# Patient Record
Sex: Female | Born: 1960 | Race: Black or African American | Hispanic: No | Marital: Married | Smoking: Never smoker
Health system: Southern US, Community
[De-identification: ages and names within clinical notes are randomized; demographics above are authoritative.]

## PROBLEM LIST (undated history)

## (undated) DIAGNOSIS — M3119 Other thrombotic microangiopathy: Secondary | ICD-10-CM

## (undated) DIAGNOSIS — R011 Cardiac murmur, unspecified: Secondary | ICD-10-CM

## (undated) DIAGNOSIS — E739 Lactose intolerance, unspecified: Secondary | ICD-10-CM

## (undated) DIAGNOSIS — M329 Systemic lupus erythematosus, unspecified: Secondary | ICD-10-CM

## (undated) DIAGNOSIS — R7689 Other specified abnormal immunological findings in serum: Secondary | ICD-10-CM

## (undated) DIAGNOSIS — G459 Transient cerebral ischemic attack, unspecified: Secondary | ICD-10-CM

## (undated) DIAGNOSIS — R768 Other specified abnormal immunological findings in serum: Secondary | ICD-10-CM

## (undated) DIAGNOSIS — M311 Thrombotic microangiopathy: Secondary | ICD-10-CM

## (undated) DIAGNOSIS — G4733 Obstructive sleep apnea (adult) (pediatric): Secondary | ICD-10-CM

## (undated) DIAGNOSIS — K219 Gastro-esophageal reflux disease without esophagitis: Secondary | ICD-10-CM

## (undated) DIAGNOSIS — IMO0002 Reserved for concepts with insufficient information to code with codable children: Secondary | ICD-10-CM

## (undated) DIAGNOSIS — T7840XA Allergy, unspecified, initial encounter: Secondary | ICD-10-CM

## (undated) DIAGNOSIS — E785 Hyperlipidemia, unspecified: Secondary | ICD-10-CM

## (undated) HISTORY — DX: Other thrombotic microangiopathy: M31.19

## (undated) HISTORY — DX: Transient cerebral ischemic attack, unspecified: G45.9

## (undated) HISTORY — DX: Lactose intolerance, unspecified: E73.9

## (undated) HISTORY — DX: Other specified abnormal immunological findings in serum: R76.8

## (undated) HISTORY — DX: Obstructive sleep apnea (adult) (pediatric): G47.33

## (undated) HISTORY — DX: Thrombotic microangiopathy: M31.1

## (undated) HISTORY — DX: Gastro-esophageal reflux disease without esophagitis: K21.9

## (undated) HISTORY — DX: Hyperlipidemia, unspecified: E78.5

## (undated) HISTORY — DX: Other specified abnormal immunological findings in serum: R76.89

## (undated) HISTORY — PX: KNEE ARTHROSCOPY: SUR90

## (undated) HISTORY — PX: NASAL SEPTUM SURGERY: SHX37

## (undated) HISTORY — DX: Allergy, unspecified, initial encounter: T78.40XA

## (undated) HISTORY — DX: Cardiac murmur, unspecified: R01.1

---

## 1985-05-15 HISTORY — PX: SPLENECTOMY: SUR1306

## 1999-05-25 ENCOUNTER — Other Ambulatory Visit: Admission: RE | Admit: 1999-05-25 | Discharge: 1999-05-25 | Payer: Self-pay | Admitting: Internal Medicine

## 1999-11-23 ENCOUNTER — Other Ambulatory Visit: Admission: RE | Admit: 1999-11-23 | Discharge: 1999-11-23 | Payer: Self-pay | Admitting: Gynecology

## 2000-02-18 ENCOUNTER — Emergency Department (HOSPITAL_COMMUNITY): Admission: EM | Admit: 2000-02-18 | Discharge: 2000-02-18 | Payer: Self-pay | Admitting: Emergency Medicine

## 2001-06-18 ENCOUNTER — Ambulatory Visit (HOSPITAL_COMMUNITY): Admission: RE | Admit: 2001-06-18 | Discharge: 2001-06-18 | Payer: Self-pay | Admitting: Internal Medicine

## 2001-06-18 ENCOUNTER — Encounter: Payer: Self-pay | Admitting: Internal Medicine

## 2002-09-22 ENCOUNTER — Other Ambulatory Visit: Admission: RE | Admit: 2002-09-22 | Discharge: 2002-09-22 | Payer: Self-pay | Admitting: Internal Medicine

## 2003-04-05 ENCOUNTER — Emergency Department (HOSPITAL_COMMUNITY): Admission: EM | Admit: 2003-04-05 | Discharge: 2003-04-05 | Payer: Self-pay | Admitting: Emergency Medicine

## 2003-04-16 ENCOUNTER — Encounter: Admission: RE | Admit: 2003-04-16 | Discharge: 2003-04-16 | Payer: Self-pay | Admitting: Internal Medicine

## 2003-05-01 ENCOUNTER — Ambulatory Visit (HOSPITAL_BASED_OUTPATIENT_CLINIC_OR_DEPARTMENT_OTHER): Admission: RE | Admit: 2003-05-01 | Discharge: 2003-05-01 | Payer: Self-pay | Admitting: Orthopedic Surgery

## 2003-06-02 ENCOUNTER — Inpatient Hospital Stay (HOSPITAL_COMMUNITY): Admission: RE | Admit: 2003-06-02 | Discharge: 2003-06-03 | Payer: Self-pay | Admitting: Gynecology

## 2004-05-15 HISTORY — PX: ABDOMINAL HYSTERECTOMY: SHX81

## 2004-08-01 ENCOUNTER — Other Ambulatory Visit: Admission: RE | Admit: 2004-08-01 | Discharge: 2004-08-01 | Payer: Self-pay | Admitting: Gynecology

## 2005-04-04 ENCOUNTER — Ambulatory Visit (HOSPITAL_COMMUNITY): Admission: RE | Admit: 2005-04-04 | Discharge: 2005-04-04 | Payer: Self-pay | Admitting: Internal Medicine

## 2005-11-07 ENCOUNTER — Other Ambulatory Visit: Admission: RE | Admit: 2005-11-07 | Discharge: 2005-11-07 | Payer: Self-pay | Admitting: Internal Medicine

## 2006-04-09 ENCOUNTER — Ambulatory Visit (HOSPITAL_COMMUNITY): Admission: RE | Admit: 2006-04-09 | Discharge: 2006-04-09 | Payer: Self-pay | Admitting: Internal Medicine

## 2012-08-23 ENCOUNTER — Encounter: Payer: Self-pay | Admitting: Family Medicine

## 2012-08-23 ENCOUNTER — Ambulatory Visit (INDEPENDENT_AMBULATORY_CARE_PROVIDER_SITE_OTHER): Payer: Managed Care, Other (non HMO) | Admitting: Family Medicine

## 2012-08-23 VITALS — BP 120/70 | HR 79 | Temp 98.5°F | Ht 68.75 in | Wt 216.0 lb

## 2012-08-23 DIAGNOSIS — Z9089 Acquired absence of other organs: Secondary | ICD-10-CM

## 2012-08-23 DIAGNOSIS — Z9081 Acquired absence of spleen: Secondary | ICD-10-CM

## 2012-08-23 DIAGNOSIS — Z7189 Other specified counseling: Secondary | ICD-10-CM

## 2012-08-23 DIAGNOSIS — K5900 Constipation, unspecified: Secondary | ICD-10-CM

## 2012-08-23 DIAGNOSIS — Z7689 Persons encountering health services in other specified circumstances: Secondary | ICD-10-CM

## 2012-08-23 DIAGNOSIS — K5909 Other constipation: Secondary | ICD-10-CM

## 2012-08-23 NOTE — Patient Instructions (Addendum)
-  PLEASE SIGN UP FOR MYCHART TODAY   We recommend the following healthy lifestyle measures: - eat a healthy diet consisting of lots of vegetables, fruits, beans, nuts, seeds, healthy meats such as white chicken and fish and whole grains.  - avoid fried foods, fast food, processed foods, sodas, red meet and other fattening foods.  - get a least 150 minutes of aerobic exercise per week.   Schedule mammogram  Obtain vaccine records  For constipation: -daily fiber supplement (such as metameucil) -if get stopped up use mirilax for a few days to get moving -get regular exercise  Follow up in: sometime in the next 2-3 months for physical exam

## 2012-08-23 NOTE — Progress Notes (Signed)
Chief Complaint  Patient presents with  . Establish Care    HPI:  Deanna [name] is here to establish care. Moved to [facility] June 2013 from [place]. Now has insurance and wants to get back under care of PCP. Want to get well women exams here. Last pap smear in 2012 was normal.   Has the following chronic problems and concerns today:  There is no problem list on file for this patient.  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  Past Medical History  Diagnosis Date  . Lactose intolerance     Family History  Problem Relation Age of Onset  . Arthritis Mother   . Hyperlipidemia Mother   . Stroke Mother   . Hypertension Mother     History   Social History  . Marital Status: Married    Spouse Name: N/A    Number of Children: N/A  . Years of Education: N/A   Social History Main Topics  . Smoking status: Never Smoker   . Smokeless tobacco: None  . Alcohol Use: No  . Drug Use: None  . Sexually Active: None   Other Topics Concern  . None   Social History Narrative   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 Situation: lives 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, husband and mother      Spiritual Beliefs: Christian      Lifestyle: walks a little a few days per week, diet is ok             Current outpatient prescriptions:Probiotic Product (PROBIOTIC & ACIDOPHILUS EX ST) CAPS, Take by mouth daily., Disp: , Rfl:   EXAM:  Filed Vitals:   08/23/12 1100  BP: 120/70  Pulse: 79  Temp: 98.5 F (36.9 C)    Body mass index is 32.14 kg/(m^2).  GENERAL: vitals reviewed and listed above, alert, oriented, appears well hydrated and in no acute distress  HEENT:  atraumatic, conjunttiva clear, no obvious abnormalities on inspection of external nose and ears  NECK: no obvious masses on inspection  LUNGS: clear to auscultation bilaterally, no wheezes, rales or rhonchi, good air movement  CV: HRRR, no peripheral edema  ABD: BS+, soft, NTTP  MS: moves all extremities without noticeable abnormality  PSYCH: pleasant and cooperative, no obvious depression or anxiety  ASSESSMENT AND PLAN:  Discussed the following assessment and plan:  Constipation, chronic  H/O splenectomy  Encounter to establish care  -We reviewed the PMH, PSH, FH, SH, Meds and Allergies. -discussed vaccines for s/p splenectomy and offered today - she thinks may have had them and will get vaccine records -advised physical with fasting labs and she will return for this - she reports she has had paps since hysterectomy -recs for constipation per below -follow up for physical with pap  -Patient advised to return or notify a doctor immediately if symptoms worsen or persist or new concerns arise.  Patient Instructions  -PLEASE SIGN UP FOR MYCHART TODAY   We recommend the following healthy lifestyle measures: - eat a healthy diet consisting of lots of vegetables, fruits, beans, nuts, seeds, healthy meats such as white chicken and fish and  whole grains.  - avoid fried foods, fast food, processed foods, sodas, red meet and other fattening foods.  - get a least 150 minutes of aerobic exercise per week.   Schedule mammogram  Obtain vaccine records  For constipation: -daily fiber supplement (such as metameucil) -if get stopped up use mirilax for a few days to get moving -get regular exercise  Follow up in: sometime in the next 2-3 months for physical exam      KIM, HANNAH R.

## 2012-08-27 ENCOUNTER — Other Ambulatory Visit: Payer: Self-pay | Admitting: Family Medicine

## 2012-08-27 DIAGNOSIS — Z1231 Encounter for screening mammogram for malignant neoplasm of breast: Secondary | ICD-10-CM

## 2012-08-30 ENCOUNTER — Ambulatory Visit (HOSPITAL_COMMUNITY)
Admission: RE | Admit: 2012-08-30 | Discharge: 2012-08-30 | Disposition: A | Discharge status: Home / Self Care | Payer: Managed Care, Other (non HMO) | Source: Ambulatory Visit | Attending: Family Medicine | Admitting: Family Medicine

## 2012-08-30 DIAGNOSIS — Z1231 Encounter for screening mammogram for malignant neoplasm of breast: Secondary | ICD-10-CM | POA: Insufficient documentation

## 2012-09-11 ENCOUNTER — Encounter: Payer: Self-pay | Admitting: Family Medicine

## 2012-09-11 DIAGNOSIS — Z862 Personal history of diseases of the blood and blood-forming organs and certain disorders involving the immune mechanism: Secondary | ICD-10-CM

## 2012-09-11 DIAGNOSIS — E785 Hyperlipidemia, unspecified: Secondary | ICD-10-CM

## 2012-09-11 DIAGNOSIS — E559 Vitamin D deficiency, unspecified: Secondary | ICD-10-CM

## 2012-09-11 NOTE — Progress Notes (Signed)
Received basic labs and EKGs from 2011 and 2012. Has Non-specific TW changes, HLD, Vit D def and mildly elevated platelets chronically. Scanned into chart.

## 2012-10-04 ENCOUNTER — Other Ambulatory Visit (HOSPITAL_COMMUNITY)
Admission: RE | Admit: 2012-10-04 | Discharge: 2012-10-04 | Disposition: A | Discharge status: Home / Self Care | Payer: Managed Care, Other (non HMO) | Source: Ambulatory Visit | Attending: Family Medicine | Admitting: Family Medicine

## 2012-10-04 ENCOUNTER — Encounter: Payer: Self-pay | Admitting: Family Medicine

## 2012-10-04 ENCOUNTER — Ambulatory Visit (INDEPENDENT_AMBULATORY_CARE_PROVIDER_SITE_OTHER): Payer: Managed Care, Other (non HMO) | Admitting: Family Medicine

## 2012-10-04 VITALS — BP 130/80 | Temp 98.4°F | Wt 213.0 lb

## 2012-10-04 DIAGNOSIS — Z299 Encounter for prophylactic measures, unspecified: Secondary | ICD-10-CM

## 2012-10-04 DIAGNOSIS — Z0000 Encounter for general adult medical examination without abnormal findings: Secondary | ICD-10-CM

## 2012-10-04 DIAGNOSIS — E559 Vitamin D deficiency, unspecified: Secondary | ICD-10-CM

## 2012-10-04 DIAGNOSIS — E785 Hyperlipidemia, unspecified: Secondary | ICD-10-CM

## 2012-10-04 DIAGNOSIS — L821 Other seborrheic keratosis: Secondary | ICD-10-CM

## 2012-10-04 DIAGNOSIS — Z862 Personal history of diseases of the blood and blood-forming organs and certain disorders involving the immune mechanism: Secondary | ICD-10-CM

## 2012-10-04 DIAGNOSIS — Z01419 Encounter for gynecological examination (general) (routine) without abnormal findings: Secondary | ICD-10-CM | POA: Insufficient documentation

## 2012-10-04 DIAGNOSIS — Z1151 Encounter for screening for human papillomavirus (HPV): Secondary | ICD-10-CM | POA: Insufficient documentation

## 2012-10-04 LAB — CBC WITH DIFFERENTIAL/PLATELET
Basophils Absolute: 0.1 10*3/uL (ref 0.0–0.1)
Basophils Relative: 0.9 % (ref 0.0–3.0)
Eosinophils Absolute: 0.1 10*3/uL (ref 0.0–0.7)
Eosinophils Relative: 1.3 % (ref 0.0–5.0)
HCT: 36 % (ref 36.0–46.0)
Hemoglobin: 12.2 g/dL (ref 12.0–15.0)
Lymphocytes Relative: 33.8 % (ref 12.0–46.0)
Lymphs Abs: 2.4 10*3/uL (ref 0.7–4.0)
MCHC: 33.9 g/dL (ref 30.0–36.0)
MCV: 85.7 fl (ref 78.0–100.0)
Monocytes Absolute: 0.7 10*3/uL (ref 0.1–1.0)
Monocytes Relative: 9.2 % (ref 3.0–12.0)
Neutro Abs: 3.9 10*3/uL (ref 1.4–7.7)
Neutrophils Relative %: 54.8 % (ref 43.0–77.0)
Platelets: 417 10*3/uL — ABNORMAL HIGH (ref 150.0–400.0)
RBC: 4.21 Mil/uL (ref 3.87–5.11)
RDW: 14.3 % (ref 11.5–14.6)
WBC: 7.2 10*3/uL (ref 4.5–10.5)

## 2012-10-04 LAB — LIPID PANEL
Cholesterol: 209 mg/dL — ABNORMAL HIGH (ref 0–200)
HDL: 44.1 mg/dL (ref >39.00)
Total CHOL/HDL Ratio: 5
Triglycerides: 79 mg/dL (ref 0.0–149.0)
VLDL: 15.8 mg/dL (ref 0.0–40.0)

## 2012-10-04 LAB — BASIC METABOLIC PANEL
BUN: 8 mg/dL (ref 6–23)
CO2: 28 mEq/L (ref 19–32)
Calcium: 9.4 mg/dL (ref 8.4–10.5)
Chloride: 104 mEq/L (ref 96–112)
Creatinine, Ser: 0.8 mg/dL (ref 0.4–1.2)
GFR: 93.7 mL/min (ref >60.00)
Glucose, Bld: 75 mg/dL (ref 70–99)
Potassium: 3.6 mEq/L (ref 3.5–5.1)
Sodium: 140 mEq/L (ref 135–145)

## 2012-10-04 LAB — HEMOGLOBIN A1C: Hgb A1c MFr Bld: 5.9 % (ref 4.6–6.5)

## 2012-10-04 LAB — LDL CHOLESTEROL, DIRECT: Direct LDL: 150.4 mg/dL

## 2012-10-04 NOTE — Patient Instructions (Signed)
-  We have ordered labs or studies at this visit. It can take up to 1-2 weeks for results and processing. We will contact you with instructions IF your results are abnormal. Normal results will be released to your [facility]. If you have not heard from [place] or can not find your results in [facility] in 2 weeks please contact our office.  -PLEASE SIGN UP FOR MYCHART TODAY   We recommend the following healthy lifestyle measures: - eat a healthy diet consisting of lots of vegetables, fruits, beans, nuts, seeds, healthy meats such as white chicken and fish and whole grains.  - avoid fried foods, fast food, processed foods, sodas, red meet and other fattening foods.  - get a least 150 minutes of aerobic exercise per week.   Take vitamin D 1000 IU daily  Follow up in: 1 year or sooner if needed

## 2012-10-04 NOTE — Progress Notes (Signed)
Chief Complaint  Patient presents with  . Annual Exam    HPI:  Here for CPE:  -Concerns today:   1) few moles she wants [place] to look at: -right leg and R side -feels like mole on R side has changed some  2) wants labs, hx HLD and Vit D def  -Diet: trying to eat healthy, lots of fruits  -Calcium and Vit D: hx vit D def,does not take any vitamin D  -Exercise: walks a few days per week  -Diabetes and Dyslipidemia Screening: Hx HLD mild  -Hx of HTN: no  -Vaccines: UTD  -pap history: last in 2012 and normal per her report, of note she is s/p hysterectomy for fibroids but reports has still gotten paps, does not know if has cervix, paps have all been normal, no hx abnormal paps  -FDLMP: N/A  -sexual activity: yes, female partner, no new partners  -wants STI testing: no  -FH breast, colon or ovarian ca: see FH -had mammo 08/2012  -Alcohol, Tobacco, drug use: see social history  Review of Systems - vision changes, unintentional weight loss, fevers, CP, SOB, DOE, pain, changes in bowels, dysuria, vaginal discharge, bleeding, skin rashes, syncope, depression  Past Medical History  Diagnosis Date  . Lactose intolerance     Family History  Problem Relation Age of Onset  . Arthritis Mother   . Hyperlipidemia Mother   . Stroke Mother   . Hypertension Mother     History   Social History  . Marital Status: Married    Spouse Name: N/A    Number of Children: N/A  . Years of Education: N/A   Social History Main Topics  . Smoking status: Never Smoker   . Smokeless tobacco: None  . Alcohol Use: No  . Drug Use: None  . Sexually Active: None   Other Topics Concern  . None   Social History Narrative   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 Situation: lives 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, husband and mother      Spiritual Beliefs: Christian      Lifestyle: walks a little a few days per week, diet is ok             Current outpatient  prescriptions:Multiple Vitamins-Minerals (ULTRA MEGA PO), Take by mouth. Green ; Whole Food Enhanced multivitamin, Disp: , Rfl: ;  Probiotic Product (PROBIOTIC & ACIDOPHILUS EX ST) CAPS, Take by mouth daily., Disp: , Rfl:   EXAM:  Filed Vitals:   10/04/12 1124  BP: 130/80  Temp: 98.4 F (36.9 C)    GENERAL: vitals reviewed and listed below, alert, oriented, appears well hydrated and in no acute distress  HEENT: head atraumatic, PERRLA, normal appearance of eyes, ears, nose and mouth. moist mucus membranes.  NECK: supple, no masses or lymphadenopathy  LUNGS: clear to auscultation bilaterally, no rales, rhonchi or wheeze  CV: HRRR, no peripheral edema or cyanosis, normal pedal pulses  BREAST: normal appearance - no lesions or discharge, on palpation normal breast tissue without any suspicious masses  ABDOMEN: bowel sounds normal, soft, non tender to palpation, no masses, no rebound or guarding  GU: normal appearance of external genitalia - no lesions or masses, normal vaginal mucosa - no abnormal discharge, appears to have possible cervix or partial cervix R - ap obtained  RECTAL: refused  SKIN: no rash or abnormal lesions, SK side and leg  MS: normal gait, moves all extremities normally  NEURO: CN II-XII grossly  intact, normal muscle strength and sensation to light touch on extremities  PSYCH: normal affect, pleasant and cooperative  ASSESSMENT AND PLAN:  Discussed the following assessment and plan:  Preventive measure - Plan: Lipid Panel, Hemoglobin A1c, Basic metabolic panel  Seborrheic keratosis  Unspecified vitamin D deficiency - Plan: Vitamin D, 25-hydroxy  Hyperlipemia - from 2011, 2012 labs - Plan: Lipid Panel  History of high platelet count - on labs 2011, 2012 - Plan: CBC with Differential  -Discussed and advised all [place] preventive services health task force level A and B recommendations for age, sex and risks.  -FASTING LABS  -offered tx for SKs,  deferred  -Advised at least 150 minutes of exercise per week and a healthy diet low in saturated fats and sweets and consisting of fresh fruits and vegetables, lean meats such as fish and white chicken and whole grains.  -labs, studies and vaccines per orders this encounter  Orders Placed This Encounter  Procedures  . Lipid Panel  . Hemoglobin A1c  . Vitamin D, 25-hydroxy  . CBC with Differential  . Basic metabolic panel    Patient Instructions  -We have ordered labs or studies at this visit. It can take up to 1-2 weeks for results and processing. We will contact you with instructions IF your results are abnormal. Normal results will be released to your [facility]. If you have not heard from [place] or can not find your results in [facility] in 2 weeks please contact our office.  -PLEASE SIGN UP FOR MYCHART TODAY   We recommend the following healthy lifestyle measures: - eat a healthy diet consisting of lots of vegetables, fruits, beans, nuts, seeds, healthy meats such as white chicken and fish and whole grains.  - avoid fried foods, fast food, processed foods, sodas, red meet and other fattening foods.  - get a least 150 minutes of aerobic exercise per week.   Take vitamin D 1000 IU daily  Follow up in: 1 year or sooner if needed     Patient advised to return to clinic immediately if symptoms worsen or persist or new concerns.    Return in about 1 year (around 10/04/2013), or if symptoms worsen or fail to improve.  Kriste Basque R.

## 2012-10-05 LAB — VITAMIN D 25 HYDROXY (VIT D DEFICIENCY, FRACTURES): Vit D, 25-Hydroxy: 20 ng/mL — ABNORMAL LOW (ref 30–89)

## 2012-10-08 NOTE — Progress Notes (Signed)
Quick Note:  Called and spoke with pt and pt is aware. Pt will have to call office back to schedule f/u. ______

## 2012-10-11 NOTE — Progress Notes (Signed)
Quick Note:  Left a message for pt to return call. ______ 

## 2012-10-15 NOTE — Progress Notes (Signed)
Quick Note:  Called and spoke with pt and pt is aware. ______ 

## 2013-02-28 ENCOUNTER — Encounter: Payer: Self-pay | Admitting: Internal Medicine

## 2013-02-28 ENCOUNTER — Ambulatory Visit (INDEPENDENT_AMBULATORY_CARE_PROVIDER_SITE_OTHER): Payer: Managed Care, Other (non HMO) | Admitting: Internal Medicine

## 2013-02-28 VITALS — BP 130/80 | HR 65 | Temp 98.5°F | Resp 20 | Wt 216.0 lb

## 2013-02-28 DIAGNOSIS — K120 Recurrent oral aphthae: Secondary | ICD-10-CM

## 2013-02-28 MED ORDER — FIRST-DUKES MOUTHWASH MT SUSP: 5.0000 mL | Freq: Four times a day (QID) | OROMUCOSAL | Status: DC | PRN

## 2013-02-28 NOTE — Progress Notes (Signed)
Subjective:    Patient ID: Deanna [name], female    DOB: [birth year], 52 y.o.   MRN: [id number]  HPI  52 year old patient who presents with a one-week history of some discomfort involving her left facial area and ear. She was concerned about a possible ear infection. No fever or other constitutional complaints. She is a nonsmoker. She has had some fever blisters in the past involving her nose. Past Medical History  Diagnosis Date  . Lactose intolerance     History   Social History  . Marital Status: Married    Spouse Name: N/A    Number of Children: N/A  . Years of Education: N/A   Occupational History  . Not on file.   Social History Main Topics  . Smoking status: Never Smoker   . Smokeless tobacco: Not on file  . Alcohol Use: No  . Drug Use: Not on file  . Sexual Activity: Not on file   Other Topics Concern  . 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 Situation: lives 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, husband and mother      Spiritual Beliefs: Christian      Lifestyle: walks a little a few days per week, diet is ok             Past Surgical History  Procedure Laterality Date  . 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 Abdominal hysterectomy  2006    benign tumor, complete  . Knee surgery      Family History  Problem Relation Age of Onset  . Arthritis Mother   . Hyperlipidemia Mother   . Stroke Mother   . Hypertension Mother     Allergies  Allergen Reactions  . Eggs Or Egg-Derived Products Nausea And Vomiting    Current Outpatient Prescriptions on File Prior to Visit  Medication Sig Dispense Refill  . Multiple Vitamins-Minerals (ULTRA MEGA PO) Take by mouth. Green ; Whole Food Enhanced multivitamin      . Probiotic Product (PROBIOTIC & ACIDOPHILUS EX ST) CAPS Take by mouth daily.       No current facility-administered medications on file prior to visit.    BP 130/80  Pulse 65  Temp(Src)  98.5 F (36.9 C) (Oral)  Resp 20  Wt 216 lb (97.977 kg)  BMI 32.14 kg/m2  SpO2 98%        Review of Systems  Constitutional: Negative.   HENT: Positive for ear pain. Negative for congestion, dental problem, hearing loss, rhinorrhea, sinus pressure, sore throat and tinnitus.   Eyes: Negative for pain, discharge and visual disturbance.  Respiratory: Negative for cough and shortness of breath.   Cardiovascular: Negative for chest pain, palpitations and leg swelling.  Gastrointestinal: Negative for nausea, vomiting, abdominal pain, diarrhea, constipation, blood in stool and abdominal distention.  Genitourinary: Negative for dysuria, urgency, frequency, hematuria, flank pain, vaginal bleeding, vaginal discharge, difficulty urinating, vaginal pain and pelvic pain.  Musculoskeletal: Negative for arthralgias, gait problem and joint swelling.  Skin: Negative for rash.  Neurological: Negative for dizziness, syncope, speech difficulty, weakness, numbness and headaches.  Hematological: Negative for adenopathy.  Psychiatric/Behavioral: Negative for behavioral problems, dysphoric mood and agitation. The patient is not nervous/anxious.        Objective:   Physical Exam  Constitutional: She is oriented to person, place, and time. She appears well-developed and well-nourished.  HENT:  Head: Normocephalic.  Right Ear: External ear normal.  Left Ear: External ear normal.  Mouth/Throat: Oropharynx is clear and moist.  Small aphthous ulcer underneath the left lateral tongue area  Eyes: Conjunctivae and EOM are normal. Pupils are equal, round, and reactive to light.  Neck: Normal range of motion. Neck supple. No thyromegaly present.  Shoddy mildly tender left submandibular adenopathy  Cardiovascular: Normal rate, regular rhythm, normal heart sounds and intact distal pulses.   Pulmonary/Chest: Effort normal and breath sounds normal.  Abdominal: Soft. Bowel sounds are normal. She exhibits no mass.  There is no tenderness.  Musculoskeletal: Normal range of motion.  Lymphadenopathy:    She has cervical adenopathy.  Neurological: She is alert and oriented to person, place, and time.  Skin: Skin is warm and dry. No rash noted.  Psychiatric: She has a normal mood and affect. Her behavior is normal.          Assessment & Plan:  Canker sores- will treat symptomatically

## 2013-02-28 NOTE — Patient Instructions (Signed)
Canker Sores   Canker sores are painful, open sores on the inside of the mouth and cheek. They may be white or yellow. The sores usually heal in 1 to 2 weeks. Women are more likely than men to have recurrent canker sores.  CAUSES  The cause of canker sores is not well understood. More than one cause is likely. Canker sores do not appear to be caused by certain types of germs (viruses or bacteria). Canker sores may be caused by:   An allergic reaction to certain foods.   Digestive problems.   Not having enough vitamin B12, folic acid, and iron.   Female sex hormones. Sores may come only during certain phases of a menstrual cycle. Often, there is improvement during pregnancy.   Genetics. Some people seem to inherit canker sore problems.  Emotional stress and injuries to the mouth may trigger outbreaks, but not cause them.   DIAGNOSIS  Canker sores are diagnosed by exam.   TREATMENT   Patients who have frequent bouts of canker sores may have cultures taken of the sores, blood tests, or allergy tests. This helps determine if their sores are caused by a poor diet, an allergy, or some other preventable or treatable disease.   Vitamins may prevent recurrences or reduce the severity of canker sores in people with poor nutrition.   Numbing ointments can relieve pain. These are available in drug stores without a prescription.   Anti-inflammatory steroid mouth rinses or gels may be prescribed by your caregiver for severe sores.   Oral steroids may be prescribed if you have severe, recurrent canker sores. These strong medicines can cause many side effects and should be used only under the close direction of a dentist or physician.   Mouth rinses containing the antibiotic medicine may be prescribed. They may lessen symptoms and speed healing.  Healing usually happens in about 1 or 2 weeks with or without treatment. Certain antibiotic mouth rinses given to pregnant women and young children can permanently stain teeth.  Talk to your caregiver about your treatment.  HOME CARE INSTRUCTIONS    Avoid foods that cause canker sores for you.   Avoid citrus juices, spicy or salty foods, and coffee until the sores are healed.   Use a soft-bristled toothbrush.   Chew your food carefully to avoid biting your cheek.   Apply topical numbing medicine to the sore to help relieve pain.   Apply a thin paste of baking soda and water to the sore to help heal the sore.   Only use mouth rinses or medicines for pain or discomfort as directed by your caregiver.  SEEK MEDICAL CARE IF:    Your symptoms are not better in 1 week.   Your sores are still present after 2 weeks.   Your sores are very painful.   You have trouble breathing or swallowing.   Your sores come back frequently.  Document Released: 08/26/2010 Document Revised: 07/24/2011 Document Reviewed: 08/26/2010  [employer] Patient Information 2014 [employer].

## 2013-03-20 ENCOUNTER — Other Ambulatory Visit: Payer: Self-pay

## 2013-04-29 ENCOUNTER — Ambulatory Visit (INDEPENDENT_AMBULATORY_CARE_PROVIDER_SITE_OTHER): Payer: Managed Care, Other (non HMO) | Admitting: Family Medicine

## 2013-04-29 VITALS — BP 108/74 | HR 81 | Temp 98.8°F | Wt 218.0 lb

## 2013-04-29 DIAGNOSIS — J329 Chronic sinusitis, unspecified: Secondary | ICD-10-CM

## 2013-04-29 MED ORDER — AMOXICILLIN 875 MG PO TABS: 875.0000 mg | ORAL_TABLET | Freq: Two times a day (BID) | ORAL | Status: DC

## 2013-04-29 NOTE — Progress Notes (Signed)
Chief Complaint  Patient presents with  . Cough    congestion, hoarseness, not sleeping due to cough, sinus pain and pressure x 2 weeks     HPI:  -started: 2 weeks ago -symptoms:nasal congestion, sore throat, cough, sinus pain and pressure - not getting better -denies:fever, SOB, NVD, tooth pain -has tried: OTC remedies -sick contacts/travel/risks: denies flu exposure, tick exposure or or Ebola risks   ROS: See pertinent positives and negatives per HPI.  Past Medical History  Diagnosis Date  . Lactose intolerance     Past Surgical History  Procedure Laterality Date  . 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 Abdominal hysterectomy  2006    benign tumor, complete  . Knee surgery      Family History  Problem Relation Age of Onset  . Arthritis Mother   . Hyperlipidemia Mother   . Stroke Mother   . Hypertension Mother     History   Social History  . Marital Status: Married    Spouse Name: N/A    Number of Children: N/A  . Years of Education: N/A   Social History Main Topics  . Smoking status: Never Smoker   . Smokeless tobacco: Not on file  . Alcohol Use: No  . Drug Use: Not on file  . Sexual Activity: Not on file   Other Topics Concern  . 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 Situation: lives 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, husband and mother      Spiritual Beliefs: Christian      Lifestyle: walks a little a few days per week, diet is ok             Current outpatient prescriptions:Diphenhyd-Hydrocort-Nystatin (FIRST-DUKES MOUTHWASH) SUSP, Use as directed 5 mLs in the mouth or throat every 6 (six) hours as needed., Disp: 237 mL, Rfl: 1;  Multiple Vitamins-Minerals (ULTRA MEGA PO), Take by mouth. Green ; Whole Food Enhanced multivitamin, Disp: , Rfl: ;  Probiotic Product (PROBIOTIC & ACIDOPHILUS EX ST) CAPS, Take by mouth daily., Disp: , Rfl:  amoxicillin (AMOXIL) 875 MG tablet, Take 1 tablet (875  mg total) by mouth 2 (two) times daily., Disp: 20 tablet, Rfl: 0  EXAM:  Filed Vitals:   04/29/13 1410  BP: 108/74  Pulse: 81  Temp: 98.8 F (37.1 C)    Body mass index is 32.44 kg/(m^2).  GENERAL: vitals reviewed and listed above, alert, oriented, appears well hydrated and in no acute distress  HEENT: atraumatic, conjunttiva clear, no obvious abnormalities on inspection of external nose and ears, normal appearance of ear canals and TMs, clear nasal congestion, mild post oropharyngeal erythema with PND, no tonsillar edema or exudate, no sinus TTP  NECK: no obvious masses on inspection  LUNGS: clear to auscultation bilaterally, no wheezes, rales or rhonchi, good air movement  CV: HRRR, no peripheral edema  MS: moves all extremities without noticeable abnormality  PSYCH: pleasant and cooperative, no obvious depression or anxiety  ASSESSMENT AND PLAN:  Discussed the following assessment and plan:  Sinusitis - Plan: amoxicillin (AMOXIL) 875 MG tablet  -given HPI and exam findings today, a serious infection or illness is unlikely. We discussed treatment side effects, likely course, antibiotic misuse, transmission, and signs of developing a serious illness. -of course, we advised to return or notify a doctor immediately if symptoms worsen or persist or new concerns arise.    There  are no Patient Instructions on file for this visit.   Colin Benton R.

## 2013-09-16 ENCOUNTER — Other Ambulatory Visit: Payer: Self-pay | Admitting: Family Medicine

## 2013-09-16 DIAGNOSIS — Z1231 Encounter for screening mammogram for malignant neoplasm of breast: Secondary | ICD-10-CM

## 2013-09-18 ENCOUNTER — Ambulatory Visit (HOSPITAL_COMMUNITY)
Admission: RE | Admit: 2013-09-18 | Discharge: 2013-09-18 | Disposition: A | Discharge status: Home / Self Care | Payer: Managed Care, Other (non HMO) | Source: Ambulatory Visit | Attending: Family Medicine | Admitting: Family Medicine

## 2013-09-18 DIAGNOSIS — Z1231 Encounter for screening mammogram for malignant neoplasm of breast: Secondary | ICD-10-CM | POA: Insufficient documentation

## 2013-09-22 ENCOUNTER — Other Ambulatory Visit: Payer: Self-pay | Admitting: Family Medicine

## 2013-09-22 DIAGNOSIS — R928 Other abnormal and inconclusive findings on diagnostic imaging of breast: Secondary | ICD-10-CM

## 2013-10-02 ENCOUNTER — Ambulatory Visit
Admission: RE | Admit: 2013-10-02 | Discharge: 2013-10-02 | Disposition: A | Discharge status: Home / Self Care | Payer: Managed Care, Other (non HMO) | Source: Ambulatory Visit | Attending: Family Medicine | Admitting: Family Medicine

## 2013-10-02 DIAGNOSIS — R928 Other abnormal and inconclusive findings on diagnostic imaging of breast: Secondary | ICD-10-CM

## 2014-01-27 ENCOUNTER — Ambulatory Visit (INDEPENDENT_AMBULATORY_CARE_PROVIDER_SITE_OTHER): Payer: Managed Care, Other (non HMO) | Admitting: Family Medicine

## 2014-01-27 ENCOUNTER — Encounter: Payer: Self-pay | Admitting: Family Medicine

## 2014-01-27 VITALS — BP 122/84 | HR 63 | Temp 98.4°F | Ht 68.25 in | Wt 220.0 lb

## 2014-01-27 DIAGNOSIS — M79609 Pain in unspecified limb: Secondary | ICD-10-CM

## 2014-01-27 DIAGNOSIS — E669 Obesity, unspecified: Secondary | ICD-10-CM | POA: Insufficient documentation

## 2014-01-27 DIAGNOSIS — E559 Vitamin D deficiency, unspecified: Secondary | ICD-10-CM

## 2014-01-27 DIAGNOSIS — M79644 Pain in right finger(s): Secondary | ICD-10-CM

## 2014-01-27 DIAGNOSIS — Z0000 Encounter for general adult medical examination without abnormal findings: Secondary | ICD-10-CM

## 2014-01-27 DIAGNOSIS — R7309 Other abnormal glucose: Secondary | ICD-10-CM

## 2014-01-27 DIAGNOSIS — E785 Hyperlipidemia, unspecified: Secondary | ICD-10-CM

## 2014-01-27 DIAGNOSIS — R739 Hyperglycemia, unspecified: Secondary | ICD-10-CM | POA: Insufficient documentation

## 2014-01-27 LAB — BASIC METABOLIC PANEL
BUN: 7 mg/dL (ref 6–23)
CO2: 30 mEq/L (ref 19–32)
Calcium: 9.4 mg/dL (ref 8.4–10.5)
Chloride: 103 mEq/L (ref 96–112)
Creatinine, Ser: 0.8 mg/dL (ref 0.4–1.2)
GFR: 98.67 mL/min (ref >60.00)
Glucose, Bld: 72 mg/dL (ref 70–99)
Potassium: 3.8 mEq/L (ref 3.5–5.1)
Sodium: 140 mEq/L (ref 135–145)

## 2014-01-27 LAB — LIPID PANEL
Cholesterol: 207 mg/dL — ABNORMAL HIGH (ref 0–200)
HDL: 35.3 mg/dL — ABNORMAL LOW (ref >39.00)
LDL Cholesterol: 143 mg/dL — ABNORMAL HIGH (ref 0–99)
NonHDL: 171.7
Total CHOL/HDL Ratio: 6
Triglycerides: 143 mg/dL (ref 0.0–149.0)
VLDL: 28.6 mg/dL (ref 0.0–40.0)

## 2014-01-27 LAB — HEMOGLOBIN A1C: Hgb A1c MFr Bld: 6 % (ref 4.6–6.5)

## 2014-01-27 NOTE — Patient Instructions (Addendum)
-  We have ordered labs or studies at this visit. It can take up to 1-2 weeks for results and processing. We will contact you with instructions IF your results are abnormal. Normal results will be released to your [facility]. If you have not heard from [place] or can not find your results in [facility] in 2 weeks please contact our office.  We recommend the following healthy lifestyle measures: - eat a healthy diet consisting of lots of vegetables, fruits, beans, nuts, seeds, healthy meats such as white chicken and fish and whole grains.  - avoid fried foods, fast food, processed foods, sodas, red meet and other fattening foods.  - get a least 150 minutes of aerobic exercise per week.   Cock up brace for the R wrist at night and during the day if tolerated. Take brakes frequently at work and ensure desk is at proper height  -We placed a referral for you as discussed for the colonoscopy. It usually takes about 1-2 weeks to process and schedule this referral. If you have not heard from [place] regarding this appointment in 2 weeks please contact our office.   Follow up in: 3-6 months or as needed

## 2014-01-27 NOTE — Progress Notes (Signed)
No chief complaint on file.   HPI:  Here for CPE:  -Concerns and/or follow up today:   1)Prediabetes/HLD: -diet and exercise advised -Diet: variety of foods, balance and well rounded, larger portion sizes -Exercise: walking 30 minutes twice per week; diet is healthy -denies:polyuria, polydipsia, vision changes  2)Vit D insuffieciency: -advised oral Vit D -reports: not taking vit D -denies: muscle pain, fatigue, kidney stones  3)R thumb and forearm pain: -achy pain for about 1 month -worse when she wakes up and after long day at work -no weakness or numbness -does a lot of computer work, r handed  -Taking folic acid, vitamin D or calcium: no  -Diabetes and Dyslipidemia Screening: doing today  -Hx of HTN: no  -Vaccines: UTD  -pap history:  last pap 09/2012 incomplete - however on further review of chart had complete hysterectomy so pap not indicated - she reports her prior gynecologist had advised did not need paps.  -FDLMP: N/A  -sexual activity: yes, female partner, no new partners  -wants STI testing: no  -FH breast, colon or ovarian ca: see FH Last mammogram: 09/2014 Last colon cancer screening: wants to get referral for colonoscopy  Breast Ca Risk Assessment: -no personal or FH of breast cancer or ovarian cancer  -Alcohol, Tobacco, drug use: see social history  Review of Systems - no fevers, unintentional weight loss, vision loss, hearing loss, chest pain, sob, hemoptysis, melena, hematochezia, hematuria, genital discharge, changing or concerning skin lesions, bleeding, bruising, loc, thoughts of self harm or SI  Past Medical History  Diagnosis Date  . Lactose intolerance     Past Surgical History  Procedure Laterality Date  . 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 Abdominal hysterectomy  2006    benign tumor, complete  . Knee surgery      Family History  Problem Relation Age of Onset  . Arthritis Mother   . Hyperlipidemia Mother   . Stroke  Mother   . Hypertension Mother     History   Social History  . Marital Status: Married    Spouse Name: N/A    Number of Children: N/A  . Years of Education: N/A   Social History Main Topics  . Smoking status: Never Smoker   . Smokeless tobacco: None  . Alcohol Use: No  . Drug Use: None  . Sexual Activity: None   Other Topics Concern  . None   Social History Narrative   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: lives 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, husband and mother      Spiritual Beliefs: Christian      Lifestyle: walks a little a few days per week, diet is ok             Current outpatient prescriptions:Probiotic Product (PROBIOTIC & ACIDOPHILUS EX ST) CAPS, Take by mouth daily., Disp: , Rfl:   EXAM:  Filed Vitals:   01/27/14 1143  BP: 122/84  Pulse: 63  Temp: 98.4 F (36.9 C)    GENERAL: vitals reviewed and listed below, alert, oriented, appears well hydrated and in no acute distress  HEENT: head atraumatic, PERRLA, normal appearance of eyes, ears, nose and mouth. moist mucus membranes.  NECK: supple, no masses or lymphadenopathy  LUNGS: clear to auscultation bilaterally, no rales, rhonchi or wheeze  CV: HRRR, no peripheral edema or cyanosis, normal pedal pulses  BREAST: declined  ABDOMEN: bowel sounds normal, soft, non tender to palpation, no  masses, no rebound or guarding  GU: declined  RECTAL: refused  SKIN: no rash or abnormal lesions  MS: normal gait, moves all extremities normally  NEURO: CN II-XII grossly intact, normal muscle strength and sensation to light touch on extremities  PSYCH: normal affect, pleasant and cooperative  ASSESSMENT AND PLAN:  Discussed the following assessment and plan:  1. Visit for preventive health examination -Discussed and advised all [place] preventive services health task force level A and B recommendations for age, sex and risks. -Advised at least 150 minutes of exercise per week and a  healthy diet low in saturated fats and sweets and consisting of fresh fruits and vegetables, lean meats such as fish and white chicken and whole grains. 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 Unspecified vitamin D deficiency -advised Vt D3 1000 IU daily  3. Hyperlipemia - from 2011, 2012 labs -FASTING labs today -healthy diet and exercise  4. Hyperglycemia -labs today -healthy diet and exercise  5. Thumb pain, right -likely CTS -wrist cock up brace applied and fitted -advised per instructions  7. Obesity -healthy diet and regular exercise advise    Orders Placed This Encounter  Procedures  . Lipid Panel  . Basic metabolic panel  . Hemoglobin A1c  . Ambulatory referral to Gastroenterology    Referral Priority:  Routine    Referral Type:  Consultation    Referral Reason:  Specialty Services Required    Requested Specialty:  Gastroenterology    Number of Visits Requested:  1    Patient advised to return to clinic immediately if symptoms worsen or persist or new concerns.  Patient Instructions  -We have ordered labs or studies at this visit. It can take up to 1-2 weeks for results and processing. We will contact you with instructions IF your results are abnormal. Normal results will be released to your [facility]. If you have not heard from [place] or can not find your results in [facility] in 2 weeks please contact our office.  We recommend the following healthy lifestyle measures: - eat a healthy diet consisting of lots of vegetables, fruits, beans, nuts, seeds, healthy meats such as white chicken and fish and whole grains.  - avoid fried foods, fast food, processed foods, sodas, red meet and other fattening foods.  - get a  least 150 minutes of aerobic exercise per week.   Cock up brace for the R wrist at night and during the day if tolerated. Take brakes frequently at work and ensure desk is at proper height  -We placed a referral for you as discussed for the colonoscopy. It usually takes about 1-2 weeks to process and schedule this referral. If you have not heard from [place] regarding this appointment in 2 weeks please contact our office.   Follow up in: 3-6 months or as needed     No Follow-up on file.  Colin Benton R.

## 2014-01-27 NOTE — Progress Notes (Signed)
Pre visit review using our clinic review tool, if applicable. No additional management support is needed unless otherwise documented below in the visit note. 

## 2014-01-29 ENCOUNTER — Encounter: Payer: Self-pay | Admitting: Internal Medicine

## 2014-02-05 ENCOUNTER — Encounter: Payer: Self-pay | Admitting: Family Medicine

## 2014-03-06 ENCOUNTER — Ambulatory Visit (AMBULATORY_SURGERY_CENTER): Payer: Self-pay | Admitting: *Deleted

## 2014-03-06 VITALS — Ht 69.0 in | Wt 218.8 lb

## 2014-03-06 DIAGNOSIS — Z1211 Encounter for screening for malignant neoplasm of colon: Secondary | ICD-10-CM

## 2014-03-06 NOTE — Progress Notes (Signed)
EGG ALLERGY: CAUSES NAUSEA AND VOMITING  No allergy to soy No problems with anesthesia.  Pt given Emmi instructions for colonoscopy  No oxygen use  No diet drug use

## 2014-03-11 ENCOUNTER — Encounter: Payer: Self-pay | Admitting: Internal Medicine

## 2014-03-20 ENCOUNTER — Encounter: Payer: Self-pay | Admitting: Internal Medicine

## 2014-03-20 ENCOUNTER — Ambulatory Visit (AMBULATORY_SURGERY_CENTER): Payer: Managed Care, Other (non HMO) | Admitting: Internal Medicine

## 2014-03-20 VITALS — BP 113/75 | HR 59 | Temp 97.3°F | Resp 11 | Ht 69.0 in | Wt 218.0 lb

## 2014-03-20 DIAGNOSIS — Z1211 Encounter for screening for malignant neoplasm of colon: Secondary | ICD-10-CM

## 2014-03-20 DIAGNOSIS — K5730 Diverticulosis of large intestine without perforation or abscess without bleeding: Secondary | ICD-10-CM

## 2014-03-20 MED ORDER — SODIUM CHLORIDE 0.9 % IV SOLN: 500.0000 mL | INTRAVENOUS | Status: DC

## 2014-03-20 NOTE — Op Note (Signed)
[facility]  [address] [place], [address]   COLONOSCOPY PROCEDURE REPORT  PATIENT: Deanna [name], Deanna [name]  MR#: [id number] BIRTHDATE: 10-04-1960 , 53  yrs. old GENDER: female ENDOSCOPIST: Gatha Mayer, MD, [facility] PROCEDURE DATE:  03/20/2014 PROCEDURE:   Colonoscopy, screening First Screening Colonoscopy - Avg.  risk and is 50 yrs.  old or older Yes.  Prior Negative Screening - Now for repeat screening. N/A  History of Adenoma - Now for follow-up colonoscopy & has been > or = to 3 yrs.  N/A  Polyps Removed Today? No.  Polyps Removed Today? No.  Recommend repeat exam, <10 yrs? Polyps Removed Today? No.  Recommend repeat exam, <10 yrs? No. ASA CLASS:   Class II INDICATIONS:average risk for colorectal cancer and first colonoscopy. MEDICATIONS: Propofol 340 mg IV and Monitored anesthesia care  DESCRIPTION OF PROCEDURE:   After the risks benefits and alternatives of the procedure were thoroughly explained, informed consent was obtained.  The digital rectal exam revealed no abnormalities of the rectum.   The LB [id number]  endoscope was introduced through the anus and advanced to the cecum, which was identified by both the appendix and ileocecal valve. No adverse events experienced.   The quality of the prep was good, using MiraLax  The instrument was then slowly withdrawn as the colon was fully examined.      COLON FINDINGS: There was diverticulosis noted throughout the entire examined colon.   The examination was otherwise normal. Retroflexed views revealed no abnormalities. The time to cecum=4 minutes 07 seconds.  Withdrawal time=12 minutes 29 seconds.  The scope was withdrawn and the procedure completed. COMPLICATIONS: There were no immediate complications.  ENDOSCOPIC IMPRESSION: 1.   Diverticulosis was noted throughout the entire examined colon 2.   The examination was otherwise normal  RECOMMENDATIONS: Repeat colonoscopy 10 years -  2025  eSigned:  Gatha Mayer, MD, [facility] 03/20/2014 9:06 AM   cc: Colin Benton, DO and The Patient

## 2014-03-20 NOTE — Progress Notes (Signed)
Procedure ends, to recovery, report given and VSS. 

## 2014-03-20 NOTE — Progress Notes (Signed)
Patient stating she found out about the egg allergy through allergy testing. Patient stating eggs cause hand and feet swelling. Patient never eats eggs except in baked goods. Patient stating she does not eat products that she knows contains 4-5 eggs. Discussed with Holland Falling CRNA. Will proceed with anesthesia as planned.

## 2014-03-20 NOTE — Patient Instructions (Addendum)
No polyps or cancer detected! You also have a condition called diverticulosis - common and not usually a problem. Please read the handout provided.  Next routine colonoscopy in 10 years - 2025  I appreciate the opportunity to care for you. Gatha Mayer, MD, FACG  YOU HAD AN ENDOSCOPIC PROCEDURE TODAY AT [facility]: Refer to the procedure report that was given to you for any specific questions about what was found during the examination.  If the procedure report does not answer your questions, please call your gastroenterologist to clarify.  If you requested that your care partner not be given the details of your procedure findings, then the procedure report has been included in a sealed envelope for you to review at your convenience later.  YOU SHOULD EXPECT: Some feelings of bloating in the abdomen. Passage of more gas than usual.  Walking can help get rid of the air that was put into your GI tract during the procedure and reduce the bloating. If you had a lower endoscopy (such as a colonoscopy or flexible sigmoidoscopy) you may notice spotting of blood in your stool or on the toilet paper. If you underwent a bowel prep for your procedure, then you may not have a normal bowel movement for a few days.  DIET: Your first meal following the procedure should be a light meal and then it is ok to progress to your normal diet.  A half-sandwich or bowl of soup is an example of a good first meal.  Heavy or fried foods are harder to digest and may make you feel nauseous or bloated.  Likewise meals heavy in dairy and vegetables can cause extra gas to form and this can also increase the bloating.  Drink plenty of fluids but you should avoid alcoholic beverages for 24 hours.  ACTIVITY: Your care partner should take you home directly after the procedure.  You should plan to take it easy, moving slowly for the rest of the day.  You can resume normal activity the day after the procedure  however you should NOT DRIVE or use heavy machinery for 24 hours (because of the sedation medicines used during the test).    SYMPTOMS TO REPORT IMMEDIATELY: A gastroenterologist can be reached at any hour.  During normal business hours, 8:30 AM to 5:00 PM Monday through Friday, call [phone].  After hours and on weekends, please call the GI answering service at [phone] who will take a message and have the physician on call contact you.   Following lower endoscopy (colonoscopy or flexible sigmoidoscopy):  Excessive amounts of blood in the stool  Significant tenderness or worsening of abdominal pains  Swelling of the abdomen that is new, acute  Fever of 100F or higher  FOLLOW UP: If any biopsies were taken you will be contacted by phone or by letter within the next 1-3 weeks.  Call your gastroenterologist if you have not heard about the biopsies in 3 weeks.  Our staff will call the home number listed on your records the next business day following your procedure to check on you and address any questions or concerns that you may have at that time regarding the information given to you following your procedure. This is a courtesy call and so if there is no answer at the home number and we have not heard from you through the emergency physician on call, we will assume that you have returned to your regular daily activities without incident.  SIGNATURES/CONFIDENTIALITY: You and/or your care partner have signed paperwork which will be entered into your electronic medical record.  These signatures attest to the fact that that the information above on your After Visit Summary has been reviewed and is understood.  Full responsibility of the confidentiality of this discharge information lies with you and/or your care-partner.  Recommendations Next colonoscopy in 10 years. Diverticulosis handout provided.

## 2014-03-23 ENCOUNTER — Telehealth: Payer: Self-pay | Admitting: *Deleted

## 2014-03-23 NOTE — Telephone Encounter (Signed)
  Follow up Call-  Call back number 03/20/2014  Post procedure Call Back phone  # [phone]  Permission to leave phone message Yes     Patient questions:  Message left to call us if necessary.

## 2014-04-23 ENCOUNTER — Encounter: Payer: Self-pay | Admitting: Family Medicine

## 2014-05-01 ENCOUNTER — Encounter: Payer: Self-pay | Admitting: Family Medicine

## 2014-05-04 ENCOUNTER — Encounter: Payer: Self-pay | Admitting: Family Medicine

## 2014-05-06 ENCOUNTER — Encounter: Payer: Self-pay | Admitting: Family Medicine

## 2014-05-12 ENCOUNTER — Encounter: Payer: Self-pay | Admitting: Family Medicine

## 2014-05-19 ENCOUNTER — Ambulatory Visit: Payer: Managed Care, Other (non HMO) | Admitting: Family Medicine

## 2014-05-19 NOTE — Progress Notes (Signed)
Patient and mother got confused and thought this was appt for daughter. Daughter did not need appt. So appt cancedled

## 2014-05-22 ENCOUNTER — Encounter: Payer: Self-pay | Admitting: Family Medicine

## 2014-05-22 ENCOUNTER — Ambulatory Visit (INDEPENDENT_AMBULATORY_CARE_PROVIDER_SITE_OTHER): Payer: Managed Care, Other (non HMO) | Admitting: Family Medicine

## 2014-05-22 VITALS — BP 118/80 | HR 70 | Temp 97.7°F | Ht 69.0 in | Wt 218.7 lb

## 2014-05-22 DIAGNOSIS — R739 Hyperglycemia, unspecified: Secondary | ICD-10-CM

## 2014-05-22 DIAGNOSIS — E669 Obesity, unspecified: Secondary | ICD-10-CM

## 2014-05-22 DIAGNOSIS — E785 Hyperlipidemia, unspecified: Secondary | ICD-10-CM

## 2014-05-22 LAB — HEMOGLOBIN A1C: Hgb A1c MFr Bld: 6.1 % (ref 4.6–6.5)

## 2014-05-22 LAB — LIPID PANEL
Cholesterol: 211 mg/dL — ABNORMAL HIGH (ref 0–200)
HDL: 34.2 mg/dL — ABNORMAL LOW (ref >39.00)
LDL Cholesterol: 156 mg/dL — ABNORMAL HIGH (ref 0–99)
NonHDL: 176.8
Total CHOL/HDL Ratio: 6
Triglycerides: 106 mg/dL (ref 0.0–149.0)
VLDL: 21.2 mg/dL (ref 0.0–40.0)

## 2014-05-22 NOTE — Progress Notes (Signed)
  HPI:  Follow up:  Prediabetes/HLD: -diet and exercise advised and discussed adding statin -Diet: variety of foods, balanced and well rounded - has stopped red meat and pork -Exercise: walking 30 minutes twice per week; diet is healthy - has not been exercising lately -denies:polyuria, polydipsia, vision changes  Vit D insuffieciency: -advised oral Vit D -reports: not taking vit D -denies: muscle pain, fatigue, kidney stones  Recovering from sinus surgery. Reports doing well and can breath!  ROS: See pertinent positives and negatives per HPI.  Past Medical History  Diagnosis Date  . Lactose intolerance   . Allergy   . GERD (gastroesophageal reflux disease)     "does not need meds anymore"  . Hyperlipidemia     Past Surgical History  Procedure Laterality Date  . 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 Abdominal hysterectomy  2006    benign tumor, complete  . Knee arthroscopy Right     Family History  Problem Relation Age of Onset  . Arthritis Mother   . Hyperlipidemia Mother   . Stroke Mother   . Hypertension Mother   . Colon cancer Neg Hx     History   Social History  . Marital Status: Married    Spouse Name: N/A    Number of Children: N/A  . Years of Education: N/A   Social History Main Topics  . Smoking status: Never Smoker   . Smokeless tobacco: Never Used  . Alcohol Use: No  . Drug Use: No  . Sexual Activity: None   Other Topics Concern  . None   Social History Narrative   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: lives 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, husband and mother      Spiritual Beliefs: Christian      Lifestyle: walks a little a few days per week, diet is ok             Current outpatient prescriptions: cholecalciferol (VITAMIN D) 1000 UNITS tablet, Take 1,000 Units by mouth daily., Disp: , Rfl: ;  Polyethylene Glycol 3350 (MIRALAX PO), Take by mouth daily., Disp: , Rfl:   EXAM:  Filed Vitals:   05/22/14  1026  BP: 118/80  Pulse: 70  Temp: 97.7 F (36.5 C)    Body mass index is 32.28 kg/(m^2).  GENERAL: vitals reviewed and listed above, alert, oriented, appears well hydrated and in no acute distress  HEENT: atraumatic, conjunttiva clear, no obvious abnormalities on inspection of external nose and ears  NECK: no obvious masses on inspection  LUNGS: clear to auscultation bilaterally, no wheezes, rales or rhonchi, good air movement  CV: HRRR, no peripheral edema  MS: moves all extremities without noticeable abnormality  PSYCH: pleasant and cooperative, no obvious depression or anxiety  ASSESSMENT AND PLAN:  Discussed the following assessment and plan:  Hyperlipemia - from 2011, 2012 labs - Plan: Lipid Panel  Hyperglycemia - Plan: Hemoglobin A1c  Obesity  -Discussed lifestyle changes at length -FASTING Labs -follow up in 4-6 months -Patient advised to return or notify a doctor immediately if symptoms worsen or persist or new concerns arise.  There are no Patient Instructions on file for this visit.   Colin Benton R.

## 2014-05-22 NOTE — Progress Notes (Signed)
Pre visit review using our clinic review tool, if applicable. No additional management support is needed unless otherwise documented below in the visit note. 

## 2014-06-08 ENCOUNTER — Ambulatory Visit (INDEPENDENT_AMBULATORY_CARE_PROVIDER_SITE_OTHER): Payer: Managed Care, Other (non HMO) | Admitting: Family Medicine

## 2014-06-08 ENCOUNTER — Encounter: Payer: Self-pay | Admitting: Family Medicine

## 2014-06-08 VITALS — BP 118/78 | HR 78 | Temp 97.8°F | Ht 69.0 in | Wt 221.0 lb

## 2014-06-08 DIAGNOSIS — J069 Acute upper respiratory infection, unspecified: Secondary | ICD-10-CM

## 2014-06-08 NOTE — Progress Notes (Signed)
HPI:  -started: 2 days ago -symptoms:nasal congestion, sore throat, cough, L ear pressure, sneezing -denies:fever, SOB, NVD, tooth pain -has tried: musinex and nasal saline -sick contacts/travel/risks: denies flu exposure, tick exposure or or Ebola risks -Hx of: allergies ROS: See pertinent positives and negatives per HPI.  Past Medical History  Diagnosis Date  . Lactose intolerance   . Allergy   . GERD (gastroesophageal reflux disease)     "does not need meds anymore"  . Hyperlipidemia     Past Surgical History  Procedure Laterality Date  . 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 Abdominal hysterectomy  2006    benign tumor, complete  . Knee arthroscopy Right     Family History  Problem Relation Age of Onset  . Arthritis Mother   . Hyperlipidemia Mother   . Stroke Mother   . Hypertension Mother   . Colon cancer Neg Hx     History   Social History  . Marital Status: Married    Spouse Name: N/A    Number of Children: N/A  . Years of Education: N/A   Social History Main Topics  . Smoking status: Never Smoker   . Smokeless tobacco: Never Used  . Alcohol Use: No  . Drug Use: No  . Sexual Activity: None   Other Topics Concern  . 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  cholecalciferol (VITAMIN D) 1000 UNITS tablet, Take 1,000 Units by mouth daily., Disp: , Rfl:  .  Polyethylene Glycol 3350 (MIRALAX PO), Take by mouth daily., Disp: , Rfl:   EXAM:  Filed Vitals:   06/08/14 1027  BP: 118/78  Pulse: 78  Temp: 97.8 F (36.6 C)    Body mass index is 32.62 kg/(m^2).  GENERAL: vitals reviewed and listed above, alert, oriented, appears well hydrated and in no acute distress  HEENT:  atraumatic, conjunttiva clear, no obvious abnormalities on inspection of external nose and ears, normal appearance of ear canals and TMs, clear nasal congestion, mild post oropharyngeal erythema with PND, no tonsillar edema or exudate, no sinus TTP  NECK: no obvious masses on inspection  LUNGS: clear to auscultation bilaterally, no wheezes, rales or rhonchi, good air movement  CV: HRRR, no peripheral edema  MS: moves all extremities without noticeable abnormality  PSYCH: pleasant and cooperative, no obvious depression or anxiety  ASSESSMENT AND PLAN:  Discussed the following assessment and plan:  Viral upper respiratory infection  -given HPI and exam findings today, a serious infection or illness is unlikely. We discussed potential etiologies, with VURI being most likely, and advised supportive care and monitoring. We discussed treatment side effects, likely course, antibiotic misuse, transmission, and signs of developing a serious illness. -of course, we advised to return or notify a doctor immediately if symptoms worsen or persist or new concerns arise.    Patient Instructions  INSTRUCTIONS FOR UPPER RESPIRATORY INFECTION:  -plenty of rest and fluids  -nasal saline wash 2-3 times daily (use prepackaged nasal saline or bottled/distilled water if making your own)   -clean nose with nasal saline before using the nasal steroid (  flonase) or AFRIN  -can use AFRIN nasal spray for drainage and nasal congestion - but do NOT use longer then 3-4 days  -flonase for 21 days  -can use tylenol or ibuprofen as directed for aches and sorethroat  -in the winter time, using a humidifier at night is helpful (please follow cleaning instructions)  -if you are taking a cough medication - use only as directed, may also try a teaspoon of honey to coat the throat and throat lozenges  -for sore throat, salt water gargles can help  -follow up if you have fevers, facial pain, tooth pain, difficulty  breathing or are worsening in 5-7 days       Deanna [name] R.

## 2014-06-08 NOTE — Patient Instructions (Addendum)
INSTRUCTIONS FOR UPPER RESPIRATORY INFECTION:  -plenty of rest and fluids  -nasal saline wash 2-3 times daily (use prepackaged nasal saline or bottled/distilled water if making your own)   -clean nose with nasal saline before using the nasal steroid (flonase) or AFRIN  -can use AFRIN nasal spray for drainage and nasal congestion - but do NOT use longer then 3-4 days  -flonase for 21 days  -can use tylenol or ibuprofen as directed for aches and sorethroat  -in the winter time, using a humidifier at night is helpful (please follow cleaning instructions)  -if you are taking a cough medication - use only as directed, may also try a teaspoon of honey to coat the throat and throat lozenges  -for sore throat, salt water gargles can help  -follow up if you have fevers, facial pain, tooth pain, difficulty breathing or are worsening in 5-7 days

## 2014-06-08 NOTE — Progress Notes (Signed)
Pre visit review using our clinic review tool, if applicable. No additional management support is needed unless otherwise documented below in the visit note. 

## 2014-06-15 ENCOUNTER — Encounter: Payer: Self-pay | Admitting: Family Medicine

## 2014-06-15 ENCOUNTER — Ambulatory Visit (INDEPENDENT_AMBULATORY_CARE_PROVIDER_SITE_OTHER): Payer: Managed Care, Other (non HMO) | Admitting: Family Medicine

## 2014-06-15 VITALS — BP 118/88 | HR 82 | Temp 98.0°F | Ht 69.0 in | Wt 212.7 lb

## 2014-06-15 DIAGNOSIS — J069 Acute upper respiratory infection, unspecified: Secondary | ICD-10-CM

## 2014-06-15 MED ORDER — HYDROCODONE-HOMATROPINE 5-1.5 MG/5ML PO SYRP: 5.0000 mL | ORAL_SOLUTION | Freq: Every evening | ORAL | Status: DC | PRN

## 2014-06-15 NOTE — Progress Notes (Signed)
HPI:  URI: -started: about 1 week ago -symptoms:nasal congestion, sore throat, cough - mostly getting better but persistent sore throat and cough -denies:fever, SOB, NVD, tooth pain -has tried: supportive measures -sick contacts/travel/risks: denies flu exposure, tick exposure or or Ebola risks -Hx of: allergies  ROS: See pertinent positives and negatives per HPI.  Past Medical History  Diagnosis Date  . Lactose intolerance   . Allergy   . GERD (gastroesophageal reflux disease)     "does not need meds anymore"  . Hyperlipidemia     Past Surgical History  Procedure Laterality Date  . 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 Abdominal hysterectomy  2006    benign tumor, complete  . Knee arthroscopy Right     Family History  Problem Relation Age of Onset  . Arthritis Mother   . Hyperlipidemia Mother   . Stroke Mother   . Hypertension Mother   . Colon cancer Neg Hx     History   Social History  . Marital Status: Married    Spouse Name: N/A    Number of Children: N/A  . Years of Education: N/A   Social History Main Topics  . Smoking status: Never Smoker   . Smokeless tobacco: Never Used  . Alcohol Use: No  . Drug Use: No  . Sexual Activity: None   Other Topics Concern  . 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  cholecalciferol (VITAMIN D) 1000 UNITS tablet, Take 1,000 Units by mouth daily., Disp: , Rfl:  .  Polyethylene Glycol 3350 (MIRALAX PO), Take by mouth daily., Disp: , Rfl:  .  HYDROcodone-homatropine (HYCODAN) 5-1.5 MG/5ML syrup, Take 5 mLs by mouth at bedtime as needed for cough., Disp: 120 mL, Rfl: 0  EXAM:  Filed Vitals:   06/15/14 1559  BP: 118/88  Pulse: 82  Temp: 98 F  (36.7 C)    Body mass index is 31.4 kg/(m^2).  GENERAL: vitals reviewed and listed above, alert, oriented, appears well hydrated and in no acute distress  HEENT: atraumatic, conjunttiva clear, no obvious abnormalities on inspection of external nose and ears, normal appearance of ear canals and TMs, clear nasal congestion, mild post oropharyngeal erythema with PND, no tonsillar edema or exudate, no sinus TTP  NECK: no obvious masses on inspection  LUNGS: clear to auscultation bilaterally, no wheezes, rales or rhonchi, good air movement  CV: HRRR, no peripheral edema  MS: moves all extremities without noticeable abnormality  PSYCH: pleasant and cooperative, no obvious depression or anxiety  ASSESSMENT AND PLAN:  Discussed the following assessment and plan:  Acute upper respiratory infection - Plan: HYDROcodone-homatropine (HYCODAN) 5-1.5 MG/5ML syrup  -given HPI and exam findings today, a serious infection or illness is unlikely. We discussed potential etiologies, with VURI being most likely, and advised supportive care and monitoring. We discussed treatment side effects, likely course, antibiotic misuse, transmission, and signs of developing a serious illness. -aleve and cough medication, seeing ENT next week and advised to notify them is symptoms persist -of course, we advised to return or notify a doctor immediately if symptoms worsen  or persist or new concerns arise.    There are no Patient Instructions on file for this visit.   Colin Benton R.

## 2014-06-15 NOTE — Progress Notes (Signed)
Pre visit review using our clinic review tool, if applicable. No additional management support is needed unless otherwise documented below in the visit note. 

## 2014-10-21 ENCOUNTER — Other Ambulatory Visit: Payer: Self-pay | Admitting: Family Medicine

## 2014-10-21 ENCOUNTER — Ambulatory Visit (INDEPENDENT_AMBULATORY_CARE_PROVIDER_SITE_OTHER): Payer: Managed Care, Other (non HMO) | Admitting: Family Medicine

## 2014-10-21 ENCOUNTER — Encounter: Payer: Self-pay | Admitting: Family Medicine

## 2014-10-21 VITALS — BP 110/80 | HR 63 | Temp 98.2°F | Ht 69.0 in | Wt 218.1 lb

## 2014-10-21 DIAGNOSIS — R011 Cardiac murmur, unspecified: Secondary | ICD-10-CM

## 2014-10-21 DIAGNOSIS — R7309 Other abnormal glucose: Secondary | ICD-10-CM

## 2014-10-21 DIAGNOSIS — R7303 Prediabetes: Secondary | ICD-10-CM

## 2014-10-21 DIAGNOSIS — E669 Obesity, unspecified: Secondary | ICD-10-CM | POA: Diagnosis not present

## 2014-10-21 DIAGNOSIS — E785 Hyperlipidemia, unspecified: Secondary | ICD-10-CM | POA: Diagnosis not present

## 2014-10-21 DIAGNOSIS — Z6832 Body mass index (BMI) 32.0-32.9, adult: Secondary | ICD-10-CM | POA: Diagnosis not present

## 2014-10-21 DIAGNOSIS — Z23 Encounter for immunization: Secondary | ICD-10-CM | POA: Diagnosis not present

## 2014-10-21 DIAGNOSIS — Z1231 Encounter for screening mammogram for malignant neoplasm of breast: Secondary | ICD-10-CM

## 2014-10-21 LAB — LIPID PANEL
Cholesterol: 199 mg/dL (ref 0–200)
HDL: 40.2 mg/dL (ref >39.00)
LDL Cholesterol: 143 mg/dL — ABNORMAL HIGH (ref 0–99)
NonHDL: 158.8
Total CHOL/HDL Ratio: 5
Triglycerides: 80 mg/dL (ref 0.0–149.0)
VLDL: 16 mg/dL (ref 0.0–40.0)

## 2014-10-21 LAB — BASIC METABOLIC PANEL
BUN: 12 mg/dL (ref 6–23)
CO2: 29 mEq/L (ref 19–32)
Calcium: 9.4 mg/dL (ref 8.4–10.5)
Chloride: 104 mEq/L (ref 96–112)
Creatinine, Ser: 0.77 mg/dL (ref 0.40–1.20)
GFR: 101.34 mL/min (ref >60.00)
Glucose, Bld: 83 mg/dL (ref 70–99)
Potassium: 4.2 mEq/L (ref 3.5–5.1)
Sodium: 137 mEq/L (ref 135–145)

## 2014-10-21 LAB — HEMOGLOBIN A1C: Hgb A1c MFr Bld: 5.8 % (ref 4.6–6.5)

## 2014-10-21 NOTE — Progress Notes (Signed)
Pre visit review using our clinic review tool, if applicable. No additional management support is needed unless otherwise documented below in the visit note. 

## 2014-10-21 NOTE — Patient Instructions (Signed)
BEFORE YOU LEAVE: -labs -follow up in 3-4 months -pneumococcal  -please check with your insurance about the [facility] and let us know if you want to do this  -please schedule your mammogram  -We placed a referral for you as discussed to check your heart. It usually takes about 1-2 weeks to process and schedule this referral. If you have not heard from [place] regarding this appointment in 2 weeks please contact our office.  -consider starting pravastatin if cholesterol still up

## 2014-10-21 NOTE — Progress Notes (Addendum)
HPI:  Follow up:  Prediabetes/HLD: -diet and exercise advised and discussed adding statin - at this visit she agrees to start statin if cholesterol higher or remains high -Diet: variety of foods, balanced and well rounded - has stopped red meat and pork, trying to avoid whole starches and sweet -Exercise: was exercising but has not been doing as well lately - but started back last week -denies:polyuria, polydipsia, vision changes  Vit D insuffieciency: -advised oral Vit D -reports: not taking vit D -denies: muscle pain, fatigue, kidney stones  Hx splenectomy: Prevnar 13, reports done Pneumococcal 23 - repeat 5 years after splenectomy  - reports needs repeat pneumococcal menveo 2 doses 2 months apart if not done after splenectomy - reports this was done And a recent new recommendation is for another meningitis vaccine - meningococcal serogroup B (tremenba or Bexsero)   New heart murmur: -she reports never being told she has this -denies CP, SOB, DOE, swelling, palpitations -reports presyncope once per week, severe 4 times per year for 4-5 years without know cause  ROS: See pertinent positives and negatives per HPI.  Past Medical History  Diagnosis Date  . Lactose intolerance   . Allergy   . GERD (gastroesophageal reflux disease)     "does not need meds anymore"  . Hyperlipidemia     Past Surgical History  Procedure Laterality Date  . 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 Abdominal hysterectomy  2006    benign tumor, complete  . Knee arthroscopy Right     Family History  Problem Relation Age of Onset  . Arthritis Mother   . Hyperlipidemia Mother   . Stroke Mother   . Hypertension Mother   . Colon cancer Neg Hx     History   Social History  . Marital Status: Married    Spouse Name: N/A  . Number of Children: N/A  . Years of Education: N/A   Social History Main Topics  . Smoking status: Never Smoker   . Smokeless tobacco: Never Used  . Alcohol  Use: No  . Drug Use: No  . Sexual Activity: Not on file   Other Topics Concern  . 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  cholecalciferol (VITAMIN D) 1000 UNITS tablet, Take 1,000 Units by mouth daily., Disp: , Rfl:  .  Polyethylene Glycol 3350 (MIRALAX PO), Take by mouth daily., Disp: , Rfl:   EXAM:  Filed Vitals:   10/21/14 1025  BP: 110/80  Pulse: 63  Temp: 98.2 F (36.8 C)    Body mass index is 32.19 kg/(m^2).  GENERAL: vitals reviewed and listed above, alert, oriented, appears well hydrated and in no acute distress  HEENT: atraumatic, conjunttiva clear, no obvious abnormalities on inspection of external nose and ears  NECK: no obvious masses on inspection  LUNGS: clear to auscultation bilaterally, no wheezes, rales or rhonchi, good air movement  CV: HRRR, II/VI decrescendo high pitched diastolic heart murmur hear best at LSB, no peripheral edema  MS: moves all extremities without noticeable abnormality  PSYCH: pleasant and cooperative, no obvious depression or anxiety  ASSESSMENT AND PLAN:  Discussed the following assessment and plan:  Prediabetes - Plan: Hemoglobin A1c  Hyperlipemia - Plan: Lipid Panel  Obesity - Plan: Basic metabolic panel  BMI 85.0-27.7,AJOIN  Heart murmur - Plan: Echocardiogram  Need for prophylactic vaccination against Streptococcus pneumoniae (pneumococcus) - Plan: Pneumococcal polysaccharide vaccine 23-valent greater than or equal to 2yo subcutaneous/IM  -Patient advised to return or notify a doctor immediately if symptoms worsen or persist or new concerns arise.  Patient Instructions  BEFORE YOU LEAVE: -labs -follow up in 3-4 months -pneumococcal  -please check  with your insurance about the [facility] and let us know if you want to do this  -please schedule your mammogram  -We placed a referral for you as discussed to check your heart. It usually takes about 1-2 weeks to process and schedule this referral. If you have not heard from [place] regarding this appointment in 2 weeks please contact our office.  -consider starting pravastatin if cholesterol still up       KIM, HANNAH R.

## 2014-10-22 MED ORDER — PRAVASTATIN SODIUM 20 MG PO TABS: 20.0000 mg | ORAL_TABLET | Freq: Every day | ORAL | Status: DC

## 2014-10-22 NOTE — Addendum Note (Signed)
Addended by: Agnes Lawrence on: 10/22/2014 01:21 PM   Modules accepted: Orders

## 2014-10-23 ENCOUNTER — Ambulatory Visit (HOSPITAL_COMMUNITY)
Admission: RE | Admit: 2014-10-23 | Discharge: 2014-10-23 | Disposition: A | Discharge status: Home / Self Care | Payer: Managed Care, Other (non HMO) | Source: Ambulatory Visit | Attending: Family Medicine | Admitting: Family Medicine

## 2014-10-23 DIAGNOSIS — Z1231 Encounter for screening mammogram for malignant neoplasm of breast: Secondary | ICD-10-CM | POA: Diagnosis present

## 2014-10-29 ENCOUNTER — Other Ambulatory Visit: Payer: Self-pay

## 2014-10-29 ENCOUNTER — Ambulatory Visit (HOSPITAL_COMMUNITY): Payer: Managed Care, Other (non HMO) | Attending: Internal Medicine

## 2014-10-29 DIAGNOSIS — R011 Cardiac murmur, unspecified: Secondary | ICD-10-CM | POA: Diagnosis not present

## 2014-10-29 DIAGNOSIS — I358 Other nonrheumatic aortic valve disorders: Secondary | ICD-10-CM | POA: Insufficient documentation

## 2014-10-29 DIAGNOSIS — I351 Nonrheumatic aortic (valve) insufficiency: Secondary | ICD-10-CM | POA: Insufficient documentation

## 2015-02-02 ENCOUNTER — Encounter: Payer: Self-pay | Admitting: Family Medicine

## 2015-02-02 ENCOUNTER — Encounter: Payer: Self-pay | Admitting: *Deleted

## 2015-02-02 ENCOUNTER — Ambulatory Visit (INDEPENDENT_AMBULATORY_CARE_PROVIDER_SITE_OTHER): Payer: Managed Care, Other (non HMO) | Admitting: Family Medicine

## 2015-02-02 VITALS — BP 122/96 | HR 62 | Temp 97.6°F | Ht 69.0 in | Wt 215.6 lb

## 2015-02-02 DIAGNOSIS — B349 Viral infection, unspecified: Secondary | ICD-10-CM

## 2015-02-02 LAB — POCT RAPID STREP A (OFFICE): Rapid Strep A Screen: NEGATIVE

## 2015-02-02 MED ORDER — ONDANSETRON HCL 4 MG PO TABS: 4.0000 mg | ORAL_TABLET | Freq: Three times a day (TID) | ORAL | Status: DC | PRN

## 2015-02-02 NOTE — Patient Instructions (Signed)
BEFORE YOU LEAVE: -work note -strep test and culture  Use the zofran according to instructions as needed for the nausea and vomiting  Plenty of fluids with electrolytes - soup broth is good  Afrin nasal spray twice daily for 3 days  INSTRUCTIONS FOR UPPER RESPIRATORY INFECTION:  -plenty of rest and fluids  -nasal saline wash 2-3 times daily (use prepackaged nasal saline or bottled/distilled water if making your own)   -can use AFRIN nasal spray for drainage and nasal congestion - but do NOT use longer then 3-4 days  -can use tylenol (in no history of liver disease) or ibuprofen (if no history of kidney disease, bowel bleeding or significant heart disease) as directed for aches and sorethroat  -in the winter time, using a humidifier at night is helpful (please follow cleaning instructions)  -if you are taking a cough medication - use only as directed, may also try a teaspoon of honey to coat the throat and throat lozenges. If given a cough medication with codeine or hydrocodone or other narcotic please be advised that this contains a strong and  potentially addicting medication. Please follow instructions carefully, take as little as possible and only use AS NEEDED for severe cough. Discuss potential side effects with your pharmacy. Please do not drive or operate machinery while taking these types of medications. Please do not take other sedating medications, drugs or alcohol while taking this medication without discussing with your doctor.  -for sore throat, salt water gargles can help  -follow up if you have fevers, facial pain, tooth pain, difficulty breathing or are worsening or symptoms persist longer then expected  Upper Respiratory Infection, Adult An upper respiratory infection (URI) is also known as the common cold. It is often caused by a type of germ (virus). Colds are easily spread (contagious). You can pass it to others by kissing, coughing, sneezing, or drinking out of the  same glass. Usually, you get better in 1 to 3  weeks.  However, the cough can last for even longer. HOME CARE   Only take medicine as told by your doctor. Follow instructions provided above.  Drink enough water and fluids to keep your pee (urine) clear or pale yellow.  Get plenty of rest.  Return to work when your temperature is < 100 for 24 hours or as told by your doctor. You may use a face mask and wash your hands to stop your cold from spreading. GET HELP RIGHT AWAY IF:   After the first few days, you feel you are getting worse.  Persistent nausea and vomiting or abdominal pain  You have questions about your medicine.  You have chills, shortness of breath, or red spit (mucus).  You have pain in the face for more then 1-2 days, especially when you bend forward.  You have a fever, puffy (swollen) neck, pain when you swallow, or white spots in the back of your throat.  You have a bad headache, ear pain, sinus pain, or chest pain.  You have a high-pitched whistling sound when you breathe in and out (wheezing).  You cough up blood.  You have sore muscles or a stiff neck. MAKE SURE YOU:   Understand these instructions.  Will watch your condition.  Will get help right away if you are not doing well or get worse. Document Released: 10/18/2007 Document Revised: 07/24/2011 Document Reviewed: 08/06/2013 [facility] Patient Information 2015 [place], [place]. This information is not intended to replace advice given to you by your health care  provider. Make sure you discuss any questions you have with your health care provider.

## 2015-02-02 NOTE — Addendum Note (Signed)
Addended by: Agnes Lawrence on: 02/02/2015 10:39 AM   Modules accepted: Orders

## 2015-02-02 NOTE — Progress Notes (Signed)
Pre visit review using our clinic review tool, if applicable. No additional management support is needed unless otherwise documented below in the visit note. 

## 2015-02-02 NOTE — Progress Notes (Signed)
HPI:  Acute visit for:  URI with nv: -started about 3-4 days ago -symptoms: nasal congestion, copious amounts of PND, ear pain and pressure, cough, nausea and some vomiting today -denies: SOB, tooth pain, sinus pain, HA, fevers > 100, tick bites, travel, recent abx, flu exposure or known strep exposure -daughter sick last week and many students sick with similar symptoms  ROS: See pertinent positives and negatives per HPI.  Past Medical History  Diagnosis Date  . Lactose intolerance   . Allergy   . GERD (gastroesophageal reflux disease)     "does not need meds anymore"  . Hyperlipidemia     Past Surgical History  Procedure Laterality Date  . 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 Abdominal hysterectomy  2006    benign tumor, complete  . Knee arthroscopy Right     Family History  Problem Relation Age of Onset  . Arthritis Mother   . Hyperlipidemia Mother   . Stroke Mother   . Hypertension Mother   . Colon cancer Neg Hx     Social History   Social History  . Marital Status: Married    Spouse Name: N/A  . Number of Children: N/A  . Years of Education: N/A   Social History Main Topics  . Smoking status: Never Smoker   . Smokeless tobacco: Never Used  . Alcohol Use: No  . Drug Use: No  . Sexual Activity: Not Asked   Other Topics Concern  . 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  pravastatin (PRAVACHOL) 20 MG tablet, Take 1 tablet (20 mg total) by mouth daily., Disp: 30 tablet, Rfl: 3 .  ondansetron (ZOFRAN) 4 MG tablet, Take 1 tablet (4 mg total) by mouth every 8 (eight) hours as needed for nausea or vomiting., Disp: 20 tablet, Rfl: 0  EXAM:  Filed Vitals:   02/02/15 1012  BP: 122/96    Pulse: 62  Temp: 97.6 F (36.4 C)    Body mass index is 31.82 kg/(m^2).  GENERAL: vitals reviewed and listed above, alert, oriented, appears well hydrated and in no acute distress  HEENT: atraumatic, conjunttiva clear, no obvious abnormalities on inspection of external nose and ears, normal appearance of ear canals and TMs, thick clear nasal congestion, mild post oropharyngeal erythema with PND, 2+ tonsillar edema or exudate, no sinus TTP  NECK: no obvious masses on inspection  LUNGS: clear to auscultation bilaterally, no wheezes, rales or rhonchi, good air movement  CV: HRRR, no peripheral edema  ABD: BS+, soft, NTTP  MS: moves all extremities without noticeable abnormality  PSYCH: pleasant and cooperative, no obvious depression or anxiety  ASSESSMENT AND PLAN:  Discussed the following assessment and plan:  Viral illness - Plan: ondansetron (ZOFRAN) 4 MG tablet  -we discussed possible serious and likely etiologies, workup and treatment, treatment risks and return precautions - viral illness most likely -after this discussion, Maryhelen opted for strep test, supportive measures, oral rehydration -of course, we advised Beonka  to return or notify a doctor immediately if symptoms worsen or persist or new concerns arise.  .  -Patient  advised to return or notify a doctor immediately if symptoms worsen or persist or new concerns arise.  Patient Instructions  BEFORE YOU LEAVE: -work note -strep test and culture  Use the zofran according to instructions as needed for the nausea and vomiting  Plenty of fluids with electrolytes - soup broth is good  Afrin nasal spray twice daily for 3 days  INSTRUCTIONS FOR UPPER RESPIRATORY INFECTION:  -plenty of rest and fluids  -nasal saline wash 2-3 times daily (use prepackaged nasal saline or bottled/distilled water if making your own)   -can use AFRIN nasal spray for drainage and nasal congestion - but do NOT use longer then 3-4  days  -can use tylenol (in no history of liver disease) or ibuprofen (if no history of kidney disease, bowel bleeding or significant heart disease) as directed for aches and sorethroat  -in the winter time, using a humidifier at night is helpful (please follow cleaning instructions)  -if you are taking a cough medication - use only as directed, may also try a teaspoon of honey to coat the throat and throat lozenges. If given a cough medication with codeine or hydrocodone or other narcotic please be advised that this contains a strong and  potentially addicting medication. Please follow instructions carefully, take as little as possible and only use AS NEEDED for severe cough. Discuss potential side effects with your pharmacy. Please do not drive or operate machinery while taking these types of medications. Please do not take other sedating medications, drugs or alcohol while taking this medication without discussing with your doctor.  -for sore throat, salt water gargles can help  -follow up if you have fevers, facial pain, tooth pain, difficulty breathing or are worsening or symptoms persist longer then expected  Upper Respiratory Infection, Adult An upper respiratory infection (URI) is also known as the common cold. It is often caused by a type of germ (virus). Colds are easily spread (contagious). You can pass it to others by kissing, coughing, sneezing, or drinking out of the same glass. Usually, you get better in 1 to 3  weeks.  However, the cough can last for even longer. HOME CARE   Only take medicine as told by your doctor. Follow instructions provided above.  Drink enough water and fluids to keep your pee (urine) clear or pale yellow.  Get plenty of rest.  Return to work when your temperature is < 100 for 24 hours or as told by your doctor. You may use a face mask and wash your hands to stop your cold from spreading. GET HELP RIGHT AWAY IF:   After the first few days, you feel you  are getting worse.  Persistent nausea and vomiting or abdominal pain  You have questions about your medicine.  You have chills, shortness of breath, or red spit (mucus).  You have pain in the face for more then 1-2 days, especially when you bend forward.  You have a fever, puffy (swollen) neck, pain when you swallow, or white spots in the back of your throat.  You have a bad headache, ear pain, sinus pain, or chest pain.  You have a high-pitched whistling sound when you breathe in and out (wheezing).  You cough up blood.  You have sore muscles or a stiff neck. MAKE SURE YOU:   Understand these instructions.  Will watch your condition.  Will get help right away if you are not doing well or get worse. Document Released: 10/18/2007 Document Revised: 07/24/2011 Document Reviewed:  08/06/2013 [employer] Patient Information 2015 [place], [place]. This information is not intended to replace advice given to you by your health care provider. Make sure you discuss any questions you have with your health care provider.      Colin Benton R.

## 2015-02-04 LAB — CULTURE, GROUP A STREP: Organism ID, Bacteria: NORMAL

## 2015-02-05 ENCOUNTER — Telehealth: Payer: Self-pay | Admitting: Family Medicine

## 2015-02-05 ENCOUNTER — Ambulatory Visit (INDEPENDENT_AMBULATORY_CARE_PROVIDER_SITE_OTHER): Payer: Managed Care, Other (non HMO) | Admitting: Family Medicine

## 2015-02-05 ENCOUNTER — Encounter: Payer: Self-pay | Admitting: Family Medicine

## 2015-02-05 VITALS — BP 128/82 | HR 73 | Temp 98.3°F | Ht 69.0 in | Wt 214.8 lb

## 2015-02-05 DIAGNOSIS — J069 Acute upper respiratory infection, unspecified: Secondary | ICD-10-CM

## 2015-02-05 MED ORDER — DOXYCYCLINE HYCLATE 100 MG PO TABS: 100.0000 mg | ORAL_TABLET | Freq: Two times a day (BID) | ORAL | Status: DC

## 2015-02-05 NOTE — Progress Notes (Signed)
HPI:  Sinus congestion -started: last week; seen a few days ago and felt likely viral -symptoms:thick nasal congestion, PND now with sinus pressure and heaviness in max sinuses when leans forward -reports overall actually feels "much better" today then 3 days ago -denies:fever, SOB, NVD, tooth pain - nausea and vomiting resolved, weakness or dizziness -sick contacts/travel/risks: denies flu exposure, tick exposure or or Ebola risks -Hx of: allergies -daughter sick prior to her getting this and several co-workers sick  ROS: See pertinent positives and negatives per HPI.  Past Medical History  Diagnosis Date  . Lactose intolerance   . Allergy   . GERD (gastroesophageal reflux disease)     "does not need meds anymore"  . Hyperlipidemia     Past Surgical History  Procedure Laterality Date  . 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 Abdominal hysterectomy  2006    benign tumor, complete  . Knee arthroscopy Right     Family History  Problem Relation Age of Onset  . Arthritis Mother   . Hyperlipidemia Mother   . Stroke Mother   . Hypertension Mother   . Colon cancer Neg Hx     Social History   Social History  . Marital Status: Married    Spouse Name: N/A  . Number of Children: N/A  . Years of Education: N/A   Social History Main Topics  . Smoking status: Never Smoker   . Smokeless tobacco: Never Used  . Alcohol Use: No  . Drug Use: No  . Sexual Activity: Not on file   Other Topics Concern  . 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: lives 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, husband and mother      Spiritual Beliefs: Christian      Lifestyle: walks a little a few days per week, diet is ok              Current outpatient prescriptions:  .  ondansetron (ZOFRAN) 4 MG tablet, Take 1 tablet (4 mg total) by mouth every 8 (eight) hours as needed for nausea or vomiting., Disp: 20 tablet, Rfl: 0 .   pravastatin (PRAVACHOL) 20 MG tablet, Take 1 tablet (20 mg total) by mouth daily., Disp: 30 tablet, Rfl: 3  EXAM:  There were no vitals filed for this visit.  There is no weight on file to calculate BMI.  GENERAL: vitals reviewed and listed above, alert, oriented, appears well hydrated and in no acute distress  HEENT: atraumatic, conjunttiva clear, no obvious abnormalities on inspection of external nose and ears, normal appearance of ear canals and TMs, clear nasal congestion, mild post oropharyngeal erythema with PND, no tonsillar edema or exudate, no sinus TTP  NECK: no obvious masses on inspection  LUNGS: clear to auscultation bilaterally, no wheezes, rales or rhonchi, good air movement  CV: HRRR, no peripheral edema  MS: moves all extremities without noticeable abnormality  PSYCH: pleasant and cooperative, no obvious depression or anxiety  ASSESSMENT AND PLAN:  Discussed the following assessment and plan:  No diagnosis found.  -given HPI and exam findings today, a serious infection or illness is unlikely. We discussed potential etiologies, with VURI being most likely, and advised supportive care and monitoring. We discussed treatment side effects, likely course, antibiotic misuse, transmission, and signs of developing a serious illness. -nasal decongestant for 3 days -advised to start eating and ensuring plenty of  fluids and avoid dairy -abx in case worsens over weekend with fevers or sinus pain given length of symptoms but this seems more viral given improving -of course, we advised to return or notify a doctor immediately if symptoms worsen or persist or new concerns arise.    There are no Patient Instructions on file for this visit.   Deanna [name] R.

## 2015-02-05 NOTE — Telephone Encounter (Signed)
Pt was seen on 9-20 for viral illness. Pt is having troubling holding her head up and down. Pt is getting dizzy. Please advise.

## 2015-02-05 NOTE — Telephone Encounter (Signed)
Pt has been sch today at 230 pm

## 2015-02-05 NOTE — Patient Instructions (Signed)
Please start eating regular meals but avoid any dairy for 5- 7 days  AFRIN nasal spray twice daily for 3 days  Drink plenty of fluids  If worsening, sinus pain or fevers take the antibiotic as instructed to complete the course; seek care immediately if feeling very sick or not improving with antibiotic. If you end up not requiring the antibiotic please shred the prescription.

## 2015-02-05 NOTE — Telephone Encounter (Signed)
I called the pt and she complains of dizziness for the past 3 days ,a "weird" sensation, weakness and feels like she cannot hold her head up or down, feels as if its her sinuses and denies a cough, sore throat or fever.  She also denies any vomiting, nausea or diarrhea and has been eating soup and drinking water and urinating regularly.

## 2015-02-05 NOTE — Progress Notes (Signed)
Pre visit review using our clinic review tool, if applicable. No additional management support is needed unless otherwise documented below in the visit note. 

## 2015-02-10 ENCOUNTER — Telehealth: Payer: Self-pay | Admitting: Family Medicine

## 2015-02-10 NOTE — Telephone Encounter (Signed)
Patient Name: Deanna [name] DOB: August 29, 1960 Initial Comment Caller states last night she has a blood vessel bust in her eye Nurse Assessment Nurse: Vallery Sa, RN, Cathy Date/Time (Eastern Time): 02/10/2015 11:56:11 AM Confirm and document reason for call. If symptomatic, describe symptoms. ---Caller states she developed a burst blood vessel in her right eye last night. No injury in the past 3 days. No fever. Has the patient traveled out of the country within the last 30 days? ---No Does the patient require triage? ---Yes Related visit to physician within the last 2 weeks? ---No Does the PT have any chronic conditions? (i.e. diabetes, asthma, etc.) ---Yes List chronic conditions. ---Vomiting illness and dizziness last week, High Cholesterol Did the patient indicate they were pregnant? ---No Guidelines Guideline Title Affirmed Question Affirmed Notes Eye Pain [1] Mild eye pain AND [2] present < 24 hours (all triage questions negative) Final Disposition User See Physician within [place], RN, Tye [place] Comments Upgraded to See Within 24 Hours due to caller's concern. Scheduled for 10:15am appointment with Dr. Maudie Mercury 02/11/15. Referrals REFERRED TO PCP OFFICE Disagree/Comply: Comply

## 2015-02-11 ENCOUNTER — Ambulatory Visit (INDEPENDENT_AMBULATORY_CARE_PROVIDER_SITE_OTHER): Payer: Managed Care, Other (non HMO) | Admitting: Family Medicine

## 2015-02-11 ENCOUNTER — Ambulatory Visit: Payer: Managed Care, Other (non HMO) | Admitting: Family Medicine

## 2015-02-11 ENCOUNTER — Encounter: Payer: Self-pay | Admitting: Family Medicine

## 2015-02-11 VITALS — BP 118/80 | HR 72 | Temp 98.1°F | Ht 69.0 in | Wt 217.3 lb

## 2015-02-11 DIAGNOSIS — H1131 Conjunctival hemorrhage, right eye: Secondary | ICD-10-CM

## 2015-02-11 NOTE — Patient Instructions (Signed)
Subconjunctival Hemorrhage Your exam shows you have a subconjunctival hemorrhage. This is a harmless collection of blood covering a portion of the white of the eye. This condition may be due to injury or to straining (lifting, sneezing, or coughing). Often, there is no known cause. Subconjunctival blood does not cause pain or vision problems. This condition needs no treatment. It will take 1 to 2 weeks for the blood to dissolve. If you take aspirin or Coumadin on a daily basis or if you have high blood pressure, you should check with your doctor about the need for further treatment. Please call your doctor if you have problems with your vision, pain around the eye, or any other concerns about your condition. Document Released: 06/08/2004 Document Revised: 07/24/2011 Document Reviewed: 03/29/2009 [employer] Patient Information 2015 [employer]. This information is not intended to replace advice given to you by your health care provider. Make sure you discuss any questions you have with your health care provider.  

## 2015-02-11 NOTE — Progress Notes (Signed)
Pre visit review using our clinic review tool, if applicable. No additional management support is needed unless otherwise documented below in the visit note. 

## 2015-02-11 NOTE — Progress Notes (Signed)
HPI:  Acute visit for:  Red eye: -broke blood vessel in eye with large sneeze yesterday -R eye -no pain, no loss of vision, not recurrent, no abnormal bleeding elsewhere  ROS: See pertinent positives and negatives per HPI.  Past Medical History  Diagnosis Date  . Lactose intolerance   . Allergy   . GERD (gastroesophageal reflux disease)     "does not need meds anymore"  . Hyperlipidemia     Past Surgical History  Procedure Laterality Date  . 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 Abdominal hysterectomy  2006    benign tumor, complete  . Knee arthroscopy Right     Family History  Problem Relation Age of Onset  . Arthritis Mother   . Hyperlipidemia Mother   . Stroke Mother   . Hypertension Mother   . Colon cancer Neg Hx     Social History   Social History  . Marital Status: Married    Spouse Name: N/A  . Number of Children: N/A  . Years of Education: N/A   Social History Main Topics  . Smoking status: Never Smoker   . Smokeless tobacco: Never Used  . Alcohol Use: No  . Drug Use: No  . Sexual Activity: Not Asked   Other Topics Concern  . 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  pravastatin (PRAVACHOL) 20 MG tablet, Take 1 tablet (20 mg total) by mouth daily., Disp: 30 tablet, Rfl: 3  EXAM:  Filed Vitals:   02/11/15 1025  BP: 118/80  Pulse: 72  Temp: 98.1 F (36.7 C)    Body mass index is 32.08 kg/(m^2).  GENERAL: vitals reviewed and listed above, alert, oriented, appears well hydrated and in no acute distress  HEENT: atraumatic, conjunttiva clear except for small R subconj hemorrhage, visual acuity grossly intact,, no obvious abnormalities on inspection of external nose and  ears  NECK: no obvious masses on inspection  MS: moves all extremities without noticeable abnormality  PSYCH: pleasant and cooperative, no obvious depression or anxiety  ASSESSMENT AND PLAN:  Discussed the following assessment and plan:  Subconjunctival hemorrhage of right eye  -Patient advised to return or notify a doctor immediately if symptoms worsen or persist or new concerns arise.  Patient Instructions  Subconjunctival Hemorrhage Your exam shows you have a subconjunctival hemorrhage. This is a harmless collection of blood covering a portion of the white of the eye. This condition may be due to injury or to straining (lifting, sneezing, or coughing). Often, there is no known cause. Subconjunctival blood does not cause pain or vision problems. This condition needs no treatment. It will take 1 to 2 weeks for the blood to dissolve. If you take aspirin or Coumadin on a daily basis or if you have high blood pressure, you should check with your doctor about the need for further treatment. Please call your doctor if you have problems with your vision, pain around the eye, or any other concerns about your condition. Document Released: 06/08/2004 Document Revised: 07/24/2011 Document Reviewed: 03/29/2009 [facility] Patient Information 2015 [place], [place]. This information is not intended  to replace advice given to you by your health care provider. Make sure you discuss any questions you have with your health care provider.      Colin Benton R.

## 2015-03-16 ENCOUNTER — Other Ambulatory Visit: Payer: Self-pay | Admitting: Family Medicine

## 2015-04-13 ENCOUNTER — Ambulatory Visit (INDEPENDENT_AMBULATORY_CARE_PROVIDER_SITE_OTHER): Payer: Managed Care, Other (non HMO) | Admitting: Family Medicine

## 2015-04-13 ENCOUNTER — Encounter: Payer: Self-pay | Admitting: Family Medicine

## 2015-04-13 VITALS — BP 118/80 | HR 74 | Temp 98.1°F | Ht 69.0 in | Wt 216.5 lb

## 2015-04-13 DIAGNOSIS — E785 Hyperlipidemia, unspecified: Secondary | ICD-10-CM | POA: Diagnosis not present

## 2015-04-13 DIAGNOSIS — M545 Low back pain, unspecified: Secondary | ICD-10-CM

## 2015-04-13 LAB — LIPID PANEL
Cholesterol: 171 mg/dL (ref 0–200)
HDL: 41.4 mg/dL (ref >39.00)
LDL Cholesterol: 115 mg/dL — ABNORMAL HIGH (ref 0–99)
NonHDL: 129.85
Total CHOL/HDL Ratio: 4
Triglycerides: 74 mg/dL (ref 0.0–149.0)
VLDL: 14.8 mg/dL (ref 0.0–40.0)

## 2015-04-13 MED ORDER — CYCLOBENZAPRINE HCL 5 MG PO TABS: 5.0000 mg | ORAL_TABLET | Freq: Every day | ORAL | Status: DC

## 2015-04-13 NOTE — Progress Notes (Signed)
Pre visit review using our clinic review tool, if applicable. No additional management support is needed unless otherwise documented below in the visit note. 

## 2015-04-13 NOTE — Progress Notes (Signed)
HPI:   Acute visit for:  Low back pain: -started about 3 weeks ago after doing "butt twists" -reports pain in bilat lower back muscles R> L, improving some but still hurts with bending, standing and certain movements -denies: radiation, weakness, numbness, malaise, fevers, dysuria, changes in bowel or bladder function -aleve helps  HLD: -want to check labs  ROS: See pertinent positives and negatives per HPI.  Past Medical History  Diagnosis Date  . Lactose intolerance   . Allergy   . GERD (gastroesophageal reflux disease)     "does not need meds anymore"  . Hyperlipidemia     Past Surgical History  Procedure Laterality Date  . 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 Abdominal hysterectomy  2006    benign tumor, complete  . Knee arthroscopy Right     Family History  Problem Relation Age of Onset  . Arthritis Mother   . Hyperlipidemia Mother   . Stroke Mother   . Hypertension Mother   . Colon cancer Neg Hx     Social History   Social History  . Marital Status: Married    Spouse Name: N/A  . Number of Children: N/A  . Years of Education: N/A   Social History Main Topics  . Smoking status: Never Smoker   . Smokeless tobacco: Never Used  . Alcohol Use: No  . Drug Use: No  . Sexual Activity: Not Asked   Other Topics Concern  . 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  pravastatin (PRAVACHOL) 20 MG tablet, TAKE 1 TABLET (20 MG TOTAL) BY MOUTH DAILY., Disp: 30 tablet, Rfl: 3 .  cyclobenzaprine (FLEXERIL) 5 MG tablet, Take 1 tablet (5 mg total) by mouth at bedtime., Disp: 20 tablet, Rfl: 0  EXAM:  Filed Vitals:   04/13/15 1352  BP: 118/80  Pulse: 74  Temp: 98.1 F (36.7 C)    Body  mass index is 31.96 kg/(m^2).  GENERAL: vitals reviewed and listed above, alert, oriented, appears well hydrated and in no acute distress  HEENT: atraumatic, conjunttiva clear, no obvious abnormalities on inspection of external nose and ears  NECK: no obvious masses on inspection edema  MS: moves all extremities without noticeable abnormality Normal Gait Normal inspection of back, no obvious scoliosis or leg length descrepancy No bony TTP Soft tissue TTP at: R > L lumbar paraspinal muscles -/+ tests: neg trendelenburg,-facet loading, -SLRT, -CLRT, -FABER, -FADIR Normal muscle strength, sensation to light touch and DTRs in LEs bilaterally  PSYCH: pleasant and cooperative, no obvious depression or anxiety  ASSESSMENT AND PLAN:  Discussed the following assessment and plan:  Bilateral low back pain without sciatica -suspect muscle strain; discussed other possible etiologies of low back pain -opted for muscle relaxer, low back exercises, analgesic prn and close follow up in 1 month  Hyperlipemia - from 2011, 2012 labs - Plan: Lipid panel  -Patient advised to return or notify a doctor immediately if symptoms worsen or persist or new concerns arise.  Patient Instructions  BEFORE YOU LEAVE: -low back exercises -lab for cholesterol check -follow up appointment for low  back pain in 1 month - sooner if needed  Do the exercises at least 4 days per week  Flexeril at night  Aleve if needed per instructions for pain    KIM, HANNAH R.

## 2015-04-13 NOTE — Patient Instructions (Signed)
BEFORE YOU LEAVE: -low back exercises -lab for cholesterol check -follow up appointment for low back pain in 1 month - sooner if needed  Do the exercises at least 4 days per week  Flexeril at night  Aleve if needed per instructions for pain

## 2015-04-28 ENCOUNTER — Ambulatory Visit (INDEPENDENT_AMBULATORY_CARE_PROVIDER_SITE_OTHER)
Admission: RE | Admit: 2015-04-28 | Discharge: 2015-04-28 | Disposition: A | Discharge status: Home / Self Care | Payer: Managed Care, Other (non HMO) | Source: Ambulatory Visit | Attending: Family Medicine | Admitting: Family Medicine

## 2015-04-28 ENCOUNTER — Ambulatory Visit (INDEPENDENT_AMBULATORY_CARE_PROVIDER_SITE_OTHER): Payer: Managed Care, Other (non HMO) | Admitting: Family Medicine

## 2015-04-28 ENCOUNTER — Encounter: Payer: Self-pay | Admitting: Family Medicine

## 2015-04-28 VITALS — BP 100/78 | HR 84 | Temp 98.4°F | Ht 69.0 in | Wt 215.1 lb

## 2015-04-28 DIAGNOSIS — M545 Low back pain, unspecified: Secondary | ICD-10-CM

## 2015-04-28 DIAGNOSIS — M170 Bilateral primary osteoarthritis of knee: Secondary | ICD-10-CM | POA: Diagnosis not present

## 2015-04-28 NOTE — Progress Notes (Signed)
Pre visit review using our clinic review tool, if applicable. No additional management support is needed unless otherwise documented below in the visit note. 

## 2015-04-28 NOTE — Progress Notes (Signed)
HPI:  Low back pain: -started in November 2016 after doing "butt twists" -reports pain in bilat lower back muscles R> L, improving some but still hurts with bending, standing and certain movements -pain now is 0/10 at rest, at worst is 0000000 with certain acitivities -denies: radiation, weakness, numbness, malaise, fevers, dysuria, changes in bowel or bladder function -aleve helps - and only needs 1-2 times per week at this point -she has hx OA of knees, has had arthroscopy in the past, no pain but has crackling in bilat knees chronically  ROS: See pertinent positives and negatives per HPI.  Past Medical History  Diagnosis Date  . Lactose intolerance   . Allergy   . GERD (gastroesophageal reflux disease)     "does not need meds anymore"  . Hyperlipidemia     Past Surgical History  Procedure Laterality Date  . 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 Abdominal hysterectomy  2006    benign tumor, complete  . Knee arthroscopy Right     Family History  Problem Relation Age of Onset  . Arthritis Mother   . Hyperlipidemia Mother   . Stroke Mother   . Hypertension Mother   . Colon cancer Neg Hx     Social History   Social History  . Marital Status: Married    Spouse Name: N/A  . Number of Children: N/A  . Years of Education: N/A   Social History Main Topics  . Smoking status: Never Smoker   . Smokeless tobacco: Never Used  . Alcohol Use: No  . Drug Use: No  . Sexual Activity: Not Asked   Other Topics Concern  . 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  cyclobenzaprine (FLEXERIL) 5 MG tablet, Take 1 tablet (5 mg total) by mouth at bedtime., Disp: 20 tablet, Rfl: 0 .  pravastatin (PRAVACHOL) 20 MG  tablet, TAKE 1 TABLET (20 MG TOTAL) BY MOUTH DAILY., Disp: 30 tablet, Rfl: 3  EXAM:  Filed Vitals:   04/28/15 1059  BP: 100/78  Pulse: 84  Temp: 98.4 F (36.9 C)    Body mass index is 31.75 kg/(m^2).  GENERAL: vitals reviewed and listed above, alert, oriented, appears well hydrated and in no acute distress  HEENT: atraumatic, conjunttiva clear, no obvious abnormalities on inspection of external nose and ears  NECK: no obvious masses on inspection  LUNGS: clear to auscultation bilaterally, no wheezes, rales or rhonchi, good air movement  CV: HRRR, no peripheral edema  MS: moves all extremities without noticeable abnormality. Normal Gait Normal inspection of back, no obvious scoliosis or leg length descrepancy No bony TTP Soft tissue TTP at: bilat lumbar paraspinal muscles - R>L -/+ tests: neg trendelenburg,-facet loading, -SLRT, -CLRT, -FABER, -FADIR Normal muscle strength, sensation to light touch and DTRs in LEs bilaterally Knees without effusion on exam, she has j sign and petallar crepitus bilat, neg lachman, drawer and mcmurray  PSYCH: pleasant and cooperative, no obvious depression or anxiety  ASSESSMENT AND PLAN:  Discussed the following assessment and plan:  Right-sided low back pain without sciatica - Plan: DG Lumbar Spine Complete  Osteoarthritis of both knees, unspecified osteoarthritis type  -we discussed possible serious and likely etiologies, workup and treatment, treatment risks and return precautions suspect OA or muscle strain, objective intake suggest improvement, benign exam -after this discussion, Jeane opted for plain films, formal PT pending these results, MRI and likely referral if persists in 4 weeks - Aleve 1-2 times per week is helping pain currently so will continue -PFS exercises for knees, she is not having any pain -follow up advised in 4 weeks -of course, we advised Xaviana  to return or notify a doctor immediately if symptoms worsen or  persist or new concerns arise.  .  -Patient advised to return or notify a doctor immediately if symptoms worsen or persist or new concerns arise.  Patient Instructions  BEFORE YOU LEAVE: -xray sheet -patellofemoral knee exercises -follow up in 4 weeks  Go get xray of the back - will plan physical therapy once we get these results       KIM, Jarrett Soho R.

## 2015-04-28 NOTE — Patient Instructions (Signed)
BEFORE YOU LEAVE: -xray sheet -patellofemoral knee exercises -follow up in 4 weeks  Go get xray of the back - will plan physical therapy once we get these results

## 2015-04-29 ENCOUNTER — Other Ambulatory Visit: Payer: Self-pay | Admitting: *Deleted

## 2015-04-29 DIAGNOSIS — M5136 Other intervertebral disc degeneration, lumbar region: Secondary | ICD-10-CM

## 2015-04-30 ENCOUNTER — Other Ambulatory Visit: Payer: Self-pay | Admitting: Family Medicine

## 2015-05-04 ENCOUNTER — Ambulatory Visit: Payer: Managed Care, Other (non HMO) | Attending: Family Medicine

## 2015-05-04 DIAGNOSIS — M545 Low back pain, unspecified: Secondary | ICD-10-CM

## 2015-05-04 NOTE — Patient Instructions (Signed)
Perform all exercises below:  Hold _20___ seconds. Repeat _3___ times.  Do __3__ sessions per day. CAUTION: Movement should be gentle, steady and slow.  Knee to Chest  Lying supine, bend involved knee to chest. Perform with each leg.  Piriformis (Supine)  Cross legs, right on top. Gently pull other knee toward chest until stretch is felt in buttock/hip of top leg. Hold ____ seconds. Repeat ____ times per set. Do ____ sets per session. Do ____ sessions per day.  Hip Stretch  Put right ankle over left knee. Let right knee fall downward, but keep ankle in place. Feel the stretch in hip. May push down gently with hand to feel stretch. Hold __20__ seconds while counting out loud. Repeat with other leg. Repeat 3____ times. Do __3__ sessions per day.    Stretching: Piriformis (Supine)  Pull right knee toward opposite shoulder. Hold _20___ seconds. Relax. Repeat _3___ times per set. Do _1___ sets per session. Do _3___ sessions per day.    Lumbar Rotation: Caudal - Bilateral (Supine)  Feet and knees together, arms outstretched, rotate knees left, turning head in opposite direction, until stretch is felt.      HIP: Hamstrings - Short Sitting   Rest leg on raised surface. Keep knee straight. Lift chest.   Cervico-Thoracic: Extension / Rotation (Sitting)    Reach across body with left arm and grasp back of chair. Gently look over right side shoulder. Hold __20__ seconds. Relax. Repeat _3___ times per set. Do _1___ sets per session. Do __3__ sessions per day.  http://orth.exer.us/981   Copyright  VHI. All rights reserved.    [facility] [address], [place], [place] [address] Phone # [phone] Fax [phone]

## 2015-05-04 NOTE — Therapy (Signed)
[facility] [address] [place], [place], [address] Phone: [phone]   Fax:  [phone]  Physical Therapy Evaluation  Patient Details  Name: Deanna [name] MRN: [id number] Date of Birth: [birth year] Referring Provider: Colin Benton, MD  Encounter Date: 05/04/2015      PT End of Session - 05/04/15 1131    Visit Number 1   Date for PT Re-Evaluation 06/29/15   PT Start Time 1100   PT Stop Time 1140   PT Time Calculation (min) 40 min   Activity Tolerance Patient tolerated treatment well   Behavior During Therapy [facility] for tasks assessed/performed      Past Medical History  Diagnosis Date  . Lactose intolerance   . Allergy   . GERD (gastroesophageal reflux disease)     "does not need meds anymore"  . Hyperlipidemia     Past Surgical History  Procedure Laterality Date  . 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 Abdominal hysterectomy  2006    benign tumor, complete  . Knee arthroscopy Right     There were no vitals filed for this visit.  Visit Diagnosis:  Bilateral low back pain without sciatica - Plan: PT plan of care cert/re-cert      Subjective Assessment - 05/04/15 1105    Subjective Pt is a 54 y.o. female who presents to PT with complaints of bilateral LBP that began at the end of October without known injury.  Pt reports that she was performing stretches at home and this might have been a contributing factor.  Pt reports that MD gave her stretches and flexeril ot help with pain,   Pertinent History none   Limitations Sitting   How long can you sit comfortably? 1 hour max   Diagnostic tests x-ray: no acute subluxation or fracture, DDD L5-S1   Patient Stated Goals reduce pain, less stiffness in the morning hours, less stiffness/pain after sitting long hours   Currently in Pain? 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  No   Is the patient reluctant to leave their home because of a fear of falling?  No   Home Environment   Living Environment Private residence   Living Arrangements Other relatives   Home Access Level entry   Home Layout One level   Prior Function   Level of Independence Independent   Vocation Part time employment   Scientist, research (physical sciences)    Cognition   Overall Cognitive  Status Within Functional Limits for tasks assessed   Observation/Other Assessments   Focus on Therapeutic Outcomes (FOTO)  50% limitation   Posture/Postural Control   Posture/Postural Control Postural limitations   Postural Limitations Decreased lumbar lordosis   ROM / Strength   AROM / PROM / Strength AROM;PROM;Strength   AROM   Overall AROM  Within functional limits for tasks performed   Overall AROM Comments Full lumbar AROM with reduced segmental mobility in the lumbar and thoracic segments with flexion and bil. sidebending.     PROM   Overall PROM  Within functional limits for tasks performed   Overall PROM Comments normal hip flexiblility with stiffness reported at end range.   Strength   Overall Strength Within functional limits for tasks performed   Overall Strength  Comments 5/5 bilateral LE strength   Palpation   Spinal mobility 25% reduction in PA mobiltiy T10-L5.  Pain with PA glides L3-4   Palpation comment tension and pain with palpation over bilateral lumbar paraspinals.   Ambulation/Gait   Ambulation/Gait Yes   Ambulation/Gait Assistance 7: Independent                           PT Education - 05/04/15 1131    Education provided Yes   Education Details HEP: hip and lumbar flexibility   Person(s) Educated Patient   Methods Explanation;Demonstration;Handout   Comprehension Verbalized understanding;Returned demonstration          PT Short Term Goals - 05/04/15 1141    PT SHORT TERM GOAL #1   Title be independent in initial HEP   Time 4   Period Weeks   Status New   PT SHORT TERM GOAL #2   Title report 30% reduction in LBP and stiffness after sitting long periods for work   Time 4   Period Weeks   Status New   PT SHORT TERM GOAL #3   Title report 30% less lumbar pain in the morning hours   Time 4   Period Weeks   Status New           PT Long Term Goals - 05/04/15 1101    PT LONG TERM GOAL #1   Title be independent in advanced HEP   Time 8   Period Weeks   Status New   PT LONG TERM GOAL #2   Title reduce FOTO to < or = to 32% limitation   Time 8   Period Weeks   Status New   PT LONG TERM GOAL #3   Title demonstrate correct body mechanics modifications for ADLs and home tasks to reduce lumbar stress   Time 8   Period Weeks   Status New   PT LONG TERM GOAL #4   Title report a 75% reduction in LBP with sitting long periods   Time 8   Period Weeks   Status New   PT LONG TERM GOAL #5   Title report < or = to 3/10 LBP with ADLs and self-care   Time 8   Period Weeks   Status New               Plan - 05/04/15 1138    Clinical Impression Statement Pt presents to PT with complaints of bilateral LBP that began 02/2015 without cause.  Pt has been stretching 3x/week and taking muscle relaxers  per MD.  x-ray showed lumbar dDD L5-S1.  Pt demonstrates painful mobility, palpable tenderness and limited  segmental mobility in the lumbar spine  and FOTO score is 50% limitation.  Pt will beneift from skilled PT for body mechanics education, flexibility/strength progression and manual/modaliteis PRN   Pt will benefit from skilled therapeutic intervention in order to improve on the following deficits Pain;Decreased activity tolerance;Increased muscle spasms   Rehab Potential Good   PT Frequency 1x / week   PT Duration 8 weeks   PT Treatment/Interventions ADLs/Self Care Home Management;Cryotherapy;Electrical Stimulation;Moist Heat;Therapeutic exercise;Therapeutic activities;Ultrasound;Neuromuscular re-education;Patient/family education;Manual techniques;Taping;Passive range of motion   PT Next Visit Plan Body mechanics education, manual to bilateral lumbar parapsinals, core strength, hip flexibility, modalities   Consulted and Agree with Plan of Care Patient         Problem List Patient Active Problem List   Diagnosis Date Noted  . Hyperglycemia 01/27/2014  . Obesity 01/27/2014  . Thumb pain 01/27/2014  . Unspecified vitamin D deficiency 09/11/2012  . Hyperlipemia - from 2011, 2012 labs 09/11/2012  . History of high platelet count - on labs 2011, 2012 09/11/2012    Mark Hassey, PT 05/04/2015, 11:46 AM  [facility] [address] [place], [place], [address] Phone: [phone]   Fax:  [phone]  Name: PINKEY KILLEEN MRN: [id number] Date of Birth: 10/23/1960

## 2015-05-12 ENCOUNTER — Encounter: Payer: Self-pay | Admitting: Physical Therapy

## 2015-05-12 ENCOUNTER — Ambulatory Visit: Payer: Managed Care, Other (non HMO) | Admitting: Physical Therapy

## 2015-05-12 DIAGNOSIS — M545 Low back pain, unspecified: Secondary | ICD-10-CM

## 2015-05-12 NOTE — Therapy (Signed)
[facility] [address], [place], [place], [address] Phone: [phone]   Fax:  [phone]  Physical Therapy Treatment  Patient Details  Name: Deanna [name] MRN: [id number] Date of Birth: 1960-12-16 Referring Provider: Colin Benton, MD  Encounter Date: 05/12/2015      PT End of Session - 05/12/15 1448    Visit Number 2   Date for PT Re-Evaluation 06/29/15   PT Start Time 1440   PT Stop Time 1530   PT Time Calculation (min) 50 min   Activity Tolerance Patient tolerated treatment well   Behavior During Therapy [facility] for tasks assessed/performed      Past Medical History  Diagnosis Date  . Lactose intolerance   . Allergy   . GERD (gastroesophageal reflux disease)     "does not need meds anymore"  . Hyperlipidemia     Past Surgical History  Procedure Laterality Date  . 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 Abdominal hysterectomy  2006    benign tumor, complete  . Knee arthroscopy Right     There were no vitals filed for this visit.  Visit Diagnosis:  Bilateral low back pain without sciatica      Subjective Assessment - 05/12/15 1447    Subjective Drove an 1 1/2 hrs the other day and this was painful. Doing stretches, feels good after I finish.    Currently in Pain? No/denies   Multiple Pain Sites No                         [facility] Adult PT Treatment/Exercise - 05/12/15 0001    Lumbar Exercises: Stretches   Active Hamstring Stretch 2 reps;20 seconds   Single Knee to Chest Stretch 3 reps;20 seconds   Lower Trunk Rotation 3 reps;20 seconds   Pelvic Tilt --  10x 5 sec hold   Piriformis Stretch 2 reps;10 seconds   Lumbar Exercises: Aerobic   Stationary Bike Nustep L1 x 6 min   Lumbar Exercises: Standing   Other Standing Lumbar Exercises weight shift 3 ways 1 min   emphasis on posture & TA   Moist Heat Therapy   Number Minutes Moist Heat --  In supine for exercises   Moist Heat Location Lumbar Spine                PT Education - 05/12/15 1452    Education provided Yes   Education Details Body mechanics ed   Person(s) Educated Patient   Methods Explanation;Demonstration;Handout   Comprehension Verbalized understanding          PT Short Term Goals - 05/12/15 1455    PT SHORT TERM GOAL #1   Title be independent in initial HEP   Time 4   Period Weeks   Status Achieved   PT SHORT TERM GOAL #2   Title report 30% reduction in LBP and stiffness after sitting long periods for work   Time 4   Period Weeks   Status On-going  Too early   PT SHORT TERM GOAL #3   Title report 30% less lumbar pain in the morning hours   Time 4   Period Weeks   Status On-going  too early           PT Long Term Goals - 05/04/15 1101    PT LONG TERM GOAL #1   Title be independent in advanced HEP   Time 8  Period Weeks   Status New   PT LONG TERM GOAL #2   Title reduce FOTO to < or = to 32% limitation   Time 8   Period Weeks   Status New   PT LONG TERM GOAL #3   Title demonstrate correct body mechanics modifications for ADLs and home tasks to reduce lumbar stress   Time 8   Period Weeks   Status New   PT LONG TERM GOAL #4   Title report a 75% reduction in LBP with sitting long periods   Time 8   Period Weeks   Status New   PT LONG TERM GOAL #5   Title report < or = to 3/10 LBP with ADLs and self-care   Time 8   Period Weeks   Status New               Plan - 05/12/15 1515    Clinical Impression Statement First full treatment session today. Pt performed her HEP stretches very well is compliant in doing them. Added some cardio conditioning as well as Arts development officer ed for ADLS. Too early for goals.    Pt will benefit from skilled therapeutic intervention in order to improve on the following deficits Pain;Decreased activity tolerance;Increased muscle spasms   Rehab Potential Good   PT Frequency 1x / week   PT Duration 8 weeks   PT  Treatment/Interventions ADLs/Self Care Home Management;Cryotherapy;Electrical Stimulation;Moist Heat;Therapeutic exercise;Therapeutic activities;Ultrasound;Neuromuscular re-education;Patient/family education;Manual techniques;Taping;Passive range of motion   PT Next Visit Plan Begin core strength, try estim if she has pain.    Consulted and Agree with Plan of Care Patient        Problem List Patient Active Problem List   Diagnosis Date Noted  . Hyperglycemia 01/27/2014  . Obesity 01/27/2014  . Thumb pain 01/27/2014  . Unspecified vitamin D deficiency 09/11/2012  . Hyperlipemia - from 2011, 2012 labs 09/11/2012  . History of high platelet count - on labs 2011, 2012 09/11/2012    Eulonda Andalon, PTA 05/12/2015, 3:21 PM  [facility] [address] [place], [place], [address] Phone: [phone]   Fax:  [phone]  Name: Deanna [name] MRN: [id number] Date of Birth: 1960-09-29

## 2015-05-12 NOTE — Patient Instructions (Signed)

## 2015-05-19 ENCOUNTER — Encounter: Payer: Self-pay | Admitting: Physical Therapy

## 2015-05-19 ENCOUNTER — Ambulatory Visit: Payer: Managed Care, Other (non HMO) | Attending: Family Medicine | Admitting: Physical Therapy

## 2015-05-19 DIAGNOSIS — M545 Low back pain, unspecified: Secondary | ICD-10-CM

## 2015-05-19 NOTE — Therapy (Signed)
[facility] [address], [place], [place], [address] Phone: [phone]   Fax:  [phone]  Physical Therapy Treatment  Patient Details  Name: Deanna [name] MRN: [id number] Date of Birth: 1960/06/10 Referring Provider: Colin Benton, MD  Encounter Date: 05/19/2015      PT End of Session - 05/19/15 1156    Visit Number 3   Date for PT Re-Evaluation 06/29/15   PT Start Time 9937   PT Stop Time 1230   PT Time Calculation (min) 45 min   Activity Tolerance Patient tolerated treatment well   Behavior During Therapy [facility] for tasks assessed/performed      Past Medical History  Diagnosis Date  . Lactose intolerance   . Allergy   . GERD (gastroesophageal reflux disease)     "does not need meds anymore"  . Hyperlipidemia     Past Surgical History  Procedure Laterality Date  . 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 Abdominal hysterectomy  2006    benign tumor, complete  . Knee arthroscopy Right     There were no vitals filed for this visit.  Visit Diagnosis:  Bilateral low back pain without sciatica      Subjective Assessment - 05/19/15 1155    Subjective Being mindful of my posture and steching and moving is making a big difference.    Currently in Pain? No/denies   Multiple Pain Sites No                         [facility] Adult PT Treatment/Exercise - 05/19/15 0001    Lumbar Exercises: Stretches   Single Knee to Chest Stretch 2 reps;30 seconds   Lower Trunk Rotation 2 reps;30 seconds   Pelvic Tilt --  10x 5 sec hold   Piriformis Stretch 2 reps;30 seconds   Lumbar Exercises: Aerobic   Stationary Bike L1 x 8 min   UBE (Upper Arm Bike) Sitting on blue ball L1 3x3   Lumbar Exercises: Supine   Ab Set --  with red band horizontal abd 2 x10   Bridge 10 reps;2 seconds                  PT Short Term Goals - 05/19/15 1157    PT SHORT TERM GOAL #2   Title report 30%  reduction in LBP and stiffness after sitting long periods for work   Time 4   Period Weeks   Status Achieved  90%   PT SHORT TERM GOAL #3   Title report 30% less lumbar pain in the morning hours   Time 4   Period Weeks   Status Achieved  90%           PT Long Term Goals - 05/04/15 1101    PT LONG TERM GOAL #1   Title be independent in advanced HEP   Time 8   Period Weeks   Status New   PT LONG TERM GOAL #2   Title reduce FOTO to < or = to 32% limitation   Time 8   Period Weeks   Status New   PT LONG TERM GOAL #3   Title demonstrate correct body mechanics modifications for ADLs and home tasks to reduce lumbar stress   Time 8   Period Weeks   Status New   PT LONG TERM GOAL #4   Title report a 75% reduction in LBP with sitting long periods  Time 8   Period Weeks   Status New   PT LONG TERM GOAL #5   Title report < or = to 3/10 LBP with ADLs and self-care   Time 8   Period Weeks   Status New               Plan - 05/19/15 1209    Clinical Impression Statement Pt reports that supporting her back more often and moving more throughout the day has greatly helped. She reports her pain is about 90% decreased. We added strengthening exercises today like the bridge to advance her HEP. StTGs met for pain.    Pt will benefit from skilled therapeutic intervention in order to improve on the following deficits Pain;Decreased activity tolerance;Increased muscle spasms   PT Frequency 1x / week   PT Duration 8 weeks   PT Treatment/Interventions ADLs/Self Care Home Management;Cryotherapy;Electrical Stimulation;Moist Heat;Therapeutic exercise;Therapeutic activities;Ultrasound;Neuromuscular re-education;Patient/family education;Manual techniques;Taping;Passive range of motion   PT Next Visit Plan Continue with core strength   Consulted and Agree with Plan of Care Patient        Problem List Patient Active Problem List   Diagnosis Date Noted  . Hyperglycemia 01/27/2014   . Obesity 01/27/2014  . Thumb pain 01/27/2014  . Unspecified vitamin D deficiency 09/11/2012  . Hyperlipemia - from 2011, 2012 labs 09/11/2012  . History of high platelet count - on labs 2011, 2012 09/11/2012    Gibbs Naugle, PTA 05/19/2015, 12:21 PM  [facility] [address] [place], [place], [address] Phone: [phone]   Fax:  [phone]  Name: Deanna [name] MRN: [id number] Date of Birth: [birth year]

## 2015-05-26 ENCOUNTER — Ambulatory Visit: Payer: Managed Care, Other (non HMO) | Admitting: Physical Therapy

## 2015-05-26 ENCOUNTER — Encounter: Payer: Self-pay | Admitting: Physical Therapy

## 2015-05-26 DIAGNOSIS — M545 Low back pain, unspecified: Secondary | ICD-10-CM

## 2015-05-26 NOTE — Therapy (Addendum)
[facility] [address] [place], [place], [address] Phone: [phone]   Fax:  [phone]  Physical Therapy Treatment  Patient Details  Name: Deanna [name] MRN: [id number] Date of Birth: August 06, 1960 Referring Provider: Colin Benton, MD  Encounter Date: 05/26/2015      PT End of Session - 05/26/15 1157    Visit Number 4   Date for PT Re-Evaluation 06/29/15   PT Start Time 1143   PT Stop Time 1221   PT Time Calculation (min) 38 min   Activity Tolerance Patient tolerated treatment well   Behavior During Therapy [facility] for tasks assessed/performed      Past Medical History  Diagnosis Date  . Lactose intolerance   . Allergy   . GERD (gastroesophageal reflux disease)     "does not need meds anymore"  . Hyperlipidemia     Past Surgical History  Procedure Laterality Date  . 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 Abdominal hysterectomy  2006    benign tumor, complete  . Knee arthroscopy Right     There were no vitals filed for this visit.  Visit Diagnosis:  Bilateral low back pain without sciatica      Subjective Assessment - 05/26/15 1149    Subjective Slept potentially funny and my RT thoarcic felt loacked up. Better after my shower and moving.   Currently in Pain? No/denies   Multiple Pain Sites No                         [facility] Adult PT Treatment/Exercise - 05/26/15 0001    Lumbar Exercises: Stretches   Lower Trunk Rotation 2 reps;30 seconds   Lumbar Exercises: Aerobic   Stationary Bike L1 x 10 min   UBE (Upper Arm Bike) Sitting on blue ball L1 3x3   Lumbar Exercises: Supine   Bent Knee Raise 20 reps   Bridge 20 reps;3 seconds   Other Supine Lumbar Exercises Spine decompression on soft foam roll  Horizontal abd/add on roll 10x                   PT Short Term Goals - 05/19/15 1157    PT SHORT TERM GOAL #2   Title report 30% reduction in LBP and stiffness  after sitting long periods for work   Time 4   Period Weeks   Status Achieved  90%   PT SHORT TERM GOAL #3   Title report 30% less lumbar pain in the morning hours   Time 4   Period Weeks   Status Achieved  90%           PT Long Term Goals - 05/26/15 1158    PT LONG TERM GOAL #3   Title demonstrate correct body mechanics modifications for ADLs and home tasks to reduce lumbar stress   Time 8   Period Weeks   Status Achieved   PT LONG TERM GOAL #4   Title report a 75% reduction in LBP with sitting long periods   Time 8   Period Weeks   Status Achieved  90%               Plan - 05/26/15 1210    Clinical Impression Statement Continues to do well with her exercises and most everything regarding her low back. She has not been back to school due to weather closings so she has not challenged her back at  work yet.    Pt will benefit from skilled therapeutic intervention in order to improve on the following deficits Pain;Decreased activity tolerance;Increased muscle spasms   Rehab Potential Good   PT Frequency 1x / week   PT Duration 8 weeks   PT Treatment/Interventions ADLs/Self Care Home Management;Cryotherapy;Electrical Stimulation;Moist Heat;Therapeutic exercise;Therapeutic activities;Ultrasound;Neuromuscular re-education;Patient/family education;Manual techniques;Taping;Passive range of motion   PT Next Visit Plan Continue with core strength   Consulted and Agree with Plan of Care Patient        Problem List Patient Active Problem List   Diagnosis Date Noted  . Hyperglycemia 01/27/2014  . Obesity 01/27/2014  . Thumb pain 01/27/2014  . Unspecified vitamin D deficiency 09/11/2012  . Hyperlipemia - from 2011, 2012 labs 09/11/2012  . History of high platelet count - on labs 2011, 2012 09/11/2012    Deanna [name], PTA 05/26/2015, 12:12 PM PHYSICAL THERAPY DISCHARGE SUMMARY  Visits from Start of Care: 4  Current functional level related to goals /  functional outcomes: See above for most current status.  Pt called to cancel all remaining appts as MD told her not to return to PT.   Remaining deficits: See above   Education / Equipment: HEP, body mechanics education Plan: Patient agrees to discharge.  Patient goals were partially met. Patient is being discharged due to the physician's request.  ?????   Deanna [name], PT 06/01/2015 10:09 AM   [name] [address] [place], [place], [address] Phone: [phone]   Fax:  [phone]  Name: Deanna [name] MRN: [id number] Date of Birth: Oct 08, 1960

## 2015-05-28 ENCOUNTER — Ambulatory Visit (INDEPENDENT_AMBULATORY_CARE_PROVIDER_SITE_OTHER)
Admission: RE | Admit: 2015-05-28 | Discharge: 2015-05-28 | Disposition: A | Discharge status: Home / Self Care | Payer: Managed Care, Other (non HMO) | Source: Ambulatory Visit | Attending: Family Medicine | Admitting: Family Medicine

## 2015-05-28 ENCOUNTER — Ambulatory Visit (INDEPENDENT_AMBULATORY_CARE_PROVIDER_SITE_OTHER): Payer: Managed Care, Other (non HMO) | Admitting: Family Medicine

## 2015-05-28 ENCOUNTER — Other Ambulatory Visit: Payer: Self-pay | Admitting: Family Medicine

## 2015-05-28 ENCOUNTER — Encounter: Payer: Self-pay | Admitting: Family Medicine

## 2015-05-28 VITALS — BP 118/82 | HR 75 | Temp 98.1°F | Ht 69.0 in | Wt 218.8 lb

## 2015-05-28 DIAGNOSIS — J111 Influenza due to unidentified influenza virus with other respiratory manifestations: Secondary | ICD-10-CM

## 2015-05-28 MED ORDER — PREDNISONE 20 MG PO TABS: 40.0000 mg | ORAL_TABLET | Freq: Every day | ORAL | Status: DC

## 2015-05-28 NOTE — Progress Notes (Signed)
HPI:   URI: -started: 1 week ago with flu like symptoms - fever to 102, body aches, tons of clear nasal congestion, cough, nausea - mostly better with fever resolved, but persistent cough, mild SOB and mild chest discomfort when taking breath in somtimes -sick contacts/travel/risks: denies flu exposure, travel, persistent SOB, persistent fevers, hemoptysis, sinus pain -Hx of: allergies -reports hx CAP with Flu in the past    ROS: See pertinent positives and negatives per HPI.  Past Medical History  Diagnosis Date  . Lactose intolerance   . Allergy   . GERD (gastroesophageal reflux disease)     "does not need meds anymore"  . Hyperlipidemia     Past Surgical History  Procedure Laterality Date  . 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 Abdominal hysterectomy  2006    benign tumor, complete  . Knee arthroscopy Right     Family History  Problem Relation Age of Onset  . Arthritis Mother   . Hyperlipidemia Mother   . Stroke Mother   . Hypertension Mother   . Colon cancer Neg Hx     Social History   Social History  . Marital Status: Married    Spouse Name: N/A  . Number of Children: N/A  . Years of Education: N/A   Social History Main Topics  . Smoking status: Never Smoker   . Smokeless tobacco: Never Used  . Alcohol Use: No  . Drug Use: No  . Sexual Activity: Not Asked   Other Topics Concern  . 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  cyclobenzaprine (FLEXERIL) 5 MG tablet, TAKE 1 TABLET BY MOUTH AT BEDTIME, Disp: 20 tablet, Rfl: 0 .  pravastatin (PRAVACHOL) 20 MG tablet, TAKE 1 TABLET (20 MG TOTAL) BY MOUTH DAILY., Disp: 30 tablet, Rfl: 3  EXAM:  Filed Vitals:   05/28/15 1040  BP:  118/82  Pulse: 75  Temp: 98.1 F (36.7 C)    Body mass index is 32.3 kg/(m^2).  GENERAL: vitals reviewed and listed above, alert, oriented, appears well hydrated and in no acute distress  HEENT: atraumatic, conjunttiva clear, no obvious abnormalities on inspection of external nose and ears, normal appearance of ear canals and TMs, clear nasal congestion, mild post oropharyngeal erythema with PND, no tonsillar edema or exudate, no sinus TTP  NECK: no obvious masses on inspection  LUNGS: clear to auscultation bilaterally, no wheezes, rales or rhonchi, good air movement  CV: HRRR, no peripheral edema  MS: moves all extremities without noticeable abnormality  PSYCH: pleasant and cooperative, no obvious depression or anxiety  ASSESSMENT AND PLAN:  Discussed the following assessment and plan:  Influenza with respiratory manifestation - Plan: DG Chest 2 View  -given HPI and exam findings today, a serious infection or illness is unlikely. We discussed potential etiologies, with influenza or influenza like illness being most likely. We discussed treatment side effects, likely course, antibiotic misuse, transmission, and signs of developing a serious illness. Since seems to be resolving and ongoing for 1 week tamiflu would not help at this point. Will check CXR and tx with  abx if pneumonia. Otherwise may try inhaler or prednisone if bronchitic symptoms persist. -of course, we advised to return or notify a doctor immediately if symptoms worsen or persist or new concerns arise.    There are no Patient Instructions on file for this visit.   Colin Benton R.

## 2015-05-28 NOTE — Progress Notes (Signed)
Pre visit review using our clinic review tool, if applicable. No additional management support is needed unless otherwise documented below in the visit note. 

## 2015-05-28 NOTE — Patient Instructions (Signed)
BEFORE YOU LEAVE: -xray sheet  Go to get chest xray now  If not pneumonia may treat with prednisone to help with the likely bronchitis - if pneumonia will need to do antibiotic

## 2015-06-02 ENCOUNTER — Encounter: Payer: Managed Care, Other (non HMO) | Admitting: Physical Therapy

## 2015-07-06 ENCOUNTER — Telehealth: Payer: Self-pay | Admitting: Family Medicine

## 2015-07-06 NOTE — Telephone Encounter (Signed)
Patient ask if you could call something in to her pharmacy for her coughing, send to [facility] on [address]

## 2015-07-06 NOTE — Telephone Encounter (Signed)
Pt would like a call back when you can. Thanks!

## 2015-07-06 NOTE — Telephone Encounter (Signed)
She can try over the counter options such as cough drops, musinex, delsym, etc if simply a cough with a mild cold. However, if is requiring stronger medication or this is persistent from last month would advise OV to evaluate/listen to lungs.

## 2015-07-06 NOTE — Telephone Encounter (Signed)
I left a detailed message with the information below at the pts cell number. 

## 2015-07-07 ENCOUNTER — Ambulatory Visit (INDEPENDENT_AMBULATORY_CARE_PROVIDER_SITE_OTHER): Payer: Managed Care, Other (non HMO) | Admitting: Family Medicine

## 2015-07-07 ENCOUNTER — Encounter: Payer: Self-pay | Admitting: Family Medicine

## 2015-07-07 VITALS — BP 120/72 | HR 81 | Temp 98.7°F | Ht 69.0 in | Wt 219.6 lb

## 2015-07-07 DIAGNOSIS — R6889 Other general symptoms and signs: Secondary | ICD-10-CM

## 2015-07-07 NOTE — Progress Notes (Signed)
Pre visit review using our clinic review tool, if applicable. No additional management support is needed unless otherwise documented below in the visit note. 

## 2015-07-07 NOTE — Progress Notes (Signed)
HPI:  Flu like symptoms: -started: 4 days ago with sudden onset of fever, body aches, nasal congestion, cough and malaise. She has improved significantly with resolution of the fevers the last several days, but has persistent cough and nasal congestion. -denies: SOB, NVD, tooth pain, sinus pain -has tried: Humidifier, codeine cough syrup -sick contacts/travel/risks: Flu has been going around her job site -Hx of: allergies ROS: See pertinent positives and negatives per HPI.  Past Medical History  Diagnosis Date  . Lactose intolerance   . Allergy   . GERD (gastroesophageal reflux disease)     "does not need meds anymore"  . Hyperlipidemia     Past Surgical History  Procedure Laterality Date  . 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 Abdominal hysterectomy  2006    benign tumor, complete  . Knee arthroscopy Right     Family History  Problem Relation Age of Onset  . Arthritis Mother   . Hyperlipidemia Mother   . Stroke Mother   . Hypertension Mother   . Colon cancer Neg Hx     Social History   Social History  . Marital Status: Married    Spouse Name: N/A  . Number of Children: N/A  . Years of Education: N/A   Social History Main Topics  . Smoking status: Never Smoker   . Smokeless tobacco: Never Used  . Alcohol Use: No  . Drug Use: No  . Sexual Activity: Not Asked   Other Topics Concern  . 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  pravastatin (PRAVACHOL) 20 MG tablet, TAKE 1 TABLET (20 MG TOTAL) BY MOUTH DAILY., Disp: 30 tablet, Rfl: 3  EXAM:  Filed Vitals:   07/07/15 1103  BP: 120/72  Pulse: 81  Temp: 98.7 F (37.1 C)    Body mass index is 32.41 kg/(m^2).  GENERAL: vitals  reviewed and listed above, alert, oriented, appears well hydrated and in no acute distress  HEENT: atraumatic, conjunttiva clear, no obvious abnormalities on inspection of external nose and ears, normal appearance of ear canals and TMs, clear nasal congestion, mild post oropharyngeal erythema with PND, no tonsillar edema or exudate, no sinus TTP  NECK: no obvious masses on inspection  LUNGS: clear to auscultation bilaterally, no wheezes, rales or rhonchi, good air movement  CV: HRRR, no peripheral edema  MS: moves all extremities without noticeable abnormality  PSYCH: pleasant and cooperative, no obvious depression or anxiety  ASSESSMENT AND PLAN:  Discussed the following assessment and plan:  Influenza-like symptoms  -Suspected flu, now on the mend. No signs or findings on exam today to suggest a secondary bacterial infection at this time. Discussed supportive care measures, transmission, expected course and return precautions. -She has tried several over-the-counter and prescription cough medications in the past do not work for her. I think her current cough is triggered by postnasal drip and the opted to treat this with a short course of nasal decongestant, warned of rebound congestion. -of course, we advised to return or notify a doctor immediately if symptoms worsen or persist or new concerns arise.  There are no Patient Instructions on file for this visit.   Colin Benton R.

## 2015-08-01 ENCOUNTER — Other Ambulatory Visit: Payer: Self-pay | Admitting: Family Medicine

## 2015-10-20 DIAGNOSIS — Z7689 Persons encountering health services in other specified circumstances: Secondary | ICD-10-CM

## 2015-10-21 ENCOUNTER — Telehealth: Payer: Self-pay | Admitting: *Deleted

## 2015-10-21 NOTE — Telephone Encounter (Signed)
Or the last office visit if she would like to.  I left a message for the pt to return my call regarding the form.

## 2015-10-21 NOTE — Telephone Encounter (Signed)
Dr Maudie Mercury received a form that the pt dropped off to be completed and stated to enter the numbers from her last physical

## 2015-10-26 NOTE — Telephone Encounter (Signed)
Deanna [name] spoke with the pt today and she stated she would like to have the form completed with the information from her last visit on 11/29 and this was completed and given to [place].

## 2015-11-02 ENCOUNTER — Encounter: Payer: Managed Care, Other (non HMO) | Admitting: Family Medicine

## 2015-12-02 ENCOUNTER — Other Ambulatory Visit (HOSPITAL_COMMUNITY)
Admission: RE | Admit: 2015-12-02 | Discharge: 2015-12-02 | Disposition: A | Discharge status: Home / Self Care | Payer: Managed Care, Other (non HMO) | Source: Ambulatory Visit | Attending: Family Medicine | Admitting: Family Medicine

## 2015-12-02 ENCOUNTER — Ambulatory Visit (INDEPENDENT_AMBULATORY_CARE_PROVIDER_SITE_OTHER): Payer: Managed Care, Other (non HMO) | Admitting: Family Medicine

## 2015-12-02 ENCOUNTER — Encounter: Payer: Self-pay | Admitting: Family Medicine

## 2015-12-02 VITALS — BP 118/82 | HR 68 | Temp 98.2°F | Ht 68.25 in | Wt 225.0 lb

## 2015-12-02 DIAGNOSIS — R739 Hyperglycemia, unspecified: Secondary | ICD-10-CM | POA: Diagnosis not present

## 2015-12-02 DIAGNOSIS — Z23 Encounter for immunization: Secondary | ICD-10-CM | POA: Diagnosis not present

## 2015-12-02 DIAGNOSIS — Z01419 Encounter for gynecological examination (general) (routine) without abnormal findings: Secondary | ICD-10-CM | POA: Diagnosis present

## 2015-12-02 DIAGNOSIS — Z9081 Acquired absence of spleen: Secondary | ICD-10-CM | POA: Insufficient documentation

## 2015-12-02 DIAGNOSIS — E785 Hyperlipidemia, unspecified: Secondary | ICD-10-CM

## 2015-12-02 DIAGNOSIS — Z6833 Body mass index (BMI) 33.0-33.9, adult: Secondary | ICD-10-CM

## 2015-12-02 DIAGNOSIS — Z1151 Encounter for screening for human papillomavirus (HPV): Secondary | ICD-10-CM | POA: Insufficient documentation

## 2015-12-02 DIAGNOSIS — Z124 Encounter for screening for malignant neoplasm of cervix: Secondary | ICD-10-CM

## 2015-12-02 DIAGNOSIS — Z0000 Encounter for general adult medical examination without abnormal findings: Secondary | ICD-10-CM

## 2015-12-02 LAB — BASIC METABOLIC PANEL
BUN: 11 mg/dL (ref 6–23)
CO2: 29 mEq/L (ref 19–32)
Calcium: 10 mg/dL (ref 8.4–10.5)
Chloride: 103 mEq/L (ref 96–112)
Creatinine, Ser: 0.82 mg/dL (ref 0.40–1.20)
GFR: 93.84 mL/min (ref >60.00)
Glucose, Bld: 81 mg/dL (ref 70–99)
Potassium: 4.3 mEq/L (ref 3.5–5.1)
Sodium: 139 mEq/L (ref 135–145)

## 2015-12-02 LAB — CBC
HCT: 36.6 % (ref 36.0–46.0)
Hemoglobin: 12 g/dL (ref 12.0–15.0)
MCHC: 32.7 g/dL (ref 30.0–36.0)
MCV: 86 fl (ref 78.0–100.0)
Platelets: 422 10*3/uL — ABNORMAL HIGH (ref 150.0–400.0)
RBC: 4.26 Mil/uL (ref 3.87–5.11)
RDW: 13.9 % (ref 11.5–15.5)
WBC: 7.1 10*3/uL (ref 4.0–10.5)

## 2015-12-02 LAB — LIPID PANEL
Cholesterol: 196 mg/dL (ref 0–200)
HDL: 37.5 mg/dL — ABNORMAL LOW (ref >39.00)
LDL Cholesterol: 136 mg/dL — ABNORMAL HIGH (ref 0–99)
NonHDL: 158.8
Total CHOL/HDL Ratio: 5
Triglycerides: 115 mg/dL (ref 0.0–149.0)
VLDL: 23 mg/dL (ref 0.0–40.0)

## 2015-12-02 LAB — HEMOGLOBIN A1C: Hgb A1c MFr Bld: 5.9 % (ref 4.6–6.5)

## 2015-12-02 NOTE — Addendum Note (Signed)
Addended by: Agnes Lawrence on: 12/02/2015 12:04 PM   Modules accepted: Orders

## 2015-12-02 NOTE — Progress Notes (Signed)
HPI:  Here for CPE:  -Concerns and/or follow up today:   Prediabetes/HLD: -on statin -not doing as well with healthy lifestyle the last few weeks -denies:polyuria, polydipsia, vision changes  Vit D insuffieciency: -advised oral Vit D -reports: not taking vit D -denies: muscle pain, fatigue, kidney stones  Hx splenectomy: Prevnar 13, reports done Pneumococcal 23 done 10/2014 menveo 2 doses- reports this was done meningococcal serogroup B (tremenba or Bexsero) :she wants to get today  -Taking folic acid, vitamin D or calcium: no  -Diabetes and Dyslipidemia Screening: FASTING for labs  -Hx of HTN: no  -Vaccines: UTD  -pap history: reports all prior paps normal; s/p complete hysterectomy for benign process but was still getting paps?  -FDLMP: n/a  -wants STI testing (Hep C if born [birth year]): no; agrees to one time hep c screening - no risk factors  -FH breast, colon or ovarian ca: see FH Last mammogram: agrees to schedule Last colon cancer screening: UTD per HM  -Alcohol, Tobacco, drug use: see social history  Review of Systems - no fevers, unintentional weight loss, vision loss, hearing loss, chest pain, sob, hemoptysis, melena, hematochezia, hematuria, genital discharge, changing or concerning skin lesions, bleeding, bruising, loc, thoughts of self harm or SI  Past Medical History  Diagnosis Date  . Lactose intolerance   . Allergy   . GERD (gastroesophageal reflux disease)     "does not need meds anymore"  . Hyperlipidemia   . Heart murmur     Past Surgical History  Procedure Laterality Date  . 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 Abdominal hysterectomy  2006    benign tumor, complete  . Knee arthroscopy Right     Family History  Problem Relation Age of Onset  . Arthritis Mother   . Hyperlipidemia Mother   . Stroke Mother   . Hypertension Mother   . Colon cancer Neg Hx     Social History   Social History  . Marital Status: Married     Spouse Name: N/A  . Number of Children: N/A  . Years of Education: N/A   Social History Main Topics  . Smoking status: Never Smoker   . Smokeless tobacco: Never Used  . Alcohol Use: No  . Drug Use: No  . Sexual Activity: Not Asked   Other Topics Concern  . 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  pravastatin (PRAVACHOL) 20 MG tablet, TAKE 1 TABLET (20 MG TOTAL) BY MOUTH DAILY., Disp: 30 tablet, Rfl: 1  EXAM:  Filed Vitals:   12/02/15 1117  BP: 118/82  Pulse: 68  Temp: 98.2 F (36.8 C)   Body mass index is 33.94 kg/(m^2).  GENERAL: vitals reviewed and listed below, alert, oriented, appears well hydrated and in no acute distress  HEENT: head atraumatic, PERRLA, normal appearance of eyes, ears, nose and mouth. moist mucus membranes.  NECK: supple, no masses or lymphadenopathy  LUNGS: clear to auscultation bilaterally, no rales, rhonchi or wheeze  CV: HRRR, no peripheral edema or cyanosis, normal pedal pulses  BREAST: refused  ABDOMEN: bowel sounds normal, soft, non tender  to palpation, no masses, no rebound or guarding  GU: normal appearance of external genitalia - no lesions or masses, normal vaginal mucosa - no abnormal discharge, pap of cuff  RECTAL: refused  SKIN: no rash or abnormal lesions  MS: normal gait, moves all extremities normally  NEURO: normal gait, speech and thought processing grossly intact, muscle tone grossly intact throughout  PSYCH: normal affect, pleasant and cooperative  ASSESSMENT AND PLAN:  Discussed the following assessment and plan:  Visit for preventive health examination - Plan: CBC (no diff), Basic metabolic panel, Hep C Antibody  Hyperlipemia - Plan: Lipid  Panel  H/O splenectomy  Hyperglycemia - Plan: Hemoglobin A1c  BMI 33.0-33.9,adult  Need for meningococcal vaccination - Plan: Meningococcal B, OMV (Bexsero)   -Discussed and advised all [place] preventive services health task force level A and B recommendations for age, sex and risks.  -Advised at least 150 minutes of exercise per week and a healthy diet with avoidance of (less then 1 serving per week) processed foods, white starches, red meat, fast foods and sweets and consisting of: * 5-9 servings of fresh fruits and vegetables (not corn or potatoes) *nuts and seeds, beans *olives and olive oil *lean meats such as fish and white chicken  *whole grains  If neg pap would advise no further pap given s/p complete hysterectomy  -labs: CBC, BMP, Lipid, hgba1c, studies and vaccines per orders this encounter  Orders Placed This Encounter  Procedures  . Meningococcal B, OMV (Bexsero)  . Lipid Panel  . Hemoglobin A1c  . CBC (no diff)  . Basic metabolic panel  . Hep C Antibody    Patient advised to return to clinic immediately if symptoms worsen or persist or new concerns.  Patient Instructions  BEFORE YOU LEAVE: -follow up: 6 months -labs -did she get or want to do the meningitis B vaccine due to her history of splenectomy?  Vit D3 [phone] IU daily  We have ordered labs or studies at this visit. It can take up to 1-2 weeks for results and processing. IF results require follow up or explanation, we will call you with instructions. Clinically stable results will be released to your [facility]. If you have not heard from [place] or cannot find your results in [facility] in 2 weeks please contact our office at [phone].  If you are not yet signed up for [facility], please consider signing up.   We recommend the following healthy lifestyle: 1) Small portions - eat off of salad plate instead of dinner plate 2) Eat a healthy clean diet with avoidance of (less then 1 serving per week) processed  foods, sweetened drinks, white starches, red meat, fast foods and sweets and consisting of: * 5-9 servings per day of fresh or frozen fruits and vegetables (not corn or potatoes, not dried or canned) *nuts and seeds, beans *olives and olive oil *small portions of lean meats such as fish and white chicken  *small portions of whole grains 3)Get at least 150 minutes of sweaty aerobic exercise per week 4)reduce stress - counseling, meditation, relaxation to balance other aspects of your life             No Follow-up on file.  Colin Benton R., DO

## 2015-12-02 NOTE — Patient Instructions (Addendum)
BEFORE YOU LEAVE: -follow up: 6 months -labs  Vit D3 [phone] IU daily  We have ordered labs or studies at this visit. It can take up to 1-2 weeks for results and processing. IF results require follow up or explanation, we will call you with instructions. Clinically stable results will be released to your [facility]. If you have not heard from [place] or cannot find your results in [facility] in 2 weeks please contact our office at [phone].  If you are not yet signed up for [facility], please consider signing up.   We recommend the following healthy lifestyle: 1) Small portions - eat off of salad plate instead of dinner plate 2) Eat a healthy clean diet with avoidance of (less then 1 serving per week) processed foods, sweetened drinks, white starches, red meat, fast foods and sweets and consisting of: * 5-9 servings per day of fresh or frozen fruits and vegetables (not corn or potatoes, not dried or canned) *nuts and seeds, beans *olives and olive oil *small portions of lean meats such as fish and white chicken  *small portions of whole grains 3)Get at least 150 minutes of sweaty aerobic exercise per week 4)reduce stress - counseling, meditation, relaxation to balance other aspects of your life

## 2015-12-02 NOTE — Progress Notes (Signed)
Pre visit review using our clinic review tool, if applicable. No additional management support is needed unless otherwise documented below in the visit note. 

## 2015-12-03 LAB — HEPATITIS C ANTIBODY: HCV Ab: NEGATIVE

## 2015-12-03 LAB — CYTOLOGY - PAP

## 2015-12-06 ENCOUNTER — Telehealth: Payer: Self-pay | Admitting: *Deleted

## 2015-12-06 MED ORDER — TRIAMCINOLONE ACETONIDE 0.1 % EX CREA: 1.0000 "application " | TOPICAL_CREAM | Freq: Two times a day (BID) | CUTANEOUS | 1 refills | Status: DC

## 2015-12-06 NOTE — Telephone Encounter (Signed)
I called the pt and discussed the test results with her.  She requested a refill on Triamcinolone cream for eczema as she forgot to ask for this from Dr Maudie Mercury at her physical.

## 2015-12-06 NOTE — Addendum Note (Signed)
Addended by: Agnes Lawrence on: 12/06/2015 04:10 PM   Modules accepted: Orders

## 2015-12-06 NOTE — Addendum Note (Signed)
Addended by: Agnes Lawrence on: 12/06/2015 03:56 PM   Modules accepted: Orders

## 2016-02-22 MED ORDER — PRAVASTATIN SODIUM 40 MG PO TABS: 40.0000 mg | ORAL_TABLET | Freq: Every day | ORAL | 3 refills | Status: DC

## 2016-02-22 NOTE — Addendum Note (Signed)
Addended by: Agnes Lawrence on: 02/22/2016 07:59 AM   Modules accepted: Orders

## 2016-02-24 ENCOUNTER — Other Ambulatory Visit: Payer: Self-pay | Admitting: Family Medicine

## 2016-02-24 DIAGNOSIS — Z1231 Encounter for screening mammogram for malignant neoplasm of breast: Secondary | ICD-10-CM

## 2016-03-08 ENCOUNTER — Ambulatory Visit
Admission: RE | Admit: 2016-03-08 | Discharge: 2016-03-08 | Disposition: A | Discharge status: Home / Self Care | Payer: Managed Care, Other (non HMO) | Source: Ambulatory Visit | Attending: Family Medicine | Admitting: Family Medicine

## 2016-03-08 DIAGNOSIS — Z1231 Encounter for screening mammogram for malignant neoplasm of breast: Secondary | ICD-10-CM

## 2016-03-10 ENCOUNTER — Other Ambulatory Visit: Payer: Self-pay | Admitting: Family Medicine

## 2016-03-10 DIAGNOSIS — R928 Other abnormal and inconclusive findings on diagnostic imaging of breast: Secondary | ICD-10-CM

## 2016-03-15 ENCOUNTER — Ambulatory Visit
Admission: RE | Admit: 2016-03-15 | Discharge: 2016-03-15 | Disposition: A | Discharge status: Home / Self Care | Payer: Managed Care, Other (non HMO) | Source: Ambulatory Visit | Attending: Family Medicine | Admitting: Family Medicine

## 2016-03-15 DIAGNOSIS — R928 Other abnormal and inconclusive findings on diagnostic imaging of breast: Secondary | ICD-10-CM

## 2016-03-29 NOTE — Progress Notes (Signed)
HPI:  Here for follow up on prediabetes, HLD, obesity, Vit D insuffieciency. On statin - increased last visit, tolerating well. Increased exercise to 3 days per week. Has health coach. Eating better. Vit D supplementation advised. Hx splenectomy. Some issues with stress and sleep - unemployed status is stressful for her. Wake up at 3 am, then difficulty sleeping after that. Denies CP, SOb, DOE, HA, depression, panic disorder. Summary of medical hx below.  Prediabetes/HLD: -on statin -denies:polyuria, polydipsia, vision changes  Vit D insuffieciency: -advised oral Vit D  Hx splenectomy: Prevnar 13, reports done Pneumococcal 23 done 10/2014 menveo 2 doses- reports this was done meningococcal serogroup B (tremenba or Bexsero) : 11/2015  ROS: See pertinent positives and negatives per HPI.  Past Medical History:  Diagnosis Date  . Allergy   . GERD (gastroesophageal reflux disease)    "does not need meds anymore"  . Heart murmur   . Hyperlipidemia   . Lactose intolerance     Past Surgical History:  Procedure Laterality Date  . ABDOMINAL HYSTERECTOMY  2006   benign tumor, complete  . KNEE ARTHROSCOPY Right   . 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 Arthritis Mother   . Hyperlipidemia Mother   . Stroke Mother   . Hypertension Mother   . Colon cancer Neg Hx     Social History   Social History  . Marital status: Married    Spouse name: N/A  . Number of children: N/A  . Years of education: N/A   Social History Main Topics  . Smoking status: Never Smoker  . Smokeless tobacco: Never Used  . Alcohol use No  . Drug use: No  . Sexual activity: Not Asked   Other Topics Concern  . None   Social History Narrative   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: lives 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, husband and mother      Spiritual Beliefs: Christian      Lifestyle: walks a little a few days per  week, diet is ok              Current Outpatient Prescriptions:  .  pravastatin (PRAVACHOL) 40 MG tablet, Take 1 tablet (40 mg total) by mouth daily., Disp: 90 tablet, Rfl: 3 .  triamcinolone cream (KENALOG) 0.1 %, Apply 1 application topically 2 (two) times daily., Disp: 30 g, Rfl: 1  EXAM:  Vitals:   03/30/16 0842  BP: 128/72  Pulse: 62  Temp: 97.9 F (36.6 C)    Body mass index is 33.64 kg/m.  GENERAL: vitals reviewed and listed above, alert, oriented, appears well hydrated and in no acute distress  HEENT: atraumatic, conjunttiva clear, no obvious abnormalities on inspection of external nose and ears  NECK: no obvious masses on inspection  LUNGS: clear to auscultation bilaterally, no wheezes, rales or rhonchi, good air movement  CV: HRRR, no peripheral edema  MS: moves all extremities without noticeable abnormality  PSYCH: pleasant and cooperative, no obvious depression or anxiety  ASSESSMENT AND PLAN:  Discussed the following assessment and plan:  Hyperlipidemia, unspecified hyperlipidemia type - Plan: Lipid Panel  Hyperglycemia  BMI 33.0-33.9,adult  Insomnia, unspecified type  -see pt instruction for plan - lots of counseling on sleep hygeine and management sleep and stress - info provided for scheduling CBT -recheck lipids -lifestyle encouragement and recs -Patient advised to return or notify a doctor  immediately if symptoms worsen or persist or new concerns arise.  Patient Instructions  BEFORE YOU LEAVE: -labs -follow up: 4-6 months  We have ordered labs or studies at this visit. It can take up to 1-2 weeks for results and processing. IF results require follow up or explanation, we will call you with instructions. Clinically stable results will be released to your [facility]. If you have not heard from [place] or cannot find your results in [facility] in 2 weeks please contact our office at [phone].  If you are not yet signed up for [facility], please  consider signing up.  If you are not yet signed up for [facility], please SIGN UP TODAY. We now offer online scheduling, same day appointments and extended hours. WHEN YOU DON'T FEEL YOUR BEST.Marland KitchenMarland KitchenWE ARE HERE TO HELP.  FOR IMPROVED SLEEP AND TO RESET YOUR SLEEP SCHEDULE: []  exercise 30 minutes daily  []  go to bed and wake up at the same time  []  keep bedroom cool, dark and quiet  []  reserve bed for sleep - do not read, watch TV, etc in bed  []  If you toss and turn more then 15-20 minutes get out of bed and list thoughts/do quite activity then go back to bed; repeat as needed; do not worry about when you eventually fall asleep - still get up at the same time and turn on lights and take shower  [] get counseling  []  some people find that a half dose of benadryl, melatonin, tylenol pm or unisom on a few nights per week is helpful initially for a few weeks  [] seek help and treat any depression or anxiety  [] prescription strength sleep medications should only be used in severe cases of insomnia if other measures fail and should be used sparingly    We recommend the following healthy lifestyle for LIFE: 1) Small portions.   Tip: eat off of a salad plate instead of a dinner plate.  Tip: It is ok to feel hungry after a meal - that likely means you ate an appropriate portion.  Tip: if you need more or a snack choose fruits, veggies and/or a handful of nuts or seeds.  2) Eat a healthy clean diet.  * Tip: Avoid (less then 1 serving per week): processed foods, sweets, sweetened drinks, white starches (rice, flour, bread, potatoes, pasta, etc), red meat, fast foods, butter  *Tip: CHOOSE instead   * 5-9 servings per day of fresh or frozen fruits and vegetables (but not corn, potatoes, bananas, canned or dried fruit)   *nuts and seeds, beans   *olives and olive oil   *small portions of lean meats such as fish and white chicken    *small portions of whole grains  3)Get at least 150 minutes of  sweaty aerobic exercise per week.  4)Reduce stress - consider counseling, meditation and relaxation to balance other aspects of your life.          Colin Benton R., DO

## 2016-03-30 ENCOUNTER — Ambulatory Visit (INDEPENDENT_AMBULATORY_CARE_PROVIDER_SITE_OTHER): Payer: Managed Care, Other (non HMO) | Admitting: Family Medicine

## 2016-03-30 ENCOUNTER — Encounter: Payer: Self-pay | Admitting: Family Medicine

## 2016-03-30 VITALS — BP 128/72 | HR 62 | Temp 97.9°F | Ht 68.25 in | Wt 222.9 lb

## 2016-03-30 DIAGNOSIS — R739 Hyperglycemia, unspecified: Secondary | ICD-10-CM | POA: Diagnosis not present

## 2016-03-30 DIAGNOSIS — G4700 Insomnia, unspecified: Secondary | ICD-10-CM

## 2016-03-30 DIAGNOSIS — E785 Hyperlipidemia, unspecified: Secondary | ICD-10-CM | POA: Diagnosis not present

## 2016-03-30 DIAGNOSIS — Z6833 Body mass index (BMI) 33.0-33.9, adult: Secondary | ICD-10-CM

## 2016-03-30 LAB — LIPID PANEL
Cholesterol: 199 mg/dL (ref 0–200)
HDL: 39.6 mg/dL (ref >39.00)
LDL Cholesterol: 137 mg/dL — ABNORMAL HIGH (ref 0–99)
NonHDL: 159.57
Total CHOL/HDL Ratio: 5
Triglycerides: 113 mg/dL (ref 0.0–149.0)
VLDL: 22.6 mg/dL (ref 0.0–40.0)

## 2016-03-30 NOTE — Progress Notes (Signed)
Pre visit review using our clinic review tool, if applicable. No additional management support is needed unless otherwise documented below in the visit note. 

## 2016-03-30 NOTE — Patient Instructions (Signed)
BEFORE YOU LEAVE: -labs -follow up: 4-6 months  We have ordered labs or studies at this visit. It can take up to 1-2 weeks for results and processing. IF results require follow up or explanation, we will call you with instructions. Clinically stable results will be released to your [facility]. If you have not heard from [place] or cannot find your results in [facility] in 2 weeks please contact our office at [phone].  If you are not yet signed up for [facility], please consider signing up.  If you are not yet signed up for [facility], please SIGN UP TODAY. We now offer online scheduling, same day appointments and extended hours. WHEN YOU DON'T FEEL YOUR BEST.Marland KitchenMarland KitchenWE ARE HERE TO HELP.  FOR IMPROVED SLEEP AND TO RESET YOUR SLEEP SCHEDULE: []  exercise 30 minutes daily  []  go to bed and wake up at the same time  []  keep bedroom cool, dark and quiet  []  reserve bed for sleep - do not read, watch TV, etc in bed  []  If you toss and turn more then 15-20 minutes get out of bed and list thoughts/do quite activity then go back to bed; repeat as needed; do not worry about when you eventually fall asleep - still get up at the same time and turn on lights and take shower  [] get counseling  []  some people find that a half dose of benadryl, melatonin, tylenol pm or unisom on a few nights per week is helpful initially for a few weeks  [] seek help and treat any depression or anxiety  [] prescription strength sleep medications should only be used in severe cases of insomnia if other measures fail and should be used sparingly    We recommend the following healthy lifestyle for LIFE: 1) Small portions.   Tip: eat off of a salad plate instead of a dinner plate.  Tip: It is ok to feel hungry after a meal - that likely means you ate an appropriate portion.  Tip: if you need more or a snack choose fruits, veggies and/or a handful of nuts or seeds.  2) Eat a healthy clean diet.  * Tip: Avoid (less then 1 serving per  week): processed foods, sweets, sweetened drinks, white starches (rice, flour, bread, potatoes, pasta, etc), red meat, fast foods, butter  *Tip: CHOOSE instead   * 5-9 servings per day of fresh or frozen fruits and vegetables (but not corn, potatoes, bananas, canned or dried fruit)   *nuts and seeds, beans   *olives and olive oil   *small portions of lean meats such as fish and white chicken    *small portions of whole grains  3)Get at least 150 minutes of sweaty aerobic exercise per week.  4)Reduce stress - consider counseling, meditation and relaxation to balance other aspects of your life.

## 2016-05-17 ENCOUNTER — Encounter: Payer: Self-pay | Admitting: Adult Health

## 2016-05-17 ENCOUNTER — Ambulatory Visit (INDEPENDENT_AMBULATORY_CARE_PROVIDER_SITE_OTHER): Payer: Managed Care, Other (non HMO) | Admitting: Adult Health

## 2016-05-17 VITALS — BP 124/84 | Temp 98.9°F | Ht 68.25 in | Wt 222.0 lb

## 2016-05-17 DIAGNOSIS — M25562 Pain in left knee: Secondary | ICD-10-CM

## 2016-05-17 NOTE — Progress Notes (Signed)
Subjective:    Patient ID: Deanna [name], female    DOB: 1960/07/12, 56 y.o.   MRN: [id number]  HPI  56 year old female who presents to the office today for the acute complaint of right knee pain. She reports that the pain started about 2 weeks ago after having to climb a series of stairs. The pain continues today and "feels like it is coming from inside the knee." Pain with worse with walking and climbing. She denies any swelling, redness, warmth or trauma to the area.   She has had left knee surgery in the past. Also had cortisone shots in the left knee which did not work.   She has been using Aleve which helps "sometimes.   Review of Systems Negative unless mentioned above.   Past Medical History:  Diagnosis Date  . Allergy   . GERD (gastroesophageal reflux disease)    "does not need meds anymore"  . Heart murmur   . Hyperlipidemia   . Lactose intolerance     Social History   Social History  . Marital status: Married    Spouse name: N/A  . Number of children: N/A  . Years of education: N/A   Occupational History  . Not on file.   Social History Main Topics  . Smoking status: Never Smoker  . Smokeless tobacco: Never Used  . Alcohol use No  . Drug use: No  . Sexual activity: Not on file   Other Topics Concern  . 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: lives 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, husband and mother      Spiritual Beliefs: Christian      Lifestyle: walks a little a few days per week, diet is ok             Past Surgical History:  Procedure Laterality Date  . ABDOMINAL HYSTERECTOMY  2006   benign tumor, complete  . KNEE ARTHROSCOPY Right   . 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 Arthritis Mother   . Hyperlipidemia Mother   . Stroke Mother   . Hypertension Mother   . Colon cancer Neg Hx     Allergies  Allergen  Reactions  . Eggs Or Egg-Derived Products Nausea And Vomiting  . Lactose Intolerance (Gi) Other (See Comments)    vomiting    Current Outpatient Prescriptions on File Prior to Visit  Medication Sig Dispense Refill  . pravastatin (PRAVACHOL) 40 MG tablet Take 1 tablet (40 mg total) by mouth daily. 90 tablet 3  . triamcinolone cream (KENALOG) 0.1 % Apply 1 application topically 2 (two) times daily. 30 g 1   No current facility-administered medications on file prior to visit.     BP 124/84   Temp 98.9 F (37.2 C) (Oral)   Ht 5' 8.25" (1.734 m)   Wt 222 lb (100.7 kg)   BMI 33.51 kg/m       Objective:   Physical Exam  Constitutional: She is oriented to person, place, and time. She appears well-developed and well-nourished. No distress.  Musculoskeletal: Normal range of motion. She exhibits no edema, tenderness or deformity.       Right knee: She exhibits normal range of motion, no swelling, no effusion, no deformity, no erythema, normal alignment, no LCL laxity and no bony tenderness. No tenderness found.  No pain with straight leg raise, external or internal rotation.   Neurological: She is alert and oriented to person, place, and time. She has normal reflexes.  Skin: Skin is warm and dry. No rash noted. She is not diaphoretic. No erythema. No pallor.  Psychiatric: She has a normal mood and affect. Her behavior is normal. Judgment and thought content normal.  Nursing note and vitals reviewed.     Assessment & Plan:  1. Acute pain of left knee - Will do conservative measures first.  - Motrin 600mg  Q8H x 3 days  - Rest, elevation and ice - Can also use knee brace - Follow up with PCP if no improvement - Consider PT or ortho consult   Dorothyann Peng, NP

## 2016-07-18 ENCOUNTER — Ambulatory Visit (INDEPENDENT_AMBULATORY_CARE_PROVIDER_SITE_OTHER): Payer: Managed Care, Other (non HMO) | Admitting: Family Medicine

## 2016-07-18 ENCOUNTER — Encounter: Payer: Self-pay | Admitting: Family Medicine

## 2016-07-18 ENCOUNTER — Telehealth: Payer: Self-pay | Admitting: *Deleted

## 2016-07-18 ENCOUNTER — Ambulatory Visit
Admission: RE | Admit: 2016-07-18 | Discharge: 2016-07-18 | Disposition: A | Discharge status: Home / Self Care | Payer: Managed Care, Other (non HMO) | Source: Ambulatory Visit | Attending: Family Medicine | Admitting: Family Medicine

## 2016-07-18 VITALS — BP 120/82 | HR 77 | Temp 98.3°F | Ht 68.25 in | Wt 223.2 lb

## 2016-07-18 DIAGNOSIS — R252 Cramp and spasm: Secondary | ICD-10-CM

## 2016-07-18 DIAGNOSIS — R200 Anesthesia of skin: Secondary | ICD-10-CM | POA: Diagnosis not present

## 2016-07-18 DIAGNOSIS — Z634 Disappearance and death of family member: Secondary | ICD-10-CM | POA: Diagnosis not present

## 2016-07-18 DIAGNOSIS — R519 Headache, unspecified: Secondary | ICD-10-CM

## 2016-07-18 DIAGNOSIS — R42 Dizziness and giddiness: Secondary | ICD-10-CM | POA: Diagnosis not present

## 2016-07-18 DIAGNOSIS — R51 Headache: Principal | ICD-10-CM

## 2016-07-18 DIAGNOSIS — R899 Unspecified abnormal finding in specimens from other organs, systems and tissues: Secondary | ICD-10-CM | POA: Diagnosis not present

## 2016-07-18 DIAGNOSIS — F439 Reaction to severe stress, unspecified: Secondary | ICD-10-CM

## 2016-07-18 DIAGNOSIS — F419 Anxiety disorder, unspecified: Secondary | ICD-10-CM

## 2016-07-18 LAB — BASIC METABOLIC PANEL
BUN: 10 mg/dL (ref 6–23)
CO2: 30 mEq/L (ref 19–32)
Calcium: 9.7 mg/dL (ref 8.4–10.5)
Chloride: 104 mEq/L (ref 96–112)
Creatinine, Ser: 0.87 mg/dL (ref 0.40–1.20)
GFR: 87.43 mL/min (ref >60.00)
Glucose, Bld: 88 mg/dL (ref 70–99)
Potassium: 4.3 mEq/L (ref 3.5–5.1)
Sodium: 140 mEq/L (ref 135–145)

## 2016-07-18 LAB — HEMOGLOBIN A1C: Hgb A1c MFr Bld: 6 % (ref 4.6–6.5)

## 2016-07-18 LAB — TSH: TSH: 1.33 u[IU]/mL (ref 0.35–4.50)

## 2016-07-18 LAB — CBC
HCT: 37.2 % (ref 36.0–46.0)
Hemoglobin: 12.1 g/dL (ref 12.0–15.0)
MCHC: 32.6 g/dL (ref 30.0–36.0)
MCV: 86.9 fl (ref 78.0–100.0)
Platelets: 454 10*3/uL — ABNORMAL HIGH (ref 150.0–400.0)
RBC: 4.28 Mil/uL (ref 3.87–5.11)
RDW: 14.1 % (ref 11.5–15.5)
WBC: 7.2 10*3/uL (ref 4.0–10.5)

## 2016-07-18 MED ORDER — GADOBENATE DIMEGLUMINE 529 MG/ML IV SOLN
20.0000 mL | Freq: Once | INTRAVENOUS | Status: AC | PRN
  Administered 2016-07-18: 20 mL via INTRAVENOUS

## 2016-07-18 MED ORDER — MECLIZINE HCL 25 MG PO TABS: 25.0000 mg | ORAL_TABLET | Freq: Three times a day (TID) | ORAL | 0 refills | Status: DC | PRN

## 2016-07-18 NOTE — Patient Instructions (Addendum)
BEFORE YOU LEAVE: -ordered stat MRI -labs -follow up: 1 month  Waiting on MRI and lab results.  Meclizine if needed for vertigo.  Do not drive with vertigo.  Tonic water before bed for leg cramps  I hope you are feeling better soon! Seek care immediately if worsening, new concerns or you are not improving with treatment.

## 2016-07-18 NOTE — Telephone Encounter (Signed)
Dawn called from [facility] stating Dr Delice Lesch will work the pt in on Thursday 3/8 at 8:45am.  Per Dr Maudie Mercury the pt stated she contacted her pharmacy and could not get the Rx for Pravastatin and I called the [facility] on [address] and spoke with the pharmacist Claiborne Billings and she stated the Rx for Pravastatin does have refills available and the pt has not picked up a Rx from their pharmacy since 02/2016.  I called the pt and informed her of the appt and in regards to the Rx and she agreed.

## 2016-07-18 NOTE — Progress Notes (Addendum)
HPI:  Acute visit for numerous issues. She is mainly concerned about some vertigo that she has had for the last 2 weeks. She has intermittent brief spells of vertigo triggered by specific movements. She has had mild nausea with these episodes. She also had a headache for 3 days last week. This was mild and she had a lot of sinus congestion and drainage at the time. Her sinus issues are improving. About 1 week ago she had a very brief spell of numbness in the left hand. She reports that she can move her hand, but that she cannot feel it for about 30 seconds. She then felt some tingling in the hand for about a minute and then the symptoms completely resolved. She did not have any other symptoms at that time. She has had some leg cramps on and off for about a month. These woke her at night and family a charley horse. She has been eating extra salt see if this will help. She has been under a lot of stress and has had a lot of anxiety recently. This is related to 3 deaths in the family, all 3 of these people are pretty close to her. She does feel she is doing better with some bereavement counseling. No depression, thoughts of self-harm or feeling out of control. Denies any weakness, paresthesias other than that mentioned above, vision changes, speech changes, cognitive changes, fevers, neck cyst stiffness, persistent headaches or severe headaches.  ROS: See pertinent positives and negatives per HPI.  Past Medical History:  Diagnosis Date  . Allergy   . GERD (gastroesophageal reflux disease)    "does not need meds anymore"  . Heart murmur   . Hyperlipidemia   . Lactose intolerance     Past Surgical History:  Procedure Laterality Date  . ABDOMINAL HYSTERECTOMY  2006   benign tumor, complete  . KNEE ARTHROSCOPY Right   . 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 Arthritis Mother   . Hyperlipidemia Mother   . Stroke Mother   .  Hypertension Mother   . Colon cancer Neg Hx     Social History   Social History  . Marital status: Married    Spouse name: N/A  . Number of children: N/A  . Years of education: N/A   Social History Main Topics  . Smoking status: Never Smoker  . Smokeless tobacco: Never Used  . Alcohol use No  . Drug use: No  . Sexual activity: Not Asked   Other Topics Concern  . 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  pravastatin (PRAVACHOL) 40 MG tablet, Take 1 tablet (40 mg total) by mouth daily., Disp: 90 tablet, Rfl: 3 .  triamcinolone cream (KENALOG) 0.1 %, Apply 1 application topically 2 (two) times daily., Disp: 30 g, Rfl: 1 .  meclizine (ANTIVERT) 25 MG tablet, Take 1 tablet (25 mg total) by mouth 3 (three) times daily as needed for dizziness., Disp: 30 tablet, Rfl: 0  EXAM:  Vitals:   07/18/16 1111  BP: 120/82  Pulse:  77  Temp: 98.3 F (36.8 C)    Body mass index is 33.69 kg/m.  GENERAL: vitals reviewed and listed above, alert, oriented, appears well hydrated and in no acute distress  HEENT: atraumatic, conjunttiva clear, no obvious abnormalities on inspection of external nose and ears, normal appearance of ear canals and TMs, clear nasal congestion, mild post oropharyngeal erythema with PND, no tonsillar edema or exudate, no sinus TTP  NECK: no obvious masses on inspection, no bruits, normal range of motion of the head and neck, no meningeal signs  LUNGS: clear to auscultation bilaterally, no wheezes, rales or rhonchi, good air movement  CV: HRRR, no peripheral edema  MS: moves all extremities without noticeable abnormality  PSYCH/NEURO: pleasant and cooperative, no obvious depression or anxiety, cranial  nerves II through XII grossly intact, finger to nose is normal, normal strength/sensitivity to light touch/DTRs in the upper extremities bilaterally, normal gait, speech and thought processing are grossly intact, vision is grossly intact, hearing is grossly intact, Deanna [name] is positive to the right results in counterclockwise rotational nystagmus and reproduction of her vertigo symptoms  ASSESSMENT AND PLAN:  Discussed the following assessment and plan: More than 50% of over 40 minutes spent in total in caring for this patient was spent face-to-face with the patient, counseling and/or coordinating care.    Acute nonintractable headache, unspecified headache type - Plan: MR Brain W Wo Contrast  Numbness of left hand - Plan: TSH, Hemoglobin A1c, MR Brain W Wo Contrast  Vertigo - Plan: MR Brain W Wo Contrast  Anxiety  Stress  Bereavement  Leg cramps - Plan: Basic metabolic panel, CBC  -she has many symptoms -we discussed possible serious and likely etiologies, workup and treatment, treatment risks and return precautions -after this discussion, Deanna [name] opted for Labs, stat MRI, meclizine, continued counseling and if MRI is okay vestibular rehabilitation and possible neurology evaluation - we will try some tonic water for the leg cramps if the labs all looked good and -follow up advised in 1 month -of course, we advised Deanna [name]  to return or notify a doctor immediately if symptoms recur,worsen or persist or new concerns arise.  Patient Instructions  BEFORE YOU LEAVE: -ordered stat MRI -labs -follow up: 1 month  Waiting on MRI and lab results.  Meclizine if needed for vertigo.  Do not drive with vertigo.  Tonic water before bed for leg cramps  I hope you are feeling better soon! Seek care immediately if worsening, new concerns or you are not improving with treatment.      Colin Benton R., DO

## 2016-07-18 NOTE — Progress Notes (Signed)
Pre visit review using our clinic review tool, if applicable. No additional management support is needed unless otherwise documented below in the visit note. 

## 2016-07-18 NOTE — Telephone Encounter (Signed)
Deanna [name] called from [place] with a call report stating the MRI of the brain's impression reads as follows: Punctate focus of reduced diffusion right frontal lobe which can be seen with acute ischemia OR hyperacute demylination- additional sub-centimeter white matter with cortical lesions in a pattern associated with chronic demylination.  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 Deanna [name] stated she will also fax the report to Dr Maudie Mercury.

## 2016-07-19 NOTE — Telephone Encounter (Signed)
Addressed.

## 2016-07-20 ENCOUNTER — Encounter: Payer: Self-pay | Admitting: Neurology

## 2016-07-20 ENCOUNTER — Ambulatory Visit (INDEPENDENT_AMBULATORY_CARE_PROVIDER_SITE_OTHER): Payer: Managed Care, Other (non HMO) | Admitting: Neurology

## 2016-07-20 ENCOUNTER — Telehealth: Payer: Self-pay | Admitting: Neurology

## 2016-07-20 VITALS — BP 142/98 | HR 68 | Ht 69.0 in | Wt 224.0 lb

## 2016-07-20 DIAGNOSIS — R202 Paresthesia of skin: Secondary | ICD-10-CM | POA: Diagnosis not present

## 2016-07-20 DIAGNOSIS — R9089 Other abnormal findings on diagnostic imaging of central nervous system: Secondary | ICD-10-CM | POA: Diagnosis not present

## 2016-07-20 DIAGNOSIS — R200 Anesthesia of skin: Secondary | ICD-10-CM

## 2016-07-20 MED ORDER — CLOPIDOGREL BISULFATE 75 MG PO TABS: 75.0000 mg | ORAL_TABLET | Freq: Every day | ORAL | 11 refills | Status: DC

## 2016-07-20 NOTE — Patient Instructions (Addendum)
1. Schedule MRI cervical and thoracic spine with and without contrast. We have sent a referral to [facility] for your MRI and they will call you directly to schedule your appt. They are located at [place]. If you need to contact them directly please call [phone]. 2. Schedule echocardiogram with bubble study. We have you scheduled for your Echo on 07/28/16 at 2:00 pm. Please arrive 15 minutes prior and go to [facility] main entrance. If this is not a good date/time please call [phone] to reschedule.  3. Schedule carotid dopplers. We have you scheduled for your Carotid Doppler on 07/28/16 at 1:00 pm. Please arrive 15 minutes prior and go to [facility] main entrance. If this is not a good date/time please call [phone] to reschedule.  4. Start Plavix 75mg  daily 5. If symptoms change, go to ER immediately 6. Follow-up after tests, call for any changes

## 2016-07-20 NOTE — Progress Notes (Signed)
NEUROLOGY CONSULTATION NOTE  Deanna [name] MRN: [id number] DOB: 03-May-1961  Referring provider: Dr. Colin Benton Primary care provider: Dr. Colin Benton  Reason for consult:  Vertigo, numbness, abnormal MRI brain  Dear Dr Maudie Mercury:  Thank you for your kind referral of Deanna [name] for consultation of the above symptoms. Although her history is well known to you, please allow me to reiterate it for the purpose of our medical record. The patient was accompanied to the clinic by her husband who also provides collateral information. Records and images were personally reviewed where available.  HISTORY OF PRESENT ILLNESS: This is a pleasant 56 year old right-handed woman with a history of hyperlipidemia presenting with the above symptoms. She reports a 3-year history of intermittent episodes of dizziness described as spinning, as well as feeling lightheaded with blurred vision lasting 20-30 seconds. Recently she started having different symptoms, around 2 weeks ago she started getting carsick, which is new for her. A week ago, her left hand was numb, "like it was not there," then 20-30 seconds later it started tingling. Face and leg were unaffected. She did not have any neck pain at that time, but 3 days later started having a headache with pressure over the frontal regions that she ascribed to her sinuses. She started having pretty significant intermittent nausea. She continued to have dizziness, last episode was a week ago. The headaches have resolved. Blurred vision still comes and goes. She denies any diplopia, dysarthria/dysphagia, back pain, bowel/bladder dysfunction, negative Lhermitte sign. She denies any prior history of focal symptoms, no prior history of vision loss. She denies any head injuries. 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 There is a family history of stroke.  She had an MRI brain with and without contrast last 07/18/2016 which I  personally reviewed, there was a faint punctate focus of reduced diffusion in the right frontal lobe deep white matter, difficult to see on the ADC map due to small size, this can be seen with acute ischemia or hyperacute demyelination. There were at least 5 additional subcentimeter white matter, right parietal, medial left thalamus, and left occipital horn FLAIR hyperintensities. There was mild prominence of the ventricles and sulci for age. No abnormal enhancement seen. 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   Laboratory Data: Lab Results  Component Value Date   WBC 7.2 07/18/2016   HGB 12.1 07/18/2016   HCT 37.2 07/18/2016   MCV 86.9 07/18/2016   PLT 454.0 (H) 07/18/2016     Chemistry      Component Value Date/Time   NA 140 07/18/2016 1153   K 4.3 07/18/2016 1153   CL 104 07/18/2016 1153   CO2 30 07/18/2016 1153   BUN 10 07/18/2016 1153   CREATININE 0.87 07/18/2016 1153      Component Value Date/Time   CALCIUM 9.7 07/18/2016 1153     Lab Results  Component Value Date   TSH 1.33 07/18/2016   Lab Results  Component Value Date   CHOL 199 03/30/2016   HDL 39.60 03/30/2016   LDLCALC 137 (H) 03/30/2016   LDLDIRECT 150.4 10/04/2012   TRIG 113.0 03/30/2016   CHOLHDL 5 03/30/2016    PAST MEDICAL HISTORY: Past Medical History:  Diagnosis Date  . Allergy   . GERD (gastroesophageal reflux disease)    "does not need meds anymore"  . Heart murmur   . Hyperlipidemia   . Lactose intolerance  PAST SURGICAL HISTORY: Past Surgical History:  Procedure Laterality Date  . ABDOMINAL HYSTERECTOMY  2006   benign tumor, complete  . KNEE ARTHROSCOPY Right   . NASAL SEPTUM SURGERY    . 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 meclizine (ANTIVERT) 25 MG tablet Take 1 tablet (25 mg total) by mouth 3 (three) times daily as needed for  dizziness. 30 tablet 0  . pravastatin (PRAVACHOL) 40 MG tablet Take 1 tablet (40 mg total) by mouth daily. 90 tablet 3  . triamcinolone cream (KENALOG) 0.1 % Apply 1 application topically 2 (two) times daily. 30 g 1   No current facility-administered medications on file prior to visit.     ALLERGIES: Allergies  Allergen Reactions  . Eggs Or Egg-Derived Products Nausea And Vomiting  . Lactose Intolerance (Gi) Other (See Comments)    vomiting    FAMILY HISTORY: Family History  Problem Relation Age of Onset  . Arthritis Mother   . Hyperlipidemia Mother   . Stroke Mother   . Hypertension Mother   . Colon cancer Neg Hx     SOCIAL HISTORY: Social History   Social History  . Marital status: Married    Spouse name: N/A  . Number of children: N/A  . Years of education: N/A   Occupational History  . Not on file.   Social History Main Topics  . Smoking status: Never Smoker  . Smokeless tobacco: Never Used  . Alcohol use No  . Drug use: No  . Sexual activity: Not on file   Other Topics Concern  . 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: lives 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, husband and mother      Spiritual Beliefs: Christian      Lifestyle: walks a little a few days per week, diet is ok             REVIEW OF SYSTEMS: Constitutional: No fevers, chills, or sweats, no generalized fatigue, change in appetite Eyes: No visual changes, double vision, eye pain Ear, nose and throat: No hearing loss, ear pain, nasal congestion, sore throat Cardiovascular: No chest pain, palpitations Respiratory:  No shortness of breath at rest or with exertion, wheezes GastrointestinaI: No nausea, vomiting, diarrhea, abdominal pain, fecal incontinence Genitourinary:  No dysuria, urinary retention or frequency Musculoskeletal:  + neck pain,no back pain Integumentary: No rash, pruritus, skin lesions Neurological: as  above Psychiatric: No depression, insomnia, anxiety Endocrine: No palpitations, fatigue, diaphoresis, mood swings, change in appetite, change in weight, increased thirst Hematologic/Lymphatic:  No anemia, purpura, petechiae. Allergic/Immunologic: no itchy/runny eyes, nasal congestion, recent allergic reactions, rashes  PHYSICAL EXAM: Vitals:   07/20/16 0859  BP: (!) 142/98  Pulse: 68   General: No acute distress Head:  Normocephalic/atraumatic Eyes: Fundoscopic exam shows bilateral sharp discs, no vessel changes, exudates, or hemorrhages Neck: supple, no paraspinal tenderness, full range of motion. Negative Lhermitte sign Back: No paraspinal tenderness Heart: regular rate and rhythm Lungs: Clear to auscultation bilaterally. Vascular: No carotid bruits. Skin/Extremities: No rash, no edema Neurological Exam: Mental status: alert and oriented to person, place, and time, no dysarthria or aphasia, Fund of knowledge is appropriate.  Recent and remote memory are intact.  Attention and concentration are normal.    Able to name objects and repeat phrases. Cranial  nerves: CN I: not tested CN II: pupils equal, round and reactive to light, visual fields intact, fundi unremarkable. CN III, IV, VI:  full range of motion, no nystagmus, no ptosis CN V: facial sensation intact CN VII: upper and lower face symmetric CN VIII: hearing intact to finger rub CN IX, X: gag intact, uvula midline CN XI: sternocleidomastoid and trapezius muscles intact CN XII: tongue midline Bulk & Tone: normal, no fasciculations. Motor: 5/5 throughout with no pronator drift. Sensation: intact to light touch, cold, pin, vibration and joint position sense.  No extinction to double simultaneous stimulation.  Romberg test negative Deep Tendon Reflexes: +2 throughout, no ankle clonus Plantar responses: downgoing bilaterally Cerebellar: no incoordination on finger to nose, heel to shin. No dysdiadochokinesia Gait:  narrow-based and steady, able to tandem walk adequately. Tremor: none  IMPRESSION: This is a 56 year old right-handed woman with a history of hyperlipidemia, with a 3-year history of intermittent dizziness suggestive of vertigo, presenting with new onset symptoms of worsening dizziness with nausea, headaches, and a transient episode of left hand numbness and tingling. All the symptoms have resolved, she is back to baseline, neurological exam today normal. Her MRI brain was concerning for a faint punctate focus of reduced diffusion in the right frontal lobe deep white matter, difficult to see on the ADC map due to small size, this can be seen with acute ischemia or hyperacute demyelination. This seems to correlate with her left hand symptoms from a week ago. There were at least 5 additional subcentimeter white matter, right parietal, medial left thalamus, and left occipital horn FLAIR hyperintensities concerning for chronic demyelination, less likely small old infarcts. We discussed differentials, including small stroke versus demyelinating disease. At this point would recommend starting Plavix (unable to take aspirin due to history of TTP) for secondary stroke prevention, continue control of vascular risk factors. Stroke workup with carotid dopplers and echo with bubble study will be ordered. She will be scheduled for an MRI of the cervical and thoracic spine with and without contrast to assess for other areas of demyelination. She may need a lumbar puncture in the future, we will decide after the MRI. She knows to go to the ER immediately for any change in symptoms. She will follow-up after the tests.   Thank you for allowing me to participate in the care of this patient. Please do not hesitate to call for any questions or concerns.   Ellouise Newer, M.D.  CC: Dr. Maudie Mercury

## 2016-07-20 NOTE — Telephone Encounter (Signed)
Called [employer] to get prior authorization on patient's Carotid Ultrasound (38184) which is not required (confirmation [id number]) and Echocardiogram with bubble study (36067) which also doesn't require authorization (confirmation [id number]).

## 2016-07-21 ENCOUNTER — Telehealth: Payer: Self-pay | Admitting: *Deleted

## 2016-07-21 NOTE — Addendum Note (Signed)
Addended by: Agnes Lawrence on: 07/21/2016 12:10 PM   Modules accepted: Orders

## 2016-07-21 NOTE — Telephone Encounter (Signed)
I do not recall seeing any signs of infection. Can you help her get sat morning appt to check this out? I wish I would have known earlier today as would have squeezed her in to look at it again.

## 2016-07-21 NOTE — Telephone Encounter (Signed)
I called the pt to discuss her lab results and she wanted to let Dr Maudie Mercury know she still has ear pain which is worse today and Dr Maudie Mercury did look at this when she was here this week.  No medications were given for the ear.

## 2016-07-21 NOTE — Telephone Encounter (Signed)
I called the pt and informed her of the message below and transferred her to [place] to schedule an appt for the Saturday clinic.

## 2016-07-22 ENCOUNTER — Encounter: Payer: Self-pay | Admitting: Family Medicine

## 2016-07-22 ENCOUNTER — Ambulatory Visit (INDEPENDENT_AMBULATORY_CARE_PROVIDER_SITE_OTHER): Payer: Managed Care, Other (non HMO) | Admitting: Family Medicine

## 2016-07-22 DIAGNOSIS — H9202 Otalgia, left ear: Secondary | ICD-10-CM | POA: Diagnosis not present

## 2016-07-22 DIAGNOSIS — H9209 Otalgia, unspecified ear: Secondary | ICD-10-CM | POA: Insufficient documentation

## 2016-07-22 NOTE — Patient Instructions (Signed)
Your exam was normal.  Tylenol for pain.  If it persists, please see your PCP to discuss ENT referral.  Take care  Dr. Lacinda Axon

## 2016-07-22 NOTE — Progress Notes (Signed)
   Subjective:  Patient ID: Deanna [name], female    DOB: [birth year]  Age: 56 y.o. MRN: [id number]  CC: Ear pain   HPI:  56 year old female presents with complaints of ear pain.  Patient reports that she's had left ear pain for a week. No recent illness. No URI symptoms. No fever no chills. No other associated symptoms. No known exacerbating or relieving factors. No other complaints or concerns at this time.  Social Hx   Social History   Social History  . Marital status: Married    Spouse name: N/A  . Number of children: N/A  . Years of education: N/A   Social History Main Topics  . Smoking status: Never Smoker  . Smokeless tobacco: Never Used  . Alcohol use No  . Drug use: No  . Sexual activity: Not Asked   Other Topics Concern  . 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   HENT: Positive for ear pain.    Objective:  BP 110/82   Pulse 65   Temp 98.5 F (36.9 C) (Oral)   Wt 224 lb (101.6 kg)   SpO2 96%   BMI 33.08 kg/m   BP/Weight 07/22/2016 08/14/7406 05/18/4816  Systolic BP 563 149 702  Diastolic BP 82 98 82  Wt. (Lbs) 224 224 223.2  BMI 33.08 33.08 33.69   Physical Exam  Constitutional: She is oriented to person, place, and time. She appears well-developed. No distress.  HENT:  Mouth/Throat: Oropharynx is clear and moist.  Normal TMs bilaterally.  Eyes: Conjunctivae are normal.  Neck: Neck supple.  Cardiovascular: Normal rate and regular rhythm.   Murmur heard. Pulmonary/Chest: Effort normal and breath sounds normal.  Lymphadenopathy:    She has no cervical adenopathy.  Neurological: She is alert and oriented to person, place, and time.  Vitals reviewed.   Lab Results  Component  Value Date   WBC 7.2 07/18/2016   HGB 12.1 07/18/2016   HCT 37.2 07/18/2016   PLT 454.0 (H) 07/18/2016   GLUCOSE 88 07/18/2016   CHOL 199 03/30/2016   TRIG 113.0 03/30/2016   HDL 39.60 03/30/2016   LDLDIRECT 150.4 10/04/2012   LDLCALC 137 (H) 03/30/2016   NA 140 07/18/2016   K 4.3 07/18/2016   CL 104 07/18/2016   CREATININE 0.87 07/18/2016   BUN 10 07/18/2016   CO2 30 07/18/2016   TSH 1.33 07/18/2016   HGBA1C 6.0 07/18/2016    Assessment & Plan:   Problem List Items Addressed This Visit    Ear pain    New problem. Normal exam. No evidence of otitis media, cerumen impaction, or otitis externa. I'm not sure what's the source of her pain. Tylenol as needed. Supportive care. Follow-up with her primary if this persists as she may need ENT referral.         Follow-up: PRN  [place]

## 2016-07-22 NOTE — Assessment & Plan Note (Signed)
New problem. Normal exam. No evidence of otitis media, cerumen impaction, or otitis externa. I'm not sure what's the source of her pain. Tylenol as needed. Supportive care. Follow-up with her primary if this persists as she may need ENT referral.

## 2016-07-22 NOTE — Progress Notes (Signed)
Pre visit review using our clinic review tool, if applicable. No additional management support is needed unless otherwise documented below in the visit note. 

## 2016-07-26 ENCOUNTER — Encounter: Payer: Self-pay | Admitting: Neurology

## 2016-07-28 ENCOUNTER — Ambulatory Visit (HOSPITAL_COMMUNITY)
Admission: RE | Admit: 2016-07-28 | Discharge: 2016-07-28 | Disposition: A | Discharge status: Home / Self Care | Payer: Managed Care, Other (non HMO) | Source: Ambulatory Visit | Attending: Neurology | Admitting: Neurology

## 2016-07-28 ENCOUNTER — Ambulatory Visit (HOSPITAL_BASED_OUTPATIENT_CLINIC_OR_DEPARTMENT_OTHER)
Admission: RE | Admit: 2016-07-28 | Discharge: 2016-07-28 | Disposition: A | Discharge status: Home / Self Care | Payer: Managed Care, Other (non HMO) | Source: Ambulatory Visit | Attending: Neurology | Admitting: Neurology

## 2016-07-28 DIAGNOSIS — R200 Anesthesia of skin: Secondary | ICD-10-CM | POA: Insufficient documentation

## 2016-07-28 DIAGNOSIS — R9089 Other abnormal findings on diagnostic imaging of central nervous system: Secondary | ICD-10-CM | POA: Insufficient documentation

## 2016-07-28 DIAGNOSIS — I371 Nonrheumatic pulmonary valve insufficiency: Secondary | ICD-10-CM | POA: Diagnosis not present

## 2016-07-28 DIAGNOSIS — R202 Paresthesia of skin: Secondary | ICD-10-CM | POA: Diagnosis present

## 2016-07-28 DIAGNOSIS — E785 Hyperlipidemia, unspecified: Secondary | ICD-10-CM | POA: Insufficient documentation

## 2016-07-28 DIAGNOSIS — I351 Nonrheumatic aortic (valve) insufficiency: Secondary | ICD-10-CM | POA: Diagnosis not present

## 2016-07-28 NOTE — Progress Notes (Signed)
  Echocardiogram 2D Echocardiogram has been performed.  Renessa Wellnitz L Androw 07/28/2016, 3:23 PM

## 2016-07-28 NOTE — Progress Notes (Signed)
VASCULAR LAB PRELIMINARY  PRELIMINARY  PRELIMINARY  PRELIMINARY  Carotid duplex completed.    Preliminary report:  Bilateral - No evidence of extracranial ICA stenosis. Vertebral artery flow is antegrade  Leonia Heatherly, RVS 07/28/2016, 1:23 PM

## 2016-07-30 LAB — VAS US CAROTID
LEFT ECA DIAS: -8 cm/s
LEFT VERTEBRAL DIAS: -14 cm/s
Left CCA dist dias: -17 cm/s
Left CCA dist sys: -80 cm/s
Left CCA prox dias: 15 cm/s
Left CCA prox sys: 128 cm/s
Left ICA dist dias: -21 cm/s
Left ICA dist sys: -59 cm/s
Left ICA prox dias: -26 cm/s
Left ICA prox sys: -80 cm/s
RIGHT ECA DIAS: -10 cm/s
RIGHT VERTEBRAL DIAS: -10 cm/s
Right CCA prox dias: 15 cm/s
Right CCA prox sys: 93 cm/s
Right cca dist sys: -79 cm/s

## 2016-08-05 ENCOUNTER — Other Ambulatory Visit: Payer: Managed Care, Other (non HMO)

## 2016-08-07 ENCOUNTER — Encounter: Payer: Self-pay | Admitting: Family Medicine

## 2016-08-07 DIAGNOSIS — I77810 Thoracic aortic ectasia: Secondary | ICD-10-CM | POA: Insufficient documentation

## 2016-08-07 DIAGNOSIS — I358 Other nonrheumatic aortic valve disorders: Secondary | ICD-10-CM | POA: Insufficient documentation

## 2016-08-08 ENCOUNTER — Telehealth: Payer: Self-pay | Admitting: Family Medicine

## 2016-08-08 NOTE — Telephone Encounter (Signed)
I can see her neurologist has ordered imaging of the neck, and would recommend appt here or with neurology regarding this. I have not seen her in several weeks and she had a lot going on at that appointment. I do not remember her having R sided neck pain at that appointment.

## 2016-08-08 NOTE — Telephone Encounter (Signed)
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

## 2016-08-08 NOTE — Telephone Encounter (Signed)
I left a detailed message with the information below at the pts cell number. 

## 2016-08-08 NOTE — Telephone Encounter (Signed)
I called the pt and she stated she still has neck pain and questioned what she should do about this?  States she mentioned this to Dr Maudie Mercury at her last visit as initially the pain was radiating from the left ear down to her neck and now complains of right-sided neck pain. Message sent to Dr Maudie Mercury.

## 2016-08-16 LAB — LIPID PANEL
Cholesterol: 151 mg/dL (ref 0–200)
HDL: 32 mg/dL — AB (ref 35–70)
LDL Cholesterol: 100 mg/dL
Triglycerides: 94 mg/dL (ref 40–160)

## 2016-08-16 LAB — BASIC METABOLIC PANEL: Glucose: 84 mg/dL

## 2016-08-17 ENCOUNTER — Telehealth: Payer: Self-pay | Admitting: Neurology

## 2016-08-17 NOTE — Telephone Encounter (Signed)
Caller: Pt  Urgent? No  Reason for the call: Patient would like to have a sedative for her MRI on 08/25/16. Thank you

## 2016-08-17 NOTE — Progress Notes (Signed)
HPI:   Follow up:  Vertigo/Paresthesia/HA/muscle cramps: -had abnormal MRI 07/2016 -referred to neurology - small stroke versus demyelinating disease considered --> she had dopplers, echo, MRI cervical and thoracic pending and she was started on Plavix  -reports she is doing much better -did develop som R sided neck pain the last few weeks, intermittent, triggered by sitting for a long time, R paracervical and R trap muscle soreness that resolves with heat and aleve; no radiation/weakness/numbness/ha/fever/malaise - this is improving and has no symptoms today  Hx Heart murmur, AV sclerosis, Mildly dilated aortic root: -Echo in 2016 and 2018 -no CP, sob, doe, swelling  Obesity/HLD: -meds: pravastatin -had labs at work physical recently - see scanned doc -trying to eat healthier - does eat a sig amount of butter, walking on regular basis  ROS: See pertinent positives and negatives per HPI.  Past Medical History:  Diagnosis Date  . Allergy   . GERD (gastroesophageal reflux disease)    "does not need meds anymore"  . Heart murmur   . Hyperlipidemia   . Lactose intolerance   . 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HCC)     Past Surgical History:  Procedure Laterality Date  . ABDOMINAL HYSTERECTOMY  2006   benign tumor, complete  . KNEE ARTHROSCOPY Right   . NASAL SEPTUM SURGERY    . 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 Arthritis Mother   . Hyperlipidemia Mother   . Stroke Mother   . Hypertension Mother   . Colon cancer Neg Hx     Social History   Social History  . Marital status: Married    Spouse name: N/A  . Number of children: N/A  . Years of education: N/A   Social History Main Topics  . Smoking status: Never Smoker  . Smokeless tobacco: Never Used  . Alcohol use No  . Drug use: No  . Sexual activity: Not Asked   Other Topics Concern  . 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  clopidogrel (PLAVIX) 75 MG tablet, Take 1 tablet (75 mg total) by mouth daily., Disp: 30 tablet, Rfl: 11 .  meclizine (ANTIVERT) 25 MG tablet, Take 1 tablet (25 mg total) by mouth 3 (three) times daily as needed for dizziness., Disp: 30 tablet, Rfl: 0 .  pravastatin (PRAVACHOL) 40 MG tablet, Take 1 tablet (40 mg total) by mouth daily., Disp: 90 tablet, Rfl: 3 .  triamcinolone cream (KENALOG) 0.1 %, Apply 1 application topically 2 (two) times daily., Disp: 30 g, Rfl: 1  EXAM:  Vitals:   08/18/16 1002  BP: 122/80  Pulse: 70  Temp: 98.4 F (36.9 C)    Body mass index is 34.29 kg/m.  GENERAL: vitals reviewed and listed above, alert, oriented, appears well hydrated and in no acute distress  HEENT: atraumatic, conjunttiva clear, no obvious abnormalities on inspection of external nose and ears  NECK: no obvious masses on inspection  LUNGS: clear to auscultation bilaterally, no wheezes, rales or rhonchi, good air movement  CV: HRRR, faint SEM,  no peripheral edema  MS: moves all extremities without noticeable abnormality, normal inspection neck and extremities, no bony or muscular TTP today, normal ROM head and neck, spurling neg, normal strength/sensitivity to light touch bilat UEs  PSYCH: pleasant and cooperative, no obvious depression or anxiety  ASSESSMENT AND PLAN:  Discussed the following assessment and plan:  Neck pain  Hyperglycemia  Class 1 obesity due to excess calories with serious comorbidity and body mass index (BMI) of 34.0 to 34.9 in adult  Hyperlipidemia, unspecified hyperlipidemia type  Abnormal MRI  -discussed potential etiologies neck pain and feel musculoskeletal etiology most likely - seems to be  resolving, has bending MRI with neurology, symptomatic care and isometric postural exercises in interim -reviewed labs, discussed changing statin, she opted to continue current med for now and work on [employer] with regular exercise -she is seeing neurology for eval - we discussed CV risks modification, healthy lifestyle at length -Patient advised to return or notify a doctor immediately if symptoms worsen or persist or new concerns arise.  Patient Instructions  BEFORE YOU LEAVE: -follow up: 3-4 months  For the neck pain: -get studies ordered by neurologist as planned -exercises for posture  -heat and as needed topical menthol (tiger balm) or tylenol I hope you are feeling better soon! Seek care immediately if worsening, new concerns or you are not improving with treatment.   We recommend the following healthy lifestyle for LIFE: 1) Small portions.   Tip: eat off of a salad plate instead of a dinner plate.  Tip: It is ok to feel hungry after a meal of proper portion sizes  Tip: if you need more or a snack choose fruits, veggies and/or a handful of nuts or seeds.  2) Eat a healthy clean diet.  * Tip: Avoid (less then 1 serving per week): processed foods, sweets, sweetened drinks, white starches (rice, flour, bread, potatoes, pasta, etc), red meat, fast foods, butter  *Tip: CHOOSE instead   * 5-9 servings per day of fresh or frozen fruits and vegetables (but not corn, potatoes, bananas, canned or dried fruit)   *nuts and seeds, beans   *olives and olive oil   *small portions of lean meats such as fish and white chicken    *small portions of whole grains  3)Get at least 150 minutes of sweaty aerobic exercise per week.  4)Reduce stress - consider counseling, meditation and relaxation to balance other aspects of your life.     Colin Benton R., DO

## 2016-08-17 NOTE — Telephone Encounter (Signed)
Will forward message to provider. 

## 2016-08-18 ENCOUNTER — Ambulatory Visit (INDEPENDENT_AMBULATORY_CARE_PROVIDER_SITE_OTHER): Payer: Managed Care, Other (non HMO) | Admitting: Family Medicine

## 2016-08-18 ENCOUNTER — Encounter: Payer: Self-pay | Admitting: Family Medicine

## 2016-08-18 VITALS — BP 118/76 | HR 70 | Temp 98.4°F | Ht 68.25 in | Wt 227.2 lb

## 2016-08-18 DIAGNOSIS — R938 Abnormal findings on diagnostic imaging of other specified body structures: Secondary | ICD-10-CM | POA: Diagnosis not present

## 2016-08-18 DIAGNOSIS — R739 Hyperglycemia, unspecified: Secondary | ICD-10-CM

## 2016-08-18 DIAGNOSIS — E6609 Other obesity due to excess calories: Secondary | ICD-10-CM | POA: Diagnosis not present

## 2016-08-18 DIAGNOSIS — Z6834 Body mass index (BMI) 34.0-34.9, adult: Secondary | ICD-10-CM | POA: Diagnosis not present

## 2016-08-18 DIAGNOSIS — M542 Cervicalgia: Secondary | ICD-10-CM | POA: Diagnosis not present

## 2016-08-18 DIAGNOSIS — R9389 Abnormal findings on diagnostic imaging of other specified body structures: Secondary | ICD-10-CM

## 2016-08-18 DIAGNOSIS — E785 Hyperlipidemia, unspecified: Secondary | ICD-10-CM | POA: Diagnosis not present

## 2016-08-18 NOTE — Telephone Encounter (Signed)
Ok to give Valium 5mg  x 1 dose prior to MRI. May take second dose if needed. Dispense #2 with no refills. Thanks

## 2016-08-18 NOTE — Patient Instructions (Signed)
BEFORE YOU LEAVE: -follow up: 3-4 months  For the neck pain: -get studies ordered by neurologist as planned -exercises for posture  -heat and as needed topical menthol (tiger balm) or tylenol I hope you are feeling better soon! Seek care immediately if worsening, new concerns or you are not improving with treatment.   We recommend the following healthy lifestyle for LIFE: 1) Small portions.   Tip: eat off of a salad plate instead of a dinner plate.  Tip: It is ok to feel hungry after a meal of proper portion sizes  Tip: if you need more or a snack choose fruits, veggies and/or a handful of nuts or seeds.  2) Eat a healthy clean diet.  * Tip: Avoid (less then 1 serving per week): processed foods, sweets, sweetened drinks, white starches (rice, flour, bread, potatoes, pasta, etc), red meat, fast foods, butter  *Tip: CHOOSE instead   * 5-9 servings per day of fresh or frozen fruits and vegetables (but not corn, potatoes, bananas, canned or dried fruit)   *nuts and seeds, beans   *olives and olive oil   *small portions of lean meats such as fish and white chicken    *small portions of whole grains  3)Get at least 150 minutes of sweaty aerobic exercise per week.  4)Reduce stress - consider counseling, meditation and relaxation to balance other aspects of your life.

## 2016-08-18 NOTE — Progress Notes (Signed)
Pre visit review using our clinic review tool, if applicable. No additional management support is needed unless otherwise documented below in the visit note. 

## 2016-08-21 MED ORDER — DIAZEPAM 5 MG PO TABS: ORAL_TABLET | ORAL | 0 refills | Status: DC

## 2016-08-21 NOTE — Telephone Encounter (Signed)
Clld pt  - advsd of provider's notation. Pt stated she understood.   Will fax Rx to [facility] once provider signs.

## 2016-08-25 ENCOUNTER — Ambulatory Visit
Admission: RE | Admit: 2016-08-25 | Discharge: 2016-08-25 | Disposition: A | Discharge status: Home / Self Care | Payer: Managed Care, Other (non HMO) | Source: Ambulatory Visit | Attending: Neurology | Admitting: Neurology

## 2016-08-25 DIAGNOSIS — R202 Paresthesia of skin: Secondary | ICD-10-CM

## 2016-08-25 DIAGNOSIS — R9089 Other abnormal findings on diagnostic imaging of central nervous system: Secondary | ICD-10-CM

## 2016-08-25 DIAGNOSIS — R200 Anesthesia of skin: Secondary | ICD-10-CM

## 2016-08-25 MED ORDER — GADOBENATE DIMEGLUMINE 529 MG/ML IV SOLN
20.0000 mL | Freq: Once | INTRAVENOUS | Status: AC | PRN
  Administered 2016-08-25: 20 mL via INTRAVENOUS

## 2016-08-29 ENCOUNTER — Telehealth: Payer: Self-pay | Admitting: Neurology

## 2016-08-29 NOTE — Telephone Encounter (Signed)
Caller: Patient  Urgent? No  Reason for the call: She received a letter from her [employer] saying they were not going to cover her Echo Bubble and Carotid. They told her it was not a necessary procedure. Please call. Thanks

## 2016-08-30 NOTE — Telephone Encounter (Signed)
Clld [employer]/[id number] - spoke to Albert/[employer].  Gwenlyn Perking advised that the information I gave him from 07/20/16 was incorrect.        conf# [id number] - Carotid US ([id number])  conf# [id number] - Echocardiogram                                                                 w/bubble study                                                                   ([id number])  [employer] reference/confirmation numbers are 4 digits not 5.   The procedures were denied  - he advsd to have the patient Deanna [name] to obtain the claim number, instructions to begin an appeal.  Gwenlyn Perking advised that once they speak to the patient , she will then contact our office with the information for the provider to write a letter stating why the procedures were a necessity . Our office will then need to fax that letter with the current office notes to:   [name] Fax# [phone] Claim#   Clld pt - [place] re letter she received.

## 2016-08-30 NOTE — Telephone Encounter (Signed)
Pt returned call - advsd of previous notation. Pt stated she will bring the letter she received from [employer] to our office for a copy to made in order for dr. Delice Lesch to write appeals letter.   Please forward copy of letter to Dr. Delice Lesch.

## 2016-09-11 ENCOUNTER — Encounter: Payer: Self-pay | Admitting: Neurology

## 2016-09-25 ENCOUNTER — Ambulatory Visit (INDEPENDENT_AMBULATORY_CARE_PROVIDER_SITE_OTHER): Payer: Managed Care, Other (non HMO) | Admitting: Neurology

## 2016-09-25 ENCOUNTER — Encounter: Payer: Self-pay | Admitting: Neurology

## 2016-09-25 VITALS — BP 118/76 | HR 79 | Ht 68.0 in | Wt 225.0 lb

## 2016-09-25 DIAGNOSIS — M542 Cervicalgia: Secondary | ICD-10-CM | POA: Diagnosis not present

## 2016-09-25 DIAGNOSIS — M5412 Radiculopathy, cervical region: Secondary | ICD-10-CM | POA: Diagnosis not present

## 2016-09-25 DIAGNOSIS — R202 Paresthesia of skin: Secondary | ICD-10-CM | POA: Diagnosis not present

## 2016-09-25 DIAGNOSIS — R200 Anesthesia of skin: Secondary | ICD-10-CM

## 2016-09-25 DIAGNOSIS — R9089 Other abnormal findings on diagnostic imaging of central nervous system: Secondary | ICD-10-CM | POA: Insufficient documentation

## 2016-09-25 NOTE — Patient Instructions (Signed)
1. Refer to PT for neck pain and cervical radiculopathy 2. Schedule a repeat MRI brain with and without contrast for September 2018 3. Follow-up after MRI, call for any change in symptoms 4. Continue Plavix, control of blood pressure, cholesterol 5. Go to ER immediately for sudden change in symptoms

## 2016-09-25 NOTE — Progress Notes (Signed)
NEUROLOGY FOLLOW UP OFFICE NOTE  Deanna [name] [id number] Aug 08, 1960  HISTORY OF PRESENT ILLNESS: I had the pleasure of seeing Deanna [name] in follow-up in the neurology clinic on 09/25/2016.  The patient was last seen 2 months ago after she had an episode of left hand numbness and tingling, as well as reporting a 3-year history of intermittent dizzy spells. Her MRI brain showed a faint punctate focus of reduced diffusion in the right frontal lobe deep white matter, possibly due to acute ischemia or hyperacute demyelination. There were scattered FLAIR hyperintensities seen bilaterally. Records and images were personally reviewed where available. Stroke workup with echocardiogram and carotid dopplers were overall unremarkable, there was grade 1 diastolic dysfunction, left atrium normal. Note of mildly dilated aortic root. Carotid dopplers normal. She was started on Plavix on her initial visit. I personally reviewed MRI cervical and thoracic spine with and without contrast did not show any evidence of demyelinating disease. 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 There were disc protrusions/bulges at several levels in the thoracic spine. She reports the dizziness is gone. She still has occasional left hand numbness and tingling "like going to sleep." No headaches, vision changes/loss of vision, weakness. She has a lot of neck pain. No falls.   HPI 07/20/2016: This is a pleasant 56 yo RH woman with a history of hyperlipidemia who reported a 3-year history of intermittent episodes of dizziness described as spinning, as well as feeling lightheaded with blurred vision lasting 20-30 seconds. Recently she started having different symptoms, around 2 weeks ago she started getting carsick, which is new for her. A week ago, her  left hand was numb, "like it was not there," then 20-30 seconds later it started tingling. Face and leg were unaffected. She did not have any neck pain at that time, but 3 days later started having a headache with pressure over the frontal regions that she ascribed to her sinuses. She started having pretty significant intermittent nausea. She continued to have dizziness, last episode was a week ago. The headaches have resolved. Blurred vision still comes and goes. She denies any diplopia, dysarthria/dysphagia, back pain, bowel/bladder dysfunction, negative Lhermitte sign. She denies any prior history of focal symptoms, no prior history of vision loss. She denies any head injuries. 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 There is a family history of stroke.  She had an MRI brain with and without contrast last 07/18/2016 which I personally reviewed, there was a faint punctate focus of reduced diffusion in the right frontal lobe deep white matter, difficult to see on the ADC map due to small size, this can be seen with acute ischemia or hyperacute demyelination. There were at least 5 additional subcentimeter white matter, right parietal, medial left thalamus, and left occipital horn FLAIR hyperintensities. There was mild prominence of the ventricles and sulci for age. No abnormal enhancement seen. 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   PAST MEDICAL HISTORY: Past Medical History:  Diagnosis Date  . Allergy   . GERD (gastroesophageal reflux disease)    "does not need meds anymore"  . Heart murmur   . Hyperlipidemia   . Lactose intolerance   . 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HCC)     MEDICATIONS: Current Outpatient Prescriptions on  File Prior to Visit  Medication Sig Dispense Refill  . clopidogrel (PLAVIX) 75 MG tablet Take 1 tablet (75 mg total) by mouth daily. 30 tablet 11  . diazepam (VALIUM) 5 MG  tablet Take 1 tablet ( 5 mg) prior to MRI. May take second dose if needed. 2 tablet 0  . meclizine (ANTIVERT) 25 MG tablet Take 1 tablet (25 mg total) by mouth 3 (three) times daily as needed for dizziness. 30 tablet 0  . pravastatin (PRAVACHOL) 40 MG tablet Take 1 tablet (40 mg total) by mouth daily. 90 tablet 3  . triamcinolone cream (KENALOG) 0.1 % Apply 1 application topically 2 (two) times daily. 30 g 1   No current facility-administered medications on file prior to visit.     ALLERGIES: Allergies  Allergen Reactions  . Eggs Or Egg-Derived Products Nausea And Vomiting  . Lactose Intolerance (Gi) Other (See Comments)    vomiting    FAMILY HISTORY: Family History  Problem Relation Age of Onset  . Arthritis Mother   . Hyperlipidemia Mother   . Stroke Mother   . Hypertension Mother   . Colon cancer Neg Hx     SOCIAL HISTORY: Social History   Social History  . Marital status: Married    Spouse name: N/A  . Number of children: N/A  . Years of education: N/A   Occupational History  . Not on file.   Social History Main Topics  . Smoking status: Never Smoker  . Smokeless tobacco: Never Used  . Alcohol use No  . Drug use: No  . Sexual activity: Not on file   Other Topics Concern  . 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: lives 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, husband and mother      Spiritual Beliefs: Christian      Lifestyle: walks a little a few days per week, diet is ok             REVIEW OF SYSTEMS: Constitutional: No fevers, chills, or sweats, no generalized fatigue, change in appetite Eyes: No visual changes, double vision, eye pain Ear, nose and throat: No hearing loss, ear pain, nasal congestion, sore throat Cardiovascular: No chest pain, palpitations Respiratory:  No shortness of breath at rest or with exertion, wheezes GastrointestinaI: No nausea, vomiting, diarrhea, abdominal pain, fecal  incontinence Genitourinary:  No dysuria, urinary retention or frequency Musculoskeletal:  + neck pain, back pain Integumentary: No rash, pruritus, skin lesions Neurological: as above Psychiatric: No depression, insomnia, anxiety Endocrine: No palpitations, fatigue, diaphoresis, mood swings, change in appetite, change in weight, increased thirst Hematologic/Lymphatic:  No anemia, purpura, petechiae. Allergic/Immunologic: no itchy/runny eyes, nasal congestion, recent allergic reactions, rashes  PHYSICAL EXAM: Vitals:   09/25/16 1515  BP: 118/76  Pulse: 79   General: No acute distress Head:  Normocephalic/atraumatic Neck: supple, no paraspinal tenderness, full range of motion Heart:  Regular rate and rhythm Lungs:  Clear to auscultation bilaterally Back: No paraspinal tenderness Skin/Extremities: No rash, no edema Neurological Exam: alert and oriented to person, place, and time. No aphasia or dysarthria. Fund of knowledge is appropriate.  Recent and remote memory are intact.  Attention and concentration are normal.    Able to name objects and repeat phrases. Cranial nerves: Pupils equal, round, reactive to light.  Fundoscopic exam unremarkable, no papilledema. Extraocular movements intact with no nystagmus. Visual fields full. Facial sensation intact. No facial asymmetry. Tongue,  uvula, palate midline.  Motor: Bulk and tone normal, muscle strength 5/5 throughout with no pronator drift.  Sensation to light touch, temperature and vibration intact.  No extinction to double simultaneous stimulation.  Deep tendon reflexes 2+ throughout, toes downgoing.  Finger to nose testing intact.  Gait narrow-based and steady, able to tandem walk adequately.  Romberg negative.  IMPRESSION: This is a 56 yo RH woman with a history of hyperlipidemia, with a 3-year history of intermittent dizziness suggestive of vertigo, who presented with new onset symptoms of worsening dizziness with nausea, headaches, and a  transient episode of left hand numbness and tingling. She denies any further dizziness or headaches. She has occasional numbness and tingling in her left hand. MRI brain had shown a faint punctate focus of reduced diffusion in the right frontal lobe deep white matter, difficult to see on the ADC map due to small size, this can be seen with acute ischemia or hyperacute demyelination. This seemed to correlate with her left hand symptoms, however she also has left C5 radiculopathy on her cervical MRI. She will be referred to PT for neck pain and cervical radiculopathy, consideration for symptomatic treatment with gabapentin can be done in the future. There was concern about demyelination on the brain MRI, her cervical and thoracic MRI did not show any demyelinating lesions. We have discussed close clinical monitoring at this time, a repeat MRI brain with and without contrast will be repeated 6 months from prior MRI. Her stroke workup was unremarkable, would continue daily Plavix, control of vascular risk factors. She knows to go to the ER immediately for any change in symptoms. She will follow-up in 4 months and knows to call for any changes.   Thank you for allowing me to participate in her care.  Please do not hesitate to call for any questions or concerns.  The duration of this appointment visit was 25 minutes of face-to-face time with the patient.  Greater than 50% of this time was spent in counseling, explanation of diagnosis, planning of further management, and coordination of care.   Ellouise Newer, M.D.   CC: Dr. Maudie Mercury

## 2016-09-29 ENCOUNTER — Telehealth: Payer: Self-pay | Admitting: Neurology

## 2016-09-29 ENCOUNTER — Ambulatory Visit: Payer: Managed Care, Other (non HMO) | Admitting: Neurology

## 2016-09-29 NOTE — Telephone Encounter (Signed)
PT called and said she has not heard from Physical Therapy yet

## 2016-10-03 ENCOUNTER — Other Ambulatory Visit: Payer: Self-pay | Admitting: *Deleted

## 2016-10-03 MED ORDER — PRAVASTATIN SODIUM 40 MG PO TABS: 40.0000 mg | ORAL_TABLET | Freq: Every day | ORAL | 0 refills | Status: DC

## 2016-10-03 NOTE — Telephone Encounter (Signed)
Rx done. 

## 2016-10-03 NOTE — Telephone Encounter (Signed)
Patient was calling back to follow up on her PT referral. Thanks

## 2016-10-04 ENCOUNTER — Other Ambulatory Visit: Payer: Self-pay

## 2016-10-04 DIAGNOSIS — R202 Paresthesia of skin: Secondary | ICD-10-CM

## 2016-10-04 DIAGNOSIS — R200 Anesthesia of skin: Secondary | ICD-10-CM

## 2016-10-04 DIAGNOSIS — M542 Cervicalgia: Secondary | ICD-10-CM

## 2016-10-04 NOTE — Telephone Encounter (Signed)
Returned pt call.  I apologized for the delay and explained that I had in fact placed the order incorrectly.  I have corrected the order in [facility] and she should get a call to schedule.  Pt let me know that she is bruising very easily know, which she expected while taking Plavix.  She has an appointment tomorrow to check her platelet count.  I asked that she call our office afterwards to update [place] to see if we need to make any adjustments in her medications.  Pt appreciative

## 2016-10-05 ENCOUNTER — Ambulatory Visit (INDEPENDENT_AMBULATORY_CARE_PROVIDER_SITE_OTHER): Payer: Managed Care, Other (non HMO) | Admitting: Family Medicine

## 2016-10-05 ENCOUNTER — Encounter: Payer: Self-pay | Admitting: Family Medicine

## 2016-10-05 VITALS — BP 110/80 | HR 66 | Temp 98.3°F | Ht 68.0 in | Wt 224.4 lb

## 2016-10-05 DIAGNOSIS — T148XXA Other injury of unspecified body region, initial encounter: Secondary | ICD-10-CM | POA: Diagnosis not present

## 2016-10-05 DIAGNOSIS — M542 Cervicalgia: Secondary | ICD-10-CM | POA: Diagnosis not present

## 2016-10-05 DIAGNOSIS — R5383 Other fatigue: Secondary | ICD-10-CM | POA: Diagnosis not present

## 2016-10-05 DIAGNOSIS — D649 Anemia, unspecified: Secondary | ICD-10-CM | POA: Diagnosis not present

## 2016-10-05 LAB — CBC WITH DIFFERENTIAL/PLATELET
Basophils Absolute: 0.1 10*3/uL (ref 0.0–0.1)
Basophils Relative: 0.8 % (ref 0.0–3.0)
Eosinophils Absolute: 0.1 10*3/uL (ref 0.0–0.7)
Eosinophils Relative: 2 % (ref 0.0–5.0)
HCT: 35.3 % — ABNORMAL LOW (ref 36.0–46.0)
Hemoglobin: 11.7 g/dL — ABNORMAL LOW (ref 12.0–15.0)
Lymphocytes Relative: 35.6 % (ref 12.0–46.0)
Lymphs Abs: 2.6 10*3/uL (ref 0.7–4.0)
MCHC: 33.2 g/dL (ref 30.0–36.0)
MCV: 86.5 fl (ref 78.0–100.0)
Monocytes Absolute: 0.7 10*3/uL (ref 0.1–1.0)
Monocytes Relative: 9.7 % (ref 3.0–12.0)
Neutro Abs: 3.7 10*3/uL (ref 1.4–7.7)
Neutrophils Relative %: 51.9 % (ref 43.0–77.0)
Platelets: 407 10*3/uL — ABNORMAL HIGH (ref 150.0–400.0)
RBC: 4.08 Mil/uL (ref 3.87–5.11)
RDW: 14.2 % (ref 11.5–15.5)
WBC: 7.2 10*3/uL (ref 4.0–10.5)

## 2016-10-05 NOTE — Patient Instructions (Signed)
BEFORE YOU LEAVE: -lab  Try the Tiger balm (menthol) while waiting on the physical therapy for the neck issues.  I hope you are feeling better soon! Seek care immediately if worsening, new concerns or you are not improving.

## 2016-10-05 NOTE — Progress Notes (Signed)
HPI:  Acute visit for several issues. Dealing with neck pain for some time and reports she is seeing the neurolgoist for this and that PT was advised. She is frustrated because it is taking a long time to get the PT set up. Noticed bruising easier since starting plavix. Has a bruise on her R leg and on her butt and felt tired this week. Reports her neurologist told her to check platelets, but she couldn't get an appointment here to check this lab so came here. Wants me to forward results to her neurologist as reports she was told the Plavix could lower her platelets and they would adjust the dose if needed. Hx remote TTP with a pregnancy. Has been taking aleve for the neck pain.   ROS: See pertinent positives and negatives per HPI.  Past Medical History:  Diagnosis Date  . Allergy   . GERD (gastroesophageal reflux disease)    "does not need meds anymore"  . Heart murmur   . Hyperlipidemia   . Lactose intolerance   . 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HCC)     Past Surgical History:  Procedure Laterality Date  . ABDOMINAL HYSTERECTOMY  2006   benign tumor, complete  . KNEE ARTHROSCOPY Right   . NASAL SEPTUM SURGERY    . 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 Arthritis Mother   . Hyperlipidemia Mother   . Stroke Mother   . Hypertension Mother   . Colon cancer Neg Hx     Social History   Social History  . Marital status: Married    Spouse name: N/A  . Number of children: N/A  . Years of education: N/A   Social History Main Topics  . Smoking status: Never Smoker  . Smokeless tobacco: Never Used  . Alcohol use No  . Drug use: No  . Sexual activity: Not Asked   Other Topics Concern  . 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  clopidogrel (PLAVIX) 75 MG tablet, Take 1 tablet (75 mg total) by mouth daily., Disp: 30 tablet, Rfl: 11 .  meclizine (ANTIVERT) 25 MG tablet, Take 1 tablet (25 mg total) by mouth 3 (three) times daily as needed for dizziness., Disp: 30 tablet, Rfl: 0 .  pravastatin (PRAVACHOL) 40 MG tablet, Take 1 tablet (40 mg total) by mouth daily., Disp: 90 tablet, Rfl: 0 .  triamcinolone cream (KENALOG) 0.1 %, Apply 1 application topically 2 (two) times daily., Disp: 30 g, Rfl: 1  EXAM:  Vitals:   10/05/16 1317  BP: 110/80  Pulse: 66  Temp: 98.3 F (36.8 C)    Body mass index is 34.12 kg/m.  GENERAL: vitals reviewed and listed above, alert, oriented, appears well hydrated and in no acute distress  HEENT: atraumatic, conjunttiva clear, no obvious abnormalities on inspection of external nose and ears  NECK: no obvious masses on inspection  LUNGS: clear to auscultation bilaterally, no wheezes, rales or rhonchi, good air movement  CV: HRRR, no peripheral edema  SKIN: small bruise L upper leg, med  sized bruise R tailbone  MS: moves all extremities without noticeable abnormality  PSYCH: pleasant and cooperative, no obvious depression or anxiety  ASSESSMENT AND PLAN:  Discussed the following assessment and plan:  Neck pain  Bruising - Plan: CBC with Differential/Platelet  Fatigue, unspecified type  -cbc -discussed options for pain management for neck pain - potentially radicular, seeing neurology for this -trial top menthol for now -Patient advised to return or notify a doctor immediately if symptoms worsen or persist or new concerns arise.  Patient Instructions  BEFORE YOU LEAVE: -lab  Try the Tiger balm (menthol) while waiting on the physical therapy for the neck issues.  I hope you are feeling better soon! Seek care immediately if worsening, new concerns or you are not  improving.          Deanna [name] R., DO

## 2016-10-06 NOTE — Addendum Note (Signed)
Addended by: Agnes Lawrence on: 10/06/2016 01:53 PM   Modules accepted: Orders

## 2016-10-12 ENCOUNTER — Ambulatory Visit: Payer: Managed Care, Other (non HMO) | Attending: Neurology | Admitting: Physical Therapy

## 2016-10-12 ENCOUNTER — Encounter: Payer: Self-pay | Admitting: Physical Therapy

## 2016-10-12 DIAGNOSIS — M25612 Stiffness of left shoulder, not elsewhere classified: Secondary | ICD-10-CM | POA: Insufficient documentation

## 2016-10-12 DIAGNOSIS — M542 Cervicalgia: Secondary | ICD-10-CM | POA: Insufficient documentation

## 2016-10-12 DIAGNOSIS — M62838 Other muscle spasm: Secondary | ICD-10-CM | POA: Diagnosis present

## 2016-10-12 NOTE — Patient Instructions (Signed)
Posture - Standing   Good posture is important. Avoid slouching and forward head thrust. Maintain curve in low back and align ears over shoulders, hips over ankles.  Pull your belly button in toward your back bone. Posture Tips DO: - stand tall and erect - keep chin tucked in - keep head and shoulders in alignment - check posture regularly in mirror or large window - pull head back against headrest in car seat;  Change your position often.  Sit with lumbar support. DON'T: - slouch or slump while watching TV or reading - sit, stand or lie in one position  for too long;  Sitting is especially hard on the spine so if you sit at a desk/use the computer, then stand up often! Copyright  VHI. All rights reserved.  Posture - Sitting  Sit upright, head facing forward. Try using a roll to support lower back. Keep shoulders relaxed, and avoid rounded back. Keep hips level with knees. Avoid crossing legs for long periods. Copyright  VHI. All rights reserved.  Chronic neck strain can develop because of poor posture and faulty work habits  Postural strain related to slumped sitting and forward head posture is a leading cause of headaches, neck and upper back pain  General strengthening and flexibility exercises are helpful in the treatment of neck pain.  Most importantly, you should learn to correct the posture that may be contributing to chronic pain.   Change positions frequently  Change your work or home environment to improve posture and mechanics.      Upper Trap Stretch  In sitting, bring your head slightly forward, right ear to your right shoulder and then turn to look at your knee.  This will stretch on the left side.  To stretch the right bring your left ear to your left shoulder and turn to look at the knee.  Repeat 3x 20 second hold

## 2016-10-12 NOTE — Therapy (Signed)
[facility] [address] [place], [place], [address] Phone: [phone]   Fax:  [phone]  Physical Therapy Evaluation  Patient Details  Name: Deanna [name] MRN: [id number] Date of Birth: February 06, 1961 Referring Provider: Cameron Sprang  Encounter Date: 10/12/2016      PT End of Session - 10/12/16 1403    Visit Number 1   Date for PT Re-Evaluation 12/07/16   PT Start Time 2585   PT Stop Time 1227   PT Time Calculation (min) 43 min   Activity Tolerance Patient tolerated treatment well   Behavior During Therapy [facility] for tasks assessed/performed      Past Medical History:  Diagnosis Date  . Allergy   . GERD (gastroesophageal reflux disease)    "does not need meds anymore"  . Heart murmur   . Hyperlipidemia   . Lactose intolerance   . 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HCC)     Past Surgical History:  Procedure Laterality Date  . ABDOMINAL HYSTERECTOMY  2006   benign tumor, complete  . KNEE ARTHROSCOPY Right   . NASAL SEPTUM SURGERY    . 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       Subjective Assessment - 10/12/16 1145    Subjective The second week in Feb they suspected mini stroke and I have had pain in neck.  When it's at the worst it is a 10/10.  Sitting for a long time.  I am using a neck pillow when sitting on the couch   Limitations Sitting   Patient Stated Goals get rid,    Currently in Pain? Yes   Pain Score 2    Pain Location Neck   Pain Orientation Right;Left;Lower   Pain Descriptors / Indicators Aching;Tingling   Pain Radiating Towards down left arm, fingers going numb   Pain Onset More than a month ago   Pain Frequency Intermittent   Aggravating Factors  sitting   Pain Relieving Factors walking around, neck pillow, aleve   Effect of Pain on Daily Activities not really   Multiple Pain Sites No            [facility] PT  Assessment - 10/12/16 0001      Assessment   Medical Diagnosis M54.2 (ICD-10-CM) - Neck pain; R20.0,R20.2 (ICD-10-CM) - Numbness and tingling in left hand   Referring Provider Ellouise Newer M   Onset Date/Surgical Date 07/12/16   Prior Therapy No     Precautions   Precautions None     Restrictions   Weight Bearing Restrictions No     Balance Screen   Has the patient fallen in the past 6 months No     [place] residence   Living Arrangements Spouse/significant other     Prior Function   Level of Independence Independent     Cognition   Overall Cognitive Status Within Functional Limits for tasks assessed     Observation/Other Assessments   Focus on Therapeutic Outcomes (FOTO)  31% limitation  26% limitation goal     Posture/Postural Control   Posture/Postural Control Postural limitations   Postural Limitations Decreased thoracic kyphosis     ROM / Strength   AROM / PROM / Strength AROM;Strength     AROM   Overall AROM Comments left shoulder flexion and abduction WFL tight end range   AROM Assessment Site Cervical   Cervical  Flexion 20   Cervical Extension 55   Cervical - Right Side Bend WFL   Cervical - Left Side Bend WFL   Cervical - Right Rotation 45   Cervical - Left Rotation 55     Strength   Overall Strength Comments neck, shoulder strength WNL     Palpation   Palpation comment left side tight and tender to suboccipitals, upper trap, scalenes, rhomboids     Ambulation/Gait   Gait Pattern Within Functional Limits            Objective measurements completed on examination: See above findings.          [facility] Adult PT Treatment/Exercise - 10/12/16 0001      Exercises   Exercises Neck     Neck Exercises: Standing   Other Standing Exercises upper trap stretch 3x 20 sec     Manual Therapy   Manual Therapy Soft tissue mobilization   Soft tissue mobilization left rhomboids, upper trap, suboccipitals                 PT Education - 10/12/16 1350    Education provided Yes   Education Details posture and upper trap stretch   Person(s) Educated Patient   Methods Explanation;Demonstration   Comprehension Verbalized understanding;Returned demonstration          PT Short Term Goals - 10/12/16 1412      PT SHORT TERM GOAL #1   Title be independent in initial HEP   Time 4   Period Weeks   Status New     PT SHORT TERM GOAL #2   Title report 30% reduction in cervical pain and stiffness after sitting long periods for work   Time 4   Period Weeks   Status New     PT SHORT TERM GOAL #3   Title increased cervical flexion to 40 degrees or more due to increased soft tissue length   Time 4   Period Weeks   Status New           PT Long Term Goals - 10/12/16 1414      PT LONG TERM GOAL #1   Title be independent in advanced HEP   Time 8   Period Weeks   Status New     PT LONG TERM GOAL #2   Title reduce FOTO to < or = to 26% limitation   Time 8   Period Weeks   Status New     PT LONG TERM GOAL #3   Title demonstrate correct posture and body mechanics for ADLs and home tasks to reduce cervical stress   Time 8   Period Weeks   Status New     PT LONG TERM GOAL #4   Title report a 75% reduction in cervical pain with sitting long periods   Time 8   Period Weeks   Status New     PT LONG TERM GOAL #5   Title .Marland KitchenMarland Kitchen                Plan - 10/12/16 1405    Clinical Impression Statement Pt presents to PT with neck pain that has been there since February of this year.  Patient has multiple muscle spasms as listed above.  Pt works at computer all day and has decreased thoracic kyphosis.  Her pain increases as she sits.  Pt has hypmobilty of thoracic flexion.  Pt also has decreased cervical flexion and rotation AROM.  Pt will benefit from  skilled PT to address these deficits and return to function at her job and responsibiities with taking care of her mother.    History and Personal Factors relevant to plan of care: history of TIA   Clinical Presentation Stable   Clinical Presentation due to: Not progressing   Clinical Decision Making Low   Rehab Potential Excellent   PT Frequency 2x / week   PT Duration 8 weeks   PT Treatment/Interventions ADLs/Self Care Home Management;Biofeedback;Cryotherapy;Electrical Stimulation;Iontophoresis 4mg /ml Dexamethasone;Moist Heat;Traction;Ultrasound;Gait training;Stair training;Functional mobility training;Therapeutic activities;Therapeutic exercise;Balance training;Neuromuscular re-education;Patient/family education;Manual techniques;Passive range of motion;Dry needling;Taping   PT Next Visit Plan manual and modalities as needed for soft tissue lengthening, postural strengthening, review posture and follow up with addition of lumbar support in chair   Recommended Other Services n/a   Consulted and Agree with Plan of Care Patient      Patient will benefit from skilled therapeutic intervention in order to improve the following deficits and impairments:  Pain, Postural dysfunction, Increased muscle spasms, Decreased range of motion  Visit Diagnosis: Cervicalgia - Plan: PT plan of care cert/re-cert  Stiffness of left shoulder, not elsewhere classified - Plan: PT plan of care cert/re-cert  Other muscle spasm - Plan: PT plan of care cert/re-cert     Problem List Patient Active Problem List   Diagnosis Date Noted  . Abnormal brain MRI 09/25/2016  . Numbness and tingling in left hand 09/25/2016  . Cervical radiculopathy at C5 09/25/2016  . Neck pain 09/25/2016  . 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 Aortic root dilatation ([place]) 08/07/2016  . Ear pain 07/22/2016  . Hyperlipemia 12/02/2015  . H/O splenectomy 12/02/2015  . Hyperglycemia 01/27/2014  . Obesity 01/27/2014  . Unspecified vitamin D deficiency 09/11/2012    Wayne Both 10/12/2016, 2:22 PM  [facility] [address] [place], [place], [address] Phone: [phone]   Fax:  [phone]  Name: Deanna [name] MRN: [id number] Date of Birth: Nov 13, 1960

## 2016-10-17 ENCOUNTER — Encounter: Payer: Self-pay | Admitting: Physical Therapy

## 2016-10-17 ENCOUNTER — Other Ambulatory Visit: Payer: Managed Care, Other (non HMO)

## 2016-10-17 ENCOUNTER — Ambulatory Visit: Payer: Managed Care, Other (non HMO) | Attending: Neurology | Admitting: Physical Therapy

## 2016-10-17 DIAGNOSIS — M25612 Stiffness of left shoulder, not elsewhere classified: Secondary | ICD-10-CM | POA: Diagnosis present

## 2016-10-17 DIAGNOSIS — M62838 Other muscle spasm: Secondary | ICD-10-CM | POA: Diagnosis present

## 2016-10-17 DIAGNOSIS — M542 Cervicalgia: Secondary | ICD-10-CM | POA: Diagnosis present

## 2016-10-17 LAB — HEMOCCULT SLIDES (X 3 CARDS)
OCCULT 1: NEGATIVE
OCCULT 2: NEGATIVE
OCCULT 3: NEGATIVE
OCCULT 4: NEGATIVE
OCCULT 5: NEGATIVE
OCCULT 6: NEGATIVE

## 2016-10-17 NOTE — Therapy (Signed)
[facility] [address], [place], [place], [address] Phone: [phone]   Fax:  [phone]  Physical Therapy Treatment  Patient Details  Name: Deanna [name] MRN: [id number] Date of Birth: [birth year] Referring Provider: Cameron Sprang  Encounter Date: 10/17/2016      PT End of Session - 10/17/16 1059    Visit Number 2   Date for PT Re-Evaluation 12/07/16   PT Start Time 1025  patient came late   PT Stop Time 1100   PT Time Calculation (min) 35 min   Activity Tolerance Patient tolerated treatment well   Behavior During Therapy [facility] for tasks assessed/performed      Past Medical History:  Diagnosis Date  . Allergy   . GERD (gastroesophageal reflux disease)    "does not need meds anymore"  . Heart murmur   . Hyperlipidemia   . Lactose intolerance   . 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HCC)     Past Surgical History:  Procedure Laterality Date  . ABDOMINAL HYSTERECTOMY  2006   benign tumor, complete  . KNEE ARTHROSCOPY Right   . NASAL SEPTUM SURGERY    . 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      Subjective Assessment - 10/17/16 1028    Subjective Last session has helped alot. I prefer the massage over the dry needling.    Patient Stated Goals get rid,    Currently in Pain? Yes   Pain Score 6    Pain Location Neck   Pain Orientation Right;Posterior   Pain Descriptors / Indicators Aching   Pain Type Acute pain   Pain Onset More than a month ago   Pain Frequency Intermittent   Aggravating Factors  sitting   Pain Relieving Factors walking around, neck pillow alleve   Multiple Pain Sites No                         [facility] Adult PT Treatment/Exercise - 10/17/16 0001      Manual Therapy   Manual Therapy Soft tissue mobilization;Joint mobilization;Manual Traction   Joint Mobilization PA mobilization to T1-T5 grade 3,  sideglide to C3-C7   Soft tissue mobilization bil. cervical paraspinals, levator scapulae, rhomboids, thoracic paraspainals,    Manual Traction to cervical x 65mn                PT Education - 10/17/16 1056    Education provided Yes   Education Details neural tension stretch on left, scapula retraction   Person(s) Educated Patient   Methods Explanation;Demonstration;Verbal cues;Handout   Comprehension Returned demonstration;Verbalized understanding          PT Short Term Goals - 10/17/16 1101      PT SHORT TERM GOAL #1   Title be independent in initial HEP   Time 4   Period Weeks   Status On-going     PT SHORT TERM GOAL #2   Title report 30% reduction in cervical pain and stiffness after sitting long periods for work   Time 4   Period Weeks   Status On-going     PT SHORT TERM GOAL #3   Title increased cervical flexion to 40 degrees or more due to increased soft tissue length   Time 4   Period Weeks   Status On-going           PT Long Term Goals -  10/12/16 1414      PT LONG TERM GOAL #1   Title be independent in advanced HEP   Time 8   Period Weeks   Status New     PT LONG TERM GOAL #2   Title reduce FOTO to < or = to 26% limitation   Time 8   Period Weeks   Status New     PT LONG TERM GOAL #3   Title demonstrate correct posture and body mechanics for ADLs and home tasks to reduce cervical stress   Time 8   Period Weeks   Status New     PT LONG TERM GOAL #4   Title report a 75% reduction in cervical pain with sitting long periods   Time 8   Period Weeks   Status New     PT LONG TERM GOAL #5   Title .Marland KitchenMarland Kitchen               Plan - 10/17/16 1059    Clinical Impression Statement Patient has not met goals due to just starting therapy.  After treatment pain level decreased to 0/10.  Patient has increased tightness in thoracic and cervical spine.  Patient does not want dry needling, prefers soft tissue work. Patient will benefit from skilled  therapy to reduce pain and improve mobtility so she is able to take care of the mother.    Rehab Potential Excellent   PT Frequency 2x / week   PT Duration 8 weeks   PT Treatment/Interventions ADLs/Self Care Home Management;Biofeedback;Cryotherapy;Electrical Stimulation;Iontophoresis 63m/ml Dexamethasone;Moist Heat;Traction;Ultrasound;Gait training;Stair training;Functional mobility training;Therapeutic activities;Therapeutic exercise;Balance training;Neuromuscular re-education;Patient/family education;Manual techniques;Passive range of motion;Dry needling;Taping   PT Next Visit Plan manual and modalities as needed for soft tissue lengthening, postural strengthening, review posture and follow up with addition of lumbar support in chair   PT Home Exercise Plan progress as needed   Recommended Other Services MD signed cert on 56/73/4193  Consulted and Agree with Plan of Care Patient      Patient will benefit from skilled therapeutic intervention in order to improve the following deficits and impairments:  Pain, Postural dysfunction, Increased muscle spasms, Decreased range of motion  Visit Diagnosis: Cervicalgia  Stiffness of left shoulder, not elsewhere classified  Other muscle spasm     Problem List Patient Active Problem List   Diagnosis Date Noted  . Abnormal brain MRI 09/25/2016  . Numbness and tingling in left hand 09/25/2016  . Cervical radiculopathy at C5 09/25/2016  . Neck pain 09/25/2016  . 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 [place] 08/07/2016  . Ear pain 07/22/2016  . Hyperlipemia 12/02/2015  . H/O splenectomy 12/02/2015  . Hyperglycemia 01/27/2014  . Obesity 01/27/2014  . Unspecified vitamin D deficiency 09/11/2012   CEarlie Counts PT 10/17/16 11:03 AM   10/17/2016, 11:03 AM  [facility] [address] [place] [address]: [phone]  Fax:  [phone]:  Deanna ATTWOODMRN: [id number] of Birth: [birth year]

## 2016-10-17 NOTE — Patient Instructions (Addendum)
Upper Limb Neural Tension: Median I    Stand with left palm flat on wall, fingers up. Bend elbow _30___ degrees and Hold _0___ seconds. Straighten elbow and Hold __2__ seconds. Repeat __5__ times per set. Do _1___ sets per session. Do _2___ sessions per day.  http://orth.exer.us/402   Copyright  VHI. All rights reserved.  Upper Limb Neural Tension: Ulnar III    Bend right elbow and position fingers around eye, hand upside down. Pull elbow backward. Hold __5__ seconds. Repeat __3__ times per set. Do ___1_ sets per session. Do __2__ sessions per day.  http://orth.exer.us/418   Copyright  VHI. All rights reserved.  Trigger Point Dry Needling  . What is Trigger Point Dry Needling (DN)? o DN is a physical therapy technique used to treat muscle pain and dysfunction. Specifically, DN helps deactivate muscle trigger points (muscle knots).  o A thin filiform needle is used to penetrate the skin and stimulate the underlying trigger point. The goal is for a local twitch response (LTR) to occur and for the trigger point to relax. No medication of any kind is injected during the procedure.   . What Does Trigger Point Dry Needling Feel Like?  o The procedure feels different for each individual patient. Some patients report that they do not actually feel the needle enter the skin and overall the process is not painful. Very mild bleeding may occur. However, many patients feel a deep cramping in the muscle in which the needle was inserted. This is the local twitch response.   Marland Kitchen How Will I feel after the treatment? o Soreness is normal, and the onset of soreness may not occur for a few hours. Typically this soreness does not last longer than two days.  o Bruising is uncommon, however; ice can be used to decrease any possible bruising.  o In rare cases feeling tired or nauseous after the treatment is normal. In addition, your symptoms may get worse before they get better, this period will typically  not last longer than 24 hours.   . What Can I do After My Treatment? o Increase your hydration by drinking more water for the next 24 hours. o You may place ice or heat on the areas treated that have become sore, however, do not use heat on inflamed or bruised areas. Heat often brings more relief post needling. o You can continue your regular activities, but vigorous activity is not recommended initially after the treatment for 24 hours. o DN is best combined with other physical therapy such as strengthening, stretching, and other therapies.    [facility] [address] [place], [place] [address] Phone # [phone] Fax [phone]  Scapular Retraction (Prone)    Lie with arms at sides with palms facing downward. Pinch shoulder blades together and raise arms a few inches from floor. Repeat __10__ times per set. Do _1___ sets per session. Do __1__ sessions per day.  http://orth.exer.us/955   Copyright  VHI. All rights reserved.

## 2016-10-18 ENCOUNTER — Other Ambulatory Visit (INDEPENDENT_AMBULATORY_CARE_PROVIDER_SITE_OTHER): Payer: Managed Care, Other (non HMO)

## 2016-10-18 DIAGNOSIS — D649 Anemia, unspecified: Secondary | ICD-10-CM | POA: Diagnosis not present

## 2016-10-18 DIAGNOSIS — R899 Unspecified abnormal finding in specimens from other organs, systems and tissues: Secondary | ICD-10-CM

## 2016-10-18 LAB — CBC WITH DIFFERENTIAL/PLATELET
Basophils Absolute: 0.1 10*3/uL (ref 0.0–0.1)
Basophils Relative: 1.1 % (ref 0.0–3.0)
Eosinophils Absolute: 0.1 10*3/uL (ref 0.0–0.7)
Eosinophils Relative: 1.6 % (ref 0.0–5.0)
HCT: 34.9 % — ABNORMAL LOW (ref 36.0–46.0)
Hemoglobin: 11.7 g/dL — ABNORMAL LOW (ref 12.0–15.0)
Lymphocytes Relative: 27.6 % (ref 12.0–46.0)
Lymphs Abs: 1.9 10*3/uL (ref 0.7–4.0)
MCHC: 33.6 g/dL (ref 30.0–36.0)
MCV: 86.1 fl (ref 78.0–100.0)
Monocytes Absolute: 0.8 10*3/uL (ref 0.1–1.0)
Monocytes Relative: 11.7 % (ref 3.0–12.0)
Neutro Abs: 4 10*3/uL (ref 1.4–7.7)
Neutrophils Relative %: 58 % (ref 43.0–77.0)
Platelets: 434 10*3/uL — ABNORMAL HIGH (ref 150.0–400.0)
RBC: 4.05 Mil/uL (ref 3.87–5.11)
RDW: 14.2 % (ref 11.5–15.5)
WBC: 6.9 10*3/uL (ref 4.0–10.5)

## 2016-10-20 ENCOUNTER — Encounter: Payer: Self-pay | Admitting: Physical Therapy

## 2016-10-20 ENCOUNTER — Ambulatory Visit: Payer: Managed Care, Other (non HMO) | Admitting: Physical Therapy

## 2016-10-20 DIAGNOSIS — M25612 Stiffness of left shoulder, not elsewhere classified: Secondary | ICD-10-CM

## 2016-10-20 DIAGNOSIS — M542 Cervicalgia: Secondary | ICD-10-CM

## 2016-10-20 DIAGNOSIS — M62838 Other muscle spasm: Secondary | ICD-10-CM

## 2016-10-20 NOTE — Therapy (Signed)
[facility] [address], [place], [place], [address] Phone: [phone]   Fax:  [phone]  Physical Therapy Treatment  Patient Details  Name: Deanna [name] MRN: [id number] Date of Birth: 04-21-1961 Referring Provider: Cameron Sprang  Encounter Date: 10/20/2016      PT End of Session - 10/20/16 1103    Visit Number 3   Date for PT Re-Evaluation 12/07/16   PT Start Time 1100   PT Stop Time 1145   PT Time Calculation (min) 45 min   Activity Tolerance Patient tolerated treatment well   Behavior During Therapy [facility] for tasks assessed/performed      Past Medical History:  Diagnosis Date  . Allergy   . GERD (gastroesophageal reflux disease)    "does not need meds anymore"  . Heart murmur   . Hyperlipidemia   . Lactose intolerance   . 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HCC)     Past Surgical History:  Procedure Laterality Date  . ABDOMINAL HYSTERECTOMY  2006   benign tumor, complete  . KNEE ARTHROSCOPY Right   . NASAL SEPTUM SURGERY    . 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      Subjective Assessment - 10/20/16 1105    Subjective I felt good from last visit.    Limitations Sitting   Patient Stated Goals get rid,    Currently in Pain? Yes   Pain Location Neck   Pain Orientation Right;Posterior   Pain Descriptors / Indicators Aching   Pain Type Acute pain   Pain Radiating Towards no pain down left arm   Pain Onset More than a month ago   Pain Frequency Intermittent   Aggravating Factors  sitting, fixing hair   Pain Relieving Factors walking around, neck pillow alleve                         [facility] Adult PT Treatment/Exercise - 10/20/16 0001      Neck Exercises: Standing   Other Standing Exercises face wall alternate shoulder flexion pressing palm into wall; bil. shoulder abduction facing wall and pressing palm into  wall  therapist assisted in guiding the scapula     Neck Exercises: Prone   Other Prone Exercise prone on elbows with chin tuck and tactile cues to perform correctly; prone on elbows alternate shoulder extension with verbal cues to not let shoulder drop     Shoulder Exercises: ROM/Strengthening   Other ROM/Strengthening Exercises arm pulleys 2 min for flexion and abduction     Manual Therapy   Manual Therapy Soft tissue mobilization   Joint Mobilization p-a mobilization to T1-T3; sideglide to C5-C7   Soft tissue mobilization bil. cervical paraspinals, subocciptials, upper trap, upper thoracic paraspinals,                 PT Education - 10/20/16 1142    Education provided Yes   Education Details correct bodymechanics with sitting and daily activities   Person(s) Educated Patient   Methods Explanation;Demonstration;Handout   Comprehension Verbalized understanding;Returned demonstration          PT Short Term Goals - 10/17/16 1101      PT SHORT TERM GOAL #1   Title be independent in initial HEP   Time 4   Period Weeks   Status On-going     PT SHORT TERM GOAL #2   Title report  30% reduction in cervical pain and stiffness after sitting long periods for work   Time 4   Period Weeks   Status On-going     PT SHORT TERM GOAL #3   Title increased cervical flexion to 40 degrees or more due to increased soft tissue length   Time 4   Period Weeks   Status On-going           PT Long Term Goals - 10/12/16 1414      PT LONG TERM GOAL #1   Title be independent in advanced HEP   Time 8   Period Weeks   Status New     PT LONG TERM GOAL #2   Title reduce FOTO to < or = to 26% limitation   Time 8   Period Weeks   Status New     PT LONG TERM GOAL #3   Title demonstrate correct posture and body mechanics for ADLs and home tasks to reduce cervical stress   Time 8   Period Weeks   Status New     PT LONG TERM GOAL #4   Title report a 75% reduction in cervical  pain with sitting long periods   Time 8   Period Weeks   Status New     PT LONG TERM GOAL #5   Title .Marland KitchenMarland Kitchen               Plan - 10/20/16 1104    Clinical Impression Statement After therapy patient has full cervical ROM.  Patient reports her pain has decreased by 80% since initial evaluation.  Patient will benefit from skilled therapy to reduce pain and improve mobility.    Rehab Potential Excellent   PT Frequency 2x / week   PT Duration 8 weeks   PT Treatment/Interventions ADLs/Self Care Home Management;Biofeedback;Cryotherapy;Electrical Stimulation;Iontophoresis 4mg /ml Dexamethasone;Moist Heat;Traction;Ultrasound;Gait training;Stair training;Functional mobility training;Therapeutic activities;Therapeutic exercise;Balance training;Neuromuscular re-education;Patient/family education;Manual techniques;Passive range of motion;Dry needling;Taping   PT Next Visit Plan shoulder and postural muscle strengthening with overhead activities; soft tissue work   PT Home Exercise Plan progress as needed   Consulted and Agree with Plan of Care Patient      Patient will benefit from skilled therapeutic intervention in order to improve the following deficits and impairments:  Pain, Postural dysfunction, Increased muscle spasms, Decreased range of motion  Visit Diagnosis: Cervicalgia  Stiffness of left shoulder, not elsewhere classified  Other muscle spasm     Problem List Patient Active Problem List   Diagnosis Date Noted  . Abnormal brain MRI 09/25/2016  . Numbness and tingling in left hand 09/25/2016  . Cervical radiculopathy at C5 09/25/2016  . Neck pain 09/25/2016  . 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 Aortic root dilatation ([place]) 08/07/2016  . Ear pain 07/22/2016  . Hyperlipemia 12/02/2015  . H/O splenectomy 12/02/2015  . Hyperglycemia 01/27/2014  . Obesity 01/27/2014  . Unspecified vitamin D deficiency 09/11/2012    Earlie Counts, PT 10/20/16 11:44 AM   [facility] [address], [place], [place], [address] Phone: [phone]   Fax:  [phone]  Name: Deanna [name] MRN: [id number] Date of Birth: 08/17/1960

## 2016-10-20 NOTE — Patient Instructions (Addendum)
Posture - Sitting    Sit upright, head facing forward. Try using a roll to support lower back. Keep shoulders relaxed, and avoid rounded back. Keep hips level with knees. Avoid crossing legs for long periods.   Copyright  VHI. All rights reserved.  Sleeping on Side    Place pillow between knees and feet. Use cervical support under neck and a roll around waist as needed. Roll a towel under your neck.    Copyright  VHI. All rights reserved.  Reading    When reading, hold material in tilted position and maintain good sitting posture.   Copyright  VHI. All rights reserved.  Eating    Sit, protecting natural arch in low back. Bring food to mouth, not mouth to food. Do not lean on elbows or arms.  Copyright  VHI. All rights reserved.  Pocketbooks and Shoulder Bags    Avoid heavy shoulder bags. If you use a shoulder bag, keep it light and alternate shoulders to carry it. Try placing the strap across body. Consider using a waist pack or smaller purse. Carry a waist pack in the back, except when it seems safer to have the pack in front.   Copyright  VHI. All rights reserved.  Positioning on Side in Bed    Place pillow, long enough to support knees and feet, between legs. Pillow under head and/or neck to protect alignment of this area. Place pillow in front for support of upper arm. Consider use of a body pillow. Avoid "fetal" position.  Copyright  VHI. All rights reserved.   [place] [address] [place], [place] [address] Phone # [phone] Fax [phone]

## 2016-10-23 ENCOUNTER — Encounter: Payer: Self-pay | Admitting: Physical Therapy

## 2016-10-23 ENCOUNTER — Ambulatory Visit: Payer: Managed Care, Other (non HMO) | Admitting: Physical Therapy

## 2016-10-23 DIAGNOSIS — M542 Cervicalgia: Secondary | ICD-10-CM

## 2016-10-23 DIAGNOSIS — M25612 Stiffness of left shoulder, not elsewhere classified: Secondary | ICD-10-CM

## 2016-10-23 DIAGNOSIS — M62838 Other muscle spasm: Secondary | ICD-10-CM

## 2016-10-23 NOTE — Patient Instructions (Addendum)
EXTENSION: Standing - Resistance Band: Stable (Active)   Stand, right arm at side. Against yellow resistance band, draw arm backward, as far as possible, keeping elbow straight. Complete ___ sets of ___ repetitions. Perform ___ sessions per day.  Copyright  VHI. All rights reserved.  Row: Mid-Range - Standing   With yellow band anchored at chest level, pull elbows backward, squeezing shoulder blades together. Keep head and spine neutral. Row ___ times, ___ times per day.  http://ss.exer.us/290   Copyright  VHI. All rights reserved.  Resistive Band Rowing   With resistive band anchored in door, grasp both ends. Keeping elbows bent, pull back, squeezing shoulder blades together. Hold _2___ seconds. Repeat _20___ times. Do __1__ sessions per day.  http://gt2.exer.us/97   Copyright  VHI. All rights reserved.   PNF Strengthening: Resisted   Standing with resistive band around each hand, bring right arm up and away, thumb back. Repeat _10___ times per set. Do _2___ sets per session. Do _1-2___ sessions per day.  Laying on back    Resisted Horizontal Abduction: Bilateral   Sit or stand, tubing in both hands, arms out in front. Keeping arms straight, pinch shoulder blades together and stretch arms out. Repeat _10___ times per set. Do 2____ sets per session. Do _1-2___ sessions per day.   Laying on back.                    Over Head Pull: Wide Grip    On back, knees bent, feet flat, band across thighs, elbows straight but relaxed. Pull hands apart (start). Keeping elbows straight, bring arms up and over head, hands toward floor. Keep steady pull on band. Hold momentarily. Return slowly, keeping pull steady, back to start. Repeat 10___ times. Band color ___red___  Y movement  Copyright  VHI. All rights reserved.  [place] [address], [place], [place] [address] Phone # [phone] Fax [phone]

## 2016-10-23 NOTE — Therapy (Signed)
[facility] [address] [place], [place], [address] Phone: [phone]   Fax:  [phone]  Physical Therapy Treatment  Patient Details  Name: Deanna [name] MRN: [id number] Date of Birth: 27-Feb-1961 Referring Provider: Cameron Sprang  Encounter Date: 10/23/2016      PT End of Session - 10/23/16 1251    Visit Number 4   Date for PT Re-Evaluation 12/07/16   PT Start Time 1230   PT Stop Time 1308   PT Time Calculation (min) 38 min   Activity Tolerance Patient tolerated treatment well   Behavior During Therapy [facility] for tasks assessed/performed      Past Medical History:  Diagnosis Date  . Allergy   . GERD (gastroesophageal reflux disease)    "does not need meds anymore"  . Heart murmur   . Hyperlipidemia   . Lactose intolerance   . 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HCC)     Past Surgical History:  Procedure Laterality Date  . ABDOMINAL HYSTERECTOMY  2006   benign tumor, complete  . KNEE ARTHROSCOPY Right   . NASAL SEPTUM SURGERY    . 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      Subjective Assessment - 10/23/16 1234    Subjective No pain.  I know the pain is there and it is not severe pain. FIxing my hair is easier.    Limitations Sitting   Patient Stated Goals get rid,    Currently in Pain? No/denies                         [facility] Adult PT Treatment/Exercise - 10/23/16 0001      Neck Exercises: Standing   Other Standing Exercises face wall alternate shoulder flexion pressing palm into wall; bil. shoulder abduction facing wall and pressing palm into wall  therapist assisted in guiding the scapula     Neck Exercises: Supine   Other Supine Exercise horizontal abduction with redband 10x,  y movement 10x;  diagonals with redband 10x bothways     Shoulder Exercises: ROM/Strengthening   UBE (Upper Arm Bike) 2 min forward/2  min back ward level 1     Manual Therapy   Manual Therapy Soft tissue mobilization   Soft tissue mobilization bil. cervical paraspinals, subocciptials, upper trap, upper thoracic paraspinals,                 PT Education - 10/23/16 1250    Education provided Yes   Education Details interscapular strengthening   Person(s) Educated Patient   Methods Explanation;Demonstration;Verbal cues;Handout   Comprehension Verbalized understanding;Returned demonstration          PT Short Term Goals - 10/23/16 1235      PT SHORT TERM GOAL #1   Title be independent in initial HEP   Time 4   Period Weeks   Status Achieved     PT SHORT TERM GOAL #2   Title report 30% reduction in cervical pain and stiffness after sitting long periods for work   Time 4   Period Weeks   Status Achieved     PT SHORT TERM GOAL #3   Title increased cervical flexion to 40 degrees or more due to increased soft tissue length   Time 4   Period Weeks   Status Achieved           PT Long Term  Goals - 10/12/16 1414      PT LONG TERM GOAL #1   Title be independent in advanced HEP   Time 8   Period Weeks   Status New     PT LONG TERM GOAL #2   Title reduce FOTO to < or = to 26% limitation   Time 8   Period Weeks   Status New     PT LONG TERM GOAL #3   Title demonstrate correct posture and body mechanics for ADLs and home tasks to reduce cervical stress   Time 8   Period Weeks   Status New     PT LONG TERM GOAL #4   Title report a 75% reduction in cervical pain with sitting long periods   Time 8   Period Weeks   Status New     PT LONG TERM GOAL #5   Title .Marland KitchenMarland Kitchen               Plan - 10/23/16 1315    Clinical Impression Statement Patient came in without pain.  Patient is working on postural muscle exercises. Patient has less muscle spasms. Patient has a trigger point on T3 multifidi and C3 multifidi. Patient has met her sTG's.  Patient will benefit from skilled therapy to reduce  pain and improve mobility.    Rehab Potential Excellent   PT Frequency 2x / week   PT Duration 8 weeks   PT Treatment/Interventions ADLs/Self Care Home Management;Biofeedback;Cryotherapy;Electrical Stimulation;Iontophoresis 87m/ml Dexamethasone;Moist Heat;Traction;Ultrasound;Gait training;Stair training;Functional mobility training;Therapeutic activities;Therapeutic exercise;Balance training;Neuromuscular re-education;Patient/family education;Manual techniques;Passive range of motion;Dry needling;Taping   PT Next Visit Plan shoulder and postural muscle strengthening with overhead activities; soft tissue work; no dry needling; upper thoracic extension exercises   PT Home Exercise Plan progress as needed   Consulted and Agree with Plan of Care Patient      Patient will benefit from skilled therapeutic intervention in order to improve the following deficits and impairments:  Pain, Postural dysfunction, Increased muscle spasms, Decreased range of motion  Visit Diagnosis: Cervicalgia  Stiffness of left shoulder, not elsewhere classified  Other muscle spasm     Problem List Patient Active Problem List   Diagnosis Date Noted  . Abnormal brain MRI 09/25/2016  . Numbness and tingling in left hand 09/25/2016  . Cervical radiculopathy at C5 09/25/2016  . Neck pain 09/25/2016  . 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 Aortic root dilatation ([place] 08/07/2016  . Ear pain 07/22/2016  . Hyperlipemia 12/02/2015  . H/O splenectomy 12/02/2015  . Hyperglycemia 01/27/2014  . Obesity 01/27/2014  . Unspecified vitamin D deficiency 09/11/2012    CEarlie Counts PT 10/23/16 1:18 PM   [facility] [address] [place] [address]: [phone]  Fax:  [phone] Name: Deanna MUSILMRN: [id number] of Birth: 911-29-1962

## 2016-10-25 ENCOUNTER — Encounter: Payer: Self-pay | Admitting: Physical Therapy

## 2016-10-25 ENCOUNTER — Ambulatory Visit: Payer: Managed Care, Other (non HMO) | Admitting: Physical Therapy

## 2016-10-25 DIAGNOSIS — M542 Cervicalgia: Secondary | ICD-10-CM

## 2016-10-25 DIAGNOSIS — M62838 Other muscle spasm: Secondary | ICD-10-CM

## 2016-10-25 DIAGNOSIS — M25612 Stiffness of left shoulder, not elsewhere classified: Secondary | ICD-10-CM

## 2016-10-25 NOTE — Therapy (Signed)
[facility] [address] [place], [place], [address] Phone: [phone]   Fax:  [phone]  Physical Therapy Treatment  Patient Details  Name: Deanna [name] MRN: [id number] Date of Birth: [birth year] Referring Provider: Cameron Sprang  Encounter Date: 10/25/2016      PT End of Session - 10/25/16 1146    Visit Number 5   Date for PT Re-Evaluation 12/07/16   PT Start Time 1146  Pt was feeling good and we ended early   PT Stop Time 1215   PT Time Calculation (min) 29 min   Activity Tolerance Patient tolerated treatment well   Behavior During Therapy [facility] for tasks assessed/performed      Past Medical History:  Diagnosis Date  . Allergy   . GERD (gastroesophageal reflux disease)    "does not need meds anymore"  . Heart murmur   . Hyperlipidemia   . Lactose intolerance   . 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HCC)     Past Surgical History:  Procedure Laterality Date  . ABDOMINAL HYSTERECTOMY  2006   benign tumor, complete  . KNEE ARTHROSCOPY Right   . NASAL SEPTUM SURGERY    . 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      Subjective Assessment - 10/25/16 1151    Subjective My muscle tightened up after last session but then the next day it was good.    Currently in Pain? No/denies   Multiple Pain Sites No                         [facility] Adult PT Treatment/Exercise - 10/25/16 0001      Neck Exercises: Standing   Other Standing Exercises face wall alternate shoulder flexion pressing palm into wall; bil. shoulder abduction facing wall and pressing palm into wall  therapist assisted in guiding the scapula     Neck Exercises: Seated   Other Seated Exercise Theracane trigger point release bil upper traps with side bend stretch      Neck Exercises: Supine   Other Supine Exercise horizontal abduction with redband 10x2,  y movement  10x2;  diagonals with redband 10x2 bothways     Shoulder Exercises: Pulleys   Flexion 2 minutes   ABduction 2 minutes     Shoulder Exercises: ROM/Strengthening   UBE (Upper Arm Bike) 2 min forward/2 min back ward level 1     Manual Therapy   Manual Therapy Soft tissue mobilization   Soft tissue mobilization upper traps: no trigger points                  PT Short Term Goals - 10/23/16 1235      PT SHORT TERM GOAL #1   Title be independent in initial HEP   Time 4   Period Weeks   Status Achieved     PT SHORT TERM GOAL #2   Title report 30% reduction in cervical pain and stiffness after sitting long periods for work   Time 4   Period Weeks   Status Achieved     PT SHORT TERM GOAL #3   Title increased cervical flexion to 40 degrees or more due to increased soft tissue length   Time 4   Period Weeks   Status Achieved           PT Long Term Goals - 10/12/16 1414  PT LONG TERM GOAL #1   Title be independent in advanced HEP   Time 8   Period Weeks   Status New     PT LONG TERM GOAL #2   Title reduce FOTO to < or = to 26% limitation   Time 8   Period Weeks   Status New     PT LONG TERM GOAL #3   Title demonstrate correct posture and body mechanics for ADLs and home tasks to reduce cervical stress   Time 8   Period Weeks   Status New     PT LONG TERM GOAL #4   Title report a 75% reduction in cervical pain with sitting long periods   Time 8   Period Weeks   Status New     PT LONG TERM GOAL #5   Title .Marland KitchenMarland Kitchen               Plan - 10/25/16 1152    Clinical Impression Statement Pt presented today without any pain and reports of feeling good. Pt had no active triger points or any at all whe nupper traps were palpated.  Increased sets on supine exercises, RTUE had visable shaking more than likely due to fatigue.    Rehab Potential Excellent   PT Frequency 2x / week   PT Duration 8 weeks   PT Treatment/Interventions ADLs/Self Care Home  Management;Biofeedback;Cryotherapy;Electrical Stimulation;Iontophoresis 4mg /ml Dexamethasone;Moist Heat;Traction;Ultrasound;Gait training;Stair training;Functional mobility training;Therapeutic activities;Therapeutic exercise;Balance training;Neuromuscular re-education;Patient/family education;Manual techniques;Passive range of motion;Dry needling;Taping   PT Next Visit Plan shoulder and postural muscle strengthening with overhead activities; upper thoracic extension exercises   PT Home Exercise Plan progress as needed   Consulted and Agree with Plan of Care --      Patient will benefit from skilled therapeutic intervention in order to improve the following deficits and impairments:  Pain, Postural dysfunction, Increased muscle spasms, Decreased range of motion  Visit Diagnosis: Cervicalgia  Stiffness of left shoulder, not elsewhere classified  Other muscle spasm     Problem List Patient Active Problem List   Diagnosis Date Noted  . Abnormal brain MRI 09/25/2016  . Numbness and tingling in left hand 09/25/2016  . Cervical radiculopathy at C5 09/25/2016  . Neck pain 09/25/2016  . 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 Aortic root dilatation ([place]) 08/07/2016  . Ear pain 07/22/2016  . Hyperlipemia 12/02/2015  . H/O splenectomy 12/02/2015  . Hyperglycemia 01/27/2014  . Obesity 01/27/2014  . Unspecified vitamin D deficiency 09/11/2012    Derrian Rodak, PTA 10/25/2016, 12:23 PM  [facility] [address], [place], [place], [address] Phone: [phone]   Fax:  [phone]  Name: IVIANA BLASINGAME MRN: [id number] Date of Birth: 1960-06-10

## 2016-10-30 ENCOUNTER — Ambulatory Visit: Payer: Managed Care, Other (non HMO) | Admitting: Physical Therapy

## 2016-10-30 ENCOUNTER — Encounter: Payer: Self-pay | Admitting: Physical Therapy

## 2016-10-30 DIAGNOSIS — M542 Cervicalgia: Secondary | ICD-10-CM

## 2016-10-30 DIAGNOSIS — M62838 Other muscle spasm: Secondary | ICD-10-CM

## 2016-10-30 DIAGNOSIS — M25612 Stiffness of left shoulder, not elsewhere classified: Secondary | ICD-10-CM

## 2016-10-30 NOTE — Therapy (Signed)
[facility] [address] [place], [place], [address] Phone: [phone]   Fax:  [phone]  Physical Therapy Treatment  Patient Details  Name: Deanna [name] MRN: [id number] Date of Birth: June 02, 1960 Referring Provider: Cameron Sprang  Encounter Date: 10/30/2016      PT End of Session - 10/30/16 1056    Visit Number 6   Date for PT Re-Evaluation 12/07/16   PT Start Time 1055   PT Stop Time 1136   PT Time Calculation (min) 41 min   Activity Tolerance Patient tolerated treatment well   Behavior During Therapy [facility] for tasks assessed/performed      Past Medical History:  Diagnosis Date  . Allergy   . GERD (gastroesophageal reflux disease)    "does not need meds anymore"  . Heart murmur   . Hyperlipidemia   . Lactose intolerance   . 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HCC)     Past Surgical History:  Procedure Laterality Date  . ABDOMINAL HYSTERECTOMY  2006   benign tumor, complete  . KNEE ARTHROSCOPY Right   . NASAL SEPTUM SURGERY    . 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      Subjective Assessment - 10/30/16 1057    Subjective I am 97% improved. Doing her walks consistently. Night time has been difficult getting a comfortable position for my neck.    Limitations Sitting   Patient Stated Goals get rid,    Currently in Pain? No/denies   Multiple Pain Sites No                         [facility] Adult PT Treatment/Exercise - 10/30/16 0001      Self-Care   Self-Care Posture   Posture Sleeping with pillows     Neck Exercises: Supine   Other Supine Exercise Decompression on foam roll x 2 min, shoulder flexion with cane 10x,   VC for core activation, then 6x snow angle arms     Shoulder Exercises: Prone   Other Prone Exercises Rows over blue ball 2x 15   VC for neck position   Other Prone Exercises Horizontal adduction 1#  2x 10     Shoulder Exercises: Pulleys   Flexion 3 minutes   ABduction 3 minutes     Shoulder Exercises: ROM/Strengthening   UBE (Upper Arm Bike) 2 min forward/2 min back ward level 1   Wall Pushups 20 reps   Other ROM/Strengthening Exercises Red band horizontal abd 2x 10 diagonals each side 10x    Other ROM/Strengthening Exercises 3 way raise 1# 10x                  PT Short Term Goals - 10/23/16 1235      PT SHORT TERM GOAL #1   Title be independent in initial HEP   Time 4   Period Weeks   Status Achieved     PT SHORT TERM GOAL #2   Title report 30% reduction in cervical pain and stiffness after sitting long periods for work   Time 4   Period Weeks   Status Achieved     PT SHORT TERM GOAL #3   Title increased cervical flexion to 40 degrees or more due to increased soft tissue length   Time 4   Period Weeks   Status Achieved  PT Long Term Goals - 10/30/16 1139      PT LONG TERM GOAL #1   Title be independent in advanced HEP   Time 8   Period Weeks   Status On-going     PT LONG TERM GOAL #3   Title demonstrate correct posture and body mechanics for ADLs and home tasks to reduce cervical stress   Time 8   Period Weeks   Status On-going     PT LONG TERM GOAL #4   Title report a 75% reduction in cervical pain with sitting long periods   Time 8   Period Weeks   Status Achieved  97% improved               Plan - 10/30/16 1056    Clinical Impression Statement Challenged pt with more upper extremity strengthening and shoulder release/ROM exercises on the foam roll. pt tolerated all exercises well with no pain reported.    Rehab Potential Excellent   PT Frequency 2x / week   PT Duration 8 weeks   PT Treatment/Interventions ADLs/Self Care Home Management;Biofeedback;Cryotherapy;Electrical Stimulation;Iontophoresis 4mg /ml Dexamethasone;Moist Heat;Traction;Ultrasound;Gait training;Stair training;Functional mobility training;Therapeutic  activities;Therapeutic exercise;Balance training;Neuromuscular re-education;Patient/family education;Manual techniques;Passive range of motion;Dry needling;Taping   PT Next Visit Plan shoulder and postural muscle strengthening with overhead activities; upper thoracic extension exercises   Consulted and Agree with Plan of Care Patient      Patient will benefit from skilled therapeutic intervention in order to improve the following deficits and impairments:  Pain, Postural dysfunction, Increased muscle spasms, Decreased range of motion  Visit Diagnosis: Cervicalgia  Stiffness of left shoulder, not elsewhere classified  Other muscle spasm     Problem List Patient Active Problem List   Diagnosis Date Noted  . Abnormal brain MRI 09/25/2016  . Numbness and tingling in left hand 09/25/2016  . Cervical radiculopathy at C5 09/25/2016  . Neck pain 09/25/2016  . 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 Aortic root dilatation ([place]) 08/07/2016  . Ear pain 07/22/2016  . Hyperlipemia 12/02/2015  . H/O splenectomy 12/02/2015  . Hyperglycemia 01/27/2014  . Obesity 01/27/2014  . Unspecified vitamin D deficiency 09/11/2012    COCHRAN,JENNIFER, PTA 10/30/2016, 11:41 AM  [facility] [address], [place], [place], [address] Phone: [phone]   Fax:  [phone]  Name: Deanna [name] MRN: [id number] Date of Birth: [birth year]

## 2016-11-01 ENCOUNTER — Ambulatory Visit: Payer: Managed Care, Other (non HMO) | Admitting: Physical Therapy

## 2016-11-01 ENCOUNTER — Encounter: Payer: Self-pay | Admitting: Physical Therapy

## 2016-11-01 DIAGNOSIS — M542 Cervicalgia: Secondary | ICD-10-CM | POA: Diagnosis not present

## 2016-11-01 DIAGNOSIS — M62838 Other muscle spasm: Secondary | ICD-10-CM

## 2016-11-01 DIAGNOSIS — M25612 Stiffness of left shoulder, not elsewhere classified: Secondary | ICD-10-CM

## 2016-11-01 NOTE — Therapy (Signed)
[facility] [address] [place], [place], [address] Phone: [phone]   Fax:  [phone]  Physical Therapy Treatment  Patient Details  Name: Deanna [name] MRN: [id number] Date of Birth: [birth year] Referring Provider: Cameron Sprang  Encounter Date: 11/01/2016      PT End of Session - 11/01/16 1100    Visit Number 7   Date for PT Re-Evaluation 12/07/16   PT Start Time 1100   PT Stop Time 1140   PT Time Calculation (min) 40 min   Activity Tolerance Patient tolerated treatment well   Behavior During Therapy [facility] for tasks assessed/performed      Past Medical History:  Diagnosis Date  . Allergy   . GERD (gastroesophageal reflux disease)    "does not need meds anymore"  . Heart murmur   . Hyperlipidemia   . Lactose intolerance   . 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HCC)     Past Surgical History:  Procedure Laterality Date  . ABDOMINAL HYSTERECTOMY  2006   benign tumor, complete  . KNEE ARTHROSCOPY Right   . NASAL SEPTUM SURGERY    . 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      Subjective Assessment - 11/01/16 1102    Subjective I could feel my muscles after last treatement. Felt good.    Currently in Pain? No/denies   Multiple Pain Sites No                         [facility] Adult PT Treatment/Exercise - 11/01/16 0001      Neck Exercises: Seated   Other Seated Exercise Theracane trigger point release bil upper traps with side bend stretch      Neck Exercises: Supine   Other Supine Exercise Decompression on foam roll x 2 min, shoulder flexion with cane 10x,   VC for core activation, then 6x snow angle arms, red band ex     Shoulder Exercises: Prone   Other Prone Exercises Rows over blue ball 2x 15   VC for neck position, 1#   Other Prone Exercises Horizontal adduction 1# 2x 10     Shoulder Exercises: Pulleys   Flexion 3 minutes   ABduction 3 minutes     Shoulder Exercises: ROM/Strengthening   UBE (Upper Arm Bike) 3 min forward/3 min back ward level 1   Wall Pushups --  2 x15                  PT Short Term Goals - 10/23/16 1235      PT SHORT TERM GOAL #1   Title be independent in initial HEP   Time 4   Period Weeks   Status Achieved     PT SHORT TERM GOAL #2   Title report 30% reduction in cervical pain and stiffness after sitting long periods for work   Time 4   Period Weeks   Status Achieved     PT SHORT TERM GOAL #3   Title increased cervical flexion to 40 degrees or more due to increased soft tissue length   Time 4   Period Weeks   Status Achieved           PT Long Term Goals - 11/01/16 1121      PT LONG TERM GOAL #3   Title demonstrate correct posture and body mechanics for ADLs and home tasks  to reduce cervical stress   Time 8   Period Weeks   Status Achieved               Plan - 11/01/16 1118    Clinical Impression Statement Pt feeling almost 100% improved at this point. She is tolerating all strengthening exercises without exacerbation and performing all ADLs including painting without muscles tightening up.    Rehab Potential Excellent   PT Frequency 2x / week   PT Duration 8 weeks   PT Treatment/Interventions ADLs/Self Care Home Management;Biofeedback;Cryotherapy;Electrical Stimulation;Iontophoresis 4mg /ml Dexamethasone;Moist Heat;Traction;Ultrasound;Gait training;Stair training;Functional mobility training;Therapeutic activities;Therapeutic exercise;Balance training;Neuromuscular re-education;Patient/family education;Manual techniques;Passive range of motion;Dry needling;Taping   PT Next Visit Plan Pt would like next week to be her last. Next visit put wheels into motion for DC   Consulted and Agree with Plan of Care Patient      Patient will benefit from skilled therapeutic intervention in order to improve the following deficits and  impairments:  Pain, Postural dysfunction, Increased muscle spasms, Decreased range of motion  Visit Diagnosis: Cervicalgia  Stiffness of left shoulder, not elsewhere classified  Other muscle spasm     Problem List Patient Active Problem List   Diagnosis Date Noted  . Abnormal brain MRI 09/25/2016  . Numbness and tingling in left hand 09/25/2016  . Cervical radiculopathy at C5 09/25/2016  . Neck pain 09/25/2016  . 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 Aortic root dilatation ([place]) 08/07/2016  . Ear pain 07/22/2016  . Hyperlipemia 12/02/2015  . H/O splenectomy 12/02/2015  . Hyperglycemia 01/27/2014  . Obesity 01/27/2014  . Unspecified vitamin D deficiency 09/11/2012    Yong Wahlquist, PTA 11/01/2016, 11:36 AM  [facility] [address] [place], [place], [address] Phone: [phone]   Fax:  [phone]  Name: CHRISANN MELARAGNO MRN: [id number] Date of Birth: [birth year]

## 2016-11-01 NOTE — Patient Instructions (Signed)
  http://ecce.exer.us/221   Copyright  VHI. All rights reserved.  Abduction: Horizontal - Prone (Dumbbell)    Lie with right arm hanging down. Lift arm out to side, thumb up. Repeat _10___ times per set. Do ___2_ sets per session. Do 1x day. Use 1-2____ lb weight.  #2 Rows: in the same position as above, bend your elbows by your side. Pull elbows back and forth like you are picking weeds. 2x10, 1-2# weights.  Try the above exercises on the corner of your bed. Drape your arms over the sides.    Copyright  VHI. All rights reserved.

## 2016-11-01 NOTE — Therapy (Signed)
[facility] [address], [place], [place], [address] Phone: [phone]   Fax:  [phone]  Physical Therapy Treatment  Patient Details  Name: Deanna [name] MRN: [id number] Date of Birth: 01/30/1961 Referring Provider: Cameron Sprang  Encounter Date: 11/01/2016      PT End of Session - 11/01/16 1100    Visit Number 7   Date for PT Re-Evaluation 12/07/16   PT Start Time 1100   PT Stop Time 1140   PT Time Calculation (min) 40 min   Activity Tolerance Patient tolerated treatment well   Behavior During Therapy [facility] for tasks assessed/performed      Past Medical History:  Diagnosis Date  . Allergy   . GERD (gastroesophageal reflux disease)    "does not need meds anymore"  . Heart murmur   . Hyperlipidemia   . Lactose intolerance   . 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HCC)     Past Surgical History:  Procedure Laterality Date  . ABDOMINAL HYSTERECTOMY  2006   benign tumor, complete  . KNEE ARTHROSCOPY Right   . NASAL SEPTUM SURGERY    . 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      Subjective Assessment - 11/01/16 1102    Subjective I could feel my muscles after last treatement. Felt good.    Currently in Pain? No/denies   Multiple Pain Sites No                         [facility] Adult PT Treatment/Exercise - 11/01/16 0001      Neck Exercises: Seated   Other Seated Exercise Theracane trigger point release bil upper traps with side bend stretch      Neck Exercises: Supine   Other Supine Exercise Decompression on foam roll x 2 min, shoulder flexion with cane 10x,   VC for core activation, then 6x snow angle arms, red band ex     Shoulder Exercises: Prone   Other Prone Exercises Rows over blue ball 2x 15   VC for neck position, 1#   Other Prone Exercises Horizontal adduction 1# 2x 10     Shoulder Exercises: Pulleys   Flexion 3 minutes   ABduction 3 minutes     Shoulder Exercises: ROM/Strengthening   UBE (Upper Arm Bike) 3 min forward/3 min back ward level 1   Wall Pushups --  2 x15                  PT Short Term Goals - 10/23/16 1235      PT SHORT TERM GOAL #1   Title be independent in initial HEP   Time 4   Period Weeks   Status Achieved     PT SHORT TERM GOAL #2   Title report 30% reduction in cervical pain and stiffness after sitting long periods for work   Time 4   Period Weeks   Status Achieved     PT SHORT TERM GOAL #3   Title increased cervical flexion to 40 degrees or more due to increased soft tissue length   Time 4   Period Weeks   Status Achieved           PT Long Term Goals - 11/01/16 1121      PT LONG TERM GOAL #3   Title demonstrate correct posture and body mechanics for ADLs and home tasks  to reduce cervical stress   Time 8   Period Weeks   Status Achieved               Plan - 11/01/16 1118    Clinical Impression Statement Pt feeling almost 100% improved at this point. She is tolerating all strengthening exercises without exacerbation and performing all ADLs including painting without muscles tightening up.    Rehab Potential Excellent   PT Frequency 2x / week   PT Duration 8 weeks   PT Treatment/Interventions ADLs/Self Care Home Management;Biofeedback;Cryotherapy;Electrical Stimulation;Iontophoresis 4mg /ml Dexamethasone;Moist Heat;Traction;Ultrasound;Gait training;Stair training;Functional mobility training;Therapeutic activities;Therapeutic exercise;Balance training;Neuromuscular re-education;Patient/family education;Manual techniques;Passive range of motion;Dry needling;Taping   PT Next Visit Plan Pt would like next week to be her last. Next visit put wheels into motion for DC   Consulted and Agree with Plan of Care Patient      Patient will benefit from skilled therapeutic intervention in order to improve the following deficits and  impairments:  Pain, Postural dysfunction, Increased muscle spasms, Decreased range of motion  Visit Diagnosis: Cervicalgia  Stiffness of left shoulder, not elsewhere classified  Other muscle spasm     Problem List Patient Active Problem List   Diagnosis Date Noted  . Abnormal brain MRI 09/25/2016  . Numbness and tingling in left hand 09/25/2016  . Cervical radiculopathy at C5 09/25/2016  . Neck pain 09/25/2016  . 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 Aortic root dilatation ([place]) 08/07/2016  . Ear pain 07/22/2016  . Hyperlipemia 12/02/2015  . H/O splenectomy 12/02/2015  . Hyperglycemia 01/27/2014  . Obesity 01/27/2014  . Unspecified vitamin D deficiency 09/11/2012    Deanna [name], PTA 11/01/2016, 11:45 AM  [facility] [address], [place], [place], [address] Phone: [phone]   Fax:  [phone]  Name: Deanna [name] MRN: [id number] Date of Birth: [birth year]

## 2016-11-06 ENCOUNTER — Encounter: Payer: Self-pay | Admitting: Physical Therapy

## 2016-11-06 ENCOUNTER — Ambulatory Visit: Payer: Managed Care, Other (non HMO) | Admitting: Physical Therapy

## 2016-11-06 DIAGNOSIS — M542 Cervicalgia: Secondary | ICD-10-CM

## 2016-11-06 DIAGNOSIS — M62838 Other muscle spasm: Secondary | ICD-10-CM

## 2016-11-06 DIAGNOSIS — M25612 Stiffness of left shoulder, not elsewhere classified: Secondary | ICD-10-CM

## 2016-11-06 NOTE — Therapy (Signed)
[facility] [address] [place], [place], [address] Phone: [phone]   Fax:  [phone]  Physical Therapy Treatment  Patient Details  Name: Deanna [name] MRN: [id number] Date of Birth: Sep 28, 1960 Referring Provider: Cameron Sprang  Encounter Date: 11/06/2016      PT End of Session - 11/06/16 1151    Visit Number 8   Date for PT Re-Evaluation 12/07/16   PT Start Time 1151   PT Stop Time 1230   PT Time Calculation (min) 39 min   Activity Tolerance Patient tolerated treatment well   Behavior During Therapy [facility] for tasks assessed/performed      Past Medical History:  Diagnosis Date  . Allergy   . GERD (gastroesophageal reflux disease)    "does not need meds anymore"  . Heart murmur   . Hyperlipidemia   . Lactose intolerance   . 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HCC)     Past Surgical History:  Procedure Laterality Date  . ABDOMINAL HYSTERECTOMY  2006   benign tumor, complete  . KNEE ARTHROSCOPY Right   . NASAL SEPTUM SURGERY    . 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      Subjective Assessment - 11/06/16 1153    Subjective The biggest issue is at night when I lay down it takes a while to get my neck comfortable.     Currently in Pain? No/denies                         [facility] Adult PT Treatment/Exercise - 11/06/16 0001      Self-Care   Posture self massage with tennis ball on upper traps and rhomboids     Neck Exercises: Supine   Other Supine Exercise Decompression on foam roll x 2 min, shoulder flexion with cane 10x,   VC for core activation, then 6x snow angle arms, red band ex     Shoulder Exercises: Prone   Other Prone Exercises Rows over blue ball x 15   VC for neck position, 2#   Other Prone Exercises Horizontal adduction 2# 15     Shoulder Exercises: Pulleys   Flexion 3 minutes     Shoulder  Exercises: ROM/Strengthening   UBE (Upper Arm Bike) 3 min forward/3 min back ward level 1   Wall Pushups --  2 x15   Other ROM/Strengthening Exercises Red band horizontal abd 2x 10 diagonals each side 10x      Manual Therapy   Manual Therapy Soft tissue mobilization   Soft tissue mobilization trigger points in right rhomboid; upper traps tight                  PT Short Term Goals - 10/23/16 1235      PT SHORT TERM GOAL #1   Title be independent in initial HEP   Time 4   Period Weeks   Status Achieved     PT SHORT TERM GOAL #2   Title report 30% reduction in cervical pain and stiffness after sitting long periods for work   Time 4   Period Weeks   Status Achieved     PT SHORT TERM GOAL #3   Title increased cervical flexion to 40 degrees or more due to increased soft tissue length   Time 4   Period Weeks   Status Achieved  PT Long Term Goals - 11/06/16 1156      PT LONG TERM GOAL #1   Title be independent in advanced HEP   Time 8   Period Weeks   Status On-going     PT LONG TERM GOAL #2   Title reduce FOTO to < or = to 26% limitation   Baseline 31% limited (11/06/16)   Time 8   Period Weeks   Status On-going     PT LONG TERM GOAL #3   Title demonstrate correct posture and body mechanics for ADLs and home tasks to reduce cervical stress   Time 8   Period Weeks   Status Achieved     PT LONG TERM GOAL #4   Title report a 75% reduction in cervical pain with sitting long periods   Time 8   Period Weeks   Status Achieved               Plan - 11/06/16 1154    Clinical Impression Statement Patient continues to have mild muscle spasms in rhomboids and upper trap.  She was educated in self massage. Pt doing well with all exercises and will benefit from final visit to review HEP to ensure she will successfully transistion.   Rehab Potential Excellent   PT Treatment/Interventions ADLs/Self Care Home  Management;Biofeedback;Cryotherapy;Electrical Stimulation;Iontophoresis 4mg /ml Dexamethasone;Moist Heat;Traction;Ultrasound;Gait training;Stair training;Functional mobility training;Therapeutic activities;Therapeutic exercise;Balance training;Neuromuscular re-education;Patient/family education;Manual techniques;Passive range of motion;Dry needling;Taping   PT Next Visit Plan D/c next visit, all goals are updated.   Consulted and Agree with Plan of Care Patient      Patient will benefit from skilled therapeutic intervention in order to improve the following deficits and impairments:  Pain, Postural dysfunction, Increased muscle spasms, Decreased range of motion  Visit Diagnosis: Cervicalgia  Stiffness of left shoulder, not elsewhere classified  Other muscle spasm     Problem List Patient Active Problem List   Diagnosis Date Noted  . Abnormal brain MRI 09/25/2016  . Numbness and tingling in left hand 09/25/2016  . Cervical radiculopathy at C5 09/25/2016  . Neck pain 09/25/2016  . 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 Aortic root dilatation ([place]) 08/07/2016  . Ear pain 07/22/2016  . Hyperlipemia 12/02/2015  . H/O splenectomy 12/02/2015  . Hyperglycemia 01/27/2014  . Obesity 01/27/2014  . Unspecified vitamin D deficiency 09/11/2012    Zannie Cove, PT 11/06/2016, 1:23 PM  [facility] [address], [place], [place], [address] Phone: [phone]   Fax:  [phone]  Name: Deanna [name] MRN: [id number] Date of Birth: [birth year]

## 2016-11-08 ENCOUNTER — Ambulatory Visit: Payer: Managed Care, Other (non HMO) | Admitting: Physical Therapy

## 2016-11-08 ENCOUNTER — Encounter: Payer: Self-pay | Admitting: Physical Therapy

## 2016-11-08 DIAGNOSIS — M62838 Other muscle spasm: Secondary | ICD-10-CM

## 2016-11-08 DIAGNOSIS — M542 Cervicalgia: Secondary | ICD-10-CM | POA: Diagnosis not present

## 2016-11-08 DIAGNOSIS — M25612 Stiffness of left shoulder, not elsewhere classified: Secondary | ICD-10-CM

## 2016-11-08 NOTE — Therapy (Addendum)
[facility] [address] [place], [place], [address] Phone: [phone]   Fax:  [phone]  Physical Therapy Treatment  Patient Details  Name: Deanna [name] MRN: [id number] Date of Birth: 13-Apr-1961 Referring Provider: Cameron Sprang  Encounter Date: 11/08/2016      PT End of Session - 11/08/16 1113    Visit Number 9   Date for PT Re-Evaluation 12/07/16   PT Start Time 1110  Pt 10 min late   PT Stop Time 1140   PT Time Calculation (min) 30 min   Activity Tolerance Patient tolerated treatment well   Behavior During Therapy [facility] for tasks assessed/performed      Past Medical History:  Diagnosis Date  . Allergy   . GERD (gastroesophageal reflux disease)    "does not need meds anymore"  . Heart murmur   . Hyperlipidemia   . Lactose intolerance   . 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HCC)     Past Surgical History:  Procedure Laterality Date  . ABDOMINAL HYSTERECTOMY  2006   benign tumor, complete  . KNEE ARTHROSCOPY Right   . NASAL SEPTUM SURGERY    . 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      Subjective Assessment - 11/08/16 1114    Subjective I am figuring out my pillow situation at home, getting better.    Currently in Pain? No/denies   Multiple Pain Sites No            [facility] PT Assessment - 11/08/16 0001      Assessment   Medical Diagnosis M54.2 (ICD-10-CM) - Neck pain; R20.0,R20.2 (ICD-10-CM) - Numbness and tingling in left hand   Referring Provider Cameron Sprang   Onset Date/Surgical Date 07/12/16     Observation/Other Assessments   Focus on Therapeutic Outcomes (FOTO)  31% limitation  Taken on 11/06/16     AROM   AROM Assessment Site Cervical   Cervical Flexion 30   Cervical Extension 55   Cervical - Right Side Bend WFL   Cervical - Left Side Bend WFL   Cervical - Right Rotation 55   Cervical - Left Rotation 55      Strength   Overall Strength Comments neck, shoulder strength WNL                     [facility] Adult PT Treatment/Exercise - 11/08/16 0001      Neck Exercises: Seated   Other Seated Exercise Theracane trigger point release bil upper traps with side bend stretch      Shoulder Exercises: ROM/Strengthening   UBE (Upper Arm Bike) 3 min forward/3 min back ward level 1   Wall Pushups --  2 x15                PT Education - 11/08/16 1650    Education provided Yes   Education Details Review of HEP   Person(s) Educated Patient   Methods Explanation   Comprehension Verbalized understanding          PT Short Term Goals - 10/23/16 1235      PT SHORT TERM GOAL #1   Title be independent in initial HEP   Time 4   Period Weeks   Status Achieved     PT SHORT TERM GOAL #2   Title report 30% reduction in cervical pain and stiffness after sitting long periods for work  Time 4   Period Weeks   Status Achieved     PT SHORT TERM GOAL #3   Title increased cervical flexion to 40 degrees or more due to increased soft tissue length   Time 4   Period Weeks   Status Achieved           PT Long Term Goals - 11/08/16 1354      PT LONG TERM GOAL #1   Title be independent in advanced HEP   Time 8   Period Weeks   Status Achieved  Buying weights today     PT LONG TERM GOAL #2   Title reduce FOTO to < or = to 26% limitation   Baseline 31% limited (11/06/16)   Time 8   Period Days   Status Not Met  unchanged     PT LONG TERM GOAL #3   Title demonstrate correct posture and body mechanics for ADLs and home tasks to reduce cervical stress   Time 8   Period Weeks   Status Achieved     PT LONG TERM GOAL #4   Title report a 75% reduction in cervical pain with sitting long periods   Time 8   Period Weeks   Status Achieved               Plan - 11/08/16 1114    Clinical Impression Statement Pt reports today she is ready for discharge with plans to  continue with her HEP. She has met all long term goals exept for FOTO goal which remains unchanged.    Rehab Potential Excellent   PT Frequency 2x / week   PT Duration 8 weeks   PT Treatment/Interventions ADLs/Self Care Home Management;Biofeedback;Cryotherapy;Electrical Stimulation;Iontophoresis 79m/ml Dexamethasone;Moist Heat;Traction;Ultrasound;Gait training;Stair training;Functional mobility training;Therapeutic activities;Therapeutic exercise;Balance training;Neuromuscular re-education;Patient/family education;Manual techniques;Passive range of motion;Dry needling;Taping   PT Next Visit Plan D/C pt to HEP as pt is pleased with her current functional level.    Consulted and Agree with Plan of Care Patient      Patient will benefit from skilled therapeutic intervention in order to improve the following deficits and impairments:  Pain, Postural dysfunction, Increased muscle spasms, Decreased range of motion  Visit Diagnosis: Cervicalgia  Stiffness of left shoulder, not elsewhere classified  Other muscle spasm     Problem List Patient Active Problem List   Diagnosis Date Noted  . Abnormal brain MRI 09/25/2016  . Numbness and tingling in left hand 09/25/2016  . Cervical radiculopathy at C5 09/25/2016  . Neck pain 09/25/2016  . 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 [place] root dilatation ([facility] 08/07/2016  . Ear pain 07/22/2016  . Hyperlipemia 12/02/2015  . H/O splenectomy 12/02/2015  . Hyperglycemia 01/27/2014  . Obesity 01/27/2014  . Unspecified vitamin D deficiency 09/11/2012    JMyrene Galas PTA 11/08/16 4:51 PM  [facility] [address] [place] [address]: [phone]  Fax:  [phone] Name: DLOUISA FAVAROMRN: [id number] of Birth: 9Nov 17, 1962 PHYSICAL THERAPY DISCHARGE SUMMARY  Visits from Start of Care: 9  Current functional level related to goals / functional  outcomes: See above note   Remaining deficits: See above listed   Education / Equipment: HEP Plan: Patient agrees to discharge.  Patient goals were met. Patient is being discharged due to meeting the stated rehab goals.  ?????         All but one goal met, patient pleased with current  [place]  [place], PT 11/09/16 8:27 AM

## 2016-12-01 ENCOUNTER — Encounter: Payer: Self-pay | Admitting: Hematology and Oncology

## 2016-12-07 ENCOUNTER — Encounter: Payer: Managed Care, Other (non HMO) | Admitting: Hematology and Oncology

## 2016-12-12 ENCOUNTER — Telehealth: Payer: Self-pay

## 2016-12-12 ENCOUNTER — Ambulatory Visit (HOSPITAL_BASED_OUTPATIENT_CLINIC_OR_DEPARTMENT_OTHER): Payer: Managed Care, Other (non HMO) | Admitting: Hematology and Oncology

## 2016-12-12 ENCOUNTER — Ambulatory Visit (HOSPITAL_BASED_OUTPATIENT_CLINIC_OR_DEPARTMENT_OTHER): Payer: Managed Care, Other (non HMO)

## 2016-12-12 ENCOUNTER — Encounter: Payer: Self-pay | Admitting: Hematology and Oncology

## 2016-12-12 VITALS — BP 148/92 | HR 66 | Temp 99.4°F | Resp 18 | Ht 68.0 in | Wt 223.5 lb

## 2016-12-12 DIAGNOSIS — Z9081 Acquired absence of spleen: Secondary | ICD-10-CM | POA: Diagnosis not present

## 2016-12-12 DIAGNOSIS — D649 Anemia, unspecified: Secondary | ICD-10-CM | POA: Diagnosis not present

## 2016-12-12 LAB — IRON AND TIBC
%SAT: 22 % (ref 21–57)
Iron: 71 ug/dL (ref 41–142)
TIBC: 321 ug/dL (ref 236–444)
UIBC: 250 ug/dL (ref 120–384)

## 2016-12-12 LAB — CBC & DIFF AND RETIC
BASO%: 0.6 % (ref 0.0–2.0)
Basophils Absolute: 0 10*3/uL (ref 0.0–0.1)
EOS%: 1.4 % (ref 0.0–7.0)
Eosinophils Absolute: 0.1 10*3/uL (ref 0.0–0.5)
HCT: 36.4 % (ref 34.8–46.6)
HGB: 11.6 g/dL (ref 11.6–15.9)
Immature Retic Fract: 3.7 % (ref 1.60–10.00)
LYMPH%: 34.6 % (ref 14.0–49.7)
MCH: 28.5 pg (ref 25.1–34.0)
MCHC: 31.9 g/dL (ref 31.5–36.0)
MCV: 89.4 fL (ref 79.5–101.0)
MONO#: 0.7 10*3/uL (ref 0.1–0.9)
MONO%: 9.3 % (ref 0.0–14.0)
NEUT#: 3.9 10*3/uL (ref 1.5–6.5)
NEUT%: 54.1 % (ref 38.4–76.8)
Platelets: 415 10*3/uL — ABNORMAL HIGH (ref 145–400)
RBC: 4.07 10*6/uL (ref 3.70–5.45)
RDW: 14.6 % — ABNORMAL HIGH (ref 11.2–14.5)
Retic %: 0.89 % (ref 0.70–2.10)
Retic Ct Abs: 36.22 10*3/uL (ref 33.70–90.70)
WBC: 7.1 10*3/uL (ref 3.9–10.3)
lymph#: 2.5 10*3/uL (ref 0.9–3.3)

## 2016-12-12 LAB — LACTATE DEHYDROGENASE: LDH: 221 U/L (ref 125–245)

## 2016-12-12 LAB — FERRITIN: Ferritin: 75 ng/ml (ref 9–269)

## 2016-12-12 NOTE — Progress Notes (Signed)
[facility] New Visit:  Assessment: Anemia 56 year old female with minimal but potentially progressive ascitic normochromic anemia that appears to have developed sometime earlier this year. No particular precipitating etiology at this time. Stool was negative for presence of blood which was very appropriate to be tested in the setting of antiplatelet therapy at the patient is currently receiving.  Additional possible etiologies would include decreased production of red blood cells due to secondary or primary bone marrow dysfunction versus increased red blood cell destruction, especially considering previous history of TTP or another microangiopathic hemolytic process (the TTP definition has changed significantly from 41yrs ago).  Plan:  --Labs today to evaluate possible etiologies --Return to clinic in 1 week to discuss results  Voice recognition software was used and creation of this note. Despite my best effort at editing the text, some misspelling/errors may have occurred.   Orders Placed This Encounter  Procedures  . CBC & Diff and Retic    Standing Status:   Future    Number of Occurrences:   1    Standing Expiration Date:   12/12/2017  . Ferritin    Standing Status:   Future    Number of Occurrences:   1    Standing Expiration Date:   12/12/2017  . Iron and TIBC    Standing Status:   Future    Number of Occurrences:   1    Standing Expiration Date:   12/12/2017  . Folate, Serum    Standing Status:   Future    Number of Occurrences:   1    Standing Expiration Date:   12/12/2017  . Vitamin B12    Standing Status:   Future    Number of Occurrences:   1    Standing Expiration Date:   12/12/2017  . Haptoglobin    Standing Status:   Future    Number of Occurrences:   1    Standing Expiration Date:   12/12/2017  . Lactate dehydrogenase (LDH)    Standing Status:   Future    Number of Occurrences:   1    Standing Expiration Date:   12/12/2017  . Methylmalonic  acid, serum    Standing Status:   Future    Number of Occurrences:   1    Standing Expiration Date:   12/12/2017  . Helicobacter pylori antibodies IgG, IgA, IgM    Standing Status:   Future    Number of Occurrences:   1    Standing Expiration Date:   12/12/2017  . Celiac Disease Ab Screen w/Rfx    All questions were answered.  . The patient knows to call the clinic with any problems, questions or concerns.  This note was electronically signed.    History of Presenting Illness Deanna [name] 56 y.o. presenting to the [facility] for Evaluation of anemia. Patient was initially noted to be anemic in May 2018, when the hemoglobin level returned same in June, patient was referred for consultation after fecal occult blood test was demonstrated to be negative. Previous medical history is significant for an event described as TTP during her pregnancy ears ago. As per the patient, she was treated with asthma exchange and splenectomy. She denies previous history of bleeding or clotting disorders. 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  Patient denies any fever,  chills or night sweats. She denies any unexpected weight loss. No respiratory symptoms. No current nausea, early satiety, abdominal pain, diarrhea, or constipation. Patient does describe a period of easy bruisability after starting clopidogrel in March 2018 following a "mini stroke". The bruising has since resolved although the patient is remaining on the medication. Patient denies any new urinary or urological symptoms.  Oncological/hematological History: --Labs, 10/04/16: WBC 7.2, Hgb 12.2, MCV 85.7, MCH ..., RDW 14.3, Plt 417; --Labs, 12/02/15: WBC 7.1, Hgb 12.0, MCV 86.0, MCH ..., RDW 13.9, Plt 422; --Labs, 07/18/16: WBC 7.2, Hgb 12.1, MCV 86.9, MCH ..., RDW 14.1, Plt 454; TSH  1.33 --Labs, 10/05/16: WBC 7.2, Hgb 11.7, MCV 86.5, MCH ..., RDW 14.2, Plt 407; FOBT -- negative x5 --Labs, 10/18/16: WBC 6.9, Hgb 11.7, MCV 86.1, MCH ..., RDW 14.2, Plt 434;   Medical History: Past Medical History:  Diagnosis Date  . Allergy   . GERD (gastroesophageal reflux disease)    "does not need meds anymore"  . Heart murmur   . Hyperlipidemia   . Lactose intolerance   . 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HCC)     Surgical History: Past Surgical History:  Procedure Laterality Date  . ABDOMINAL HYSTERECTOMY  2006   benign tumor, complete  . KNEE ARTHROSCOPY Right   . NASAL SEPTUM SURGERY    . 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 Arthritis Mother   . Hyperlipidemia Mother   . Stroke Mother   . Hypertension Mother   . Colon cancer Neg Hx     Social History: Social History   Social History  . Marital status: Married    Spouse name: N/A  . Number of children: N/A  . Years of education: N/A   Occupational History  . Not on file.   Social History Main Topics  . Smoking status: Never Smoker  . Smokeless tobacco: Never Used  . Alcohol use No  . Drug use: No  . Sexual activity: Not on file   Other Topics Concern  . 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 Eggs Or Egg-Derived Products Nausea And Vomiting  . Lactose Intolerance (Gi) Other (See Comments)    vomiting    Medications:  Current Outpatient Prescriptions  Medication Sig Dispense Refill  . clopidogrel (PLAVIX) 75 MG tablet Take 1 tablet (75 mg total) by mouth daily. 30 tablet 11  . pravastatin (PRAVACHOL) 40 MG tablet Take 1 tablet (40 mg  total) by mouth daily. 90 tablet 0  . triamcinolone cream (KENALOG) 0.1 % Apply 1 application topically 2 (two) times daily. 30 g 1   No current facility-administered medications for this visit.     Review of Systems: Review of Systems  All other systems reviewed and are negative.    PHYSICAL EXAMINATION Blood pressure (!) 148/92, pulse 66, temperature 99.4 F (37.4 C), temperature source Oral, resp. rate 18, height 5\' 8"  (1.727 m), weight 223 lb 8 oz (101.4 kg), SpO2 100 %.  ECOG PERFORMANCE STATUS: 1 - Symptomatic but  completely ambulatory  Physical Exam  Constitutional: She is oriented to person, place, and time and well-developed, well-nourished, and in no distress. No distress.  HENT:  Head: Normocephalic and atraumatic.  Mouth/Throat: Oropharynx is clear and moist. No oropharyngeal exudate.  Eyes: Pupils are equal, round, and reactive to light. Conjunctivae and EOM are normal. No scleral icterus.  Neck: Normal range of motion.  Cardiovascular: Normal rate and regular rhythm.  Exam reveals no gallop.   W1/U2, and an diastolic murmur best heard over the pulmonary valve projection.  Pulmonary/Chest: Effort normal and breath sounds normal. She has no wheezes. She has no rales.  Abdominal: Soft. She exhibits no distension and no mass. There is no tenderness. There is no rebound and no guarding.  Musculoskeletal: She exhibits no edema.  Lymphadenopathy:    She has no cervical adenopathy.  Neurological: She is alert and oriented to person, place, and time. She has normal reflexes.  Skin: Skin is warm and dry. No rash noted. She is not diaphoretic. No erythema. No pallor.     LABORATORY DATA: I have personally reviewed the data as listed: Appointment on 12/12/2016  Component Date Value Ref Range Status  . WBC 12/12/2016 7.1  3.9 - 10.3 10e3/uL Final  . NEUT# 12/12/2016 3.9  1.5 - 6.5 10e3/uL Final  . HGB 12/12/2016 11.6  11.6 - 15.9 g/dL Final  . HCT 12/12/2016 36.4  34.8 -  46.6 % Final  . Platelets 12/12/2016 415* 145 - 400 10e3/uL Final  . MCV 12/12/2016 89.4  79.5 - 101.0 fL Final  . MCH 12/12/2016 28.5  25.1 - 34.0 pg Final  . MCHC 12/12/2016 31.9  31.5 - 36.0 g/dL Final  . RBC 12/12/2016 4.07  3.70 - 5.45 10e6/uL Final  . RDW 12/12/2016 14.6* 11.2 - 14.5 % Final  . lymph# 12/12/2016 2.5  0.9 - 3.3 10e3/uL Final  . MONO# 12/12/2016 0.7  0.1 - 0.9 10e3/uL Final  . Eosinophils Absolute 12/12/2016 0.1  0.0 - 0.5 10e3/uL Final  . Basophils Absolute 12/12/2016 0.0  0.0 - 0.1 10e3/uL Final  . NEUT% 12/12/2016 54.1  38.4 - 76.8 % Final  . LYMPH% 12/12/2016 34.6  14.0 - 49.7 % Final  . MONO% 12/12/2016 9.3  0.0 - 14.0 % Final  . EOS% 12/12/2016 1.4  0.0 - 7.0 % Final  . BASO% 12/12/2016 0.6  0.0 - 2.0 % Final  . Retic % 12/12/2016 0.89  0.70 - 2.10 % Final  . Retic Ct Abs 12/12/2016 36.22  33.70 - 90.70 10e3/uL Final  . Immature Retic Fract 12/12/2016 3.70  1.60 - 10.00 % Final  . Ferritin 12/12/2016 75  9 - 269 ng/ml Final  . Iron 12/12/2016 71  41 - 142 ug/dL Final  . TIBC 12/12/2016 321  236 - 444 ug/dL Final  . UIBC 12/12/2016 250  120 - 384 ug/dL Final  . %SAT 12/12/2016 22  21 - 57 % Final  . LDH 12/12/2016 221  125 - 245 U/L Final         Ardath Sax, MD

## 2016-12-12 NOTE — Assessment & Plan Note (Signed)
56 year old female with minimal but potentially progressive ascitic normochromic anemia that appears to have developed sometime earlier this year. No particular precipitating etiology at this time. Stool was negative for presence of blood which was very appropriate to be tested in the setting of antiplatelet therapy at the patient is currently receiving.  Additional possible etiologies would include decreased production of red blood cells due to secondary or primary bone marrow dysfunction versus increased red blood cell destruction, especially considering previous history of TTP or another microangiopathic hemolytic process (the TTP definition has changed significantly from 60yrs ago).  Plan:  --Labs today to evaluate possible etiologies --Return to clinic in 1 week to discuss results  Voice recognition software was used and creation of this note. Despite my best effort at editing the text, some misspelling/errors may have occurred.

## 2016-12-12 NOTE — Telephone Encounter (Signed)
appts made and avs printed for patient 

## 2016-12-13 LAB — HELICOBACTER PYLORI ABS-IGG+IGA, BLD
H. pylori, IgA Abs: 27.3 units — ABNORMAL HIGH (ref 0.0–8.9)
H. pylori, IgG AbS: 1.04 Index Value — ABNORMAL HIGH (ref 0.00–0.79)

## 2016-12-13 LAB — HAPTOGLOBIN: Haptoglobin: 116 mg/dL (ref 34–200)

## 2016-12-13 LAB — VITAMIN B12: Vitamin B12: >2000 pg/mL — ABNORMAL HIGH (ref 232–1245)

## 2016-12-13 LAB — FOLATE: Folate: 12.4 ng/mL (ref >3.0)

## 2016-12-19 ENCOUNTER — Telehealth: Payer: Self-pay | Admitting: Hematology and Oncology

## 2016-12-19 ENCOUNTER — Ambulatory Visit (HOSPITAL_BASED_OUTPATIENT_CLINIC_OR_DEPARTMENT_OTHER): Payer: Managed Care, Other (non HMO) | Admitting: Hematology and Oncology

## 2016-12-19 VITALS — BP 148/81 | HR 77 | Temp 99.0°F | Resp 18 | Ht 68.0 in | Wt 223.6 lb

## 2016-12-19 DIAGNOSIS — D649 Anemia, unspecified: Secondary | ICD-10-CM | POA: Diagnosis not present

## 2016-12-19 DIAGNOSIS — D473 Essential (hemorrhagic) thrombocythemia: Secondary | ICD-10-CM | POA: Diagnosis not present

## 2016-12-19 DIAGNOSIS — A048 Other specified bacterial intestinal infections: Secondary | ICD-10-CM | POA: Diagnosis not present

## 2016-12-19 LAB — METHYLMALONIC ACID, SERUM: Methylmalonic Acid, Serum: 75 nmol/L (ref 0–378)

## 2016-12-19 NOTE — Telephone Encounter (Signed)
Gave patient avs and calendar for Feb 2019.

## 2016-12-21 DIAGNOSIS — A048 Other specified bacterial intestinal infections: Secondary | ICD-10-CM | POA: Insufficient documentation

## 2016-12-21 NOTE — Progress Notes (Signed)
Deanna [name] Follow-up Visit:  Assessment: Anemia 56 year old female with minimal but potentially progressive normocytic normochromic anemia that appears to have developed sometime earlier this year. Stool was negative for presence of blood which was very appropriate to be tested in the setting of antiplatelet therapy at the patient is currently receiving. Our additional evaluation revealed serology for H. pylori which may be the explanation of mild anemia and thrombocytosis.   Plan:  --Consult gastroenterology for EGD and H. pylori therapy --Return to clinic in 6 months for repeat labwork   Voice recognition software was used and creation of this note. Despite my best effort at editing the text, some misspelling/errors may have occurred.   Orders Placed This Encounter  Procedures  . CBC & Diff and Retic    Standing Status:   Future    Standing Expiration Date:   12/19/2017  . Comprehensive metabolic panel    Standing Status:   Future    Standing Expiration Date:   12/19/2017  . Ambulatory referral to Gastroenterology    Referral Priority:   Routine    Referral Type:   Consultation    Referral Reason:   Specialty Services Required    Number of Visits Requested:   1    All questions were answered.  . The patient knows to call the clinic with any problems, questions or concerns.  This note was electronically signed.    History of Presenting Illness Deanna [name] 56 y.o. Deanna [name] 56 y.o. followed in the [place] for evaluation of anemia. Patient was initially noted to be anemic in May 2018, when the hemoglobin level returned same in June, patient was referred for consultation after fecal occult blood test was demonstrated to be negative. Previous medical history is significant for an event described as TTP during her pregnancy ears ago. As per the patient, she was treated with asthma exchange and splenectomy. She denies previous history of bleeding  or clotting disorders. 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  Patient denies any fever, chills or night sweats. She denies any unexpected weight loss. No respiratory symptoms. No current nausea, early satiety, abdominal pain, diarrhea, or constipation. Patient does describe a period of easy bruisability after starting clopidogrel in March 2018 following a "mini stroke". The bruising has since resolved although the patient is remaining on the medication. Patient denies any new urinary or urological symptoms.  Patient underwent lab work, and returns to the clinic to review the findings. Patient denies any new complaints since last visit to the clinic.  Oncological/hematological History: --Labs, 10/04/16: WBC 7.2, Hgb 12.2, MCV 85.7, MCH ..., RDW 14.3, Plt 417; --Labs, 12/02/15: WBC 7.1, Hgb 12.0, MCV 86.0, MCH ..., RDW 13.9, Plt 422; --Labs, 07/18/16: WBC 7.2, Hgb 12.1, MCV 86.9, MCH ..., RDW 14.1, Plt 454; TSH 1.33 --Labs, 10/05/16: WBC 7.2, Hgb 11.7, MCV 86.5, MCH ..., RDW 14.2, Plt 407; FOBT -- negative x5 --Labs, 10/18/16: WBC 6.9, Hgb 11.7, MCV 86.1, MCH ..., RDW 14.2, Plt 434; --Labs, 12/12/16: WBC 7.1, Hgb 11.6, MCV 89.4, MCH 28.5, RDW 14.6, Plt 415; H pylori Ab IgA & IgG positive   Medical History: Past Medical History:  Diagnosis Date  . Allergy   . GERD (gastroesophageal reflux disease)    "does not need meds anymore"  . Heart murmur   . Hyperlipidemia   .  Lactose intolerance   . 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HCC)     Surgical History: Past Surgical History:  Procedure Laterality Date  . ABDOMINAL HYSTERECTOMY  2006   benign tumor, complete  . KNEE ARTHROSCOPY Right   . NASAL SEPTUM SURGERY    . 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 Arthritis Mother   . Hyperlipidemia Mother   . Stroke Mother   . Hypertension Mother   . Colon [name] Neg Hx     Social History: Social History   Social History  . Marital status: Married    Spouse name: N/A  . Number of children: N/A  . Years of education: N/A   Occupational History  . Not on file.   Social History Main Topics  . Smoking status: Never Smoker  . Smokeless tobacco: Never Used  . Alcohol use No  . Drug use: No  . Sexual activity: Not on file   Other Topics Concern  . 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 Eggs Or Egg-Derived Products Nausea And Vomiting  . Lactose Intolerance (Gi) Other (See Comments)    vomiting    Medications:  Current Outpatient Prescriptions  Medication Sig Dispense Refill  . clopidogrel (PLAVIX) 75 MG tablet Take 1 tablet (75 mg total) by mouth daily. 30 tablet 11  . pravastatin (PRAVACHOL) 40 MG tablet Take 1 tablet (40 mg total) by mouth daily. 90 tablet 0  . triamcinolone cream (KENALOG) 0.1 % Apply 1 application topically 2 (two) times daily. 30 g 1   No current facility-administered medications for this visit.     Review of Systems: Review of Systems  All other systems reviewed and are negative.    PHYSICAL EXAMINATION Blood pressure (!) 148/81, pulse 77, temperature 99 F (37.2 C), temperature source Oral, resp. rate 18, height 5\' 8"  (1.727 m), weight 223 lb 9.6 oz (101.4 kg), SpO2 100 %.  ECOG PERFORMANCE STATUS: 1 - Symptomatic but completely ambulatory  Physical Exam  Constitutional: She is oriented to person, place, and time and well-developed, well-nourished, and in no distress. No distress.  HENT:  Head: Normocephalic  and atraumatic.  Mouth/Throat: Oropharynx is clear and moist. No oropharyngeal exudate.  Eyes: Pupils are equal, round, and reactive to light. Conjunctivae and EOM are normal. No scleral icterus.  Neck: Normal range of motion.  Cardiovascular: Normal rate and regular rhythm.  Exam reveals no gallop.   W0/J8, and an diastolic murmur best heard over the pulmonary valve projection.  Pulmonary/Chest: Effort normal and breath sounds normal. She has no wheezes. She has no rales.  Abdominal: Soft. She exhibits no distension and no mass. There is no tenderness. There is no rebound and no guarding.  Musculoskeletal: She exhibits no edema.  Lymphadenopathy:    She has no cervical adenopathy.  Neurological: She is alert and oriented to person, place, and time. She has normal reflexes.  Skin: Skin is warm and dry. No rash noted. She is not diaphoretic. No erythema. No pallor.  LABORATORY DATA: I have personally reviewed the data as listed: No visits with results within 1 Week(s) from this visit.  Latest known visit with results is:  Appointment on 12/12/2016  Component Date Value Ref Range Status  . WBC 12/12/2016 7.1  3.9 - 10.3 10e3/uL Final  . NEUT# 12/12/2016 3.9  1.5 - 6.5 10e3/uL Final  . HGB 12/12/2016 11.6  11.6 - 15.9 g/dL Final  . HCT 12/12/2016 36.4  34.8 - 46.6 % Final  . Platelets 12/12/2016 415* 145 - 400 10e3/uL Final  . MCV 12/12/2016 89.4  79.5 - 101.0 fL Final  . MCH 12/12/2016 28.5  25.1 - 34.0 pg Final  . MCHC 12/12/2016 31.9  31.5 - 36.0 g/dL Final  . RBC 12/12/2016 4.07  3.70 - 5.45 10e6/uL Final  . RDW 12/12/2016 14.6* 11.2 - 14.5 % Final  . lymph# 12/12/2016 2.5  0.9 - 3.3 10e3/uL Final  . MONO# 12/12/2016 0.7  0.1 - 0.9 10e3/uL Final  . Eosinophils Absolute 12/12/2016 0.1  0.0 - 0.5 10e3/uL Final  . Basophils Absolute 12/12/2016 0.0  0.0 - 0.1 10e3/uL Final  . NEUT% 12/12/2016 54.1  38.4 - 76.8 % Final  . LYMPH% 12/12/2016 34.6  14.0 - 49.7 % Final  . MONO%  12/12/2016 9.3  0.0 - 14.0 % Final  . EOS% 12/12/2016 1.4  0.0 - 7.0 % Final  . BASO% 12/12/2016 0.6  0.0 - 2.0 % Final  . Retic % 12/12/2016 0.89  0.70 - 2.10 % Final  . Retic Ct Abs 12/12/2016 36.22  33.70 - 90.70 10e3/uL Final  . Immature Retic Fract 12/12/2016 3.70  1.60 - 10.00 % Final  . Ferritin 12/12/2016 75  9 - 269 ng/ml Final  . Iron 12/12/2016 71  41 - 142 ug/dL Final  . TIBC 12/12/2016 321  236 - 444 ug/dL Final  . UIBC 12/12/2016 250  120 - 384 ug/dL Final  . %SAT 12/12/2016 22  21 - 57 % Final  . Folate 12/12/2016 12.4  >3.0 ng/mL Final   Comment: A serum folate concentration of less than 3.1 ng/mL is considered to represent clinical deficiency.   . Vitamin B12 12/12/2016 >2000* 232 - 1245 pg/mL Final  . Haptoglobin 12/12/2016 116  34 - 200 mg/dL Final  . LDH 12/12/2016 221  125 - 245 U/L Final  . Methylmalonic Acid, Serum 12/12/2016 75  0 - 378 nmol/L Final  . Disclaimer: 12/12/2016 Comment   Final   Comment: This test was developed and its performance characteristics determined by [facility]. It has not been cleared or approved by the Food and Drug Administration.   . H. pylori, IgA Abs 12/12/2016 27.3* 0.0 - 8.9 units Final   Comment:                                                Negative          <9.0                                                Equivocal   9.0 - 11.0  Positive         >11.0   . H. pylori, IgG AbS 12/12/2016 1.04* 0.00 - 0.79 Index Value Final   Comment:                                             Negative           <0.80                                             Equivocal    0.80 - 0.89                                             Positive           >0.89        Ardath Sax, MD

## 2016-12-21 NOTE — Assessment & Plan Note (Signed)
57 year old female with minimal but potentially progressive normocytic normochromic anemia that appears to have developed sometime earlier this year. Stool was negative for presence of blood which was very appropriate to be tested in the setting of antiplatelet therapy at the patient is currently receiving. Our additional evaluation revealed serology for H. pylori which may be the explanation of mild anemia and thrombocytosis.   Plan:  --Consult gastroenterology for EGD and H. pylori therapy --Return to clinic in 6 months for repeat labwork   Voice recognition software was used and creation of this note. Despite my best effort at editing the text, some misspelling/errors may have occurred.

## 2017-01-04 ENCOUNTER — Other Ambulatory Visit: Payer: Self-pay

## 2017-01-04 ENCOUNTER — Other Ambulatory Visit: Payer: Managed Care, Other (non HMO)

## 2017-01-04 ENCOUNTER — Ambulatory Visit (INDEPENDENT_AMBULATORY_CARE_PROVIDER_SITE_OTHER): Payer: Managed Care, Other (non HMO) | Admitting: Physician Assistant

## 2017-01-04 ENCOUNTER — Encounter: Payer: Self-pay | Admitting: Physician Assistant

## 2017-01-04 VITALS — BP 120/70 | HR 72 | Ht 69.0 in | Wt 221.0 lb

## 2017-01-04 DIAGNOSIS — R768 Other specified abnormal immunological findings in serum: Secondary | ICD-10-CM

## 2017-01-04 DIAGNOSIS — D649 Anemia, unspecified: Secondary | ICD-10-CM | POA: Diagnosis not present

## 2017-01-04 DIAGNOSIS — R799 Abnormal finding of blood chemistry, unspecified: Secondary | ICD-10-CM

## 2017-01-04 NOTE — Patient Instructions (Signed)
Please go to the basement level to have your labs drawn.  Amy's nurse Eustaquio Maize will call you with the results.

## 2017-01-04 NOTE — Progress Notes (Addendum)
Subjective:    Patient ID: Deanna [name], female    DOB: [birth year], 56 y.o.   MRN: [id number]  HPI Deanna [name] is a pleasant 56 year old African-American female known to Deanna [name] who is referred today by Deanna [name] for evaluation of a mild normocytic normochromic anemia and a positive H. pylori antibody. 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 Also with a cervical radiculopathy and history of a TIA about 3 months ago. She has been on Plavix. Patient underwent colonoscopy here in 2015 with finding of left-sided diverticulosis and no colon polyps. She has not had prior EGD. She has had very mild progression of anemia over the past year. Review of her labs shows hemoglobin of 12 and hematocrit of 36.6 in July 2017, repeat labs July 2018 hemoglobin 11.6 hematocrit of 36.4 platelets 415, iron studies within normal limits, B12 and folate normal. She had an H. pylori antibody IgG returned positive. She has no complaints of abdominal pain. Appetite has been fine, her weight may be down a couple of pounds this summer. His occasional mild constipation, takes a probiotic which is helpful. No complaints of heartburn or indigestion no dysphagia, no nausea she does get bloating and gas periodically but it sounds as if this is diet related particularly with onions CABG etc.  Review of Systems Pertinent positive and negative review of systems were noted in the above HPI section.  All other review of systems was otherwise negative.  Outpatient Encounter Prescriptions as of 01/04/2017  Medication Sig  . clopidogrel (PLAVIX) 75 MG tablet Take 1 tablet (75 mg total) by mouth daily.  . pravastatin (PRAVACHOL) 40 MG tablet Take 1 tablet (40 mg total) by mouth daily.  Marland Kitchen triamcinolone cream (KENALOG) 0.1 % Apply 1 application topically 2 (two) times daily.   No facility-administered encounter medications on file as of 01/04/2017.    Allergies  Allergen Reactions  . Eggs  Or Egg-Derived Products Nausea And Vomiting  . Lactose Intolerance (Gi) Other (See Comments)    vomiting   Patient Active Problem List   Diagnosis Date Noted  . H. pylori infection 12/21/2016  . Anemia 12/12/2016  . Abnormal brain MRI 09/25/2016  . Numbness and tingling in left hand 09/25/2016  . Cervical radiculopathy at C5 09/25/2016  . Neck pain 09/25/2016  . 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 Aortic root dilatation (Deanna [name]) 08/07/2016  . Ear pain 07/22/2016  . Hyperlipemia 12/02/2015  . H/O splenectomy 12/02/2015  . Hyperglycemia 01/27/2014  . Obesity 01/27/2014  . Unspecified vitamin D deficiency 09/11/2012   Social History   Social History  . Marital status: Married    Spouse name: N/A  . Number of children: N/A  . Years of education: N/A   Occupational History  . Not on file.   Social History Main Topics  . Smoking status: Never Smoker  . Smokeless tobacco: Never Used  . Alcohol use No  . Drug use: No  . Sexual activity: Not on file   Other Topics Concern  . 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 family history includes Arthritis in  her mother; Hyperlipidemia in her mother; Hypertension in her mother; Stroke in her mother.      Objective:    Vitals:   01/04/17 1040  BP: 120/70  Pulse: 72    Physical Exam  well-developed African-American female in no acute distress, very pleasant blood pressure 120/70 pulse 72, BMI 32.6. HEENT ;nontraumatic normocephalic EOMI PERRLA sclera anicteric, Cardiovascular; regular rate and rhythm with S1-S2 soft murmur, Pulmonary ;clear bilaterally, Abdomen ;soft, nontender nondistended bowel sounds are active there is no palpable mass or hepatosplenomegaly she does have a large  upper abdominal incisional scar, and a hysterectomy scar. Rectal ;exam not done, 56 no clubbing cyanosis or edema skin warm and dry, Neuropsych ;mood and affect appropriate       Assessment & Plan:   #16 56 year old African-American female with mild normocytic normochromic anemia. Stool documented Hemoccult negative, no evidence of iron B12 or folate deficiency. Patient does have history of TTP/remote splenectomy. With heme-negative stool, no iron deficiency and no significant GI symptoms very unlikely that she has a GI source for her anemia. She does have a positive H. pylori antibody, there has been some correlation noted in the literature with H. pylori possibly contributing to iron deficiency anemia.  #2 colon cancer surveillance-up-to-date with colonoscopy last done 2015, no polyps #3 diverticulosis #4 antiplatelet therapy-on Plavix #5 history of TIA/on Plavix.  Plan; Will  check H. pylori stool antigen, and if positive treat for active infection to eradicate. Plan follow-up colonoscopy 2025.  Deanna S Esterwood PA-C 01/04/2017   Cc: Deanna Sax, MD   H pylori stool Ag was negative No further GI work-up  Agree with Deanna [name] assessment and plan. Deanna Mayer, MD, Deanna [name]

## 2017-01-08 LAB — H. PYLORI ANTIGEN, STOOL: H pylori Ag, Stl: NEGATIVE

## 2017-01-22 ENCOUNTER — Ambulatory Visit
Admission: RE | Admit: 2017-01-22 | Discharge: 2017-01-22 | Disposition: A | Discharge status: Home / Self Care | Payer: Managed Care, Other (non HMO) | Source: Ambulatory Visit | Attending: Neurology | Admitting: Neurology

## 2017-01-22 DIAGNOSIS — R202 Paresthesia of skin: Secondary | ICD-10-CM

## 2017-01-22 DIAGNOSIS — R200 Anesthesia of skin: Secondary | ICD-10-CM

## 2017-01-22 DIAGNOSIS — M542 Cervicalgia: Secondary | ICD-10-CM

## 2017-01-22 DIAGNOSIS — R9089 Other abnormal findings on diagnostic imaging of central nervous system: Secondary | ICD-10-CM

## 2017-01-22 DIAGNOSIS — M5412 Radiculopathy, cervical region: Secondary | ICD-10-CM

## 2017-01-22 MED ORDER — GADOBENATE DIMEGLUMINE 529 MG/ML IV SOLN
20.0000 mL | Freq: Once | INTRAVENOUS | Status: AC | PRN
  Administered 2017-01-22: 20 mL via INTRAVENOUS

## 2017-01-24 ENCOUNTER — Ambulatory Visit (INDEPENDENT_AMBULATORY_CARE_PROVIDER_SITE_OTHER): Payer: Managed Care, Other (non HMO) | Admitting: Neurology

## 2017-01-24 ENCOUNTER — Encounter: Payer: Self-pay | Admitting: Neurology

## 2017-01-24 VITALS — BP 118/72 | HR 61 | Ht 69.0 in | Wt 223.0 lb

## 2017-01-24 DIAGNOSIS — M5412 Radiculopathy, cervical region: Secondary | ICD-10-CM

## 2017-01-24 DIAGNOSIS — R9089 Other abnormal findings on diagnostic imaging of central nervous system: Secondary | ICD-10-CM | POA: Diagnosis not present

## 2017-01-24 MED ORDER — GABAPENTIN 100 MG PO CAPS: ORAL_CAPSULE | ORAL | 6 refills | Status: DC

## 2017-01-24 NOTE — Patient Instructions (Signed)
1. Start Gabapentin 100mg : Take 1 cap at night for 1 week, then increase to 2 caps at night. Call our office for an update on how you are doing in 2 months, we can push up dose if needed 2. Continue all your other medications 3. Follow-up in 6 months, call for any changes

## 2017-01-24 NOTE — Progress Notes (Signed)
NEUROLOGY FOLLOW UP OFFICE NOTE  Deanna [name] [id number] 22-Jun-1960  HISTORY OF PRESENT ILLNESS: I had the pleasure of seeing Deanna [name] in follow-up in the neurology clinic on 01/24/2017.  The patient was last seen 4 months ago after she had an episode of left hand numbness and tingling, as well as reporting a 3-year history of intermittent dizzy spells. Her MRI brain showed a faint punctate focus of reduced diffusion in the right frontal lobe deep white matter, possibly due to acute ischemia or hyperacute demyelination. There were scattered FLAIR hyperintensities seen bilaterally. Stroke workup unremarkable, she is taking Plavix 75mg  daily without side effects. MRI cervical and thoracic spine did not show any demyelinating lesions, there was note of disc bulges possibly irritating left C5 nerve root, likely the cause of left arm symptoms. She underwent PT and reports that the arm symptoms have resolved, but she continues to have sharp pains in her neck, worse at night where she has difficulty getting comfortable. She denies any further dizziness, no headaches, bowel/bladder dysfunction, vision changes, no falls. She has some left ear pain and sinus issues.   I personally reviewed follow-up MRI brain with and without contrast done yesterday which did not show any diffusion abnormalities. There was mild ventriculomegaly on the basis of global brain volume loss. White matter changes in the right parietal cortical to subcortical white matter and throughout the cerebrum were again seen, no new lesions or abnormal enhancement.   HPI 07/20/2016: This is a pleasant 56 yo RH woman with a history of hyperlipidemia who reported a 3-year history of intermittent episodes of dizziness described as spinning, as well as feeling lightheaded with blurred vision lasting 20-30 seconds. Recently she started having different symptoms, around 2 weeks ago she started getting carsick, which is new for her. A week ago,  her left hand was numb, "like it was not there," then 20-30 seconds later it started tingling. Face and leg were unaffected. She did not have any neck pain at that time, but 3 days later started having a headache with pressure over the frontal regions that she ascribed to her sinuses. She started having pretty significant intermittent nausea. She continued to have dizziness, last episode was a week ago. The headaches have resolved. Blurred vision still comes and goes. She denies any diplopia, dysarthria/dysphagia, back pain, bowel/bladder dysfunction, negative Lhermitte sign. She denies any prior history of focal symptoms, no prior history of vision loss. She denies any head injuries. 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 There is a family history of stroke.  She had an MRI brain with and without contrast last 07/18/2016 which I personally reviewed, there was a faint punctate focus of reduced diffusion in the right frontal lobe deep white matter, difficult to see on the ADC map due to small size, this can be seen with acute ischemia or hyperacute demyelination. There were at least 5 additional subcentimeter white matter, right parietal, medial left thalamus, and left occipital horn FLAIR hyperintensities. There was mild prominence of the ventricles and sulci for age. No abnormal enhancement seen. 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   PAST MEDICAL HISTORY: Past Medical History:  Diagnosis Date  . Allergy   . GERD (gastroesophageal reflux disease)    "does not need meds anymore"  . Heart murmur   . Hyperlipidemia   . Lactose intolerance   . 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HCC)  MEDICATIONS: Current Outpatient Prescriptions on File Prior to Visit  Medication Sig Dispense Refill  . clopidogrel (PLAVIX) 75 MG tablet Take 1 tablet (75 mg total) by mouth daily. 30 tablet 11  . pravastatin (PRAVACHOL)  40 MG tablet Take 1 tablet (40 mg total) by mouth daily. 90 tablet 0  . triamcinolone cream (KENALOG) 0.1 % Apply 1 application topically 2 (two) times daily. 30 g 1   No current facility-administered medications on file prior to visit.     ALLERGIES: Allergies  Allergen Reactions  . Eggs Or Egg-Derived Products Nausea And Vomiting  . Lactose Intolerance (Gi) Other (See Comments)    vomiting    FAMILY HISTORY: Family History  Problem Relation Age of Onset  . Arthritis Mother   . Hyperlipidemia Mother   . Stroke Mother   . Hypertension Mother   . Colon cancer Neg Hx     SOCIAL HISTORY: Social History   Social History  . Marital status: Married    Spouse name: N/A  . Number of children: N/A  . Years of education: N/A   Occupational History  . Not on file.   Social History Main Topics  . Smoking status: Never Smoker  . Smokeless tobacco: Never Used  . Alcohol use No  . Drug use: No  . Sexual activity: Not on file   Other Topics Concern  . 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: lives 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, husband and mother      Spiritual Beliefs: Christian      Lifestyle: walks a little a few days per week, diet is ok             REVIEW OF SYSTEMS: Constitutional: No fevers, chills, or sweats, no generalized fatigue, change in appetite Eyes: No visual changes, double vision, eye pain Ear, nose and throat: No hearing loss, ear pain, nasal congestion, sore throat Cardiovascular: No chest pain, palpitations Respiratory:  No shortness of breath at rest or with exertion, wheezes GastrointestinaI: No nausea, vomiting, diarrhea, abdominal pain, fecal incontinence Genitourinary:  No dysuria, urinary retention or frequency Musculoskeletal:  + neck pain, back pain Integumentary: No rash, pruritus, skin lesions Neurological: as above Psychiatric: No depression, insomnia, anxiety Endocrine: No  palpitations, fatigue, diaphoresis, mood swings, change in appetite, change in weight, increased thirst Hematologic/Lymphatic:  No anemia, purpura, petechiae. Allergic/Immunologic: no itchy/runny eyes, nasal congestion, recent allergic reactions, rashes  PHYSICAL EXAM: Vitals:   01/24/17 1127  BP: 118/72  Pulse: 61  SpO2: 97%   General: No acute distress Head:  Normocephalic/atraumatic Neck: supple, no paraspinal tenderness, full range of motion Heart:  Regular rate and rhythm Lungs:  Clear to auscultation bilaterally Back: No paraspinal tenderness Skin/Extremities: No rash, no edema Neurological Exam: alert and oriented to person, place, and time. No aphasia or dysarthria. Fund of knowledge is appropriate.  Recent and remote memory are intact.  Attention and concentration are normal.    Able to name objects and repeat phrases. Cranial nerves: Pupils equal, round, reactive to light.  Extraocular movements intact with no nystagmus. Visual fields full. Facial sensation intact. No facial asymmetry. Tongue, uvula, palate midline.  Motor: Bulk and tone normal, muscle strength 5/5 throughout with no pronator drift.  Sensation to light touch intact.  No extinction to double simultaneous stimulation.  Deep tendon reflexes 2+ throughout, toes downgoing.  Finger to nose testing intact.  Gait narrow-based and  steady, able to tandem walk adequately.  Romberg negative.  IMPRESSION: This is a 56 yo RH woman with a history of hyperlipidemia, with a 3-year history of intermittent dizziness suggestive of vertigo, who presented with new onset symptoms of worsening dizziness with nausea, headaches, and a transient episode of left hand numbness and tingling. She denies any further dizziness or headaches. She continued to report occasional numbness and tingling in her left hand. MRI brain had shown a faint punctate focus of reduced diffusion in the right frontal lobe deep white matter, difficult to see on the ADC  map due to small size, this can be seen with acute ischemia or hyperacute demyelination. This seemed to correlate with her left hand symptoms, however she also has left C5 radiculopathy on her cervical MRI. The left hand symptoms are improved after PT, but she continues to deal with neck pain. She will start low dose gabapentin 100mg  qhs x 1 week, then increase to 200mg  qhs. Side effects were discussed, this may be uptitrated as tolerated. We discussed repeat MRI brain findings, no acute changes, no new lesions. Continue to monitor clinically, she does not fit MS criteria at this point. Continue Plavix and control of vascular risk factors. She knows to go to the ER immediately for any change in symptoms. She will follow-up in 6 months and knows to call for any changes.   Thank you for allowing me to participate in her care.  Please do not hesitate to call for any questions or concerns.  The duration of this appointment visit was 25 minutes of face-to-face time with the patient.  Greater than 50% of this time was spent in counseling, explanation of diagnosis, planning of further management, and coordination of care.   Ellouise Newer, M.D.   CC: Dr. Maudie Mercury

## 2017-02-01 ENCOUNTER — Encounter: Payer: Self-pay | Admitting: Family Medicine

## 2017-03-02 ENCOUNTER — Other Ambulatory Visit: Payer: Self-pay | Admitting: Family Medicine

## 2017-03-02 DIAGNOSIS — Z139 Encounter for screening, unspecified: Secondary | ICD-10-CM

## 2017-03-23 ENCOUNTER — Ambulatory Visit
Admission: RE | Admit: 2017-03-23 | Discharge: 2017-03-23 | Disposition: A | Discharge status: Home / Self Care | Payer: Managed Care, Other (non HMO) | Source: Ambulatory Visit | Attending: Family Medicine | Admitting: Family Medicine

## 2017-03-23 DIAGNOSIS — Z139 Encounter for screening, unspecified: Secondary | ICD-10-CM

## 2017-06-21 ENCOUNTER — Inpatient Hospital Stay: Payer: Managed Care, Other (non HMO) | Attending: Hematology and Oncology | Admitting: Hematology and Oncology

## 2017-06-21 ENCOUNTER — Inpatient Hospital Stay: Payer: Managed Care, Other (non HMO)

## 2017-06-21 ENCOUNTER — Encounter: Payer: Self-pay | Admitting: Hematology and Oncology

## 2017-06-21 DIAGNOSIS — D649 Anemia, unspecified: Secondary | ICD-10-CM | POA: Diagnosis present

## 2017-06-21 LAB — CBC WITH DIFFERENTIAL (CANCER CENTER ONLY)
Basophils Absolute: 0.1 10*3/uL (ref 0.0–0.1)
Basophils Relative: 1 %
Eosinophils Absolute: 0.1 10*3/uL (ref 0.0–0.5)
Eosinophils Relative: 2 %
HCT: 36.5 % (ref 34.8–46.6)
Hemoglobin: 11.8 g/dL (ref 11.6–15.9)
Lymphocytes Relative: 30 %
Lymphs Abs: 2.2 10*3/uL (ref 0.9–3.3)
MCH: 28.6 pg (ref 25.1–34.0)
MCHC: 32.3 g/dL (ref 31.5–36.0)
MCV: 88.4 fL (ref 79.5–101.0)
Monocytes Absolute: 0.8 10*3/uL (ref 0.1–0.9)
Monocytes Relative: 11 %
Neutro Abs: 4 10*3/uL (ref 1.5–6.5)
Neutrophils Relative %: 56 %
Platelet Count: 390 10*3/uL (ref 145–400)
RBC: 4.13 MIL/uL (ref 3.70–5.45)
RDW: 14.3 % (ref 11.2–14.5)
WBC Count: 7.2 10*3/uL (ref 3.9–10.3)

## 2017-06-21 LAB — COMPREHENSIVE METABOLIC PANEL
ALT: 9 U/L (ref 0–55)
AST: 14 U/L (ref 5–34)
Albumin: 3.8 g/dL (ref 3.5–5.0)
Alkaline Phosphatase: 96 U/L (ref 40–150)
Anion gap: 7 (ref 3–11)
BUN: 10 mg/dL (ref 7–26)
CO2: 28 mmol/L (ref 22–29)
Calcium: 9 mg/dL (ref 8.4–10.4)
Chloride: 106 mmol/L (ref 98–109)
Creatinine, Ser: 0.82 mg/dL (ref 0.60–1.10)
GFR calc Af Amer: >60 mL/min (ref >60)
GFR calc non Af Amer: >60 mL/min (ref >60)
Glucose, Bld: 74 mg/dL (ref 70–140)
Potassium: 3.9 mmol/L (ref 3.5–5.1)
Sodium: 141 mmol/L (ref 136–145)
Total Bilirubin: 0.7 mg/dL (ref 0.2–1.2)
Total Protein: 7.7 g/dL (ref 6.4–8.3)

## 2017-06-21 LAB — RETICULOCYTES
RBC.: 4.13 MIL/uL (ref 3.70–5.45)
Retic Count, Absolute: 37.2 10*3/uL (ref 33.7–90.7)
Retic Ct Pct: 0.9 % (ref 0.7–2.1)

## 2017-07-12 ENCOUNTER — Other Ambulatory Visit: Payer: Self-pay

## 2017-07-12 DIAGNOSIS — M5412 Radiculopathy, cervical region: Secondary | ICD-10-CM

## 2017-07-12 MED ORDER — GABAPENTIN 100 MG PO CAPS: ORAL_CAPSULE | ORAL | 3 refills | Status: DC

## 2017-07-15 NOTE — Progress Notes (Signed)
[facility] Follow-up Visit:  Assessment: Anemia 57 year old female with minimal but potentially progressive normocytic normochromic anemia that appears to have developed sometime earlier this year. Stool was negative for presence of blood which was very appropriate to be tested in the setting of antiplatelet therapy at the patient is currently receiving. Our additional evaluation revealed serology for H. pylori which may be the explanation of mild anemia and thrombocytosis.   Interval assessment reveals no new complaints and stable hemoglobin values with minimal degree of anemia which remains normocytic and normochromic.  Stool testing returned negative for presence of H. pylori suggestive of previous exposure/infection which is not currently active.  Plan:  -Continue hematological monitoring by the primary care provider with labs obtained every 6 months. -Return to our clinic if progressive anemia is noted.    No orders of the defined types were placed in this encounter.   All questions were answered.  . The patient knows to call the clinic with any problems, questions or concerns.  This note was electronically signed.    History of Presenting Illness Deanna [name] 57 y.o. Deanna [name] 57 y.o. followed in the [place] for evaluation of anemia. Patient was initially noted to be anemic in May 2018, when the hemoglobin level returned same in June, patient was referred for consultation after fecal occult blood test was demonstrated to be negative. Previous medical history is significant for an event described as TTP during her pregnancy ears ago. As per the patient, she was treated with asthma exchange and splenectomy. She denies previous history of bleeding or clotting disorders. She does have history of gastroesophageal reflux, lactose intolerance, egg intolerance with rolling in the fingers and ankles and intolerance of red meat with feeling of bloating  over the past 3 years.  Patient denies any fever, chills or night sweats. She denies any unexpected weight loss. No respiratory symptoms. No current nausea, early satiety, abdominal pain, diarrhea, or constipation. Patient does describe a period of easy bruisability after starting clopidogrel in March 2018 following a "mini stroke". The bruising has since resolved although the patient is remaining on the medication. Patient denies any new urinary or urological symptoms.  Patient underwent lab work, and returns to the clinic to review the findings. Patient denies any new complaints since last visit to the clinic.  Oncological/hematological History: --Labs, 10/04/16: WBC 7.2, Hgb 12.2, MCV 85.7, MCH     ..., RDW 14.3, Plt 417; --Labs, 12/02/15: WBC 7.1, Hgb 12.0, MCV 86.0, MCH     ..., RDW 13.9, Plt 422; --Labs, 07/18/16: WBC 7.2, Hgb 12.1, MCV 86.9, MCH     ..., RDW 14.1, Plt 454; TSH 1.33 --Labs, 10/05/16: WBC 7.2, Hgb 11.7, MCV 86.5, MCH     ..., RDW 14.2, Plt 407; FOBT -- negative x5 --Labs, 10/18/16: WBC 6.9, Hgb 11.7, MCV 86.1, MCH     ..., RDW 14.2, Plt 434; --Labs, 12/12/16: WBC 7.1, Hgb 11.6, MCV 89.4, MCH 28.5, RDW 14.6, Plt 415; H pylori Ab IgA & IgG positive --Labs, 06/21/17: WBC 7.2, Hgb 11.8,              Plt 390;   Medical History: Past Medical History:  Diagnosis Date  . Allergy   . GERD (gastroesophageal reflux disease)    "does not need meds anymore"  . Heart murmur   . Hyperlipidemia   . Lactose intolerance   . TTP (thrombotic thrombocytopenic purpura) [facility])     Surgical History: Past Surgical  History:  Procedure Laterality Date  . ABDOMINAL HYSTERECTOMY  2006   benign tumor, complete  . KNEE ARTHROSCOPY Right   . NASAL SEPTUM SURGERY    . 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 Arthritis Mother   . Hyperlipidemia Mother   . Stroke Mother   . Hypertension Mother   . Colon cancer Neg  Hx     Social History: Social History   Socioeconomic History  . Marital status: Married    Spouse name: Not on file  . Number of children: Not on file  . Years of education: Not on file  . Highest education level: Not on file  Social Needs  . Financial resource strain: Not on file  . Food insecurity - worry: Not on file  . Food insecurity - inability: Not on file  . Transportation needs - medical: Not on file  . Transportation needs - non-medical: Not on file  Occupational History  . Not on file  Tobacco Use  . Smoking status: Never Smoker  . Smokeless tobacco: Never Used  Substance and Sexual Activity  . Alcohol use: No  . Drug use: No  . Sexual activity: Not on file  Other Topics Concern  . 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 Eggs Or Egg-Derived Products Nausea And Vomiting  . Lactose Intolerance (Gi) Other (See Comments)    vomiting    Medications:  Current Outpatient Medications  Medication Sig Dispense Refill  . clopidogrel (PLAVIX) 75 MG tablet Take 1 tablet (75 mg total) by mouth daily. 30 tablet 11  . gabapentin (NEURONTIN) 100 MG capsule Take 2 capsules at night 180 capsule 3  . pravastatin (PRAVACHOL) 40 MG tablet Take 1 tablet (40 mg total) by mouth daily. 90 tablet 0  . triamcinolone cream (KENALOG) 0.1 % Apply 1 application topically 2 (two) times daily. (Patient not taking: Reported on 06/21/2017) 30 g 1   No current facility-administered medications for this visit.     Review of Systems: Review of Systems  All other systems reviewed and are negative.    PHYSICAL EXAMINATION Blood pressure (!) 141/87, pulse 64, temperature 98.9 F (37.2 C), temperature source Oral,  resp. rate 18, height '5\' 9"'  (1.753 m), weight 223 lb 11.2 oz (101.5 kg), SpO2 100 %.  ECOG PERFORMANCE STATUS: 1 - Symptomatic but completely ambulatory  Physical Exam  Constitutional: She is oriented to person, place, and time and well-developed, well-nourished, and in no distress. No distress.  HENT:  Head: Normocephalic and atraumatic.  Mouth/Throat: Oropharynx is clear and moist. No oropharyngeal exudate.  Eyes: Conjunctivae and EOM are normal. Pupils are equal, round, and reactive to light. No scleral icterus.  Neck: Normal range of motion.  Cardiovascular: Normal rate and regular rhythm. Exam reveals no gallop.  P5/X4, and an diastolic murmur best heard over the pulmonary valve projection.  Pulmonary/Chest: Effort normal and breath sounds normal. She has no wheezes. She has no rales.  Abdominal: Soft. She exhibits no distension and no mass. There is  no tenderness. There is no rebound and no guarding.  Musculoskeletal: She exhibits no edema.  Lymphadenopathy:    She has no cervical adenopathy.  Neurological: She is alert and oriented to person, place, and time. She has normal reflexes.  Skin: Skin is warm and dry. No rash noted. She is not diaphoretic. No erythema. No pallor.     LABORATORY DATA: I have personally reviewed the data as listed: Appointment on 06/21/2017  Component Date Value Ref Range Status  . Sodium 06/21/2017 141  136 - 145 mmol/L Final  . Potassium 06/21/2017 3.9  3.5 - 5.1 mmol/L Final  . Chloride 06/21/2017 106  98 - 109 mmol/L Final  . CO2 06/21/2017 28  22 - 29 mmol/L Final  . Glucose, Bld 06/21/2017 74  70 - 140 mg/dL Final  . BUN 06/21/2017 10  7 - 26 mg/dL Final  . Creatinine, Ser 06/21/2017 0.82  0.60 - 1.10 mg/dL Final  . Calcium 06/21/2017 9.0  8.4 - 10.4 mg/dL Final  . Total Protein 06/21/2017 7.7  6.4 - 8.3 g/dL Final  . Albumin 06/21/2017 3.8  3.5 - 5.0 g/dL Final  . AST 06/21/2017 14  5 - 34 U/L Final  . ALT 06/21/2017 9  0 - 55 U/L Final  .  Alkaline Phosphatase 06/21/2017 96  40 - 150 U/L Final  . Total Bilirubin 06/21/2017 0.7  0.2 - 1.2 mg/dL Final  . GFR calc non Af Amer 06/21/2017 >60  >60 mL/min Final  . GFR calc Af Amer 06/21/2017 >60  >60 mL/min Final   Comment: (NOTE) The eGFR has been calculated using the CKD EPI equation. This calculation has not been validated in all clinical situations. eGFR's persistently <60 mL/min signify possible Chronic Kidney Disease.   Georgiann Hahn gap 06/21/2017 7  3 - 11 Final   Performed at [facility], [place] [address], [place], [place] [address]  . WBC Count 06/21/2017 7.2  3.9 - 10.3 K/uL Final  . RBC 06/21/2017 4.13  3.70 - 5.45 MIL/uL Final  . Hemoglobin 06/21/2017 11.8  11.6 - 15.9 g/dL Final  . HCT 06/21/2017 36.5  34.8 - 46.6 % Final  . MCV 06/21/2017 88.4  79.5 - 101.0 fL Final  . MCH 06/21/2017 28.6  25.1 - 34.0 pg Final  . MCHC 06/21/2017 32.3  31.5 - 36.0 g/dL Final  . RDW 06/21/2017 14.3  11.2 - 14.5 % Final  . Platelet Count 06/21/2017 390  145 - 400 K/uL Final  . Neutrophils Relative % 06/21/2017 56  % Final  . Neutro Abs 06/21/2017 4.0  1.5 - 6.5 K/uL Final  . Lymphocytes Relative 06/21/2017 30  % Final  . Lymphs Abs 06/21/2017 2.2  0.9 - 3.3 K/uL Final  . Monocytes Relative 06/21/2017 11  % Final  . Monocytes Absolute 06/21/2017 0.8  0.1 - 0.9 K/uL Final  . Eosinophils Relative 06/21/2017 2  % Final  . Eosinophils Absolute 06/21/2017 0.1  0.0 - 0.5 K/uL Final  . Basophils Relative 06/21/2017 1  % Final  . Basophils Absolute 06/21/2017 0.1  0.0 - 0.1 K/uL Final   Performed at [facility], [place] [address] [place], [place] [address]  . Retic Ct Pct 06/21/2017 0.9  0.7 - 2.1 % Final  . RBC. 06/21/2017 4.13  3.70 - 5.45 MIL/uL Final  . Retic Count, Absolute 06/21/2017 37.2  33.7 - 90.7 K/uL Final   Performed at [facility], [place]  [address] [place], [place] [address]       Ardath Sax,  MD

## 2017-07-15 NOTE — Assessment & Plan Note (Signed)
57 year old female with minimal but potentially progressive normocytic normochromic anemia that appears to have developed sometime earlier this year. Stool was negative for presence of blood which was very appropriate to be tested in the setting of antiplatelet therapy at the patient is currently receiving. Our additional evaluation revealed serology for H. pylori which may be the explanation of mild anemia and thrombocytosis.   Interval assessment reveals no new complaints and stable hemoglobin values with minimal degree of anemia which remains normocytic and normochromic.  Stool testing returned negative for presence of H. pylori suggestive of previous exposure/infection which is not currently active.  Plan:  -Continue hematological monitoring by the primary care provider with labs obtained every 6 months. -Return to our clinic if progressive anemia is noted.

## 2017-07-24 ENCOUNTER — Other Ambulatory Visit: Payer: Self-pay

## 2017-07-24 ENCOUNTER — Encounter: Payer: Self-pay | Admitting: Neurology

## 2017-07-24 ENCOUNTER — Ambulatory Visit (INDEPENDENT_AMBULATORY_CARE_PROVIDER_SITE_OTHER): Payer: Managed Care, Other (non HMO) | Admitting: Neurology

## 2017-07-24 VITALS — BP 130/78 | HR 69 | Ht 69.0 in | Wt 222.0 lb

## 2017-07-24 DIAGNOSIS — R9089 Other abnormal findings on diagnostic imaging of central nervous system: Secondary | ICD-10-CM

## 2017-07-24 DIAGNOSIS — M5412 Radiculopathy, cervical region: Secondary | ICD-10-CM

## 2017-07-24 DIAGNOSIS — R200 Anesthesia of skin: Secondary | ICD-10-CM

## 2017-07-24 DIAGNOSIS — R202 Paresthesia of skin: Secondary | ICD-10-CM

## 2017-07-24 MED ORDER — CLOPIDOGREL BISULFATE 75 MG PO TABS: 75.0000 mg | ORAL_TABLET | Freq: Every day | ORAL | 3 refills | Status: DC

## 2017-07-24 MED ORDER — GABAPENTIN 100 MG PO CAPS: ORAL_CAPSULE | ORAL | 3 refills | Status: DC

## 2017-07-24 NOTE — Patient Instructions (Signed)
1. Increase gabapentin 100mg : Take 3 caps at night 2. Continue Plavix 75mg  daily 3. Try home neck exercises. If no change, call our office and we will send another referral for physical therapy 4. Follow-up in 1 year, call for any changes

## 2017-07-24 NOTE — Progress Notes (Signed)
NEUROLOGY FOLLOW UP OFFICE NOTE  Deanna [name] [id number] 1960/07/24  HISTORY OF PRESENT ILLNESS: I had the pleasure of seeing Deanna [name] in follow-up in the neurology clinic on 07/24/2017.  The patient was last seen 55 months ago after she had an episode of left hand numbness and tingling, as well as reporting a 3-year history of intermittent dizzy spells. Her MRI brain in March 2018 showed a faint punctate focus of reduced diffusion in the right frontal lobe deep white matter, possibly due to acute ischemia or hyperacute demyelination. There were scattered FLAIR hyperintensities seen bilaterally. Stroke workup unremarkable, she is taking Plavix 75mg  daily without side effects. Repeat MRI brain in September 2018 did not show any acute changes, there was re-demonstration of right parietal cortical to subcortical white matter T2 hyperintensity and scattered FLAIR changes bilaterally. MRI cervical and thoracic spine did not show any demyelinating lesions, there was note of disc bulges possibly irritating left C5 nerve root, likely the cause of left arm symptoms. She underwent PT and reports that the arm symptoms have mostly resolved, she is taking gabapentin 200mg  qhs. She has occasional sensations on her left arm but it does not progress to before. She denies any further dizziness. She has had a crick in her neck since Sunday, with difficulty turning her head. She has also started noticing cramps in her toes. She denies any  headaches, bowel/bladder dysfunction, vision changes, no falls. She reports a lot of stress taking care of her mother with Alzheimer's disease.  HPI 07/20/2016: This is a pleasant 57 yo RH woman with a history of hyperlipidemia who reported a 3-year history of intermittent episodes of dizziness described as spinning, as well as feeling lightheaded with blurred vision lasting 20-30 seconds. Recently she started having different symptoms, around 2 weeks ago she started getting  carsick, which is new for her. A week ago, her left hand was numb, "like it was not there," then 20-30 seconds later it started tingling. Face and leg were unaffected. She did not have any neck pain at that time, but 3 days later started having a headache with pressure over the frontal regions that she ascribed to her sinuses. She started having pretty significant intermittent nausea. She continued to have dizziness, last episode was a week ago. The headaches have resolved. Blurred vision still comes and goes. She denies any diplopia, dysarthria/dysphagia, back pain, bowel/bladder dysfunction, negative Lhermitte sign. She denies any prior history of focal symptoms, no prior history of vision loss. She denies any head injuries. 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 There is a family history of stroke.  She had an MRI brain with and without contrast last 07/18/2016 which I personally reviewed, there was a faint punctate focus of reduced diffusion in the right frontal lobe deep white matter, difficult to see on the ADC map due to small size, this can be seen with acute ischemia or hyperacute demyelination. There were at least 5 additional subcentimeter white matter, right parietal, medial left thalamus, and left occipital horn FLAIR hyperintensities. There was mild prominence of the ventricles and sulci for age. No abnormal enhancement seen. 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   PAST MEDICAL HISTORY: Past Medical History:  Diagnosis Date  . Allergy   . GERD (gastroesophageal reflux disease)    "does not need meds anymore"  . Heart murmur   . Hyperlipidemia   . Lactose intolerance   .  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HCC)     MEDICATIONS: Current Outpatient Medications on File Prior to Visit  Medication Sig Dispense Refill  . clopidogrel (PLAVIX) 75 MG tablet Take 1 tablet (75 mg total) by mouth daily.  30 tablet 11  . gabapentin (NEURONTIN) 100 MG capsule Take 2 capsules at night 180 capsule 3  . pravastatin (PRAVACHOL) 40 MG tablet Take 1 tablet (40 mg total) by mouth daily. 90 tablet 0  . triamcinolone cream (KENALOG) 0.1 % Apply 1 application topically 2 (two) times daily. 30 g 1   No current facility-administered medications on file prior to visit.     ALLERGIES: Allergies  Allergen Reactions  . Eggs Or Egg-Derived Products Nausea And Vomiting  . Lactose Intolerance (Gi) Other (See Comments)    vomiting    FAMILY HISTORY: Family History  Problem Relation Age of Onset  . Arthritis Mother   . Hyperlipidemia Mother   . Stroke Mother   . Hypertension Mother   . Colon cancer Neg Hx     SOCIAL HISTORY: Social History   Socioeconomic History  . Marital status: Married    Spouse name: Not on file  . Number of children: Not on file  . Years of education: Not on file  . Highest education level: Not on file  Social Needs  . Financial resource strain: Not on file  . Food insecurity - worry: Not on file  . Food insecurity - inability: Not on file  . Transportation needs - medical: Not on file  . Transportation needs - non-medical: Not on file  Occupational History  . Not on file  Tobacco Use  . Smoking status: Never Smoker  . Smokeless tobacco: Never Used  Substance and Sexual Activity  . Alcohol use: No  . Drug use: No  . Sexual activity: Not on file  Other Topics Concern  . 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: lives 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, husband and mother      Spiritual Beliefs: Christian      Lifestyle: walks a little a few days per week, diet is ok             REVIEW OF SYSTEMS: Constitutional: No fevers, chills, or sweats, no generalized fatigue, change in appetite Eyes: No visual changes, double vision, eye pain Ear, nose and throat: No hearing loss, ear pain, nasal congestion,  sore throat Cardiovascular: No chest pain, palpitations Respiratory:  No shortness of breath at rest or with exertion, wheezes GastrointestinaI: No nausea, vomiting, diarrhea, abdominal pain, fecal incontinence Genitourinary:  No dysuria, urinary retention or frequency Musculoskeletal:  + neck pain, back pain Integumentary: No rash, pruritus, skin lesions Neurological: as above Psychiatric: No depression, insomnia, anxiety Endocrine: No palpitations, fatigue, diaphoresis, mood swings, change in appetite, change in weight, increased thirst Hematologic/Lymphatic:  No anemia, purpura, petechiae. Allergic/Immunologic: no itchy/runny eyes, nasal congestion, recent allergic reactions, rashes  PHYSICAL EXAM: Vitals:   07/24/17 1115  BP: 130/78  Pulse: 69  SpO2: 98%   General: No acute distress Head:  Normocephalic/atraumatic Neck: supple, no paraspinal tenderness, full range of motion Heart:  Regular rate and rhythm Lungs:  Clear to auscultation bilaterally Back: No paraspinal tenderness Skin/Extremities: No rash, no edema Neurological Exam: alert and oriented to person, place, and time. No aphasia or dysarthria. Fund of knowledge is appropriate.  Recent and remote memory are intact.  Attention and concentration are  normal.    Able to name objects and repeat phrases. Cranial nerves: Pupils equal, round, reactive to light.  Extraocular movements intact with no nystagmus. Visual fields full. Facial sensation intact. No facial asymmetry. Tongue, uvula, palate midline.  Motor: Bulk and tone normal, muscle strength 5/5 throughout with no pronator drift.  Sensation to light touch intact.  No extinction to double simultaneous stimulation.  Deep tendon reflexes +1 throughout, toes downgoing.  Finger to nose testing intact.  Gait narrow-based and steady, able to tandem walk adequately.  Romberg negative.  IMPRESSION: This is a 57 yo RH woman with a history of hyperlipidemia, who presented initially  with a 3-year history of intermittent dizziness suggestive of vertigo that had worsened with nausea, headaches, and a transient episode of left hand numbness and tingling. She denies any further dizziness or headaches. She continued to report occasional numbness and tingling in her left hand. MRI brain had shown a faint punctate focus of reduced diffusion in the right frontal lobe deep white matter, difficult to see on the ADC map due to small size, this can be seen with acute ischemia or hyperacute demyelination. This seemed to correlate with her left hand symptoms, however she also has left C5 radiculopathy on her cervical MRI. She is taking Plavix for secondary stroke prevention. Continue control of vascular risk factors. The left hand symptoms are improved after PT, she still has mild sensations and will try to increase gabapentin to 300mg  qhs. She has neck pain again and will do HEP but if ineffective, will be referred for another course of PT. She has an abnormal brain MRI with white matter changes possibly due to chronic ischemia versus chronic demyelination, MRI spine did not show any demyelinating lesions. Continue to monitor clinically, she does not fit MS criteria at this point. She knows to go to the ER immediately for any change in symptoms. She will follow-up in 1 year and knows to call for any changes.   Thank you for allowing me to participate in her care.  Please do not hesitate to call for any questions or concerns.  The duration of this appointment visit was 25 minutes of face-to-face time with the patient.  Greater than 50% of this time was spent in counseling, explanation of diagnosis, planning of further management, and coordination of care.   Ellouise Newer, M.D.   CC: Dr. Maudie Mercury

## 2017-08-14 ENCOUNTER — Other Ambulatory Visit: Payer: Self-pay | Admitting: Family Medicine

## 2017-09-06 ENCOUNTER — Other Ambulatory Visit: Payer: Self-pay | Admitting: Family Medicine

## 2017-10-02 ENCOUNTER — Other Ambulatory Visit: Payer: Self-pay | Admitting: Family Medicine

## 2017-10-18 ENCOUNTER — Encounter: Payer: Managed Care, Other (non HMO) | Admitting: Family Medicine

## 2017-10-26 ENCOUNTER — Other Ambulatory Visit: Payer: Self-pay | Admitting: Family Medicine

## 2017-11-23 ENCOUNTER — Other Ambulatory Visit: Payer: Self-pay | Admitting: Family Medicine

## 2017-12-11 ENCOUNTER — Encounter (HOSPITAL_COMMUNITY): Payer: Self-pay

## 2017-12-11 ENCOUNTER — Ambulatory Visit (INDEPENDENT_AMBULATORY_CARE_PROVIDER_SITE_OTHER): Payer: Managed Care, Other (non HMO) | Admitting: Family Medicine

## 2017-12-11 ENCOUNTER — Ambulatory Visit: Payer: Self-pay | Admitting: *Deleted

## 2017-12-11 ENCOUNTER — Ambulatory Visit (HOSPITAL_COMMUNITY)
Admission: RE | Admit: 2017-12-11 | Discharge: 2017-12-11 | Disposition: A | Discharge status: Home / Self Care | Payer: Managed Care, Other (non HMO) | Source: Ambulatory Visit | Attending: Family Medicine | Admitting: Family Medicine

## 2017-12-11 ENCOUNTER — Encounter: Payer: Self-pay | Admitting: Family Medicine

## 2017-12-11 ENCOUNTER — Telehealth: Payer: Self-pay | Admitting: *Deleted

## 2017-12-11 VITALS — BP 120/82 | HR 65 | Temp 98.2°F | Ht 69.0 in | Wt 219.8 lb

## 2017-12-11 DIAGNOSIS — R600 Localized edema: Secondary | ICD-10-CM

## 2017-12-11 NOTE — Progress Notes (Unsigned)
Today's left lower extremity venous duplex is negative for DVT. Preliminary results given to Dr. Maudie Mercury at 2:00.

## 2017-12-11 NOTE — Telephone Encounter (Signed)
-----   Message from Lucretia Kern, DO sent at 12/11/2017  2:10 PM EDT ----- Regarding: FW: Left Lower Extremity Venous Duplex Please let her know no blood clot according to the radiology department! So, recommend sports med or ortho eval for other causes, particularly if persists. Ok to refer if she wishes. ----- Message ----- From: Charlaine Dalton, RVT Sent: 12/11/2017   2:00 PM To: Lucretia Kern, DO Subject: Left Lower Extremity Venous Duplex             Today's left lower extremity venous duplex is negative for DVT.

## 2017-12-11 NOTE — Progress Notes (Addendum)
HPI:  Using dictation device. Unfortunately this device frequently misinterprets words/phrases.  Acute visit for L calf pain: -started acutely last week (about 5 days ago) -long car ride to [place] in last few weeks and did notice some swelling L leg after return -no known inciting injury or new activities -calf pain, sharp a times -swelling the last few days more prominent L LE and ankle -denies: knee pain, ankle pain, ant leg pain, fevers, malaise, SOB, DOE -hx TTP in pregnancy, hx ? Stroke on plavix with neurology  ROS: See pertinent positives and negatives per HPI.  Past Medical History:  Diagnosis Date  . Allergy   . GERD (gastroesophageal reflux disease)    "does not need meds anymore"  . Heart murmur   . Hyperlipidemia   . Lactose intolerance   . 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HCC)     Past Surgical History:  Procedure Laterality Date  . ABDOMINAL HYSTERECTOMY  2006   benign tumor, complete  . KNEE ARTHROSCOPY Right   . NASAL SEPTUM SURGERY    . 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 Arthritis Mother   . Hyperlipidemia Mother   . Stroke Mother   . Hypertension Mother   . Colon cancer Neg Hx     SOCIAL HX: see hpi   Current Outpatient Medications:  .  clopidogrel (PLAVIX) 75 MG tablet, Take 1 tablet (75 mg total) by mouth daily., Disp: 90 tablet, Rfl: 3 .  gabapentin (NEURONTIN) 100 MG capsule, Take 3 capsules at night, Disp: 270 capsule, Rfl: 3 .  pravastatin (PRAVACHOL) 40 MG tablet, TAKE 1 TABLET BY MOUTH EVERY DAY, Disp: 30 tablet, Rfl: 0 .  triamcinolone cream (KENALOG) 0.1 %, Apply 1 application topically 2 (two) times daily., Disp: 30 g, Rfl: 1  EXAM:  Vitals:   12/11/17 1116  BP: 120/82  Pulse: 65  Temp: 98.2 F (36.8 C)  SpO2: 98%    Body mass index is 32.46 kg/m.  GENERAL: vitals reviewed and listed above, alert, oriented, appears well hydrated and in no acute  distress  HEENT: atraumatic, conjunttiva clear, no obvious abnormalities on inspection of external nose and ears  NECK: no obvious masses on inspection  LUNGS: clear to auscultation bilaterally, no wheezes, rales or rhonchi, good air movement  CV: HRRR, L lower ext edema to knee, more prominent in ankle without discoloration or appreciable warmth, TTP over calf and mid post thigh in regions of deep veins, though no ropiness appreciated, normal pedal pulses  MS: moves all extremities without noticeable abnormality, TTP L post calf most prominent, no firm calf swelling, normal ROM knee and ankle  PSYCH: pleasant and cooperative, no obvious depression or anxiety  ASSESSMENT AND PLAN:  Discussed the following assessment and plan:  Leg edema - Plan: VAS [place] LOWER EXTREMITY VENOUS (DVT) More than 50% of over 25 minutes spent in total in caring for this patient was spent face-to-face with the patient, counseling and/or coordinating care.   -will get stat LE duplex to r/o DVT - I will be out of office tomorrow so will sign out to another provider and my assistant to follow up on results if not back today - if DVT and stable would suggest coordination with neurology given on plavix and or urgent hematology or CV referral to assist in management given hx and on plavix -if no DVT query strain/sprain, musculoskeletal issue and would tx with heat, elevation,  gentle stretching and consideration sports med or ortho referral if not improving -Patient advised to return or notify a doctor immediately if symptoms worsen or persist or new concerns arise.  Patient Instructions  BEFORE YOU LEAVE: -stat LE duplex -dermatology info -follow up: as needed and pending tests for this issue  Get the ultrasound of the leg.  See emergency care if fevers, difficulty breathing, chest pain or worsening in the interim.    Lucretia Kern, DO

## 2017-12-11 NOTE — Telephone Encounter (Signed)
I left a detailed message with the information below at the pts cell number and asked that she call back if she would like a referral.

## 2017-12-11 NOTE — Telephone Encounter (Signed)
The pt called with complaints of swelling in her left leg(everything below the left knee); she also has had cramps in this area; she states that it feels like "something has pulled, and it is really sore"; nurse triage initiated and recommendations made per protocol to include seeing a physician within 24 hours; the pt normally sees Dr Maudie Mercury but this provider has no availability within guidelines set per protocol; pt offered and accepted appointment with Dorothyann Peng, [facility], today at [phone]; will route to office for notification of this upcoming appointment.    Reason for Disposition . [1] MODERATE leg swelling (e.g., swelling extends up to knees) AND [2] new onset or worsening  Answer Assessment - Initial Assessment Questions 1. ONSET: "When did the swelling start?" (e.g., minutes, hours, days)     12/07/17 2. LOCATION: "What part of the leg is swollen?"  "Are both legs swollen or just one leg?" Left leg (everthing below the knee; ankle is also swollen) 3. SEVERITY: "How bad is the swelling?" (e.g., localized; mild, moderate, severe)  - Localized - small area of swelling localized to one leg  - MILD pedal edema - swelling limited to foot and ankle, pitting edema < 1/4 inch (6 mm) deep, rest and elevation eliminate most or all swelling  - MODERATE edema - swelling of lower leg to knee, pitting edema > 1/4 inch (6 mm) deep, rest and elevation only partially reduce swelling  - SEVERE edema - swelling extends above knee, facial or hand swelling present      Moderate, but does feel better if she elevates it   4. REDNESS: "Does the swelling look red or infected?"     no 5. PAIN: "Is the swelling painful to touch?" If so, ask: "How painful is it?"   (Scale 1-10; mild, moderate or severe)     Pain from soreness (pt thought cramps had caused soreness) 6. FEVER: "Do you have a fever?" If so, ask: "What is it, how was it measured, and when did it start?"      no 7. CAUSE: "What do you think is  causing the leg swelling?"     unsure 8. MEDICAL HISTORY: "Do you have a history of heart failure, kidney disease, liver failure, or cancer?"     no 9. RECURRENT SYMPTOM: "Have you had leg swelling before?" If so, ask: "When was the last time?" "What happened that time?"    Ankle swelling 11/15/17 after riding for a while 10. OTHER SYMPTOMS: "Do you have any other symptoms?" (e.g., chest pain, difficulty breathing)       no 11. PREGNANCY: "Is there any chance you are pregnant?" "When was your last menstrual period?"       No post menopausal  Protocols used: LEG SWELLING AND EDEMA-A-AH

## 2017-12-11 NOTE — Patient Instructions (Signed)
BEFORE YOU LEAVE: -stat LE duplex -dermatology info -follow up: as needed and pending tests for this issue  Get the ultrasound of the leg.  See emergency care if fevers, difficulty breathing, chest pain or worsening in the interim.

## 2017-12-11 NOTE — Telephone Encounter (Signed)
I confirmed patient is on the schedule this morning to see [facility].

## 2017-12-12 ENCOUNTER — Other Ambulatory Visit: Payer: Self-pay | Admitting: Family Medicine

## 2018-01-17 ENCOUNTER — Other Ambulatory Visit: Payer: Self-pay | Admitting: Family Medicine

## 2018-01-17 ENCOUNTER — Encounter: Payer: Self-pay | Admitting: Family Medicine

## 2018-01-17 ENCOUNTER — Ambulatory Visit (INDEPENDENT_AMBULATORY_CARE_PROVIDER_SITE_OTHER): Payer: Managed Care, Other (non HMO) | Admitting: Family Medicine

## 2018-01-17 VITALS — BP 124/82 | HR 66 | Temp 98.3°F | Ht 68.25 in | Wt 221.7 lb

## 2018-01-17 DIAGNOSIS — E785 Hyperlipidemia, unspecified: Secondary | ICD-10-CM | POA: Diagnosis not present

## 2018-01-17 DIAGNOSIS — R21 Rash and other nonspecific skin eruption: Secondary | ICD-10-CM

## 2018-01-17 DIAGNOSIS — Z0000 Encounter for general adult medical examination without abnormal findings: Secondary | ICD-10-CM | POA: Diagnosis not present

## 2018-01-17 DIAGNOSIS — Z111 Encounter for screening for respiratory tuberculosis: Secondary | ICD-10-CM | POA: Diagnosis not present

## 2018-01-17 DIAGNOSIS — E669 Obesity, unspecified: Secondary | ICD-10-CM

## 2018-01-17 DIAGNOSIS — R739 Hyperglycemia, unspecified: Secondary | ICD-10-CM | POA: Diagnosis not present

## 2018-01-17 DIAGNOSIS — Z1331 Encounter for screening for depression: Secondary | ICD-10-CM | POA: Diagnosis not present

## 2018-01-17 MED ORDER — PRAVASTATIN SODIUM 40 MG PO TABS: 40.0000 mg | ORAL_TABLET | Freq: Every day | ORAL | 3 refills | Status: DC

## 2018-01-17 MED ORDER — TRIAMCINOLONE ACETONIDE 0.1 % EX CREA: 1.0000 "application " | TOPICAL_CREAM | Freq: Two times a day (BID) | CUTANEOUS | 1 refills | Status: DC

## 2018-01-17 NOTE — Patient Instructions (Signed)
BEFORE YOU LEAVE: -labs -hold forms to complete and return after labs done -follow up: 3-4 months  We have ordered labs or studies at this visit. It can take up to 1-2 weeks for results and processing. IF results require follow up or explanation, we will call you with instructions. Clinically stable results will be released to your [facility]. If you have not heard from [place] or cannot find your results in [facility] in 2 weeks please contact our office at [phone].  If you are not yet signed up for [facility], please consider signing up.   Preventive Care 40-64 Years, Female Preventive care refers to lifestyle choices and visits with your health care provider that can promote health and wellness. What does preventive care include?  A yearly physical exam. This is also called an annual well check.  Dental exams once or twice a year.  Routine eye exams. Ask your health care provider how often you should have your eyes checked.  Personal lifestyle choices, including: ? Daily care of your teeth and gums. ? Regular physical activity. ? Eating a healthy diet. ? Avoiding tobacco and drug use. ? Limiting alcohol use. ? Practicing safe sex. ? Taking low-dose aspirin daily starting at age 81. ? Taking vitamin and mineral supplements as recommended by your health care provider. What happens during an annual well check? The services and screenings done by your health care provider during your annual well check will depend on your age, overall health, lifestyle risk factors, and family history of disease. Counseling Your health care provider may ask you questions about your:  Alcohol use.  Tobacco use.  Drug use.  Emotional well-being.  Home and relationship well-being.  Sexual activity.  Eating habits.  Work and work Statistician.  Method of birth control.  Menstrual cycle.  Pregnancy history.  Screening You may have the following tests or measurements:  Height, weight, and  BMI.  Blood pressure.  Lipid and cholesterol levels. These may be checked every 5 years, or more frequently if you are over 38 years old.  Skin check.  Lung cancer screening. You may have this screening every year starting at age 15 if you have a 30-pack-year history of smoking and currently smoke or have quit within the past 15 years.  Fecal occult blood test (FOBT) of the stool. You may have this test every year starting at age 61.  Flexible sigmoidoscopy or colonoscopy. You may have a sigmoidoscopy every 5 years or a colonoscopy every 10 years starting at age 27.  Hepatitis C blood test.  Hepatitis B blood test.  Sexually transmitted disease (STD) testing.  Diabetes screening. This is done by checking your blood sugar (glucose) after you have not eaten for a while (fasting). You may have this done every 1-3 years.  Mammogram. This may be done every 1-2 years. Talk to your health care provider about when you should start having regular mammograms. This may depend on whether you have a family history of breast cancer.  BRCA-related cancer screening. This may be done if you have a family history of breast, ovarian, tubal, or peritoneal cancers.  Pelvic exam and Pap test. This may be done every 3 years starting at age 79. Starting at age 81, this may be done every 5 years if you have a Pap test in combination with an HPV test.  Bone density scan. This is done to screen for osteoporosis. You may have this scan if you are at high risk for osteoporosis.  Discuss  your test results, treatment options, and if necessary, the need for more tests with your health care provider. Vaccines Your health care provider may recommend certain vaccines, such as:  Influenza vaccine. This is recommended every year.  Tetanus, diphtheria, and acellular pertussis (Tdap, Td) vaccine. You may need a Td booster every 10 years.  Varicella vaccine. You may need this if you have not been vaccinated.  Zoster  vaccine. You may need this after age 29.  Measles, mumps, and rubella (MMR) vaccine. You may need at least one dose of MMR if you were born in 1957 or later. You may also need a second dose.  Pneumococcal 13-valent conjugate (PCV13) vaccine. You may need this if you have certain conditions and were not previously vaccinated.  Pneumococcal polysaccharide (PPSV23) vaccine. You may need one or two doses if you smoke cigarettes or if you have certain conditions.  Meningococcal vaccine. You may need this if you have certain conditions.  Hepatitis A vaccine. You may need this if you have certain conditions or if you travel or work in places where you may be exposed to hepatitis A.  Hepatitis B vaccine. You may need this if you have certain conditions or if you travel or work in places where you may be exposed to hepatitis B.  Haemophilus influenzae type b (Hib) vaccine. You may need this if you have certain conditions.  Talk to your health care provider about which screenings and vaccines you need and how often you need them. This information is not intended to replace advice given to you by your health care provider. Make sure you discuss any questions you have with your health care provider. Document Released: 05/28/2015 Document Revised: 01/19/2016 Document Reviewed: 03/02/2015 Elsevier Interactive Patient Education  [employer].

## 2018-01-17 NOTE — Progress Notes (Signed)
HPI:  Using dictation device. Unfortunately this device frequently misinterprets words/phrases.  Here for CPE:  -Concerns and/or follow up today:   PMH obesity, hyperlipidemia, hyperglycemia, neuropathy (hx abnormal MRI with ? Hx stroke vs other) sees neurology for management. She reports she has been doing well with full resolution neck, head and leg issues. Reports feeling great. No longer takes neuronin. Reports getting regular exercise and eating healthy. No limitations. Will be starting new job at [employer]. Needs TB screening. Has night sweats with menopause, but o/w low risk and neg screen. Opted for quant gold.  -Diet: variety of foods, balance and well rounded -Exercise:regular exercise -Taking folic acid, vitamin D or calcium: no -Diabetes and Dyslipidemia Screening: labs per orders, not fasting -Vaccines: see vaccine section EPIC -pap history: neg 11/2015 -FDLMP: see nursing notes -sexual activity: yes, female partner, no new partners -wants STI testing (Hep C if born [birth year]): no -FH breast, colon or ovarian ca: see FH Last mammogram: 03/2017 birads 1 Last colon cancer screening: UTD Breast Ca Risk Assessment: see family history and pt history DEXA (>/= 65): n/a  -Alcohol, Tobacco, drug use: see social history  Review of Systems - no fevers, unintentional weight loss, vision loss, hearing loss, chest pain, sob, hemoptysis, melena, hematochezia, hematuria, genital discharge, changing or concerning skin lesions, bleeding, bruising, loc, thoughts of self harm or SI  Past Medical History:  Diagnosis Date  . Allergy   . GERD (gastroesophageal reflux disease)    "does not need meds anymore"  . Heart murmur   . Hyperlipidemia   . Lactose intolerance   . 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HCC)     Past Surgical History:  Procedure Laterality Date  . ABDOMINAL HYSTERECTOMY  2006   benign tumor, complete  . KNEE ARTHROSCOPY Right   . NASAL SEPTUM  SURGERY    . 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 Arthritis Mother   . Hyperlipidemia Mother   . Stroke Mother   . Hypertension Mother   . Colon cancer Neg Hx     Social History   Socioeconomic History  . Marital status: Married    Spouse name: Not on file  . Number of children: Not on file  . Years of education: Not on file  . Highest education level: Not on file  Occupational History  . Not on file  Social Needs  . Financial resource strain: Not on file  . Food insecurity:    Worry: Not on file    Inability: Not on file  . Transportation needs:    Medical: Not on file    Non-medical: Not on file  Tobacco Use  . Smoking status: Never Smoker  . Smokeless tobacco: Never Used  Substance and Sexual Activity  . Alcohol use: No  . Drug use: No  . Sexual activity: Not on file  Lifestyle  . Physical activity:    Days per week: Not on file    Minutes per session: Not on file  . Stress: Not on file  Relationships  . Social connections:    Talks on phone: Not on file    Gets together: Not on file    Attends religious service: Not on file    Active member of club or organization: Not on file    Attends meetings of clubs or organizations: Not on file    Relationship status: Not on file  Other  Topics Concern  . 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  clopidogrel (PLAVIX) 75 MG tablet, Take 1 tablet (75 mg total) by mouth daily., Disp: 90 tablet, Rfl: 3 .  pravastatin (PRAVACHOL) 40 MG tablet, Take 1 tablet (40 mg total) by mouth daily., Disp: 90 tablet, Rfl: 3 .  triamcinolone cream (KENALOG) 0.1 %, Apply 1 application topically 2 (two) times  daily., Disp: 30 g, Rfl: 1  EXAM:  Vitals:   01/17/18 1651  BP: 124/82  Pulse: 66  Temp: 98.3 F (36.8 C)   Body mass index is 33.46 kg/m.  GENERAL: vitals reviewed and listed below, alert, oriented, appears well hydrated and in no acute distress  HEENT: head atraumatic, PERRLA, normal appearance of eyes, ears, nose and mouth. moist mucus membranes.  NECK: supple, no masses or lymphadenopathy  LUNGS: clear to auscultation bilaterally, no rales, rhonchi or wheeze  CV: HRRR, no peripheral edema or cyanosis, normal pedal pulses  ABDOMEN: bowel sounds normal, soft, non tender to palpation, no masses, no rebound or guarding  GU/BREAST: declined, plans to do mammogram in the fall  SKIN: no rash or abnormal lesions  MS: normal gait, moves all extremities normally  NEURO: normal gait, speech and thought processing grossly intact, muscle tone grossly intact throughout  PSYCH: normal affect, pleasant and cooperative  ASSESSMENT AND PLAN:  Discussed the following assessment and plan:  PREVENTIVE EXAM: -Discussed and advised all [place] preventive services health task force level A and B recommendations for age, sex and risks. -Advised at least 150 minutes of exercise per week and a healthy diet with avoidance of (less then 1 serving per week) processed foods, white starches, red meat, fast foods and sweets and consisting of: * 5-9 servings of fresh fruits and vegetables (not corn or potatoes) *nuts and seeds, beans *olives and olive oil *lean meats such as fish and white chicken  *whole grains -labs, studies and vaccines per orders this encounter -discussed flu shot and cdc recs for egg allergy - she plans to consider and review cdc recs  2. Screening for depression neg  3. Tuberculosis screening - QuantiFERON-TB Gold Plus  4. Hyperlipidemia, unspecified hyperlipidemia type -lifestyle recs -refilled pravastatin - Cholesterol, total - HDL cholesterol  5.  Hyperglycemia -lifestyle recs - Hemoglobin A1c  6. Obesity (BMI 30.0-34.9) -lifestyle recs  7. Skin rash -itchy rash on arms in summer, resolves with triamcinolone, likely eczema, wants refill triam cr  Forms to Wendie Simmer to complete once labs back. Patient Instructions  BEFORE YOU LEAVE: -labs -hold forms to complete and return after labs done -follow up: 3-4 months  We have ordered labs or studies at this visit. It can take up to 1-2 weeks for results and processing. IF results require follow up or explanation, we will call you with instructions. Clinically stable results will be released to your [facility] If you have not heard from [place] or cannot find your results in [facility] in 2 weeks please contact our office at [phone].  If you are not yet signed up for [facility], please consider signing up.   Preventive Care 40-64 Years,  Female Preventive care refers to lifestyle choices and visits with your health care provider that can promote health and wellness. What does preventive care include?  A yearly physical exam. This is also called an annual well check.  Dental exams once or twice a year.  Routine eye exams. Ask your health care provider how often you should have your eyes checked.  Personal lifestyle choices, including: ? Daily care of your teeth and gums. ? Regular physical activity. ? Eating a healthy diet. ? Avoiding tobacco and drug use. ? Limiting alcohol use. ? Practicing safe sex. ? Taking low-dose aspirin daily starting at age 47. ? Taking vitamin and mineral supplements as recommended by your health care provider. What happens during an annual well check? The services and screenings done by your health care provider during your annual well check will depend on your age, overall health, lifestyle risk factors, and family history of disease. Counseling Your health care provider may ask you questions about your:  Alcohol use.  Tobacco use.  Drug  use.  Emotional well-being.  Home and relationship well-being.  Sexual activity.  Eating habits.  Work and work Statistician.  Method of birth control.  Menstrual cycle.  Pregnancy history.  Screening You may have the following tests or measurements:  Height, weight, and BMI.  Blood pressure.  Lipid and cholesterol levels. These may be checked every 5 years, or more frequently if you are over 2 years old.  Skin check.  Lung cancer screening. You may have this screening every year starting at age 60 if you have a 30-pack-year history of smoking and currently smoke or have quit within the past 15 years.  Fecal occult blood test (FOBT) of the stool. You may have this test every year starting at age 69.  Flexible sigmoidoscopy or colonoscopy. You may have a sigmoidoscopy every 5 years or a colonoscopy every 10 years starting at age 16.  Hepatitis C blood test.  Hepatitis B blood test.  Sexually transmitted disease (STD) testing.  Diabetes screening. This is done by checking your blood sugar (glucose) after you have not eaten for a while (fasting). You may have this done every 1-3 years.  Mammogram. This may be done every 1-2 years. Talk to your health care provider about when you should start having regular mammograms. This may depend on whether you have a family history of breast cancer.  BRCA-related cancer screening. This may be done if you have a family history of breast, ovarian, tubal, or peritoneal cancers.  Pelvic exam and Pap test. This may be done every 3 years starting at age 12. Starting at age 46, this may be done every 5 years if you have a Pap test in combination with an HPV test.  Bone density scan. This is done to screen for osteoporosis. You may have this scan if you are at high risk for osteoporosis.  Discuss your test results, treatment options, and if necessary, the need for more tests with your health care provider. Vaccines Your health care  provider may recommend certain vaccines, such as:  Influenza vaccine. This is recommended every year.  Tetanus, diphtheria, and acellular pertussis (Tdap, Td) vaccine. You may need a Td booster every 10 years.  Varicella vaccine. You may need this if you have not been vaccinated.  Zoster vaccine. You may need this after age 73.  Measles, mumps, and rubella (MMR) vaccine. You may need at least one dose of MMR if you were born in 1957 or later.  You may also need a second dose.  Pneumococcal 13-valent conjugate (PCV13) vaccine. You may need this if you have certain conditions and were not previously vaccinated.  Pneumococcal polysaccharide (PPSV23) vaccine. You may need one or two doses if you smoke cigarettes or if you have certain conditions.  Meningococcal vaccine. You may need this if you have certain conditions.  Hepatitis A vaccine. You may need this if you have certain conditions or if you travel or work in places where you may be exposed to hepatitis A.  Hepatitis B vaccine. You may need this if you have certain conditions or if you travel or work in places where you may be exposed to hepatitis B.  Haemophilus influenzae type b (Hib) vaccine. You may need this if you have certain conditions.  Talk to your health care provider about which screenings and vaccines you need and how often you need them. This information is not intended to replace advice given to you by your health care provider. Make sure you discuss any questions you have with your health care provider. Document Released: 05/28/2015 Document Revised: 01/19/2016 Document Reviewed: 03/02/2015 Elsevier Interactive Patient Education  2018 [employer].          No follow-ups on file.  Deanna Kern, DO

## 2018-01-18 LAB — CHOLESTEROL, TOTAL: Cholesterol: 136 mg/dL (ref 0–200)

## 2018-01-18 LAB — HDL CHOLESTEROL: HDL: 41.1 mg/dL (ref >39.00)

## 2018-01-18 LAB — HEMOGLOBIN A1C: Hgb A1c MFr Bld: 6.1 % (ref 4.6–6.5)

## 2018-01-19 LAB — QUANTIFERON-TB GOLD PLUS
Mitogen-NIL: >10 IU/mL
NIL: 0.04 IU/mL
QuantiFERON-TB Gold Plus: NEGATIVE
TB1-NIL: 0.05 IU/mL
TB2-NIL: 0.07 IU/mL

## 2018-01-21 ENCOUNTER — Encounter: Payer: Managed Care, Other (non HMO) | Admitting: *Deleted

## 2018-01-21 DIAGNOSIS — Z111 Encounter for screening for respiratory tuberculosis: Secondary | ICD-10-CM

## 2018-01-21 NOTE — Progress Notes (Signed)
This encounter was created in error - please disregard.

## 2018-01-21 NOTE — Progress Notes (Signed)
Per orders of Dr. Maudie Mercury, injection of TB skin test given by Elita Boone. Patient tolerated injection well.

## 2018-03-09 ENCOUNTER — Emergency Department (HOSPITAL_BASED_OUTPATIENT_CLINIC_OR_DEPARTMENT_OTHER)
Admission: EM | Admit: 2018-03-09 | Discharge: 2018-03-09 | Disposition: A | Discharge status: Home / Self Care | Payer: Managed Care, Other (non HMO) | Attending: Emergency Medicine | Admitting: Emergency Medicine

## 2018-03-09 ENCOUNTER — Encounter (HOSPITAL_BASED_OUTPATIENT_CLINIC_OR_DEPARTMENT_OTHER): Payer: Self-pay | Admitting: Emergency Medicine

## 2018-03-09 ENCOUNTER — Other Ambulatory Visit: Payer: Self-pay

## 2018-03-09 DIAGNOSIS — J019 Acute sinusitis, unspecified: Secondary | ICD-10-CM

## 2018-03-09 DIAGNOSIS — R51 Headache: Secondary | ICD-10-CM | POA: Diagnosis not present

## 2018-03-09 DIAGNOSIS — J029 Acute pharyngitis, unspecified: Secondary | ICD-10-CM | POA: Insufficient documentation

## 2018-03-09 DIAGNOSIS — R0989 Other specified symptoms and signs involving the circulatory and respiratory systems: Secondary | ICD-10-CM | POA: Insufficient documentation

## 2018-03-09 DIAGNOSIS — Z79899 Other long term (current) drug therapy: Secondary | ICD-10-CM | POA: Insufficient documentation

## 2018-03-09 DIAGNOSIS — R05 Cough: Secondary | ICD-10-CM | POA: Insufficient documentation

## 2018-03-09 DIAGNOSIS — Z7902 Long term (current) use of antithrombotics/antiplatelets: Secondary | ICD-10-CM | POA: Diagnosis not present

## 2018-03-09 MED ORDER — METHYLPREDNISOLONE 4 MG PO TBPK: ORAL_TABLET | ORAL | 0 refills | Status: DC

## 2018-03-09 MED ORDER — AZITHROMYCIN 250 MG PO TABS: ORAL_TABLET | ORAL | 0 refills | Status: DC

## 2018-03-09 NOTE — ED Triage Notes (Signed)
Diagnosed with a sinus infection 2 weeks ago. She finished her abx but states congestion continues.

## 2018-03-09 NOTE — ED Provider Notes (Signed)
[facility] EMERGENCY DEPARTMENT Provider Note   CSN: [id number] Arrival date & time: 03/09/18  1545     History   Chief Complaint Chief Complaint  Patient presents with  . Nasal Congestion    HPI COLIE Deanna [name] is a 57 y.o. 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  The patient states that 3 weeks ago she developed a sinus infection.  She saw her provider in urgent care was started on amoxicillin and has had almost 10 days worth of amoxicillin.  She states that she was feeling better but over the past 4 days has had worsening symptoms.  She complains of [place] currently worsening pain in the sinuses on the right side of her face with nasal discharge that is thick with scant bloody streaks.  She is also having a sore throat and a slight cough.  She denies severe headache, photophobia, neck stiffness or rash.  HPI  Past Medical History:  Diagnosis Date  . Allergy   . GERD (gastroesophageal reflux disease)    "does not need meds anymore"  . Heart murmur   . Hyperlipidemia   . Lactose intolerance   . TTP (thrombotic thrombocytopenic purpura) [facility])     Patient Active Problem List   Diagnosis Date Noted  . H. pylori infection 12/21/2016  . Anemia 12/12/2016  . Abnormal brain MRI 09/25/2016  . Numbness and tingling in left hand 09/25/2016  . Cervical radiculopathy at C5 09/25/2016  . Neck pain 09/25/2016  . 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 Aortic root dilatation ([place]) 08/07/2016  . Ear pain 07/22/2016  . Hyperlipemia 12/02/2015  . H/O splenectomy 12/02/2015  . Hyperglycemia 01/27/2014  . Obesity 01/27/2014  . Unspecified vitamin D deficiency 09/11/2012    Past Surgical History:  Procedure Laterality Date  . ABDOMINAL HYSTERECTOMY  2006   benign tumor, complete  . KNEE ARTHROSCOPY Right   . NASAL SEPTUM SURGERY    . 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     OB History   None       Home Medications    Prior to Admission medications   Medication Sig Start Date End Date Taking? Authorizing Provider  clopidogrel (PLAVIX) 75 MG tablet Take 1 tablet (75 mg total) by mouth daily. 07/24/17  Yes Cameron Sprang, MD  pravastatin (PRAVACHOL) 40 MG tablet Take 1 tablet (40 mg total) by mouth daily. 01/17/18  Yes Colin Benton R, DO  triamcinolone cream (KENALOG) 0.1 % Apply 1 application topically 2 (two) times daily. 01/17/18   Lucretia Kern, DO    Family History Family History  Problem Relation Age of Onset  . Arthritis Mother   . Hyperlipidemia Mother   . Stroke Mother   . Hypertension Mother   . Colon cancer Neg Hx     Social History Social History   Tobacco Use  . Smoking status: Never Smoker  . Smokeless tobacco: Never Used  Substance Use Topics  . Alcohol use: No  . Drug use: No     Allergies   Eggs or egg-derived products and Lactose intolerance (gi)   Review of Systems Review of Systems  Ten systems reviewed and are negative for acute change, except as noted in the HPI.   Physical Exam Updated Vital Signs BP (!) 145/91 (BP Location: Left Arm)   Pulse 66   Temp 98.6 F (37 C) (Oral)   Resp 18   Ht 5'  9" (1.753 m)   Wt 98.9 kg   SpO2 99%   BMI 32.19 kg/m   Physical Exam  Constitutional: She is oriented to person, place, and time. She appears well-developed and well-nourished. No distress.  HENT:  Head: Normocephalic and atraumatic.  Eyes: Pupils are equal, round, and reactive to light. Conjunctivae and EOM are normal. No scleral icterus.  Neck: Normal range of motion.  Cardiovascular: Normal rate, regular rhythm and normal heart sounds. Exam reveals no gallop and no friction rub.  No murmur heard. Pulmonary/Chest: Effort normal and breath sounds normal. No respiratory distress.  Abdominal: Soft. Bowel sounds are normal. She exhibits no distension and no mass. There is no tenderness. There is no guarding.  Neurological: She is alert  and oriented to person, place, and time.  Skin: Skin is warm and dry. She is not diaphoretic.  Psychiatric: Her behavior is normal.  Nursing note and vitals reviewed.    ED Treatments / Results  Labs (all labs ordered are listed, but only abnormal results are displayed) Labs Reviewed - No data to display  EKG None  Radiology No results found.  Procedures Procedures (including critical care time)  Medications Ordered in ED Medications - No data to display   Initial Impression / Assessment and Plan / ED Course  I have reviewed the triage vital signs and the nursing notes.  Pertinent labs & imaging results that were available during my care of the patient were reviewed by me and considered in my medical decision making (see chart for details).    This is a 57 year old female with worsening sinus symptoms after getting better on amoxicillin.  I have concern for bacterial sinusitis that has not been covered with the amoxicillin.  She does have a history of splenectomy giving her propensity to develop infections as she is chronically immunocompromised.  Patient will be to started on a azithromycin with prednisone taper.  Patient is to follow-up with her primary care physician within the week.  She appears otherwise appropriate for discharge.  She is mildly hypertensive but hemodynamically stable.  Final Clinical Impressions(s) / ED Diagnoses   Final diagnoses:  None    ED Discharge Orders    None       Margarita Mail, PA-C 03/09/18 1635    Julianne Rice, MD 03/10/18 1610

## 2018-03-09 NOTE — Discharge Instructions (Addendum)
Contact a health care provider if: You have a fever. Your symptoms get worse. Your symptoms do not improve within 10 days. Get help right away if: You have a severe headache. You have persistent vomiting. You have pain or swelling around your face or eyes. You have vision problems. You develop confusion. Your neck is stiff. You have trouble breathing.

## 2018-04-30 ENCOUNTER — Other Ambulatory Visit: Payer: Self-pay | Admitting: Family Medicine

## 2018-04-30 DIAGNOSIS — Z1231 Encounter for screening mammogram for malignant neoplasm of breast: Secondary | ICD-10-CM

## 2018-05-28 ENCOUNTER — Encounter: Payer: Self-pay | Admitting: Family Medicine

## 2018-05-28 ENCOUNTER — Ambulatory Visit (INDEPENDENT_AMBULATORY_CARE_PROVIDER_SITE_OTHER): Payer: Managed Care, Other (non HMO) | Admitting: Family Medicine

## 2018-05-28 ENCOUNTER — Encounter: Payer: Self-pay | Admitting: *Deleted

## 2018-05-28 VITALS — BP 118/80 | HR 94 | Temp 100.4°F | Ht 69.0 in | Wt 215.5 lb

## 2018-05-28 DIAGNOSIS — R69 Illness, unspecified: Secondary | ICD-10-CM | POA: Diagnosis not present

## 2018-05-28 DIAGNOSIS — J111 Influenza due to unidentified influenza virus with other respiratory manifestations: Secondary | ICD-10-CM

## 2018-05-28 DIAGNOSIS — J069 Acute upper respiratory infection, unspecified: Secondary | ICD-10-CM

## 2018-05-28 DIAGNOSIS — J029 Acute pharyngitis, unspecified: Secondary | ICD-10-CM | POA: Diagnosis not present

## 2018-05-28 LAB — POCT RAPID STREP A (OFFICE): Rapid Strep A Screen: POSITIVE — AB

## 2018-05-28 MED ORDER — AMOXICILLIN 500 MG PO CAPS: 500.0000 mg | ORAL_CAPSULE | Freq: Two times a day (BID) | ORAL | 0 refills | Status: DC

## 2018-05-28 NOTE — Progress Notes (Signed)
HPI:  Using dictation device. Unfortunately this device frequently misinterprets words/phrases.   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  Past Medical History:  Diagnosis Date  . Allergy   . GERD (gastroesophageal reflux disease)    "does not need meds anymore"  . Heart murmur   . Hyperlipidemia   . Lactose intolerance   . 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HCC)     Past Surgical History:  Procedure Laterality Date  . ABDOMINAL HYSTERECTOMY  2006   benign tumor, complete  . KNEE ARTHROSCOPY Right   . NASAL SEPTUM SURGERY    . 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 Arthritis Mother   . Hyperlipidemia Mother   . Stroke Mother   . Hypertension Mother   . Colon cancer Neg Hx     Social History   Socioeconomic History  . Marital status: Married    Spouse name: Not on file  . Number of children: Not on file  . Years of education: Not on file  . Highest education level: Not on file  Occupational History  . Not on file  Social Needs  . Financial resource strain: Not on file  . Food insecurity:    Worry: Not on file    Inability: Not on file  . Transportation needs:    Medical: Not on file    Non-medical: Not on file  Tobacco Use  . Smoking status: Never Smoker  . Smokeless tobacco: Never Used  Substance and Sexual Activity  . Alcohol use: No  . Drug use: No  . Sexual activity: Not on file  Lifestyle  . Physical activity:    Days per week: Not on file    Minutes per session: Not on file  . Stress: Not on file  Relationships  . Social connections:    Talks on phone:  Not on file    Gets together: Not on file    Attends religious service: Not on file    Active member of club or organization: Not on file    Attends meetings of clubs or organizations: Not on file    Relationship status: Not on file  Other Topics Concern  . 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  clopidogrel (PLAVIX) 75 MG tablet, Take 1 tablet (75 mg total) by mouth daily., Disp: 90 tablet, Rfl: 3 .  pravastatin (PRAVACHOL) 40 MG tablet, Take 1 tablet (40 mg total) by mouth daily., Disp: 90 tablet, Rfl: 3 .  triamcinolone cream (KENALOG) 0.1 %, Apply 1 application topically 2 (two) times daily., Disp: 30 g, Rfl: 1 .  amoxicillin (AMOXIL) 500 MG capsule, Take 1 capsule (500 mg total) by mouth 2 (two) times daily., Disp: 20 capsule, Rfl: 0  EXAM:  Vitals:  05/28/18 1033  BP: 118/80  Pulse: 94  Temp: (!) 100.4 F (38 C)  SpO2: 98%    Body mass index is 31.82 kg/m.  GENERAL: vitals reviewed and listed above, alert, oriented, appears well hydrated and in no acute distress  HEENT: atraumatic, conjunttiva clear, no obvious abnormalities on inspection of external nose and ears, normal appearance of ear canals and TMs, clear nasal congestion, mild post oropharyngeal erythema with PND, 2+ tonsillar edema or exudate, no sinus TTP  NECK: no obvious masses on inspection, some ant cervical lad  LUNGS: clear to auscultation bilaterally, no wheezes, rales or rhonchi, good air movement  CV: HRRR, no peripheral edema  MS: moves all extremities without noticeable abnormality  PSYCH: pleasant and cooperative, no obvious depression or anxiety  ASSESSMENT AND PLAN:  Discussed the following assessment and plan:  Viral  upper respiratory illness  Sore throat - Plan: POC Rapid Strep A  Influenza-like illness  -given HPI and exam findings today, a serious infection or illness is unlikely. We discussed potential etiologies, with VURI being most or strep being most likely and advised strep test, offered flu test, tamiflu if strep neg given pmh, vs treatment for strep, work note and supportive care and monitoring. We discussed treatment side effects, likely course, antibiotic misuse, transmission, and signs of developing a serious illness. -of course, we advised to return or notify a doctor immediately if symptoms worsen or persist or new concerns arise.  Addendum: strep test +, pt notified, treatment discussed, rx sent    Patient Instructions  BEFORE YOU LEAVE: -strep test and culture -work note - stay home until at least 24 hours of no fever and feeling better -follow up: regular follow up in 1 month, follow up sooner as needed  Pending strep results we will treat with antibiotic if strep or Tamiflu if the flu.  I hope you are feeling better soon! Seek care promptly if your symptoms worsen, new concerns arise or you are not improving with treatment.       Deanna Kern, DO

## 2018-05-28 NOTE — Patient Instructions (Signed)
BEFORE YOU LEAVE: -strep test and culture -work note - stay home until at least 24 hours of no fever and feeling better -follow up: regular follow up in 1 month, follow up sooner as needed  Pending strep results we will treat with antibiotic if strep or Tamiflu if the flu.  I hope you are feeling better soon! Seek care promptly if your symptoms worsen, new concerns arise or you are not improving with treatment.

## 2018-06-10 ENCOUNTER — Ambulatory Visit
Admission: RE | Admit: 2018-06-10 | Discharge: 2018-06-10 | Disposition: A | Discharge status: Home / Self Care | Payer: Managed Care, Other (non HMO) | Source: Ambulatory Visit | Attending: Family Medicine | Admitting: Family Medicine

## 2018-06-10 DIAGNOSIS — Z1231 Encounter for screening mammogram for malignant neoplasm of breast: Secondary | ICD-10-CM

## 2018-07-24 ENCOUNTER — Ambulatory Visit: Payer: Managed Care, Other (non HMO) | Admitting: Neurology

## 2018-09-26 IMAGING — MR MR HEAD WO/W CM
13 series · 48 of 48 positions shown · IV contrast (multihance)
Comparison: None.

CLINICAL DATA: Headache, dizziness, LEFT hand numbness for 10 days.
History of hyperglycemia, hyperlipidemia.

EXAM:
MRI HEAD WITHOUT AND WITH CONTRAST
TECHNIQUE: Multiplanar, multiecho pulse sequences of the brain and surrounding
structures were obtained without and with intravenous contrast.
CONTRAST:  20mL MULTIHANCE GADOBENATE DIMEGLUMINE 529 MG/ML IV SOLN

[Series 5: T1 · sagittal · 4.0mm · 0.75mm/px · 1 of 31 slices shown (1 of 3)]
[im 1/31]
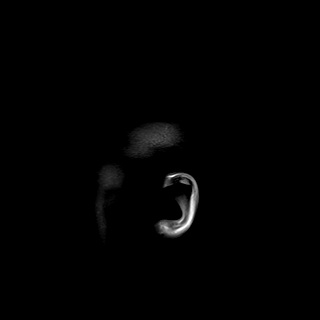

[Series 6: DWI · axial · 3.0mm · 1.44mm/px · z∈[-61,+73]mm · 5 of 84 slices shown (1 of 4)]
[im 1/84]
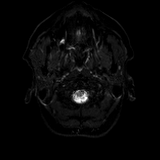
[im 21/84]
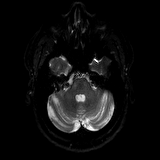
[im 42/84]
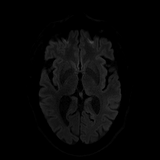
[im 63/84]
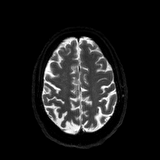
[im 84/84]
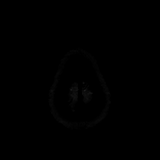

[Series 7: DWI · axial · 3.0mm · 1.44mm/px · z∈[-61,+73]mm · 2 of 42 slices shown (2 of 4)]
[im 1/42]
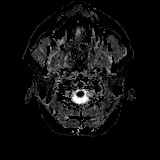
[im 42/42]
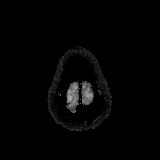

[Series 8: DWI · coronal · 5.0mm · 1.44mm/px · 3 of 60 slices shown (3 of 4)]
[im 1/60]
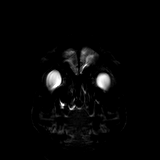
[im 30/60]
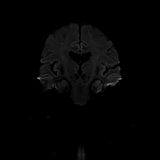
[im 60/60]
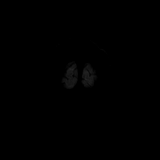

[Series 9: DWI · coronal · 5.0mm · 1.44mm/px · 2 of 30 slices shown (4 of 4)]
[im 1/30]
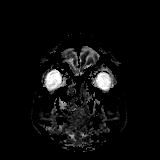
[im 30/30]
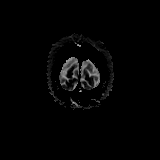

[Series 10: T2 · axial · 4.0mm · 0.36mm/px · z∈[-62,+72]mm · 2 of 27 slices shown]
[im 1/27]
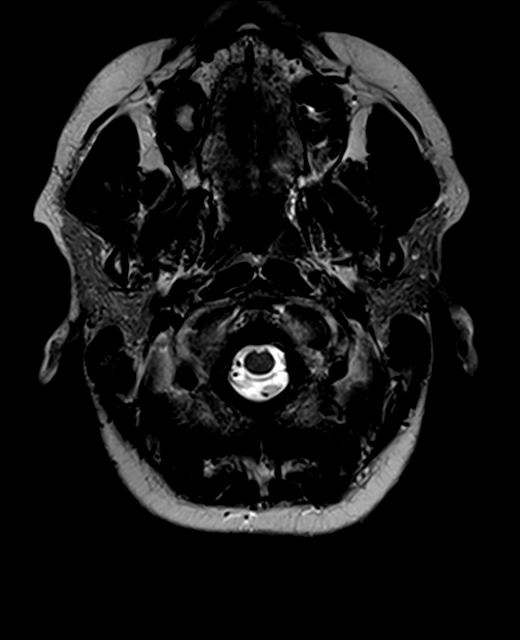
[im 27/27]
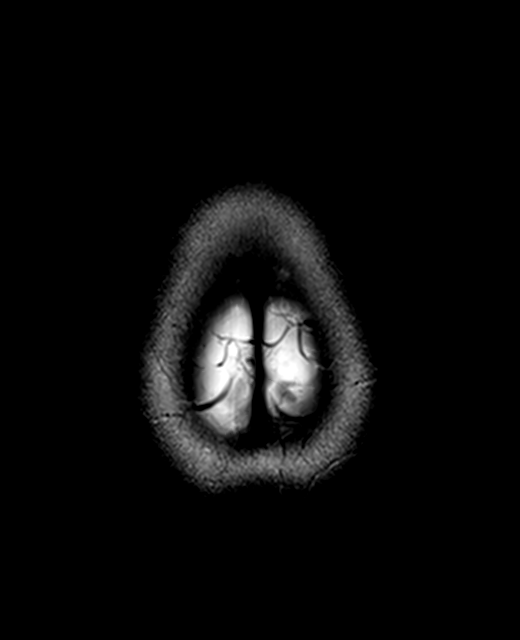

[Series 11: FLAIR · axial · 4.0mm · 0.72mm/px · z∈[-61,+73]mm · 2 of 27 slices shown]
[im 1/27]
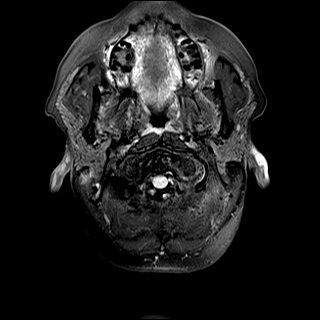
[im 27/27]
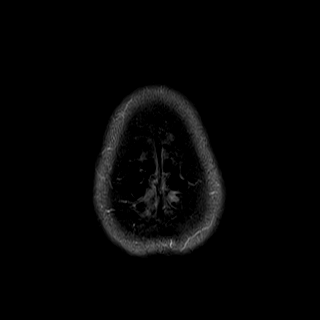

[Series 12: mip_images(sw) · axial · 12.0mm · 0.90mm/px · z∈[-60,+71]mm · 5 of 89 slices shown]
[im 1/89]
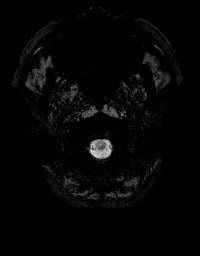
[im 23/89]
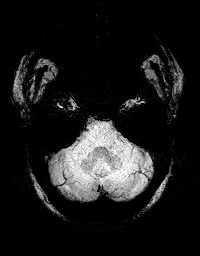
[im 45/89]
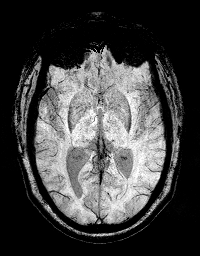
[im 67/89]
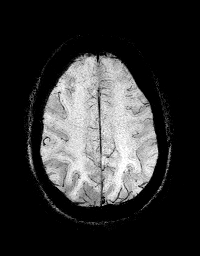
[im 89/89]
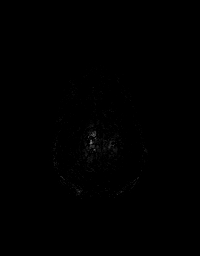

[Series 13: swi_images · axial · 1.5mm · 0.90mm/px · z∈[-66,+76]mm · 6 of 96 slices shown]
[im 1/96]
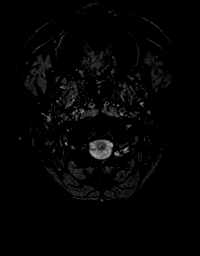
[im 20/96]
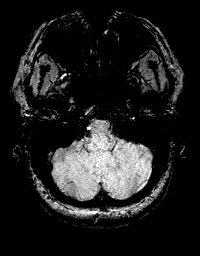
[im 39/96]
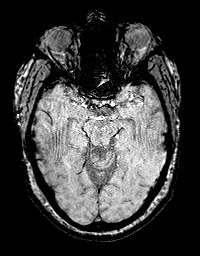
[im 58/96]
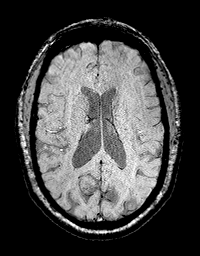
[im 77/96]
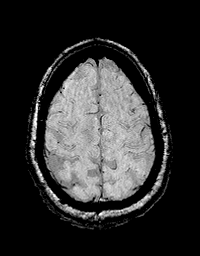
[im 96/96]
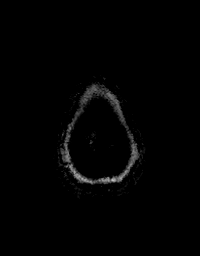

[Series 14: T1 · axial · 1.0mm · 0.90mm/px · z∈[-66,+76]mm · 8 of 144 slices shown (2 of 3)]
[im 1/144]
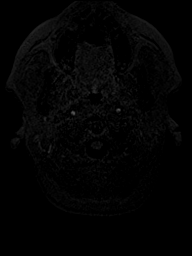
[im 21/144]
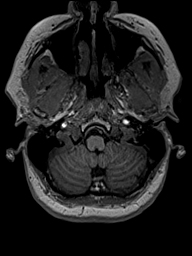
[im 41/144]
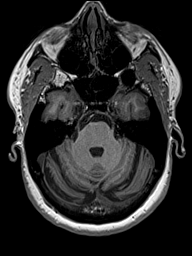
[im 62/144]
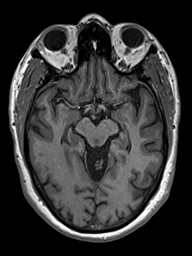
[im 82/144]
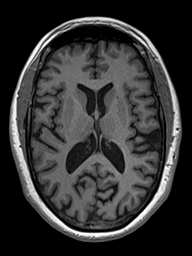
[im 103/144]
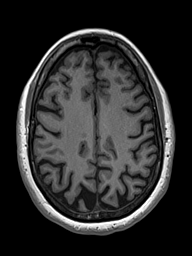
[im 123/144]
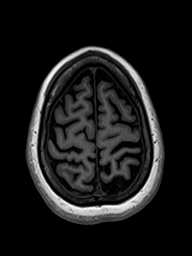
[im 144/144]
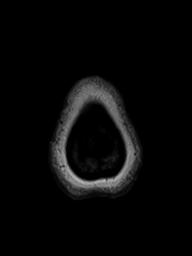

[Series 15: T2 post-contrast · coronal · 4.0mm · 0.36mm/px · 2 of 31 slices shown]
[im 1/31]
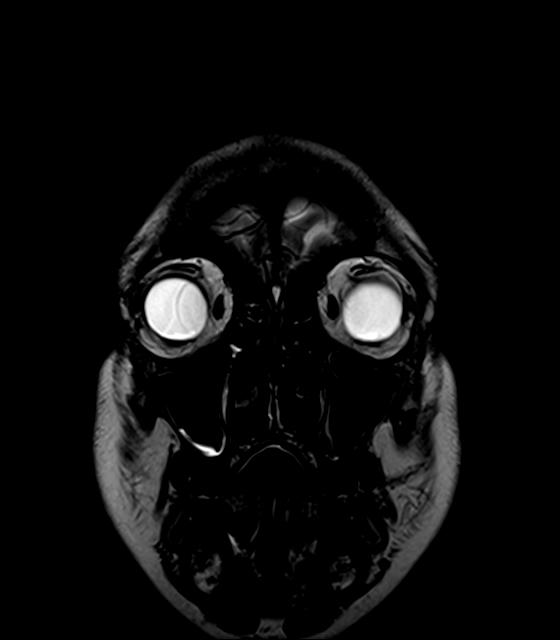
[im 31/31]
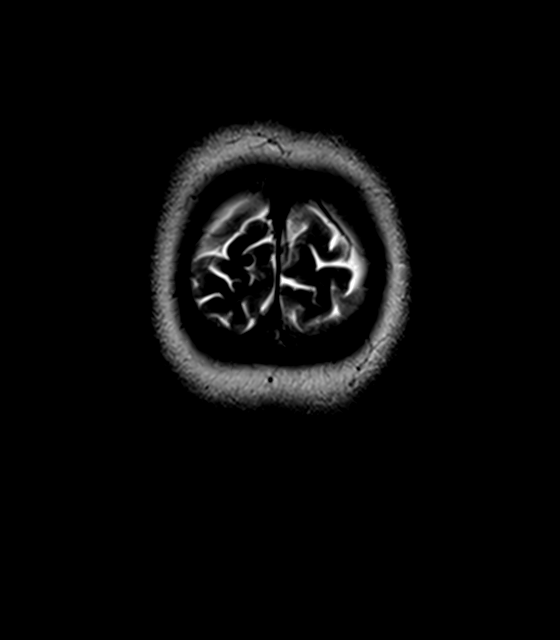

[Series 16: T1 · axial · 1.0mm · 0.90mm/px · z∈[-66,+76]mm · 8 of 144 slices shown (3 of 3)]
[im 1/144]
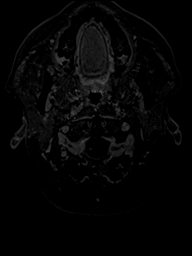
[im 21/144]
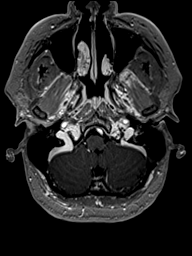
[im 41/144]
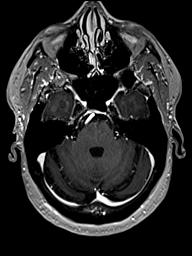
[im 62/144]
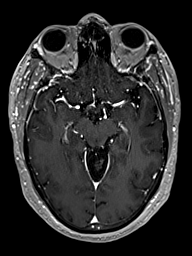
[im 82/144]
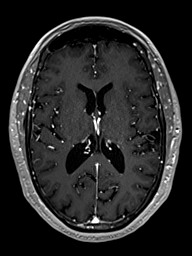
[im 103/144]
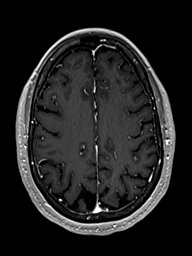
[im 123/144]
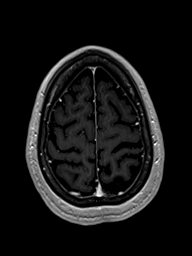
[im 144/144]
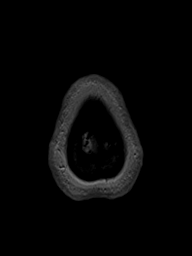

[Series 17: T1 post-contrast · coronal · 4.0mm · 0.72mm/px · 2 of 31 slices shown]
[im 1/31]
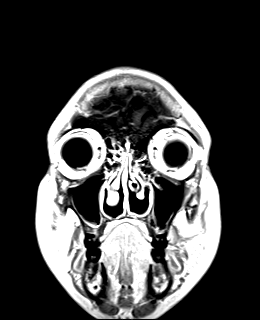
[im 31/31]
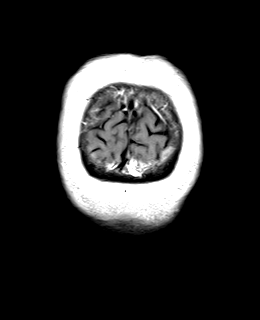

[48 of 48 positions shown; findings below may reference images not displayed]

FINDINGS: INTRACRANIAL CONTENTS: Punctate focus of reduced diffusion RIGHT
frontal lobe deep white matter, difficult to characterize on ADC map
due to small size. No susceptibility artifact to suggest hemorrhage.
At least 5 additional subcentimeter white matter and RIGHT parietal
cortical FLAIR T2 hyperintensities including LEFT occipital horn
white matter with low T1 signal. 3 mm FLAIR T2 hyperintensity medial
LEFT thalamus. Mild prominence of the ventricles and sulci for
patient's age. LEFT parietal developmental venous anomaly. No
suspicious intraparenchymal or extra-axial enhancement. No mass
effect or masses. No abnormal extra-axial fluid collections.

VASCULAR: Normal major intracranial vascular flow voids present at
skull base.

SKULL AND UPPER CERVICAL SPINE: Expanded partially empty sella. No
suspicious calvarial bone marrow signal. Craniocervical junction
maintained.

SINUSES/ORBITS: Mild paranasal sinus mucosal thickening. Mastoid air
cells are well aerated. The included ocular globes and orbital
contents are non-suspicious.

OTHER: None.
IMPRESSION: 1. Punctate focus of reduced diffusion RIGHT frontal lobe, which can
be seen with acute ischemia or hyperacute demyelination.
2. Additional subcentimeter white matter and cortical lesions in a
pattern associated with chronic demyelination less likely small old
infarcts including RIGHT parietal cortex and LEFT thalamus.
3. Mild parenchymal brain volume loss for age.
These results will be called to the ordering clinician or
representative by the Radiologist Assistant, and communication
documented in the [facility] Dashboard.

## 2018-10-17 ENCOUNTER — Other Ambulatory Visit: Payer: Self-pay | Admitting: Neurology

## 2018-10-17 DIAGNOSIS — R200 Anesthesia of skin: Secondary | ICD-10-CM

## 2018-11-25 ENCOUNTER — Encounter: Payer: Self-pay | Admitting: Family Medicine

## 2018-11-25 ENCOUNTER — Ambulatory Visit (INDEPENDENT_AMBULATORY_CARE_PROVIDER_SITE_OTHER): Payer: Managed Care, Other (non HMO) | Admitting: Family Medicine

## 2018-11-25 ENCOUNTER — Other Ambulatory Visit: Payer: Self-pay

## 2018-11-25 DIAGNOSIS — G5602 Carpal tunnel syndrome, left upper limb: Secondary | ICD-10-CM

## 2018-11-25 DIAGNOSIS — R600 Localized edema: Secondary | ICD-10-CM | POA: Diagnosis not present

## 2018-11-25 DIAGNOSIS — G629 Polyneuropathy, unspecified: Secondary | ICD-10-CM

## 2018-11-25 DIAGNOSIS — H6982 Other specified disorders of Eustachian tube, left ear: Secondary | ICD-10-CM

## 2018-11-25 NOTE — Progress Notes (Signed)
Virtual Visit via Video Note  I connected with Deanna [name] on 11/25/18 at 11:00 AM EDT by a video enabled telemedicine application and verified that I am speaking with the correct person using two identifiers.  Location patient: home Location provider:work or home office Persons participating in the virtual visit: patient, provider  I discussed the limitations of evaluation and management by telemedicine and the availability of in person appointments. The patient expressed understanding and agreed to proceed.   HPI: Pt is a 58 yo female with pmh sig for carpal tunnel, cervical radiculopathy, seasonal allergies, GERD, HLD, TTP seen for acute concern(s).  Pt typically seen by Dr. Maudie Mercury.  Acute concerns: -L ear pain, "gnawing pain" -started last Wednesday -put oil in her ear without relief -endorses h/o allergies, not currently on meds -denies fever, chills, HAs, rhinorrhea, cough, sore throat -feeling like her "whole left side is asleep".  -followed by Neurology.  Has not made f/u appt. -pt denies changes in speech -L ankle edema.  Started this wknd. -eating more salt lately.  States has cramps if doesn't  -denies SOB, calf pain, erythema, ankle pain. -L arm having increased pain with movement at elbow. -has a h/o carpal tunnel.  Wearing brace at night.   ROS: See pertinent positives and negatives per HPI.  Past Medical History:  Diagnosis Date  . Allergy   . GERD (gastroesophageal reflux disease)    "does not need meds anymore"  . Heart murmur   . Hyperlipidemia   . Lactose intolerance   . 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HCC)     Past Surgical History:  Procedure Laterality Date  . ABDOMINAL HYSTERECTOMY  2006   benign tumor, complete  . KNEE ARTHROSCOPY Right   . NASAL SEPTUM SURGERY    . 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 Arthritis Mother   . Hyperlipidemia Mother   . Stroke Mother   .  Hypertension Mother   . Colon cancer Neg Hx      Current Outpatient Medications:  .  amoxicillin (AMOXIL) 500 MG capsule, Take 1 capsule (500 mg total) by mouth 2 (two) times daily., Disp: 20 capsule, Rfl: 0 .  clopidogrel (PLAVIX) 75 MG tablet, TAKE 1 TABLET BY MOUTH EVERY DAY, Disp: 90 tablet, Rfl: 3 .  pravastatin (PRAVACHOL) 40 MG tablet, Take 1 tablet (40 mg total) by mouth daily., Disp: 90 tablet, Rfl: 3 .  triamcinolone cream (KENALOG) 0.1 %, Apply 1 application topically 2 (two) times daily., Disp: 30 g, Rfl: 1  EXAM:  VITALS per patient if applicable:  GENERAL: alert, oriented, appears well and in no acute distress  HEENT: atraumatic, conjunctiva clear, no obvious abnormalities on inspection of external nose and ears  NECK: normal movements of the head and neck  LUNGS: on inspection no signs of respiratory distress, breathing rate appears normal, no obvious gross SOB, gasping or wheezing  CV: no obvious cyanosis  MS: moves all visible extremities without noticeable abnormality  PSYCH/NEURO: pleasant and cooperative, no obvious depression or anxiety, speech and thought processing grossly intact  ASSESSMENT AND PLAN:  Discussed the following assessment and plan:  Dysfunction of left eustachian tube  -acute issue -discussed controlling allergies -pt to start flonase and allegra -f/u in the next few days for continued symptoms  Carpal tunnel syndrome of left wrist  -continue wearing brace at night -consider heat, ice, exercises -f/u with neurology  Neuropathy  -no acute  neuro deficits noted during visit.  Would recommend in person f/u for further eval. -discussed f/u with Neurology -would repeat imaging, MRI brain and spine given recent symptoms to r/o demylinating lesions  Lower extremity edema  -L ankle edema -discussed monitoring sodium intake -consider TED hose or compression socks, elevated LEs when sitting.  F/u prn  I discussed the assessment and  treatment plan with the patient. The patient was provided an opportunity to ask questions and all were answered. The patient agreed with the plan and demonstrated an understanding of the instructions.   The patient was advised to call back or seek an in-person evaluation if the symptoms worsen or if the condition fails to improve as anticipated.  Billie Ruddy, MD

## 2018-12-23 ENCOUNTER — Other Ambulatory Visit: Payer: Self-pay

## 2018-12-23 ENCOUNTER — Encounter: Payer: Self-pay | Admitting: Neurology

## 2018-12-23 ENCOUNTER — Ambulatory Visit (INDEPENDENT_AMBULATORY_CARE_PROVIDER_SITE_OTHER): Payer: Managed Care, Other (non HMO) | Admitting: Neurology

## 2018-12-23 VITALS — BP 131/82 | HR 72 | Ht 69.0 in | Wt 227.1 lb

## 2018-12-23 DIAGNOSIS — M79605 Pain in left leg: Secondary | ICD-10-CM | POA: Diagnosis not present

## 2018-12-23 DIAGNOSIS — R202 Paresthesia of skin: Secondary | ICD-10-CM

## 2018-12-23 DIAGNOSIS — M5412 Radiculopathy, cervical region: Secondary | ICD-10-CM | POA: Diagnosis not present

## 2018-12-23 DIAGNOSIS — R200 Anesthesia of skin: Secondary | ICD-10-CM

## 2018-12-23 MED ORDER — GABAPENTIN 100 MG PO CAPS: ORAL_CAPSULE | ORAL | 6 refills | Status: DC

## 2018-12-23 NOTE — Patient Instructions (Signed)
1. Restart gabapentin 100mg : take 1 capsule every night for 1 week, then increase to 2 capsules every night. We can increase dose if necessary  2. Schedule EMG/NCV of the left arm and leg  3. Follow-up in 3-4 months, call for any changes

## 2018-12-23 NOTE — Progress Notes (Signed)
NEUROLOGY FOLLOW UP OFFICE NOTE  Deanna [name] [id number] 1961-02-23  HISTORY OF PRESENT ILLNESS: I had the pleasure of seeing Deanna [name] in follow-up in the neurology clinic on 12/23/2018.  The patient was last seen in March 2019. She initially presented in 2018 after an episode of left hand numbness and tingling, as well as reporting a 3-year history of intermittent dizzy spells. Her MRI brain in March 2018 showed a faint punctate focus of reduced diffusion in the right frontal lobe deep white matter, possibly due to acute ischemia or hyperacute demyelination. There were scattered FLAIR hyperintensities seen bilaterally. Stroke workup unremarkable, she is taking Plavix 75mg  daily without side effects. Repeat MRI brain in September 2018 did not show any acute changes, there was re-demonstration of right parietal cortical to subcortical white matter T2 hyperintensity and scattered FLAIR changes bilaterally. MRI cervical and thoracic spine did not show any demyelinating lesions, there was note of disc bulges possibly irritating left C5 nerve root, likely the cause of left arm symptoms. She was taking gabapentin 300mg  qhs, and reports stopping medication since she was doing so good. Symptoms recurred last month, she reports problems with her whole left side. She reports burning in the inner forearm, pain on the dorsum of left hand. She cannot lift anything. She has tried wrist braces at night with no effect. No neck pain. She denies any paresthesias or weakness in the left leg, but reports left knee swelling, and swelling in both ankles. She has cramps in her toes. She has occasional back pain. No recent falls. She denies any headaches, dizziness, vision changes. Face is unaffected.   History on Initial Assessment 07/20/2016: This is a pleasant 58 yo RH woman with a history of hyperlipidemia who reported a 3-year history of intermittent episodes of dizziness described as spinning, as well as feeling  lightheaded with blurred vision lasting 20-30 seconds. Recently she started having different symptoms, around 2 weeks ago she started getting carsick, which is new for her. A week ago, her left hand was numb, "like it was not there," then 20-30 seconds later it started tingling. Face and leg were unaffected. She did not have any neck pain at that time, but 3 days later started having a headache with pressure over the frontal regions that she ascribed to her sinuses. She started having pretty significant intermittent nausea. She continued to have dizziness, last episode was a week ago. The headaches have resolved. Blurred vision still comes and goes. She denies any diplopia, dysarthria/dysphagia, back pain, bowel/bladder dysfunction, negative Lhermitte sign. She denies any prior history of focal symptoms, no prior history of vision loss. She denies any head injuries. 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 There is a family history of stroke.  She had an MRI brain with and without contrast last 07/18/2016 which I personally reviewed, there was a faint punctate focus of reduced diffusion in the right frontal lobe deep white matter, difficult to see on the ADC map due to small size, this can be seen with acute ischemia or hyperacute demyelination. There were at least 5 additional subcentimeter white matter, right parietal, medial left thalamus, and left occipital horn FLAIR hyperintensities. There was mild prominence of the ventricles and sulci for age. No abnormal enhancement seen. 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   PAST MEDICAL HISTORY: Past Medical History:  Diagnosis Date   Allergy    GERD (gastroesophageal reflux disease)    "  does not need meds anymore"   Heart murmur    Hyperlipidemia    Lactose intolerance    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HCC)     MEDICATIONS: Current Outpatient  Medications on File Prior to Visit  Medication Sig Dispense Refill   amoxicillin (AMOXIL) 500 MG capsule Take 1 capsule (500 mg total) by mouth 2 (two) times daily. 20 capsule 0   clopidogrel (PLAVIX) 75 MG tablet TAKE 1 TABLET BY MOUTH EVERY DAY 90 tablet 3   pravastatin (PRAVACHOL) 40 MG tablet Take 1 tablet (40 mg total) by mouth daily. 90 tablet 3   triamcinolone cream (KENALOG) 0.1 % Apply 1 application topically 2 (two) times daily. 30 g 1   No current facility-administered medications on file prior to visit.     ALLERGIES: Allergies  Allergen Reactions   Eggs Or Egg-Derived Products Nausea And Vomiting   Lactose Intolerance (Gi) Other (See Comments)    vomiting    FAMILY HISTORY: Family History  Problem Relation Age of Onset   Arthritis Mother    Hyperlipidemia Mother    Stroke Mother    Hypertension Mother    Colon cancer Neg Hx     SOCIAL HISTORY: Social History   Socioeconomic History   Marital status: Married    Spouse name: Not on file   Number of children: Not on file   Years of education: Not on file   Highest education level: Not on file  Occupational History   Not on file  Social Needs   Financial resource strain: Not on file   Food insecurity    Worry: Not on file    Inability: Not on file   Transportation needs    Medical: Not on file    Non-medical: Not on file  Tobacco Use   Smoking status: Never Smoker   Smokeless tobacco: Never Used  Substance and Sexual Activity   Alcohol use: No   Drug use: No   Sexual activity: Not on file  Lifestyle   Physical activity    Days per week: Not on file    Minutes per session: Not on file   Stress: Not on file  Relationships   Social connections    Talks on phone: Not on file    Gets together: Not on file    Attends religious service: Not on file    Active member of club or organization: Not on file    Attends meetings of clubs or organizations: Not on file     Relationship status: Not on file   Intimate partner violence    Fear of current or ex partner: Not on file    Emotionally abused: Not on file    Physically abused: Not on file    Forced sexual activity: Not on file  Other Topics Concern   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: lives 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, husband and mother      Spiritual Beliefs: Christian      Lifestyle: walks a little a few days per week, diet is ok             REVIEW OF SYSTEMS: Constitutional: No fevers, chills, or sweats, no generalized fatigue, change in appetite Eyes: No visual changes, double vision, eye pain Ear, nose and throat: No hearing loss, ear pain, nasal congestion, sore throat Cardiovascular: No chest pain, palpitations Respiratory:  No shortness of breath  at rest or with exertion, wheezes GastrointestinaI: No nausea, vomiting, diarrhea, abdominal pain, fecal incontinence Genitourinary:  No dysuria, urinary retention or frequency Musculoskeletal:  No neck pain,+ back pain Integumentary: No rash, pruritus, skin lesions Neurological: as above Psychiatric: No depression, insomnia, anxiety Endocrine: No palpitations, fatigue, diaphoresis, mood swings, change in appetite, change in weight, increased thirst Hematologic/Lymphatic:  No anemia, purpura, petechiae. Allergic/Immunologic: no itchy/runny eyes, nasal congestion, recent allergic reactions, rashes  PHYSICAL EXAM: Vitals:   12/23/18 1111  BP: 131/82  Pulse: 72  SpO2: 97%   General: No acute distress Head:  Normocephalic/atraumatic Neck: supple, no paraspinal tenderness, full range of motion Skin/Extremities: No rash, minimal bipedal edema, no pitting edema Neurological Exam: alert and oriented to person, place, and time. No aphasia or dysarthria. Fund of knowledge is appropriate.  Recent and remote memory are intact.  Attention and concentration are normal.    Able  to name objects and repeat phrases. Cranial nerves: Pupils equal, round, reactive to light.  Extraocular movements intact with no nystagmus. Visual fields full. Facial sensation intact. No facial asymmetry. Tongue, uvula, palate midline.  Motor: Bulk and tone normal, muscle strength 5/5 throughout with no pronator drift.  Sensation to light touch, cold, pin intact.  No extinction to double simultaneous stimulation.  Deep tendon reflexes +1 throughout, toes downgoing.  Finger to nose testing intact.  Gait narrow-based and steady, able to tandem walk adequately.  Romberg negative.  IMPRESSION: This is a 58 yo RH woman with a history of hyperlipidemia, who presented initially with a 3-year history of intermittent dizziness suggestive of vertigo that had worsened with nausea, headaches, and a transient episode of left hand numbness and tingling. There have been no further episodes of dizziness or headaches. MRI brain in 2018 had shown a faint punctate focus of reduced diffusion in the right frontal lobe deep white matter, difficult to see on the ADC map due to small size, this can be seen with acute ischemia or hyperacute demyelination. Repeat MRI did not show any progression or new changes. Cervical and thoracic MRI did not show any demyelinating lesions. She also has left C5 radiculopathy on her cervical MRI. She is taking Plavix for secondary stroke prevention. She did well for a year until last month with burning paresthesias in the left arm and pain/swelling in the left leg. EMG/NCV of the left arm and leg will be ordered to further evaluate symptoms. She is agreeable to restarting gabapentin 100mg  qhs x 1 week, then increase to 200mg  qhs. We may uptitrate as tolerated. She will follow-up in 1 year and knows to call for any changes.   Thank you for allowing me to participate in her care.  Please do not hesitate to call for any questions or concerns.   Ellouise Newer, M.D.   CC: Dr. Maudie Mercury

## 2018-12-27 ENCOUNTER — Telehealth (INDEPENDENT_AMBULATORY_CARE_PROVIDER_SITE_OTHER): Payer: Managed Care, Other (non HMO) | Admitting: Family Medicine

## 2018-12-27 ENCOUNTER — Other Ambulatory Visit: Payer: Managed Care, Other (non HMO)

## 2018-12-27 ENCOUNTER — Other Ambulatory Visit: Payer: Self-pay

## 2018-12-27 DIAGNOSIS — G629 Polyneuropathy, unspecified: Secondary | ICD-10-CM | POA: Diagnosis not present

## 2018-12-27 DIAGNOSIS — R600 Localized edema: Secondary | ICD-10-CM | POA: Diagnosis not present

## 2018-12-27 LAB — TSH: TSH: 1.34 u[IU]/mL (ref 0.35–4.50)

## 2018-12-27 LAB — CBC WITH DIFFERENTIAL/PLATELET
Basophils Absolute: 0.1 10*3/uL (ref 0.0–0.1)
Basophils Relative: 1.1 % (ref 0.0–3.0)
Eosinophils Absolute: 0.1 10*3/uL (ref 0.0–0.7)
Eosinophils Relative: 1.9 % (ref 0.0–5.0)
HCT: 34.9 % — ABNORMAL LOW (ref 36.0–46.0)
Hemoglobin: 11.4 g/dL — ABNORMAL LOW (ref 12.0–15.0)
Lymphocytes Relative: 35.4 % (ref 12.0–46.0)
Lymphs Abs: 2.4 10*3/uL (ref 0.7–4.0)
MCHC: 32.7 g/dL (ref 30.0–36.0)
MCV: 87.2 fl (ref 78.0–100.0)
Monocytes Absolute: 0.7 10*3/uL (ref 0.1–1.0)
Monocytes Relative: 10.2 % (ref 3.0–12.0)
Neutro Abs: 3.5 10*3/uL (ref 1.4–7.7)
Neutrophils Relative %: 51.4 % (ref 43.0–77.0)
Platelets: 418 10*3/uL — ABNORMAL HIGH (ref 150.0–400.0)
RBC: 4.01 Mil/uL (ref 3.87–5.11)
RDW: 14.1 % (ref 11.5–15.5)
WBC: 6.8 10*3/uL (ref 4.0–10.5)

## 2018-12-27 LAB — HEMOGLOBIN A1C: Hgb A1c MFr Bld: 6.1 % (ref 4.6–6.5)

## 2018-12-27 LAB — COMPREHENSIVE METABOLIC PANEL
ALT: 14 U/L (ref 0–35)
AST: 15 U/L (ref 0–37)
Albumin: 4.1 g/dL (ref 3.5–5.2)
Alkaline Phosphatase: 80 U/L (ref 39–117)
BUN: 11 mg/dL (ref 6–23)
CO2: 30 mEq/L (ref 19–32)
Calcium: 9.1 mg/dL (ref 8.4–10.5)
Chloride: 105 mEq/L (ref 96–112)
Creatinine, Ser: 0.75 mg/dL (ref 0.40–1.20)
GFR: 96.75 mL/min (ref >60.00)
Glucose, Bld: 78 mg/dL (ref 70–99)
Potassium: 4 mEq/L (ref 3.5–5.1)
Sodium: 141 mEq/L (ref 135–145)
Total Bilirubin: 0.7 mg/dL (ref 0.2–1.2)
Total Protein: 7.1 g/dL (ref 6.0–8.3)

## 2018-12-27 LAB — T4, FREE: Free T4: 0.75 ng/dL (ref 0.60–1.60)

## 2018-12-27 LAB — BRAIN NATRIURETIC PEPTIDE: Pro B Natriuretic peptide (BNP): 54 pg/mL (ref 0.0–100.0)

## 2018-12-27 NOTE — Progress Notes (Signed)
Virtual Visit via Video Note  I connected with Corliss Skains on 12/27/18 at 11:30 AM EDT by a video enabled telemedicine application 2/2 [id number] pandemic and verified that I am speaking with the correct person using two identifiers.  Location patient: home Location provider:work or home office Persons participating in the virtual visit: patient, provider  I discussed the limitations of evaluation and management by telemedicine and the availability of in person appointments. The patient expressed understanding and agreed to proceed.   HPI: Pt seen by Neurology 8/10 for ongoing L sided pain and numbness.  Has EMG/NCS planned.  Restarted Gapapentin 100 mg.  Since last OFV, pt notes b/l pedal edema.  Previously noted only on L sided.  Pt is on her feet all day.  Starts noticing the swelling by 11 am, progressively worse by the end of day.  Ordered compression socks that should arrive in a few days.  Does not note eating increased sodium.  Now drinking 32 oz of water per day.  Denies SOB, CP, dizziness, fatigue.   ROS: See pertinent positives and negatives per HPI.  Past Medical History:  Diagnosis Date  . Allergy   . GERD (gastroesophageal reflux disease)    "does not need meds anymore"  . Heart murmur   . Hyperlipidemia   . Lactose intolerance   . 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HCC)     Past Surgical History:  Procedure Laterality Date  . ABDOMINAL HYSTERECTOMY  2006   benign tumor, complete  . KNEE ARTHROSCOPY Right   . NASAL SEPTUM SURGERY    . 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 Arthritis Mother   . Hyperlipidemia Mother   . Stroke Mother   . Hypertension Mother   . Colon cancer Neg Hx     Current Outpatient Medications:  .  clopidogrel (PLAVIX) 75 MG tablet, TAKE 1 TABLET BY MOUTH EVERY DAY, Disp: 90 tablet, Rfl: 3 .  gabapentin (NEURONTIN) 100 MG capsule, Take 1 capsule every night for 1 week,  then increase to 2 capsules every night, Disp: 60 capsule, Rfl: 6 .  pravastatin (PRAVACHOL) 40 MG tablet, Take 1 tablet (40 mg total) by mouth daily., Disp: 90 tablet, Rfl: 3  EXAM:  VITALS per patient if applicable: RR between 65-68 bpm  GENERAL: alert, oriented, appears well and in no acute distress  HEENT: atraumatic, conjunctiva clear, no obvious abnormalities on inspection of external nose and ears  NECK: normal movements of the head and neck  LUNGS: on inspection no signs of respiratory distress, breathing rate appears normal, no obvious gross SOB, gasping or wheezing  CV: no obvious cyanosis  MS: moves all visible extremities without noticeable abnormality  PSYCH/NEURO: pleasant and cooperative, no obvious depression or anxiety, speech and thought processing grossly intact  ASSESSMENT AND PLAN:  Discussed the following assessment and plan:  Bilateral lower extremity edema  -worsening -discussed possible causes -pt encouraged to wear compression socks, elevated LEs when sitting, and monitor sodium intake. - Plan: Brain Natriuretic Peptide, CBC with Differential/Platelet, Comprehensive metabolic panel, TSH, T4, Free  Neuropathy  -stable -continue f/u with Neurology for EMG/NCS -continue Gabapentin - Plan: Hemoglobin A1c  F/u prn   I discussed the assessment and treatment plan with the patient. The patient was provided an opportunity to ask questions and all were answered. The patient agreed with the plan and demonstrated an understanding of the instructions.   The patient was  advised to call back or seek an in-person evaluation if the symptoms worsen or if the condition fails to improve as anticipated.   Billie Ruddy, MD

## 2019-01-03 ENCOUNTER — Telehealth: Payer: Self-pay

## 2019-01-03 NOTE — Telephone Encounter (Signed)
  Spoke with pt verbalized understanding of er lab results      Copied from [place] [phone]. Topic: General - Other >> Dec 31, 2018  1:44 PM Celene Kras A wrote: Reason for CRM: Pt called and is requesting her lab results. Pt requesting to have a message left if she cannot be reached. Please advise.

## 2019-01-22 ENCOUNTER — Other Ambulatory Visit: Payer: Self-pay | Admitting: Family Medicine

## 2019-01-28 ENCOUNTER — Ambulatory Visit (INDEPENDENT_AMBULATORY_CARE_PROVIDER_SITE_OTHER): Payer: Managed Care, Other (non HMO) | Admitting: Neurology

## 2019-01-28 ENCOUNTER — Other Ambulatory Visit: Payer: Self-pay

## 2019-01-28 DIAGNOSIS — M79605 Pain in left leg: Secondary | ICD-10-CM

## 2019-01-28 DIAGNOSIS — M5412 Radiculopathy, cervical region: Secondary | ICD-10-CM

## 2019-01-28 DIAGNOSIS — R200 Anesthesia of skin: Secondary | ICD-10-CM

## 2019-01-28 NOTE — Procedures (Signed)
[facility]  [place], [place]  [place], [place] [address] Tel: [phone] Fax:  [phone] Test Date:  01/28/2019  Patient: Deanna [name] DOB: Apr 09, 1961 Physician: Narda Amber, DO  Sex: Female Height: 5\' 9"  Ref Phys: Ellouise Newer, MD  ID#: [id number] Temp: 34.0C Technician:    Patient Complaints: This is a 58 year old female referred for evaluation of left arm paresthesias and left leg pain.  NCV & EMG Findings: Extensive electrodiagnostic testing of the left upper and lower extremity shows:  1. All sensory responses including the left median, ulnar, mixed palmar, sural, and superficial peroneal nerves are within normal limits. 2. All motor responses including the left median, ulnar, peroneal, tibial nerves are within normal limits. 3. Left tibial H reflex study is within normal limits. 4. There is no evidence of active or chronic motor axonal loss changes affecting any of the tested muscles.  Motor unit configuration and recruitment pattern is within normal limits.  Impression: This is a normal study of the left upper and lower extremities.    In particular, there is no evidence of carpal tunnel syndrome, cervical/lumbosacral radiculopathy, or sensorimotor polyneuropathy.   ___________________________ Narda Amber, DO    Nerve Conduction Studies Anti Sensory Summary Table   Site NR Peak (ms) Norm Peak (ms) P-T Amp (V) Norm P-T Amp  Left Median Anti Sensory (2nd Digit)  Wrist    3.0 <3.6 37.2 >15  Left Sup Peroneal Anti Sensory (Ant Lat Mall)  34C  12 cm    2.0 <4.6 14.7 >4  Left Sural Anti Sensory (Lat Mall)  34C  Calf    2.5 <4.6 10.0 >4  Left Ulnar Anti Sensory (5th Digit)  34C  Wrist    2.8 <3.1 27.2 >10   Motor Summary Table   Site NR Onset (ms) Norm Onset (ms) O-P Amp (mV) Norm O-P Amp Site1 Site2 Delta-0 (ms) Dist (cm) Vel (m/s) Norm Vel (m/s)  Left Median Motor (Abd Poll Brev)  34C  Wrist    3.0 <4.0 13.7 >6 Elbow Wrist 5.3  31.0 58 >50  Elbow    8.3  12.4         Left Peroneal Motor (Ext Dig Brev)  34C  Ankle    3.4 <6.0 5.7 >2.5 B Fib Ankle 7.7 39.0 51 >40  B Fib    11.1  4.7  Poplt B Fib 1.3 8.0 62 >40  Poplt    12.4  4.5         Left Tibial Motor (Abd Hall Brev)  34C  Ankle    4.7 <6.0 9.9 >4 Knee Ankle 7.8 41.0 53 >40  Knee    12.5  5.1         Left Ulnar Motor (Abd Dig Minimi)  34C  Wrist    2.3 <3.1 11.0 >7 B Elbow Wrist 4.0 24.0 60 >50  B Elbow    6.3  10.9  A Elbow B Elbow 1.7 10.0 59 >50  A Elbow    8.0  10.3          Comparison Summary Table   Site NR Peak (ms) Norm Peak (ms) P-T Amp (V) Site1 Site2 Delta-P (ms) Norm Delta (ms)  Left Median/Ulnar Palm Comparison (Wrist - 8cm)  34C  Median Palm    1.8 <2.2 60.1 Median Palm Ulnar Palm 0.1   Ulnar Palm    1.7 <2.2 17.7       H Reflex Studies   NR H-Lat (ms) Lat  Norm (ms) L-R H-Lat (ms)  Left Tibial (Gastroc)  34C     34.56 <35    EMG   Side Muscle Ins Act Fibs Psw Fasc Number Recrt Dur Dur. Amp Amp. Poly Poly. Comment  Left AntTibialis Nml Nml Nml Nml Nml Nml Nml Nml Nml Nml Nml Nml N/A  Left Gastroc Nml Nml Nml Nml Nml Nml Nml Nml Nml Nml Nml Nml N/A  Left Flex Dig Long Nml Nml Nml Nml Nml Nml Nml Nml Nml Nml Nml Nml N/A  Left RectFemoris Nml Nml Nml Nml Nml Nml Nml Nml Nml Nml Nml Nml N/A  Left GluteusMed Nml Nml Nml Nml Nml Nml Nml Nml Nml Nml Nml Nml N/A  Left 1stDorInt Nml Nml Nml Nml Nml Nml Nml Nml Nml Nml Nml Nml N/A  Left PronatorTeres Nml Nml Nml Nml Nml Nml Nml Nml Nml Nml Nml Nml N/A  Left Biceps Nml Nml Nml Nml Nml Nml Nml Nml Nml Nml Nml Nml N/A  Left Triceps Nml Nml Nml Nml Nml Nml Nml Nml Nml Nml Nml Nml N/A  Left Deltoid Nml Nml Nml Nml Nml Nml Nml Nml Nml Nml Nml Nml N/A      Waveforms:

## 2019-01-30 ENCOUNTER — Telehealth: Payer: Self-pay

## 2019-01-30 NOTE — Telephone Encounter (Signed)
-----   Message from Alda Berthold, DO sent at 01/29/2019  8:59 PM EDT ----- Please inform patient that her nerve and muscle testing is normal.  Thanks.

## 2019-01-30 NOTE — Telephone Encounter (Signed)
Left message informing pt of results and to call with any concerns.

## 2019-02-12 ENCOUNTER — Telehealth: Payer: Self-pay | Admitting: Neurology

## 2019-02-12 NOTE — Telephone Encounter (Signed)
Patient called in regarding her NCS results. She said that she is still having the issues with her Arm and Feet. She is asking what the next step would be? She asked that you please leave a Voicemail because she will be at work.  Please Call. Thanks

## 2019-02-13 NOTE — Telephone Encounter (Signed)
Left message per patient request and repeated results that nerve and muscle testing was normal (message left on machine previously on 9/17).   Patient's message with [place] stated she is still having issues with arm and feet and wanted to know next steps.  I called patient back to get more specific info for MD but as patient not there asked her to call us back with specific information.  Dr. Delice Lesch - FYI above and when patient calls back tomorrow with more details will update you.

## 2019-02-13 NOTE — Telephone Encounter (Signed)
Update - patient called back and stated she is taking the Gabapentin 100mg  (2 capsules every night). It makes her a little "zoned out" and sleepy when she sits down during the day.   She states it has not helped with the nerve pain. Still having L wrist discomfort (thought carpal tunnel) and L arm pinches and stings if lifts it up. Left toes are painful when she straightens out.   Informed patient will pass along info to MD.

## 2019-02-14 NOTE — Telephone Encounter (Signed)
It's short notice, but can she do a visit (either in person or virtual) on Monday either 10am or 1:30pm?

## 2019-02-14 NOTE — Telephone Encounter (Signed)
She is coming in the office Monday Oct 09-2018 @1 :30

## 2019-02-17 ENCOUNTER — Encounter: Payer: Self-pay | Admitting: Neurology

## 2019-02-17 ENCOUNTER — Other Ambulatory Visit: Payer: Self-pay

## 2019-02-17 ENCOUNTER — Ambulatory Visit (INDEPENDENT_AMBULATORY_CARE_PROVIDER_SITE_OTHER): Payer: Managed Care, Other (non HMO) | Admitting: Neurology

## 2019-02-17 VITALS — BP 132/79 | HR 66 | Ht 69.0 in | Wt 222.4 lb

## 2019-02-17 DIAGNOSIS — M25522 Pain in left elbow: Secondary | ICD-10-CM | POA: Diagnosis not present

## 2019-02-17 DIAGNOSIS — M5412 Radiculopathy, cervical region: Secondary | ICD-10-CM | POA: Diagnosis not present

## 2019-02-17 DIAGNOSIS — M542 Cervicalgia: Secondary | ICD-10-CM | POA: Diagnosis not present

## 2019-02-17 NOTE — Progress Notes (Signed)
NEUROLOGY FOLLOW UP OFFICE NOTE  Deanna [name] [id number] February 07, 1961  HISTORY OF PRESENT ILLNESS: I had the pleasure of seeing Deanna [name] in follow-up in the neurology clinic on 02/17/2019.  The patient was last seen 2 months ago. She was initially evaluated in 2018 after an episode of left hand numbness and tingling, as well as reporting a 3-year history of intermittent dizzy spells. Her MRI brain in March 2018 showed a faint punctate focus of reduced diffusion in the right frontal lobe deep white matter, possibly due to acute ischemia or hyperacute demyelination. There were scattered FLAIR hyperintensities seen bilaterally. Stroke workup unremarkable, she is taking Plavix 75mg  daily without side effects. Repeat MRI brain in September 2018 did not show any acute changes, there was re-demonstration of right parietal cortical to subcortical white matter T2 hyperintensity and scattered FLAIR changes bilaterally. MRI cervical and thoracic spine  In 2018 did not show any demyelinating lesions, there was note of disc bulges possibly irritating left C5 nerve root, likely the cause of left arm symptoms. On her last visit, she was reporting burning in the left inner forearm and left wrist pain. She was also reporting leg pain and neck pain. She had an EMG/NCV of the left arm and leg which was normal, no evidence of carpal tunnel syndrome, cervical/lumbosacral radiculopathy, or sensorimotor polyneuropathy. She has been on gabapentin 300mg  qhs but feels "zoned out" the next day despite taking it the night prior. She presents to discuss next steps.  She continues to have pain on her left arm, most irritating in the medial part of her left elbow and wrist. She reports a lot of stinging pain. She has back > neck pain, and has also noticed left ear pain. She feels her left arm is weaker. She denies any falls. She is worried about venous discoloration in her feet, she stands for prolonged periods of time. She  also worries about the thickened toe nail on her left big toe.   History on Initial Assessment 07/20/2016: This is a pleasant 58 yo RH woman with a history of hyperlipidemia who reported a 3-year history of intermittent episodes of dizziness described as spinning, as well as feeling lightheaded with blurred vision lasting 20-30 seconds. Recently she started having different symptoms, around 2 weeks ago she started getting carsick, which is new for her. A week ago, her left hand was numb, "like it was not there," then 20-30 seconds later it started tingling. Face and leg were unaffected. She did not have any neck pain at that time, but 3 days later started having a headache with pressure over the frontal regions that she ascribed to her sinuses. She started having pretty significant intermittent nausea. She continued to have dizziness, last episode was a week ago. The headaches have resolved. Blurred vision still comes and goes. She denies any diplopia, dysarthria/dysphagia, back pain, bowel/bladder dysfunction, negative Lhermitte sign. She denies any prior history of focal symptoms, no prior history of vision loss. She denies any head injuries. 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 There is a family history of stroke.  She had an MRI brain with and without contrast last 07/18/2016 which I personally reviewed, there was a faint punctate focus of reduced diffusion in the right frontal lobe deep white matter, difficult to see on the ADC map due to small size, this can be seen with acute ischemia or hyperacute demyelination. There were at least 5 additional subcentimeter  white matter, right parietal, medial left thalamus, and left occipital horn FLAIR hyperintensities. There was mild prominence of the ventricles and sulci for age. No abnormal enhancement seen. 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   PAST MEDICAL HISTORY: Past  Medical History:  Diagnosis Date  . Allergy   . GERD (gastroesophageal reflux disease)    "does not need meds anymore"  . Heart murmur   . Hyperlipidemia   . Lactose intolerance   . 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HCC)     MEDICATIONS: Current Outpatient Medications on File Prior to Visit  Medication Sig Dispense Refill  . clopidogrel (PLAVIX) 75 MG tablet TAKE 1 TABLET BY MOUTH EVERY DAY 90 tablet 3  . gabapentin (NEURONTIN) 100 MG capsule Take 1 capsule every night for 1 week, then increase to 2 capsules every night 60 capsule 6  . pravastatin (PRAVACHOL) 40 MG tablet Take 1 tablet (40 mg total) by mouth daily. 90 tablet 3   No current facility-administered medications on file prior to visit.     ALLERGIES: Allergies  Allergen Reactions  . Eggs Or Egg-Derived Products Nausea And Vomiting  . Lactose Intolerance (Gi) Other (See Comments)    vomiting    FAMILY HISTORY: Family History  Problem Relation Age of Onset  . Arthritis Mother   . Hyperlipidemia Mother   . Stroke Mother   . Hypertension Mother   . Colon cancer Neg Hx     SOCIAL HISTORY: Social History   Socioeconomic History  . Marital status: Married    Spouse name: Not on file  . Number of children: Not on file  . Years of education: Not on file  . Highest education level: Not on file  Occupational History  . Not on file  Social Needs  . Financial resource strain: Not on file  . Food insecurity    Worry: Not on file    Inability: Not on file  . Transportation needs    Medical: Not on file    Non-medical: Not on file  Tobacco Use  . Smoking status: Never Smoker  . Smokeless tobacco: Never Used  Substance and Sexual Activity  . Alcohol use: No  . Drug use: No  . Sexual activity: Not on file  Lifestyle  . Physical activity    Days per week: Not on file    Minutes per session: Not on file  . Stress: Not on file  Relationships  . Social Herbalist on phone: Not on file     Gets together: Not on file    Attends religious service: Not on file    Active member of club or organization: Not on file    Attends meetings of clubs or organizations: Not on file    Relationship status: Not on file  . Intimate partner violence    Fear of current or ex partner: Not on file    Emotionally abused: Not on file    Physically abused: Not on file    Forced sexual activity: Not on file  Other Topics Concern  . 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: lives 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, husband and mother      Spiritual Beliefs: Christian      Lifestyle: walks a little a few days per week, diet is ok  REVIEW OF SYSTEMS: Constitutional: No fevers, chills, or sweats, no generalized fatigue, change in appetite Eyes: No visual changes, double vision, eye pain Ear, nose and throat: No hearing loss, ear pain, nasal congestion, sore throat Cardiovascular: No chest pain, palpitations Respiratory:  No shortness of breath at rest or with exertion, wheezes GastrointestinaI: No nausea, vomiting, diarrhea, abdominal pain, fecal incontinence Genitourinary:  No dysuria, urinary retention or frequency Musculoskeletal:  + neck pain,+ back pain Integumentary: No rash, pruritus, skin lesions Neurological: as above Psychiatric: No depression, insomnia, anxiety Endocrine: No palpitations, fatigue, diaphoresis, mood swings, change in appetite, change in weight, increased thirst Hematologic/Lymphatic:  No anemia, purpura, petechiae. Allergic/Immunologic: no itchy/runny eyes, nasal congestion, recent allergic reactions, rashes  PHYSICAL EXAM: Vitals:   02/17/19 1344  BP: 132/79  Pulse: 66  SpO2: 98%   General: No acute distress Head:  Normocephalic/atraumatic Neck: supple, no paraspinal tenderness, full range of motion Skin/Extremities: No rash, venous discoloration in both feet, no pitting edema Neurological  Exam: alert and oriented to person, place, and time. No aphasia or dysarthria. Fund of knowledge is appropriate.  Recent and remote memory are intact.  Attention and concentration are normal.    Able to name objects and repeat phrases. Cranial nerves: Pupils equal, round, reactive to light.  Extraocular movements intact with no nystagmus. Visual fields full. Facial sensation intact. No facial asymmetry. Tongue, uvula, palate midline.  Motor: Bulk and tone normal, muscle strength 5/5 throughout with no pronator drift.  Sensation to light touch, cold, pin intact.  No extinction to double simultaneous stimulation.  Deep tendon reflexes +1 throughout, toes downgoing.  Finger to nose testing intact.  Gait narrow-based and steady, able to tandem walk adequately.  Romberg negative.  IMPRESSION: This is a 58 yo RH woman with a history of hyperlipidemia, who presented initially with a 3-year history of intermittent dizziness suggestive of vertigo that had worsened with nausea, headaches, and a transient episode of left hand numbness and tingling. There have been no further episodes of dizziness or headaches. MRI brain in 2018 had shown a faint punctate focus of reduced diffusion in the right frontal lobe deep white matter, difficult to see on the ADC map due to small size, this can be seen with acute ischemia or hyperacute demyelination. Repeat MRI did not show any progression or new changes. Cervical and thoracic MRI in 2018 did not show any demyelinating lesions. She also has left C5 radiculopathy on her cervical MRI. Continue Plavix for secondary stroke prevention. Her main concern today is the continued burning/stinging pain on left arm, mostly on elbow/wrist region. EMG/NCV of left arm and leg normal, this appears musculoskeletal, she will be referred to PT. We discussed a trial of Lyrica 50mg  qhs for the pain, side effects discussed. She will try Lyrica 50mg  qhs x 1 week, then increase to 100mg  qhs. She will call  our office for an update in 2 weeks and we may add on a muscle relaxant at that time. If PT is ineffective, repeat MRI cervical spine will be ordered and she will be referred to Ortho. Stop gabapentin. Discuss foot skin/nail changes with PCP. Follow up as scheduled in December 2020.   Thank you for allowing me to participate in her care.  Please do not hesitate to call for any questions or concerns.   Ellouise Newer, M.D.   CC: Dr. Volanda Napoleon

## 2019-02-17 NOTE — Patient Instructions (Addendum)
1. Refer to PT for cervical radiculopathy, elbow pain, neck pain  2. Try the Lyrica 50mg : take 1 cap every night for 1 week, then increase to 2 caps every night. Call for an update in 2 weeks, we will plan to start a muscle relaxant at that time  3. Stop the Gabapentin  4. Follow-up as scheduled in December, call for any changes

## 2019-02-20 ENCOUNTER — Other Ambulatory Visit: Payer: Self-pay

## 2019-02-20 ENCOUNTER — Ambulatory Visit: Payer: Managed Care, Other (non HMO) | Attending: Neurology

## 2019-02-20 DIAGNOSIS — R293 Abnormal posture: Secondary | ICD-10-CM | POA: Diagnosis present

## 2019-02-20 DIAGNOSIS — M542 Cervicalgia: Secondary | ICD-10-CM | POA: Diagnosis not present

## 2019-02-20 DIAGNOSIS — M25522 Pain in left elbow: Secondary | ICD-10-CM | POA: Insufficient documentation

## 2019-02-20 DIAGNOSIS — M436 Torticollis: Secondary | ICD-10-CM | POA: Insufficient documentation

## 2019-02-20 NOTE — Therapy (Signed)
[place], [place], [address] Phone: [phone]   Fax:  [phone]  Physical Therapy Evaluation  Patient Details  Name: Deanna [name] MRN: [id number] Date of Birth: [birth year] Referring Provider (PT): Ellouise Newer, MD   Encounter Date: 02/20/2019  PT End of Session - 02/20/19 1449    Visit Number  1    Number of Visits  12    Date for PT Re-Evaluation  04/04/19    PT Start Time  0200    PT Stop Time  0240    PT Time Calculation (min)  40 min    Activity Tolerance  Patient tolerated treatment well;No increased pain    Behavior During Therapy  WFL for tasks assessed/performed       Past Medical History:  Diagnosis Date  . Allergy   . GERD (gastroesophageal reflux disease)    "does not need meds anymore"  . Heart murmur   . Hyperlipidemia   . Lactose intolerance   . 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HCC)     Past Surgical History:  Procedure Laterality Date  . ABDOMINAL HYSTERECTOMY  2006   benign tumor, complete  . KNEE ARTHROSCOPY Right   . NASAL SEPTUM SURGERY    . 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   Subjective Assessment - 02/20/19 1358    Subjective  She reports neck pain and [name] pain.      This episode started 10/2018.     She did not do the HEP issued from last episode of PT    She reports no injury. no specific reason.  [name] pain new from past.    Pertinent History  CVA,   No joint surgeries.    Limitations  Lifting;House hold activities   typing,   Diagnostic tests  Xrays not done recently.    Currently in Pain?  Yes    Pain Location  Neck   ear Lt   Pain Orientation  Left;Posterior    Pain Descriptors / Indicators  Radiating   pins   Pain Type  Chronic pain    Pain Radiating Towards  [name] to hand    Pain Onset  More than a month ago    Pain Frequency  Intermittent    Aggravating Factors   lifting     Pain Relieving Factors  meds         [facility] PT Assessment - 02/20/19 0001      Assessment   Medical Diagnosis  Cervicalgia    Referring Provider (PT)  Ellouise Newer, MD    Onset Date/Surgical Date  --   10/2018   Hand Dominance  Right    Next MD Visit  04/2019    Prior Therapy  PT in past for neck and back      Precautions   Precautions  None      Restrictions   Weight Bearing Restrictions  No      Balance Screen   Has the patient fallen in the past 6 months  No      Prior Function   Level of Independence  Independent      Cognition   Overall Cognitive Status  Within Functional Limits for tasks assessed      Observation/Other Assessments   Focus on Therapeutic Outcomes (FOTO)   32%   predicted improvement   29%  Posture/Postural Control   Posture Comments  mild forward head and rounde shoulder , flat thoracic spine.       ROM / Strength   AROM / PROM / Strength  AROM;Strength      AROM   AROM Assessment Site  Cervical    Cervical Flexion  42    Cervical Extension  63    Cervical - Right Side Bend  30    Cervical - Left Side Bend  22    Cervical - Right Rotation  60    Cervical - Left Rotation  70      Strength   Overall Strength Comments  Grossly normal. Reported some Lt medail elbow pain.   Mild Lt wrist flexor proximal muscle soreness on MMT.      Palpation   Palpation comment  No pain with neck ROM and with quadrant testing  , no pain with palpation to upper cervical spine and  LT SCM insertion.    Some tenderness medial epicondyle.                 Objective measurements completed on examination: See above findings.              PT Education - 02/20/19 1434    Education Details  POC HEP , posture ed    Person(s) Educated  Patient    Methods  Explanation;Demonstration;Tactile cues;Verbal cues;Handout    Comprehension  Verbalized understanding       PT Short Term Goals - 02/20/19 1434      PT SHORT TERM GOAL #1   Title   She will be indpendent with initial hEP    Time  3    Period  Weeks    Status  New      PT SHORT TERM GOAL #2   Title  report 30% reduction in cervical pain and stiffness and arm pain /symptoms after sitting long periods for work and lifting    Time  3    Period  Weeks    Status  New      PT SHORT TERM GOAL #3   Title  cervical otation with be equal at 70 degree s or better    Time  3    Period  Weeks    Status  New        PT Long Term Goals - 02/20/19 1436      PT LONG TERM GOAL #1   Title  be independent in advanced HEP    Time  6    Period  Weeks    Status  New      PT LONG TERM GOAL #2   Title  reduce FOTO to < or = to 26% limitation    Time  6    Period  Weeks    Status  New      PT LONG TERM GOAL #3   Title  demonstrate correct posture and body mechanics for ADLs and home tasks to reduce cervical stress    Time  6    Period  Weeks    Status  New      PT LONG TERM GOAL #4   Title  report a 75% reduction in cervical pain  and LR arm symptoms with home tasks and lifting.    Time  6    Period  Weeks    Status  New      PT LONG TERM GOAL #5   Title  She will  report able to use computer with no [name] pain    Time  6    Period  Weeks    Status  New             Plan - 02/20/19 1443    Clinical Impression Statement  [name] presents with intermittant LF ear and neck pain and feeling of heaviness and aching  into [name] and specifically medial forarm apin. She was tender at the medial epidondyle so may have sonme tennis elbow pain.   I was not able to elicite any arm symptoms with arm movements nad  she wa only tender in LT upper trap with no tenderness in paraspinals or around TMJ or  origin of SCM.  shehas some neck stiffness and mile forward head. she should make some improvement with skilled PT  and consistent HEP.    Personal Factors and Comorbidities  Past/Current Experience;Time since onset of injury/illness/exacerbation    Examination-Activity  Limitations  Lift   vacumming, heavier cleaning   Examination-Participation Restrictions  Other   work typing.   Stability/Clinical Decision Making  Stable/Uncomplicated    Clinical Decision Making  Low    Rehab Potential  Good    PT Frequency  2x / week    PT Duration  6 weeks    PT Treatment/Interventions  Taping;Passive range of motion;Dry needling;Patient/family education;Therapeutic exercise;Ultrasound;Traction;Moist Heat;Iontophoresis 4mg /ml Dexamethasone;Cryotherapy;Manual techniques    PT Next Visit Plan  REview HEp ,  manual and modalities for pain and ROM .   Possibe traction . Ionto or tape to LT elbow.    PT Home Exercise Plan  Cervical flexion  and rotation , wrist extension stretch, ice Lt elbow    Consulted and Agree with Plan of Care  Patient       Patient will benefit from skilled therapeutic intervention in order to improve the following deficits and impairments:  Pain, Increased muscle spasms, Impaired UE functional use, Decreased range of motion, Decreased activity tolerance, Postural dysfunction  Visit Diagnosis: Cervicalgia - Plan: PT plan of care cert/re-cert  Pain in left elbow - Plan: PT plan of care cert/re-cert  Stiffness of neck - Plan: PT plan of care cert/re-cert  Abnormal posture - Plan: PT plan of care cert/re-cert     Problem List Patient Active Problem List   Diagnosis Date Noted  . H. pylori infection 12/21/2016  . Anemia 12/12/2016  . Abnormal brain MRI 09/25/2016  . Numbness and tingling in left hand 09/25/2016  . Cervical radiculopathy at C5 09/25/2016  . Neck pain 09/25/2016  . 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 Aortic root dilatation ([place]) 08/07/2016  . Ear pain 07/22/2016  . Hyperlipemia 12/02/2015  . H/O splenectomy 12/02/2015  . Hyperglycemia 01/27/2014  . Obesity 01/27/2014  . Unspecified vitamin D deficiency 09/11/2012    [name]  PT 02/20/2019, 2:56 PM  [facility] [address] [place], [place], [address] Phone: [phone]   Fax:  [phone]  Name: Deanna [name] MRN: [id number] Date of Birth: [birth year]

## 2019-02-20 NOTE — Patient Instructions (Signed)
Cervical flexion and rotation 2-3 reps 3x/day  20-30 sec hold gentle.  Sit erect for good posture with  Back support.  Ice LT medial elbow 10-15 min 3x/day, wrist  Extension stretch  30 sec x 3 3x/day

## 2019-02-28 ENCOUNTER — Encounter

## 2019-03-05 ENCOUNTER — Ambulatory Visit: Payer: Managed Care, Other (non HMO) | Admitting: Physical Therapy

## 2019-03-10 ENCOUNTER — Ambulatory Visit: Payer: Managed Care, Other (non HMO) | Admitting: Physical Therapy

## 2019-03-10 ENCOUNTER — Encounter: Payer: Self-pay | Admitting: Physical Therapy

## 2019-03-10 ENCOUNTER — Other Ambulatory Visit: Payer: Self-pay

## 2019-03-10 DIAGNOSIS — M542 Cervicalgia: Secondary | ICD-10-CM

## 2019-03-10 DIAGNOSIS — M436 Torticollis: Secondary | ICD-10-CM

## 2019-03-10 DIAGNOSIS — M25522 Pain in left elbow: Secondary | ICD-10-CM

## 2019-03-10 DIAGNOSIS — R293 Abnormal posture: Secondary | ICD-10-CM

## 2019-03-10 NOTE — Therapy (Signed)
[place], [place], [address] Phone: [phone]   Fax:  [phone]  Physical Therapy Treatment  Patient Details  Name: Deanna [name] MRN: [id number] Date of Birth: [birth year] Referring Provider (PT): Ellouise Newer, MD   Encounter Date: 03/10/2019  Pt was seen from 1445 to 1538, documentation was completed in the evening due to Epic down time.    PT End of Session - 03/10/19 2251    Visit Number  2    Number of Visits  12    Date for PT Re-Evaluation  04/04/19    PT Start Time  [id number]    PT Stop Time  [id number]    PT Time Calculation (min)  53 min    Activity Tolerance  Patient tolerated treatment well;No increased pain    Behavior During Therapy  WFL for tasks assessed/performed       Past Medical History:  Diagnosis Date  . Allergy   . GERD (gastroesophageal reflux disease)    "does not need meds anymore"  . Heart murmur   . Hyperlipidemia   . Lactose intolerance   . 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HCC)     Past Surgical History:  Procedure Laterality Date  . ABDOMINAL HYSTERECTOMY  2006   benign tumor, complete  . KNEE ARTHROSCOPY Right   . NASAL SEPTUM SURGERY    . 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  Subjective Assessment - 03/10/19 2242    Subjective  Pt arriving to therapy today reporting no pain in her neck and shoulders at rest. Pt did however report pain with certain activiites like sweeping and houshold chores which were difficult over the weekend.    Pertinent History  CVA,   No joint surgeries.    Limitations  Lifting;House hold activities    Diagnostic tests  Xrays not done recently.    Currently in Pain?  No/denies                       [facility] Adult PT Treatment/Exercise - 03/10/19 0001      Exercises   Exercises  Neck      Neck Exercises: Standing   Other Standing Exercises  rows: red  theraband x 15 reps, shoulder extension with red theraband x 15 reps      Neck Exercises: Supine   Neck Retraction  10 reps;5 secs    Lateral Flexion  Right;Left;5 reps;Limitations    Other Supine Exercise  shoulder flexion with cane x 20 reps, serratus punches with cane x 20 reps      Modalities   Modalities  Cryotherapy      Moist Heat Therapy   Number Minutes Moist Heat  --    Moist Heat Location  --      Cryotherapy   Number Minutes Cryotherapy  8 Minutes    Cryotherapy Location  Upper arm   left elbow   Type of Cryotherapy  Ice pack      Manual Therapy   Manual Therapy  Soft tissue mobilization;Passive ROM    Manual therapy comments  occipital release, radial nerve flossing    Soft tissue mobilization  L upper trap, Left forearm    Passive ROM  L shoulder, L elbow, cervical spine      Neck Exercises: Stretches   Upper Trapezius Stretch  Right;Left;2 reps;20 seconds    Levator Stretch  Right;Left;2  reps;20 seconds             PT Education - 03/10/19 2244    Education Details  Discussed Iontophoresis for L elbow pain    Person(s) Educated  Patient    Methods  Explanation    Comprehension  Verbalized understanding       PT Short Term Goals - 03/10/19 2252      PT SHORT TERM GOAL #1   Title  She will be indpendent with initial hEP    Time  3    Period  Weeks    Status  On-going      PT SHORT TERM GOAL #2   Title  report 30% reduction in cervical pain and stiffness and arm pain /symptoms after sitting long periods for work and lifting    Time  3    Period  Weeks    Status  New      PT SHORT TERM GOAL #3   Title  cervical rotation with be equal at 70 degree s or better    Time  3    Period  Weeks    Status  New        PT Long Term Goals - 02/20/19 1436      PT LONG TERM GOAL #1   Title  be independent in advanced HEP    Time  6    Period  Weeks    Status  New      PT LONG TERM GOAL #2   Title  reduce FOTO to < or = to 26% limitation     Time  6    Period  Weeks    Status  New      PT LONG TERM GOAL #3   Title  demonstrate correct posture and body mechanics for ADLs and home tasks to reduce cervical stress    Time  6    Period  Weeks    Status  New      PT LONG TERM GOAL #4   Title  report a 75% reduction in cervical pain  and LR arm symptoms with home tasks and lifting.    Time  6    Period  Weeks    Status  New      PT LONG TERM GOAL #5   Title  She will report able to use computer with no [name] pain    Time  6    Period  Weeks    Status  New            Plan - 03/10/19 2253    Clinical Impression Statement  Pt tolerating therapy well today, pt reporting no pain initially at rest, but pt reporting pain in left elbow and left upper trap with reaching activities. Pt reporting her pain in the left elbow radiates down into her 1st digit. 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 Pt agreeable to possibility to try at next session. Pt reporting no pain at end of session. Continue skilled PT to progress toward goals set at initial eval.    Personal Factors and Comorbidities  Past/Current Experience;Time since onset of injury/illness/exacerbation    Examination-Activity Limitations  Lift    Examination-Participation Restrictions  Other    Stability/Clinical Decision Making  Stable/Uncomplicated    Rehab Potential  Good    PT Frequency  2x / week    PT Duration  6 weeks    PT Treatment/Interventions  Taping;Passive range of motion;Dry needling;Patient/family education;Therapeutic exercise;Ultrasound;Traction;Moist Heat;Iontophoresis 4mg /ml Dexamethasone;Cryotherapy;Manual techniques    PT Next Visit Plan  manual and modalities for pain and ROM .   Possibe traction . Ionto or tape to LT elbow.    PT Home Exercise Plan  Cervical flexion and rotation , wrist extension stretch, ice Lt elbow    Consulted and Agree with Plan of Care  Patient        Patient will benefit from skilled therapeutic intervention in order to improve the following deficits and impairments:  Pain, Increased muscle spasms, Impaired UE functional use, Decreased range of motion, Decreased activity tolerance, Postural dysfunction  Visit Diagnosis: Cervicalgia  Pain in left elbow  Stiffness of neck  Abnormal posture     Problem List Patient Active Problem List   Diagnosis Date Noted  . H. pylori infection 12/21/2016  . Anemia 12/12/2016  . Abnormal brain MRI 09/25/2016  . Numbness and tingling in left hand 09/25/2016  . Cervical radiculopathy at C5 09/25/2016  . Neck pain 09/25/2016  . 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 Aortic root dilatation ([place]) 08/07/2016  . Ear pain 07/22/2016  . Hyperlipemia 12/02/2015  . H/O splenectomy 12/02/2015  . Hyperglycemia 01/27/2014  . Obesity 01/27/2014  . Unspecified vitamin D deficiency 09/11/2012    Oretha Caprice, PT 03/10/2019, 10:58 PM  [facility] [address] [place], [place], [address] Phone: [phone]   Fax:  [phone]  Name: Deanna [name] MRN: [id number] Date of Birth: 10-26-1960

## 2019-03-12 ENCOUNTER — Other Ambulatory Visit: Payer: Self-pay

## 2019-03-12 ENCOUNTER — Ambulatory Visit: Payer: Managed Care, Other (non HMO) | Admitting: Physical Therapy

## 2019-03-12 ENCOUNTER — Encounter: Payer: Self-pay | Admitting: Physical Therapy

## 2019-03-12 DIAGNOSIS — M25522 Pain in left elbow: Secondary | ICD-10-CM

## 2019-03-12 DIAGNOSIS — M436 Torticollis: Secondary | ICD-10-CM

## 2019-03-12 DIAGNOSIS — R293 Abnormal posture: Secondary | ICD-10-CM

## 2019-03-12 DIAGNOSIS — M542 Cervicalgia: Secondary | ICD-10-CM

## 2019-03-12 NOTE — Therapy (Signed)
[place], [place], [address] Phone: [phone]   Fax:  [phone]  Physical Therapy Treatment  Patient Details  Name: Deanna [name] MRN: [id number] Date of Birth: 1961/04/09 Referring Provider (PT): Ellouise Newer, MD   Encounter Date: 03/12/2019  PT End of Session - 03/12/19 1530    Visit Number  3    Number of Visits  12    Date for PT Re-Evaluation  04/04/19    PT Start Time  [id number]    PT Stop Time  1535    PT Time Calculation (min)  50 min    Activity Tolerance  Patient tolerated treatment well;No increased pain    Behavior During Therapy  WFL for tasks assessed/performed       Past Medical History:  Diagnosis Date  . Allergy   . GERD (gastroesophageal reflux disease)    "does not need meds anymore"  . Heart murmur   . Hyperlipidemia   . Lactose intolerance   . 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HCC)     Past Surgical History:  Procedure Laterality Date  . ABDOMINAL HYSTERECTOMY  2006   benign tumor, complete  . KNEE ARTHROSCOPY Right   . NASAL SEPTUM SURGERY    . 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  Subjective Assessment - 03/12/19 1527    Subjective  Pt arriving to therpay reporting pain in her neck and R upper trap. Pt did report her left elbow felt better today.    Pertinent History  CVA,   No joint surgeries.    Limitations  Lifting;House hold activities    Diagnostic tests  Xrays not done recently.    Currently in Pain?  Yes    Pain Score  3     Pain Location  Neck    Pain Orientation  Right;Left    Pain Type  Chronic pain    Pain Onset  More than a month ago                       [facility] Adult PT Treatment/Exercise - 03/12/19 0001      Neck Exercises: Standing   Other Standing Exercises  rows: red theraband x 15 reps, shoulder extension with red theraband x 15 reps    Other Standing Exercises   cervical retraction x 10 holding 5 seconds      Neck Exercises: Supine   Neck Retraction  10 reps;5 secs    Lateral Flexion  Right;Left;5 reps;Limitations    Other Supine Exercise  shoulder flexion with cane x 20 reps, serratus punches with cane x 20 reps    Other Supine Exercise  wall angels x 10 unable to keep her elbows on wall the entire ROM      Modalities   Modalities  Moist Heat      Moist Heat Therapy   Number Minutes Moist Heat  10 Minutes    Moist Heat Location  Cervical      Manual Therapy   Manual Therapy  Soft tissue mobilization;Passive ROM    Manual therapy comments  occipital release, radial nerve flossing    Soft tissue mobilization  IASTM: bilateral upper traps    Passive ROM  L shoulder, L elbow, cervical spine      Neck Exercises: Stretches   Upper Trapezius Stretch  Right;Left;2 reps;20 seconds    Levator Stretch  Right;Left;2 reps;20  seconds               PT Short Term Goals - 03/12/19 1629      PT SHORT TERM GOAL #1   Title  She will be indpendent with initial hEP    Time  3    Period  Weeks    Status  On-going      PT SHORT TERM GOAL #2   Title  report 30% reduction in cervical pain and stiffness and arm pain /symptoms after sitting long periods for work and lifting    Time  3    Period  Weeks    Status  New      PT SHORT TERM GOAL #3   Title  cervical rotation with be equal at 70 degree s or better    Time  3    Period  Weeks    Status  New        PT Long Term Goals - 02/20/19 1436      PT LONG TERM GOAL #1   Title  be independent in advanced HEP    Time  6    Period  Weeks    Status  New      PT LONG TERM GOAL #2   Title  reduce FOTO to < or = to 26% limitation    Time  6    Period  Weeks    Status  New      PT LONG TERM GOAL #3   Title  demonstrate correct posture and body mechanics for ADLs and home tasks to reduce cervical stress    Time  6    Period  Weeks    Status  New      PT LONG TERM GOAL #4   Title   report a 75% reduction in cervical pain  and LR arm symptoms with home tasks and lifting.    Time  6    Period  Weeks    Status  New      PT LONG TERM GOAL #5   Title  She will report able to use computer with no [name] pain    Time  6    Period  Weeks    Status  New            Plan - 03/12/19 1626    Clinical Impression Statement  Pt tolerating all exercises today, no pain in left elbow reported. Pt tolerated nerve flossing on L UE after reporting numbness in fingers while performing wall angels. After resting pt's numbness subsided. Pt reporting releif after flossing and IASTM to bilateral upper trap. Continue skilled PT.    Personal Factors and Comorbidities  Past/Current Experience;Time since onset of injury/illness/exacerbation    Examination-Activity Limitations  Lift    Examination-Participation Restrictions  Other    Stability/Clinical Decision Making  Stable/Uncomplicated    Rehab Potential  Good    PT Frequency  2x / week    PT Duration  6 weeks    PT Treatment/Interventions  Taping;Passive range of motion;Dry needling;Patient/family education;Therapeutic exercise;Ultrasound;Traction;Moist Heat;Iontophoresis 4mg /ml Dexamethasone;Cryotherapy;Manual techniques    PT Next Visit Plan  manual and modalities for pain and ROM .   Possibe traction . Ionto or tape to LT elbow.    PT Home Exercise Plan  Cervical flexion and rotation , wrist extension stretch, ice Lt elbow    Consulted and Agree with Plan of Care  Patient       Patient will  benefit from skilled therapeutic intervention in order to improve the following deficits and impairments:  Pain, Increased muscle spasms, Impaired UE functional use, Decreased range of motion, Decreased activity tolerance, Postural dysfunction  Visit Diagnosis: Cervicalgia  Pain in left elbow  Stiffness of neck  Abnormal posture     Problem List Patient Active Problem List   Diagnosis Date Noted  . H. pylori infection 12/21/2016   . Anemia 12/12/2016  . Abnormal brain MRI 09/25/2016  . Numbness and tingling in left hand 09/25/2016  . Cervical radiculopathy at C5 09/25/2016  . Neck pain 09/25/2016  . 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 Aortic root dilatation ([place]) 08/07/2016  . Ear pain 07/22/2016  . Hyperlipemia 12/02/2015  . H/O splenectomy 12/02/2015  . Hyperglycemia 01/27/2014  . Obesity 01/27/2014  . Unspecified vitamin D deficiency 09/11/2012    Oretha Caprice, PT 03/12/2019, 4:35 PM  [facility] [address] [place], [place], [address] Phone: [phone]   Fax:  [phone]  Name: Deanna [name] MRN: [id number] Date of Birth: 04/21/1961

## 2019-03-13 ENCOUNTER — Telehealth: Payer: Self-pay | Admitting: Neurology

## 2019-03-13 NOTE — Telephone Encounter (Signed)
Patient returned a call to the office regarding her medication Lyrica. She said that it is working much better than the Gabapentin. Thank you

## 2019-03-18 ENCOUNTER — Other Ambulatory Visit: Payer: Self-pay

## 2019-03-18 ENCOUNTER — Ambulatory Visit: Payer: Managed Care, Other (non HMO) | Attending: Neurology

## 2019-03-18 DIAGNOSIS — M542 Cervicalgia: Secondary | ICD-10-CM | POA: Insufficient documentation

## 2019-03-18 DIAGNOSIS — M436 Torticollis: Secondary | ICD-10-CM | POA: Insufficient documentation

## 2019-03-18 DIAGNOSIS — R293 Abnormal posture: Secondary | ICD-10-CM | POA: Diagnosis present

## 2019-03-18 DIAGNOSIS — M25522 Pain in left elbow: Secondary | ICD-10-CM | POA: Insufficient documentation

## 2019-03-18 NOTE — Therapy (Addendum)
[place], [place], [address] Phone: [phone]   Fax:  [phone]  Physical Therapy Treatment  Patient Details  Name: Deanna [name] MRN: [id number] Date of Birth: 1960-09-12 Referring Provider (PT): Ellouise Newer, MD   Encounter Date: 03/18/2019  PT End of Session - 03/18/19 1240    Visit Number  4    Number of Visits  12    Date for PT Re-Evaluation  04/04/19    Authorization Type  [employer]    PT Start Time  1223    PT Stop Time  [id number]    PT Time Calculation (min)  42 min    Activity Tolerance  Patient tolerated treatment well;No increased pain    Behavior During Therapy  WFL for tasks assessed/performed       Past Medical History:  Diagnosis Date  . Allergy   . GERD (gastroesophageal reflux disease)    "does not need meds anymore"  . Heart murmur   . Hyperlipidemia   . Lactose intolerance   . 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HCC)     Past Surgical History:  Procedure Laterality Date  . ABDOMINAL HYSTERECTOMY  2006   benign tumor, complete  . KNEE ARTHROSCOPY Right   . NASAL SEPTUM SURGERY    . 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  Subjective Assessment - 03/18/19 1223    Subjective  She She reports neck pain about the same. Massage helps and makes it feel better but it doesn't stay bettter.    Currently in Pain?  No/denies                       [facility] Adult PT Treatment/Exercise - 03/18/19 0001      Neck Exercises: Machines for Strengthening   UBE (Upper Arm Bike)  L1 3 min forward 2 min back      Neck Exercises: Standing   Other Standing Exercises  rows: green theraband x 20  reps, shoulder extension with red theraband x 15 reps    Other Standing Exercises  cervical retraction x 10 holding 5 seconds then 10 wall angels      Neck Exercises: Seated   Lateral Flexion  Right;Left;5 reps      Neck  Exercises: Supine   Neck Retraction Limitations  3 reps 5 sec then did with arm movements    Other Supine Exercise  shoulder flexion with cane x 20 reps, serratus punches with cane x 20 reps       Manual STW to neck and traps.       PT Education - 03/18/19 1240    Education Details  cervical stabilization exercises    Person(s) Educated  Patient    Methods  Explanation;Tactile cues;Verbal cues;Handout    Comprehension  Returned demonstration;Verbalized understanding       PT Short Term Goals - 03/18/19 1329      PT SHORT TERM GOAL #1   Title  She will be independent with initial hEP    Status  On-going      PT SHORT TERM GOAL #2   Title  report 30% reduction in cervical pain and stiffness and arm pain /symptoms after sitting long periods for work and lifting    Baseline  reports no change    Status  On-going      PT SHORT TERM GOAL #3   Title  cervical rotation with be equal at 70 degree s or better    Status  On-going        PT Long Term Goals - 02/20/19 1436      PT LONG TERM GOAL #1   Title  be independent in advanced HEP    Time  6    Period  Weeks    Status  New      PT LONG TERM GOAL #2   Title  reduce FOTO to < or = to 26% limitation    Time  6    Period  Weeks    Status  New      PT LONG TERM GOAL #3   Title  demonstrate correct posture and body mechanics for ADLs and home tasks to reduce cervical stress    Time  6    Period  Weeks    Status  New      PT LONG TERM GOAL #4   Title  report a 75% reduction in cervical pain  and LR arm symptoms with home tasks and lifting.    Time  6    Period  Weeks    Status  New      PT LONG TERM GOAL #5   Title  She will report able to use computer with no [name] pain    Time  6    Period  Weeks    Status  New            Plan - 03/18/19 1327    Clinical Impression Statement  She reported she was not better but  tolerated all exercises without pain. she only reported some tightness on the Lt neck  during the STW. We will contnue to progress stab exercises and ROM    PT Treatment/Interventions  Taping;Passive range of motion;Dry needling;Patient/family education;Therapeutic exercise;Ultrasound;Traction;Moist Heat;Iontophoresis 4mg /ml Dexamethasone;Cryotherapy;Manual techniques    PT Next Visit Plan  manual and modalities for pain and ROM .   Possibe traction . Ionto or tape to LT elbow.             Measure ROM    PT Home Exercise Plan  Cervical flexion and rotation , wrist extension stretch, ice Lt elbow,   Level 1 cervical stab exercise in supine    Consulted and Agree with Plan of Care  Patient       Patient will benefit from skilled therapeutic intervention in order to improve the following deficits and impairments:  Pain, Increased muscle spasms, Impaired UE functional use, Decreased range of motion, Decreased activity tolerance, Postural dysfunction  Visit Diagnosis: Cervicalgia  Pain in left elbow  Stiffness of neck  Abnormal posture     Problem List Patient Active Problem List   Diagnosis Date Noted  . H. pylori infection 12/21/2016  . Anemia 12/12/2016  . Abnormal brain MRI 09/25/2016  . Numbness and tingling in left hand 09/25/2016  . Cervical radiculopathy at C5 09/25/2016  . Neck pain 09/25/2016  . 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 Aortic root dilatation ([place]) 08/07/2016  . Ear pain 07/22/2016  . Hyperlipemia 12/02/2015  . H/O splenectomy 12/02/2015  . Hyperglycemia 01/27/2014  . Obesity 01/27/2014  . Unspecified vitamin D deficiency 09/11/2012    [name]  PT 03/18/2019, 1:35 PM  [facility] [address] [place], [place], [address] Phone: [phone]   Fax:  [phone]  Name: Deanna [name] MRN: [id number] Date of Birth: [birth year]

## 2019-03-18 NOTE — Patient Instructions (Signed)
  Cervical: Stabilization  Copyright  [employer]. All rights reserved.  From cabinet Cervical retraction and chin tuck  With bilateral shoulder flexion , shoulder horizontal abduction ,  Arm circles   X 10

## 2019-03-20 ENCOUNTER — Ambulatory Visit: Payer: Managed Care, Other (non HMO)

## 2019-03-20 ENCOUNTER — Other Ambulatory Visit: Payer: Self-pay

## 2019-03-20 DIAGNOSIS — M542 Cervicalgia: Secondary | ICD-10-CM

## 2019-03-20 DIAGNOSIS — M436 Torticollis: Secondary | ICD-10-CM

## 2019-03-20 DIAGNOSIS — M25522 Pain in left elbow: Secondary | ICD-10-CM

## 2019-03-20 DIAGNOSIS — R293 Abnormal posture: Secondary | ICD-10-CM

## 2019-03-20 NOTE — Therapy (Signed)
[place], [place], [address] Phone: [phone]   Fax:  [phone]  Physical Therapy Treatment  Patient Details  Name: Deanna [name] MRN: [id number] Date of Birth: 04-Jul-1960 Referring Provider (PT): Ellouise Newer, MD   Encounter Date: 03/20/2019  PT End of Session - 03/20/19 1221    Visit Number  5    Number of Visits  12    Date for PT Re-Evaluation  04/04/19    Authorization Type  [employer]    PT Start Time  1223   late   PT Stop Time  1301    PT Time Calculation (min)  38 min    Activity Tolerance  Patient tolerated treatment well;No increased pain    Behavior During Therapy  WFL for tasks assessed/performed       Past Medical History:  Diagnosis Date  . Allergy   . GERD (gastroesophageal reflux disease)    "does not need meds anymore"  . Heart murmur   . Hyperlipidemia   . Lactose intolerance   . 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HCC)     Past Surgical History:  Procedure Laterality Date  . ABDOMINAL HYSTERECTOMY  2006   benign tumor, complete  . KNEE ARTHROSCOPY Right   . NASAL SEPTUM SURGERY    . 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  Subjective Assessment - 03/20/19 1313    Subjective  No pain today  . stiffness.    Currently in Pain?  No/denies                       [facility] Adult PT Treatment/Exercise - 03/20/19 0001      Neck Exercises: Machines for Strengthening   UBE (Upper Arm Bike)  L2 3 min forward 3 min back      Neck Exercises: Standing   Other Standing Exercises  rows: green theraband x 20  reps, shoulder extension with green theraband x 20 reps      Neck Exercises: Supine   Other Supine Exercise  with chin tuck shoulder flexion, hor abduction,  circles 2x10 reps holding 2# weight    Other Supine Exercise  chin tuck hor abduction  and ER  green band   then chin tucks with abduction  x  10  2 sets      Neck Exercises: Sidelying   Other Sidelying Exercise  arm openers RT and x 10      Manual Therapy   Manual therapy comments  manual traction after isometrics x 10 rotation and sidebendingx 15 reps.             PT Education - 03/20/19 1257    Education Details  arm openers    Person(s) Educated  Patient    Methods  Explanation;Tactile cues;Verbal cues;Handout    Comprehension  Returned demonstration;Verbalized understanding       PT Short Term Goals - 03/20/19 1310      PT SHORT TERM GOAL #1   Title  She will be independent with initial hEP    Status  Achieved      PT SHORT TERM GOAL #2   Title  report 30% reduction in cervical pain and stiffness and arm pain /symptoms after sitting long periods for work and lifting    Baseline  reports no change    Status  On-going        PT  Long Term Goals - 02/20/19 1436      PT LONG TERM GOAL #1   Title  be independent in advanced HEP    Time  6    Period  Weeks    Status  New      PT LONG TERM GOAL #2   Title  reduce FOTO to < or = to 26% limitation    Time  6    Period  Weeks    Status  New      PT LONG TERM GOAL #3   Title  demonstrate correct posture and body mechanics for ADLs and home tasks to reduce cervical stress    Time  6    Period  Weeks    Status  New      PT LONG TERM GOAL #4   Title  report a 75% reduction in cervical pain  and LR arm symptoms with home tasks and lifting.    Time  6    Period  Weeks    Status  New      PT LONG TERM GOAL #5   Title  She will report able to use computer with no [name] pain    Time  6    Period  Weeks    Status  New            Plan - 03/20/19 1221    Clinical Impression Statement  No pain to start today. By end she had no pain but reported stiffness in bilateral traps .   Will cont strength but add some STW /stretch manual next session.  She declined heat having to go back to work    PT Treatment/Interventions  Taping;Passive range of motion;Dry  needling;Patient/family education;Therapeutic exercise;Ultrasound;Traction;Moist Heat;Iontophoresis 4mg /ml Dexamethasone;Cryotherapy;Manual techniques    PT Next Visit Plan  manual and modalities for pain and ROM .   Possibe traction . Ionto or tape to LT elbow.             Measure ROM    PT Home Exercise Plan  Cervical flexion and rotation , wrist extension stretch, ice Lt elbow,   Level 1 cervical stab exercise in supine    Consulted and Agree with Plan of Care  Patient       Patient will benefit from skilled therapeutic intervention in order to improve the following deficits and impairments:  Pain, Increased muscle spasms, Impaired UE functional use, Decreased range of motion, Decreased activity tolerance, Postural dysfunction  Visit Diagnosis: Cervicalgia  Pain in left elbow  Stiffness of neck  Abnormal posture     Problem List Patient Active Problem List   Diagnosis Date Noted  . H. pylori infection 12/21/2016  . Anemia 12/12/2016  . Abnormal brain MRI 09/25/2016  . Numbness and tingling in left hand 09/25/2016  . Cervical radiculopathy at C5 09/25/2016  . Neck pain 09/25/2016  . 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 Aortic root dilatation ([place]) 08/07/2016  . Ear pain 07/22/2016  . Hyperlipemia 12/02/2015  . H/O splenectomy 12/02/2015  . Hyperglycemia 01/27/2014  . Obesity 01/27/2014  . Unspecified vitamin D deficiency 09/11/2012    Deanna [name]  PT 03/20/2019, 1:15 PM  [facility] [address] [place], [place], [address] Phone: [phone]   Fax:  [phone]  Name: Deanna [name] MRN: [id number] Date of Birth: 13-Apr-1961

## 2019-03-20 NOTE — Patient Instructions (Signed)
Arm /chest openers  For thoracic rotation x 10 RT/LT 1-2 x/day

## 2019-03-25 ENCOUNTER — Other Ambulatory Visit: Payer: Self-pay

## 2019-03-25 ENCOUNTER — Ambulatory Visit: Payer: Managed Care, Other (non HMO)

## 2019-03-25 DIAGNOSIS — M436 Torticollis: Secondary | ICD-10-CM

## 2019-03-25 DIAGNOSIS — R293 Abnormal posture: Secondary | ICD-10-CM

## 2019-03-25 DIAGNOSIS — M542 Cervicalgia: Secondary | ICD-10-CM

## 2019-03-25 NOTE — Patient Instructions (Signed)
Posterior shoulder and thoracic  5-7 days per weekrotation stretch.  2x/day 2-3 reps 15-30 sec stretch and bilateral flexion with band pull  X 10 1-2x/day

## 2019-03-25 NOTE — Therapy (Signed)
[place], [place], [address] Phone: [phone]   Fax:  [phone]  Physical Therapy Treatment  Patient Details  Name: Deanna [name] MRN: [id number] Date of Birth: Mar 01, 1961 Referring Provider (PT): Ellouise Newer, MD   Encounter Date: 03/25/2019  PT End of Session - 03/25/19 1217    Visit Number  6    Number of Visits  12    Date for PT Re-Evaluation  04/04/19    Authorization Type  [employer]ign[employer]    PT Start Time  1215    PT Stop Time  1300    PT Time Calculation (min)  45 min    Activity Tolerance  Patient tolerated treatment well;No increased pain    Behavior During Therapy  WFL for tasks assessed/performed       Past Medical History:  Diagnosis Date  . Allergy   . GERD (gastroesophageal reflux disease)    "does not need meds anymore"  . Heart murmur   . Hyperlipidemia   . Lactose intolerance   . 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HCC)     Past Surgical History:  Procedure Laterality Date  . ABDOMINAL HYSTERECTOMY  2006   benign tumor, complete  . KNEE ARTHROSCOPY Right   . NASAL SEPTUM SURGERY    . 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  Subjective Assessment - 03/25/19 1219    Subjective  No pain , stiffness.    Currently in Pain?  No/denies         [facility] PT Assessment - 03/25/19 0001      AROM   Cervical Flexion  45    Cervical Extension  70    Cervical - Right Side Bend  28    Cervical - Left Side Bend  30    Cervical - Right Rotation  65    Cervical - Left Rotation  68                   [facility] Adult PT Treatment/Exercise - 03/25/19 0001      Self-Care   Self-Care  Posture    Posture  discusse using support in lower back or upper back with rolled jacket or pillow  to support spine and relax muscles to decrease tension in neck and back with prolonged sitting      Neck Exercises: Machines for  Strengthening   UBE (Upper Arm Bike)  L2 3 min forward 3 min back      Neck Exercises: Standing   Other Standing Exercises  10 retractions with 10 wall angels.  then face wall yellow band pullls with flexion  overhead and lift off  wall x 10      Neck Exercises: Seated   Lateral Flexion  Right;Left    Lateral Flexion Limitations  3 reps 30 sec  then levator stretch 20 sec x 3 RT/LT    Other Seated Exercise  cervical rotation SNAGS with towel. RT/ LT     Other Seated Exercise  Thoracic rotation RT?LT x 3 20 sec  with PT overpressure       Manual Therapy   Manual Therapy  Joint mobilization    Soft tissue mobilization  traps and neck    Passive ROM  PA glides GR 3-4 to neck and thoracic spine             PT Education - 03/25/19 1308  Education Details  HEP. proper sitting posture with support to spine    Person(s) Educated  Patient    Methods  Explanation;Demonstration;Tactile cues;Verbal cues;Handout    Comprehension  Returned demonstration;Verbalized understanding       PT Short Term Goals - 03/20/19 1310      PT SHORT TERM GOAL #1   Title  She will be independent with initial hEP    Status  Achieved      PT SHORT TERM GOAL #2   Title  report 30% reduction in cervical pain and stiffness and arm pain /symptoms after sitting long periods for work and lifting    Baseline  reports no change    Status  On-going        PT Long Term Goals - 02/20/19 1436      PT LONG TERM GOAL #1   Title  be independent in advanced HEP    Time  6    Period  Weeks    Status  New      PT LONG TERM GOAL #2   Title  reduce FOTO to < or = to 26% limitation    Time  6    Period  Weeks    Status  New      PT LONG TERM GOAL #3   Title  demonstrate correct posture and body mechanics for ADLs and home tasks to reduce cervical stress    Time  6    Period  Weeks    Status  New      PT LONG TERM GOAL #4   Title  report a 75% reduction in cervical pain  and LR arm symptoms with home  tasks and lifting.    Time  6    Period  Weeks    Status  New      PT LONG TERM GOAL #5   Title  She will report able to use computer with no [name] pain    Time  6    Period  Weeks    Status  New            Plan - 03/25/19 1217    Clinical Impression Statement  Looser with no pain post session. Tolerated new exercises.  posure awareness exer and proper back support.    PT Treatment/Interventions  Taping;Passive range of motion;Dry needling;Patient/family education;Therapeutic exercise;Ultrasound;Traction;Moist Heat;Iontophoresis 4mg /ml Dexamethasone;Cryotherapy;Manual techniques    PT Next Visit Plan  manual and modalities for pain and ROM .   Possible traction . Marland Kitchen    PT Home Exercise Plan  Cervical flexion and rotation , wrist extension stretch, ice Lt elbow,   Level 1 cervical stab exercise in supine    Consulted and Agree with Plan of Care  Patient       Patient will benefit from skilled therapeutic intervention in order to improve the following deficits and impairments:  Pain, Increased muscle spasms, Impaired UE functional use, Decreased range of motion, Decreased activity tolerance, Postural dysfunction  Visit Diagnosis: Cervicalgia  Stiffness of neck  Abnormal posture     Problem List Patient Active Problem List   Diagnosis Date Noted  . H. pylori infection 12/21/2016  . Anemia 12/12/2016  . Abnormal brain MRI 09/25/2016  . Numbness and tingling in left hand 09/25/2016  . Cervical radiculopathy at C5 09/25/2016  . Neck pain 09/25/2016  . 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 Aortic root dilatation ([place]) 08/07/2016  . Ear pain 07/22/2016  . Hyperlipemia 12/02/2015  .  H/O splenectomy 12/02/2015  . Hyperglycemia 01/27/2014  . Obesity 01/27/2014  . Unspecified vitamin D deficiency 09/11/2012    [name]  PT 03/25/2019, 1:12 PM  [facility] [address] [place], [place], [address] Phone:  [phone]   Fax:  [phone]  Name: Deanna [name] MRN: [id number] Date of Birth: 21-May-1960

## 2019-03-27 ENCOUNTER — Other Ambulatory Visit: Payer: Self-pay

## 2019-03-27 ENCOUNTER — Ambulatory Visit: Payer: Managed Care, Other (non HMO)

## 2019-03-27 DIAGNOSIS — R293 Abnormal posture: Secondary | ICD-10-CM

## 2019-03-27 DIAGNOSIS — M542 Cervicalgia: Secondary | ICD-10-CM | POA: Diagnosis not present

## 2019-03-27 DIAGNOSIS — M436 Torticollis: Secondary | ICD-10-CM

## 2019-03-27 NOTE — Therapy (Addendum)
[place], [place], [address] Phone: [phone]   Fax:  [phone]  Physical Therapy Treatment/Discharge  Patient Details  Name: Deanna [name] MRN: [id number] Date of Birth: [birth year] Referring Provider (PT): Ellouise Newer, MD   Encounter Date: 03/27/2019  PT End of Session - 03/27/19 1402    Visit Number  7    Number of Visits  12    Date for PT Re-Evaluation  04/04/19    Authorization Type  [employer]igna    PT Start Time  0202    PT Stop Time  9767    PT Time Calculation (min)  43 min    Activity Tolerance  Patient tolerated treatment well;No increased pain    Behavior During Therapy  WFL for tasks assessed/performed       Past Medical History:  Diagnosis Date  . Allergy   . GERD (gastroesophageal reflux disease)    "does not need meds anymore"  . Heart murmur   . Hyperlipidemia   . Lactose intolerance   . 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HCC)     Past Surgical History:  Procedure Laterality Date  . ABDOMINAL HYSTERECTOMY  2006   benign tumor, complete  . KNEE ARTHROSCOPY Right   . NASAL SEPTUM SURGERY    . 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  Subjective Assessment - 03/27/19 1404    Subjective  No pain . Fel pretty good today.    Currently in Pain?  No/denies                       [facility] Adult PT Treatment/Exercise - 03/27/19 0001      Neck Exercises: Machines for Strengthening   UBE (Upper Arm Bike)  L3    3 min forward 3 min back      Neck Exercises: Prone   Neck Retraction Limitations  with lifting  reps    W Back  15 reps    Shoulder Extension  15 reps      Neck Exercises: Stretches   Upper Trapezius Stretch  Right;Left;2 reps;20 seconds    Levator Stretch  Right;Left;2 reps;20 seconds    Chest Stretch  2 reps;20 seconds    Other Neck Stretches  cross body stretch RT/LT x 2 30 sec         Manual PA glides to thoracic and cervical spine 2-4GR  Central and lateral.    STW to neck and upper back         PT Short Term Goals - 03/27/19 1508      PT SHORT TERM GOAL #1   Title  She will be independent with initial hEP    Status  Achieved      PT SHORT TERM GOAL #2   Title  report 30% reduction in cervical pain and stiffness and arm pain /symptoms after sitting long periods for work and lifting    Baseline  Still pain with lifting but comes in now reporting no painto start session    Status  On-going      PT SHORT TERM GOAL #3   Title  cervical rotation with be equal at 70 degree s or better    Baseline  in mid 60s now    Status  On-going        PT Long Term Goals - 03/27/19 1512      PT  LONG TERM GOAL #1   Title  be independent in advanced HEP    Status  Achieved      PT LONG TERM GOAL #2   Title  reduce FOTO to < or = to 26% limitation    Baseline  32% so no change but expectation was only 2 point change    Status  On-going      PT LONG TERM GOAL #3   Title  demonstrate correct posture and body mechanics for ADLs and home tasks to reduce cervical stress    Baseline  She verbalizes understanding of proper posture for her    Status  Achieved      PT LONG TERM GOAL #4   Title  report a 75% reduction in cervical pain  and LR arm symptoms with home tasks and lifting.    Status  On-going      PT LONG TERM GOAL #5   Title  She will report able to use computer with no [name] pain    Status  On-going            Plan - 03/27/19 1403    Clinical Impression Statement  Ms Michie is doing better coming in to PT with no reports of pain. She is tender at the cervicl- thoracic junction .   She reports she will se Dr Delice Lesch 12/15 so she optted to continue her HEP and decide at that time if she needs mot PT. She will be out of town for a period between now and then and she opted to not schedule more appointments .  I told her I would put her on hold until  then and she will call to schedule or discharge at that time.    PT Treatment/Interventions  Taping;Passive range of motion;Dry needling;Patient/family education;Therapeutic exercise;Ultrasound;Traction;Moist Heat;Iontophoresis 41m/ml Dexamethasone;Cryotherapy;Manual techniques    PT Next Visit Plan  Contnue with HEP . Hold until she returns to [place] in December    PT Home Exercise Plan  Cervical flexion and rotation , wrist extension stretch, ice Lt elbow,   Level 1 cervical stab exercise in supine    Consulted and Agree with Plan of Care  Patient       Patient will benefit from skilled therapeutic intervention in order to improve the following deficits and impairments:  Pain, Increased muscle spasms, Impaired UE functional use, Decreased range of motion, Decreased activity tolerance, Postural dysfunction  Visit Diagnosis: Abnormal posture  Stiffness of neck     Problem List Patient Active Problem List   Diagnosis Date Noted  . H. pylori infection 12/21/2016  . Anemia 12/12/2016  . Abnormal brain MRI 09/25/2016  . Numbness and tingling in left hand 09/25/2016  . Cervical radiculopathy at C5 09/25/2016  . Neck pain 09/25/2016  . 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 [place] 08/07/2016  . Ear pain 07/22/2016  . Hyperlipemia 12/02/2015  . H/O splenectomy 12/02/2015  . Hyperglycemia 01/27/2014  . Obesity 01/27/2014  . Unspecified vitamin D deficiency 09/11/2012    CDarrel Hoover PT 03/27/2019, 3:15 PM  [facility] [address]Phone: [phone]  Fax:  [phone] Name: DSHECCID LAHMANNMRN: [id number] of Birth: 903/07/1962PHYSICAL THERAPY DISCHARGE SUMMARY  Visits from Start of Care:7 Current functional level related to goals / functional outcomes: See above She has seen Dr ADelice Leschand continues to do well and has not returned   Remaining  deficits: See above    Education / Equipment: HEP Plan: Patient agrees to discharge.  Patient goals were partially met. Patient is being discharged due to being pleased with the current functional level.  ?????    Pearson Forster Pt 05/13/19

## 2019-04-30 ENCOUNTER — Telehealth (INDEPENDENT_AMBULATORY_CARE_PROVIDER_SITE_OTHER): Payer: Managed Care, Other (non HMO) | Admitting: Neurology

## 2019-04-30 ENCOUNTER — Other Ambulatory Visit: Payer: Self-pay

## 2019-04-30 ENCOUNTER — Encounter: Payer: Self-pay | Admitting: Neurology

## 2019-04-30 VITALS — Ht 69.0 in | Wt 218.0 lb

## 2019-04-30 DIAGNOSIS — Z7902 Long term (current) use of antithrombotics/antiplatelets: Secondary | ICD-10-CM | POA: Diagnosis not present

## 2019-04-30 DIAGNOSIS — M5412 Radiculopathy, cervical region: Secondary | ICD-10-CM

## 2019-04-30 DIAGNOSIS — R200 Anesthesia of skin: Secondary | ICD-10-CM

## 2019-04-30 MED ORDER — PREGABALIN 50 MG PO CAPS: ORAL_CAPSULE | ORAL | 3 refills | Status: DC

## 2019-04-30 NOTE — Progress Notes (Signed)
Virtual Visit via Video Note The purpose of this virtual visit is to provide medical care while limiting exposure to the novel coronavirus.    Consent was obtained for video visit:  Yes.   Answered questions that patient had about telehealth interaction:  Yes.   I discussed the limitations, risks, security and privacy concerns of performing an evaluation and management service by telemedicine. I also discussed with the patient that there may be a patient responsible charge related to this service. The patient expressed understanding and agreed to proceed.  Pt location: Home Physician Location: office Name of referring provider:  Lucretia Kern, DO I connected with Deanna [name] at patients initiation/request on 04/30/2019 at  4:00 PM EST by video enabled telemedicine application and verified that I am speaking with the correct person using two identifiers. Pt MRN:  [id number] Pt DOB:  [birth year] Video Participants:  Deanna [name]   History of Present Illness:  The patient was seen as a virtual video visit on 04/30/2019. She was last seen 2 months ago for left arm burning/pain. She was also reporting leg pain and back pain. EMG/NCV of the left arm and leg were normal. She has completed physical therapy and reports it helped her a lot. Her arm is feeling much better, no further numbness/tingling. She also had a better response to Pregabalin 50mg  at bedtime with better side effect profile compared to Gabapentin. She continues to have some pain in the back of her neck and continues with home exercise program. She denies any headaches, dizziness, vision changes, focal weakness, no falls.   History on Initial Assessment 07/20/2016: This is a pleasant 58 yo RH woman with a history of hyperlipidemia who reported a 3-year history of intermittent episodes of dizziness described as spinning, as well as feeling lightheaded with blurred vision lasting 20-30 seconds. Recently she started having different  symptoms, around 2 weeks ago she started getting carsick, which is new for her. A week ago, her left hand was numb, "like it was not there," then 20-30 seconds later it started tingling. Face and leg were unaffected. She did not have any neck pain at that time, but 3 days later started having a headache with pressure over the frontal regions that she ascribed to her sinuses. She started having pretty significant intermittent nausea. She continued to have dizziness, last episode was a week ago. The headaches have resolved. Blurred vision still comes and goes. She denies any diplopia, dysarthria/dysphagia, back pain, bowel/bladder dysfunction, negative Lhermitte sign. She denies any prior history of focal symptoms, no prior history of vision loss. She denies any head injuries. 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 There is a family history of stroke.  She had an MRI brain with and without contrast last 07/18/2016 which I personally reviewed, there was a faint punctate focus of reduced diffusion in the right frontal lobe deep white matter, difficult to see on the ADC map due to small size, this can be seen with acute ischemia or hyperacute demyelination. There were at least 5 additional subcentimeter white matter, right parietal, medial left thalamus, and left occipital horn FLAIR hyperintensities. There was mild prominence of the ventricles and sulci for age. No abnormal enhancement seen. 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     Current Outpatient Medications on File Prior to Visit  Medication Sig Dispense Refill  . clopidogrel (PLAVIX) 75 MG tablet TAKE 1 TABLET  BY MOUTH EVERY DAY 90 tablet 3  . pravastatin (PRAVACHOL) 40 MG tablet Take 1 tablet (40 mg total) by mouth daily. 90 tablet 3   No current facility-administered medications on file prior to visit.     Observations/Objective:   Vitals:   04/30/19 1139    Weight: 218 lb (98.9 kg)  Height: 5\' 9"  (1.753 m)   GEN:  The patient appears stated age and is in NAD.  Neurological examination: Patient is awake, alert, oriented x 3. No aphasia or dysarthria. Intact fluency and comprehension. Remote and recent memory intact. Able to name and repeat. Cranial nerves: Extraocular movements intact with no nystagmus. No facial asymmetry. Motor: moves all extremities symmetrically, at least anti-gravity x 4. No incoordination on finger to nose testing. Gait: narrow-based and steady, able to tandem walk adequately. Negative Romberg test.   Assessment and Plan:   This is a pleasant 58 yo RH woman with a history of hyperlipidemia, who presented initially with a 3-year history of intermittent dizziness suggestive of vertigo that had worsened with nausea, headaches, and a transient episode of left hand numbness and tingling. There have been no further episodes of dizziness or headaches. MRI brain in 2018 had shown a faint punctate focus of reduced diffusion in the right frontal lobe deep white matter, difficult to see on the ADC map due to small size, this can be seen with acute ischemia or hyperacute demyelination. Repeat MRI did not show any progression or new changes. Continue Plavix and control of vascular risk facors for secondary stroke prevention. She was reporting left arm pain. Cervical and thoracic MRI in 2018 did not show any demyelinating lesions. There is left C5 radiculopathy on her cervical MRI, EMG/NCV normal. She has had a good response to physical therapy with no further left arm symptoms, continue Pregabalin 50mg  at bedtime. Continue home exercises for neck pain. Follow-up in 6-8 months, she knows to call for any changes.    Follow Up Instructions:   -I discussed the assessment and treatment plan with the patient. The patient was provided an opportunity to ask questions and all were answered. The patient agreed with the plan and demonstrated an  understanding of the instructions.   The patient was advised to call back or seek an in-person evaluation if the symptoms worsen or if the condition fails to improve as anticipated.    Cameron Sprang, MD

## 2019-07-09 ENCOUNTER — Telehealth: Payer: Self-pay | Admitting: Family Medicine

## 2019-07-09 NOTE — Telephone Encounter (Signed)
Pt stated she is allergic to the Flu vaccine and is wondering if she is OK to get the COVID vaccine?  Pt can be reached at [phone] per pt to leave detailed message at this number

## 2019-07-09 NOTE — Telephone Encounter (Signed)
Please advise 

## 2019-07-15 NOTE — Telephone Encounter (Signed)
What is pt' rxn to the influenza vaccine?  If pt is referring to her egg allergy, this is not a contraindication to receiving the COVID vaccine.  Pt should also be advised there are egg free versions of the influenza vaccine.

## 2019-07-16 NOTE — Telephone Encounter (Signed)
Spoke with pt state that she is at work and that she will call the office back regarding the COVID-19 vaccine

## 2019-07-17 ENCOUNTER — Telehealth: Payer: Self-pay

## 2019-07-17 NOTE — Telephone Encounter (Signed)
Spoke with pt verbalized understanding of  Dr Volanda Napoleon advise regarding pt getting her COVID-19 vaccine

## 2019-07-17 NOTE — Telephone Encounter (Signed)
Called pt for f/u no answer. Note will be closed but opened once pt return call.

## 2019-07-23 ENCOUNTER — Other Ambulatory Visit: Payer: Self-pay

## 2019-07-24 ENCOUNTER — Ambulatory Visit (INDEPENDENT_AMBULATORY_CARE_PROVIDER_SITE_OTHER): Payer: 59 | Admitting: Family Medicine

## 2019-07-24 ENCOUNTER — Encounter: Payer: Self-pay | Admitting: Family Medicine

## 2019-07-24 VITALS — BP 110/70 | HR 68 | Temp 97.7°F | Wt 228.0 lb

## 2019-07-24 DIAGNOSIS — B351 Tinea unguium: Secondary | ICD-10-CM

## 2019-07-24 DIAGNOSIS — I83893 Varicose veins of bilateral lower extremities with other complications: Secondary | ICD-10-CM

## 2019-07-24 DIAGNOSIS — R42 Dizziness and giddiness: Secondary | ICD-10-CM

## 2019-07-24 MED ORDER — MECLIZINE HCL 25 MG PO TABS: 25.0000 mg | ORAL_TABLET | Freq: Three times a day (TID) | ORAL | 0 refills | Status: DC | PRN

## 2019-07-24 NOTE — Patient Instructions (Signed)
Vertigo Vertigo is the feeling that you or your surroundings are moving when they are not. This feeling can come and go at any time. Vertigo often goes away on its own. Vertigo can be dangerous if it occurs while you are doing something that could endanger you or others, such as driving or operating machinery. Your health care provider will do tests to determine the cause of your vertigo. Tests will also help your health care provider decide how best to treat your condition. Follow these instructions at home: Eating and drinking      Drink enough fluid to keep your urine pale yellow.  Do not drink alcohol. Activity  Return to your normal activities as told by your health care provider. Ask your health care provider what activities are safe for you.  In the morning, first sit up on the side of the bed. When you feel okay, stand slowly while you hold onto something until you know that your balance is fine.  Move slowly. Avoid sudden body or head movements or certain positions, as told by your health care provider.  If you have trouble walking or keeping your balance, try using a cane for stability. If you feel dizzy or unstable, sit down right away.  Avoid doing any tasks that would cause danger to you or others if vertigo occurs.  Avoid bending down if you feel dizzy. Place items in your home so that they are easy for you to reach without leaning over.  Do not drive or use heavy machinery if you feel dizzy. General instructions  Take over-the-counter and prescription medicines only as told by your health care provider.  Keep all follow-up visits as told by your health care provider. This is important. Contact a health care provider if:  Your medicines do not relieve your vertigo or they make it worse.  You have a fever.  Your condition gets worse or you develop new symptoms.  Your family or friends notice any behavioral changes.  Your nausea or vomiting gets worse.  You  have numbness or a prickling and tingling sensation in part of your body. Get help right away if you:  Have difficulty moving or speaking.  Are always dizzy.  Faint.  Develop severe headaches.  Have weakness in your hands, arms, or legs.  Have changes in your hearing or vision.  Develop a stiff neck.  Develop sensitivity to light. Summary  Vertigo is the feeling that you or your surroundings are moving when they are not.  Your health care provider will do tests to determine the cause of your vertigo.  Follow instructions for home care. You may be told to avoid certain tasks, positions, or movements.  Contact a health care provider if your medicines do not relieve your symptoms, or if you have a fever, nausea, vomiting, or changes in behavior.  Get help right away if you have severe headaches or difficulty speaking, or you develop hearing or vision problems. This information is not intended to replace advice given to you by your health care provider. Make sure you discuss any questions you have with your health care provider. Document Revised: 03/25/2018 Document Reviewed: 03/25/2018 Elsevier Patient Education  [place]. How to Perform the Epley Maneuver The Epley maneuver is an exercise that relieves symptoms of vertigo. Vertigo is the feeling that you or your surroundings are moving when they are not. When you feel vertigo, you may feel like the room is spinning and have trouble walking. Dizziness is  a little different than vertigo. When you are dizzy, you may feel unsteady or light-headed. You can do this maneuver at home whenever you have symptoms of vertigo. You can do it up to 3 times a day until your symptoms go away. Even though the Epley maneuver may relieve your vertigo for a few weeks, it is possible that your symptoms will return. This maneuver relieves vertigo, but it does not relieve dizziness. What are the risks? If it is done correctly, the Epley maneuver  is considered safe. Sometimes it can lead to dizziness or nausea that goes away after a short time. If you develop other symptoms, such as changes in vision, weakness, or numbness, stop doing the maneuver and call your health care provider. How to perform the Epley maneuver 1. Sit on the edge of a bed or table with your back straight and your legs extended or hanging over the edge of the bed or table. 2. Turn your head halfway toward the affected ear or side. 3. Lie backward quickly with your head turned until you are lying flat on your back. You may want to position a pillow under your shoulders. 4. Hold this position for 30 seconds. You may experience an attack of vertigo. This is normal. 5. Turn your head to the opposite direction until your unaffected ear is facing the floor. 6. Hold this position for 30 seconds. You may experience an attack of vertigo. This is normal. Hold this position until the vertigo stops. 7. Turn your whole body to the same side as your head. Hold for another 30 seconds. 8. Sit back up. You can repeat this exercise up to 3 times a day. Follow these instructions at home:  After doing the Epley maneuver, you can return to your normal activities.  Ask your health care provider if there is anything you should do at home to prevent vertigo. He or she may recommend that you: ? Keep your head raised (elevated) with two or more pillows while you sleep. ? Do not sleep on the side of your affected ear. ? Get up slowly from bed. ? Avoid sudden movements during the day. ? Avoid extreme head movement, like looking up or bending over. Contact a health care provider if:  Your vertigo gets worse.  You have other symptoms, including: ? Nausea. ? Vomiting. ? Headache. Get help right away if:  You have vision changes.  You have a severe or worsening headache or neck pain.  You cannot stop vomiting.  You have new numbness or weakness in any part of your  body. Summary  Vertigo is the feeling that you or your surroundings are moving when they are not.  The Epley maneuver is an exercise that relieves symptoms of vertigo.  If the Epley maneuver is done correctly, it is considered safe. You can do it up to 3 times a day. This information is not intended to replace advice given to you by your health care provider. Make sure you discuss any questions you have with your health care provider. Document Revised: 04/13/2017 Document Reviewed: 03/21/2016 Elsevier Patient Education  [place].  Varicose Veins Varicose veins are veins that have become enlarged, bulged, and twisted. They most often appear in the legs. What are the causes? This condition is caused by damage to the valves in the vein. These valves help blood return to your heart. When they are damaged and they stop working properly, blood may flow backward and back up in the veins  near the skin, causing the veins to get larger and appear twisted. The condition can result from any issue that causes blood to back up, like pregnancy, prolonged standing, or obesity. What increases the risk? This condition is more likely to develop in people who are:  On their feet a lot.  Pregnant.  Overweight. What are the signs or symptoms? Symptoms of this condition include:  Bulging, twisted, and bluish veins.  A feeling of heaviness. This may be worse at the end of the day.  Leg pain. This may be worse at the end of the day.  Swelling in the leg.  Changes in skin color over the veins. How is this diagnosed? This condition may be diagnosed based on your symptoms, a physical exam, and an ultrasound test. How is this treated? Treatment for this condition may involve:  Avoiding sitting or standing in one position for long periods of time.  Wearing compression stockings. These stockings help to prevent blood clots and reduce swelling in the legs.  Raising (elevating) the legs when  resting.  Losing weight.  Exercising regularly. If you have persistent symptoms or want to improve the way your varicose veins look, you may choose to have a procedure to close the varicose veins off or to remove them. Treatments to close off the veins include:  Sclerotherapy. In this treatment, a solution is injected into a vein to close it off.  Laser treatment. In this treatment, the vein is heated with a laser to close it off.  Radiofrequency vein ablation. In this treatment, an electrical current produced by radio waves is used to close off the vein. Treatments to remove the veins include:  Phlebectomy. In this treatment, the veins are removed through small incisions made over the veins.  Vein ligation and stripping. In this treatment, incisions are made over the veins. The veins are then removed after being tied (ligated) with stitches (sutures). Follow these instructions at home: Activity  Walk as much as possible. Walking increases blood flow. This helps blood return to the heart and takes pressure off your veins. It also increases your cardiovascular strength.  Follow your health care provider's instructions about exercising.  Do not stand or sit in one position for a long period of time.  Do not sit with your legs crossed.  Rest with your legs raised during the day. General instructions   Follow any diet instructions given to you by your health care provider.  Wear compression stockings as directed by your health care provider. Do not wear other kinds of tight clothing around your legs, pelvis, or waist.  Elevate your legs at night to above the level of your heart.  If you get a cut in the skin over the varicose vein and the vein bleeds: ? Lie down with your leg raised. ? Apply firm pressure to the cut with a clean cloth until the bleeding stops. ? Place a bandage (dressing) on the cut. Contact a health care provider if:  The skin around your varicose veins  starts to break down.  You have pain, redness, tenderness, or hard swelling over a vein.  You are uncomfortable because of pain.  You get a cut in the skin over a varicose vein and it will not stop bleeding. Summary  Varicose veins are veins that have become enlarged, bulged, and twisted. They most often appear in the legs.  This condition is caused by damage to the valves in the vein. These valves help blood return to  your heart.  Treatment for this condition includes frequent movements, wearing compression stockings, losing weight, and exercising regularly. In some cases, procedures are done to close off or remove the veins.  Treatment for this condition may include wearing compression stockings, elevating the legs, losing weight, and engaging in regular activity. In some cases, procedures are done to close off or remove the veins. This information is not intended to replace advice given to you by your health care provider. Make sure you discuss any questions you have with your health care provider. Document Revised: 06/27/2018 Document Reviewed: 05/24/2016 Elsevier Patient Education  2020 [place].  Nonsurgical Procedures for Varicose Veins Various nonsurgical procedures can be used to treat varicose veins. Varicose veins are swollen, twisted veins that are visible under the skin. They occur most often in the legs. These veins may appear blue and bulging. Varicose veins are caused by damage to the valves in veins. All veins have a valve that makes blood flow in only one direction. If a valve gets weak or damaged, blood can pool and cause varicose veins. You may need a procedure to treat your varicose veins if they are causing symptoms or complications, or if lifestyle changes have not helped. These procedures can reduce pain, aching, and the risk of bleeding and blood clots. They can also improve the way the affected area looks (cosmetic appearance). The three common nonsurgical  procedures are:  Sclerotherapy. A chemical is injected to close off a vein.  Laser treatment. Light energy is applied to close off the vein.  Radiofrequency vein ablation. Electrical energy is used to produce heat that closes off the vein. Your health care provider will discuss the method that is best for you based on your condition. Tell a health care provider about:  Any allergies you have.  All medicines you are taking, including vitamins, herbs, eye drops, creams, and over-the-counter medicines.  Any problems you or family members have had with anesthetic medicines.  Any blood disorders you have.  Any surgeries you have had.  Any medical conditions you have.  Whether you are pregnant or may be pregnant. What are the risks? Generally, this is a safe procedure. However, problems may occur, including:  Damage to nearby nerves, tissues, or veins.  Skin irritation, sores, or dark spots.  Numbness.  Clotting.  Infection.  Allergic reactions to medicines.  Scarring.  Leg swelling.  Need for additional treatments.  Bruising. What happens before the procedure?  Ask your health care provider about: ? Changing or stopping your regular medicines. This is especially important if you are taking diabetes medicines or blood thinners. ? Taking over-the-counter medicines, vitamins, herbs, and supplements. ? Taking medicines such as aspirin and ibuprofen. These medicines can thin your blood. Do not take these medicines unless your health care provider tells you to take them.  You may have an exam or testing. This can include a tests to: ? Check for clots and check blood flow using sound waves (Doppler ultrasound). ? Observe how blood flows through your veins by injecting a dye that outlines your veins on X-rays (angiogram). This test is used in rare cases. What happens during the procedure? One of the following procedures will be performed: Sclerotherapy This procedure is  often used for small to medium veins.  A chemical (sclerosant) that irritates the lining of the vein will be injected into the vein. This will cause the varicose vein to be closed off. Sclerosants in different amounts and strengths can be used,  depending on the size and location of the vein.  All of the varicose vein sites will be injected. You may need more than one treatment because new varicose veins may develop, or more than one injection may be needed for each varicose vein.  Laser treatment There are two ways that lasers are used to treat varicose veins:  Light energy from a laser may be directed onto the vein through the skin.  A needle may be used to pass a thin laser catheter into the vein to cause it to close. You may need more than one treatment if the vein re-opens. In some cases, laser treatment may be combined with sclerotherapy. Radiofrequency vein ablation   You will be given a medicine that numbs the area (local anesthetic).  A small incision will be made near the varicose vein.  A thin tube (catheter) will be threaded into your vein.  The tip of the catheter will deploy electrodes.  The electrodes will deliver electrical energy to produce heat that closes off the vein. What happens after the procedure?  A bandage (dressing) may be used to cover the injection site or incisions.  You may have to wear compression stockings. These stockings help to prevent blood clots and reduce swelling in your legs.  Return to your normal activities as told by your health care provider. Summary  Varicose veins are swollen, twisted veins that are visible under the skin. They occur most often in the legs.  Various procedures can be used to treat varicose veins. You may need a procedure to treat your varicose veins if they are causing symptoms or complications, or if lifestyle changes have not helped.  Your health care provider will discuss the method that is best for you based on  your condition. This information is not intended to replace advice given to you by your health care provider. Make sure you discuss any questions you have with your health care provider. Document Revised: 08/23/2018 Document Reviewed: 08/11/2016 Elsevier Patient Education  [place].  Fungal Nail Infection A fungal nail infection is a common infection of the toenails or fingernails. This condition affects toenails more often than fingernails. It often affects the great, or big, toes. More than one nail may be infected. The condition can be passed from person to person (is contagious). What are the causes? This condition is caused by a fungus. Several types of fungi can cause the infection. These fungi are common in moist and warm areas. If your hands or feet come into contact with the fungus, it may get into a crack in your fingernail or toenail and cause the infection. What increases the risk? The following factors may make you more likely to develop this condition:  Being female.  Being of older age.  Living with someone who has the fungus.  Walking barefoot in areas where the fungus thrives, such as showers or locker rooms.  Wearing shoes and socks that cause your feet to sweat.  Having a nail injury or a recent nail surgery.  Having certain medical conditions, such as: ? Athlete's foot. ? Diabetes. ? Psoriasis. ? Poor circulation. ? A weak body defense system (immune system). What are the signs or symptoms? Symptoms of this condition include:  A pale spot on the nail.  Thickening of the nail.  A nail that becomes yellow or brown.  A brittle or ragged nail edge.  A crumbling nail.  A nail that has lifted away from the nail bed. How is  this diagnosed? This condition is diagnosed with a physical exam. Your health care provider may take a scraping or clipping from your nail to test for the fungus. How is this treated? Treatment is not needed for mild infections.  If you have significant nail changes, treatment may include:  Antifungal medicines taken by mouth (orally). You may need to take the medicine for several weeks or several months, and you may not see the results for a long time. These medicines can cause side effects. Ask your health care provider what problems to watch for.  Antifungal nail polish or nail cream. These may be used along with oral antifungal medicines.  Laser treatment of the nail.  Surgery to remove the nail. This may be needed for the most severe infections. It can take a long time, usually up to a year, for the infection to go away. The infection may also come back. Follow these instructions at home: Medicines  Take or apply over-the-counter and prescription medicines only as told by your health care provider.  Ask your health care provider about using over-the-counter mentholated ointment on your nails. Nail care  Trim your nails often.  Wash and dry your hands and feet every day.  Keep your feet dry: ? Wear absorbent socks, and change your socks frequently. ? Wear shoes that allow air to circulate, such as sandals or canvas tennis shoes. Throw out old shoes.  Do not use artificial nails.  If you go to a nail salon, make sure you choose one that uses clean instruments.  Use antifungal foot powder on your feet and in your shoes. General instructions  Do not share personal items, such as towels or nail clippers.  Do not walk barefoot in shower rooms or locker rooms.  Wear rubber gloves if you are working with your hands in wet areas.  Keep all follow-up visits as told by your health care provider. This is important. Contact a health care provider if: Your infection is not getting better or it is getting worse after several months. Summary  A fungal nail infection is a common infection of the toenails or fingernails.  Treatment is not needed for mild infections. If you have significant nail changes,  treatment may include taking medicine orally and applying medicine to your nails.  It can take a long time, usually up to a year, for the infection to go away. The infection may also come back.  Take or apply over-the-counter and prescription medicines only as told by your health care provider.  Follow instructions for taking care of your nails to help prevent infection from coming back or spreading. This information is not intended to replace advice given to you by your health care provider. Make sure you discuss any questions you have with your health care provider. Document Revised: 08/22/2018 Document Reviewed: 10/05/2017 Elsevier Patient Education  [place].

## 2019-07-24 NOTE — Progress Notes (Signed)
Subjective:    Patient ID: Deanna [name], female    DOB: April 12, 1961, 59 y.o.   MRN: [id number]  No chief complaint on file.   HPI Patient was seen today for ongoing concerns.  Pt endorses edema in feet and ankles. Endorses doing a lot of sitting for work.  Pt also notes varicose veins of b/l LEs.  Will wear compression socks.  Denies increased sodium intake.  States could drink more water. Pt states she woke up 2 wks ago feeling like the room was spinning.  Pt endorses ongoing brief episodes of the dizziness.  Also with deformities and discoloration on toes of L foot.  L great toe raised in center of nail which rubs against the top of her shoes.  Past Medical History:  Diagnosis Date  . Allergy   . GERD (gastroesophageal reflux disease)    "does not need meds anymore"  . Heart murmur   . Hyperlipidemia   . Lactose intolerance   . 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HCC)     Allergies  Allergen Reactions  . Eggs Or Egg-Derived Products Nausea And Vomiting  . Lactose Intolerance (Gi) Other (See Comments)    vomiting    ROS General: Denies fever, chills, night sweats, changes in weight, changes in appetite  +dizziness HEENT: Denies headaches, ear pain, changes in vision, rhinorrhea, sore throat CV: Denies CP, palpitations, SOB, orthopnea +varicose veins, LE edema Pulm: Denies SOB, cough, wheezing GI: Denies abdominal pain, nausea, vomiting, diarrhea, constipation GU: Denies dysuria, hematuria, frequency, vaginal discharge Msk: Denies muscle cramps, joint pains Neuro: Denies weakness, numbness, tingling Skin: Denies rashes, bruising  +toenail deformity/discoloration Psych: Denies depression, anxiety, hallucinations    Objective:    Blood pressure 110/70, pulse 68, temperature 97.7 F (36.5 C), temperature source Temporal, weight 228 lb (103.4 kg), SpO2 98 %.   Gen. Pleasant, well-nourished, in no distress, normal affect  HEENT: [place]/AT, face symmetric, conjunctiva  clear, no scleral icterus, PERRLA, EOMI with horizontal nystagmus,  nares patent without drainage, pharynx without erythema or exudate.  TMs normal b/l.  Dix-Hallpike and Epley Maneuvers performed. Neck: No JVD, no thyromegaly, no carotid bruits Lungs: no accessory muscle use, CTAB, no wheezes or rales Cardiovascular: RRR, no m/r/g, trace peripheral edema of R foot and ankle. Neuro:  A&Ox3, CN II-XII intact, normal gait Skin:  Warm, no lesions/ rash.  Varicose veins visible b/l.  Hyperpigmentation on R lateral foot and leg. Toenails of L foot with onychomycosis.   Wt Readings from Last 3 Encounters:  07/24/19 228 lb (103.4 kg)  04/30/19 218 lb (98.9 kg)  02/17/19 222 lb 6 oz (100.9 kg)    Lab Results  Component Value Date   WBC 6.8 12/27/2018   HGB 11.4 (L) 12/27/2018   HCT 34.9 (L) 12/27/2018   PLT 418.0 (H) 12/27/2018   GLUCOSE 78 12/27/2018   CHOL 136 01/17/2018   TRIG 94 08/16/2016   HDL 41.10 01/17/2018   LDLDIRECT 150.4 10/04/2012   LDLCALC 100 08/16/2016   ALT 14 12/27/2018   AST 15 12/27/2018   NA 141 12/27/2018   K 4.0 12/27/2018   CL 105 12/27/2018   CREATININE 0.75 12/27/2018   BUN 11 12/27/2018   CO2 30 12/27/2018   TSH 1.34 12/27/2018   HGBA1C 6.1 12/27/2018    Assessment/Plan:  Vertigo -Epley Maneuver performed.  Pt tolerated well.  -increase po hydration -meclizine prn -given handout - Plan: meclizine (ANTIVERT) 25 MG tablet  Varicose veins of both legs with edema -advised  PVD likely causing LE edema -Discussed compression\TED hose.  Patient will need lower extremities when sitting. -Discussed various treatment options -Given handout -Discussed referral to [facility].  Pt wishes to wait  Onychomycosis  -L foot -Discussed treatment options including p.o. medication -Given handout - Plan: Ambulatory referral to Podiatry  F/u prn  Grier Mitts, MD

## 2019-07-25 ENCOUNTER — Encounter: Payer: Self-pay | Admitting: Family Medicine

## 2019-07-25 DIAGNOSIS — B351 Tinea unguium: Secondary | ICD-10-CM | POA: Insufficient documentation

## 2019-07-25 DIAGNOSIS — I83893 Varicose veins of bilateral lower extremities with other complications: Secondary | ICD-10-CM | POA: Insufficient documentation

## 2019-08-04 ENCOUNTER — Other Ambulatory Visit: Payer: Self-pay | Admitting: Family Medicine

## 2019-08-04 DIAGNOSIS — Z1231 Encounter for screening mammogram for malignant neoplasm of breast: Secondary | ICD-10-CM

## 2019-08-07 ENCOUNTER — Other Ambulatory Visit: Payer: Self-pay

## 2019-08-07 ENCOUNTER — Ambulatory Visit (INDEPENDENT_AMBULATORY_CARE_PROVIDER_SITE_OTHER): Payer: 59 | Admitting: Podiatry

## 2019-08-07 ENCOUNTER — Encounter: Payer: Self-pay | Admitting: Podiatry

## 2019-08-07 VITALS — BP 137/79 | HR 62 | Temp 97.3°F | Resp 16

## 2019-08-07 DIAGNOSIS — L600 Ingrowing nail: Secondary | ICD-10-CM

## 2019-08-07 MED ORDER — NEOMYCIN-POLYMYXIN-HC 3.5-10000-1 OT SOLN: OTIC | 1 refills | Status: DC

## 2019-08-07 NOTE — Patient Instructions (Signed)

## 2019-08-07 NOTE — Progress Notes (Signed)
   Subjective:    Patient ID: Deanna [name], female    DOB: 12/07/1960, 59 y.o.   MRN: [id number]  HPI    Review of Systems  All other systems reviewed and are negative.      Objective:   Physical Exam        Assessment & Plan:

## 2019-08-08 NOTE — Progress Notes (Signed)
Subjective:   Patient ID: Deanna [name], female   DOB: 59 y.o.   MRN: [id number]   HPI Patient presents stating she has trouble with all her nails but her left big toenail has become more painful for her and making it more difficult to wear shoe gear comfortably.  Patient states that she is tried to trim it and soak it without relief and all nails have some discoloration of the left big toenail is her biggest problem.  Patient does not smoke likes to be active   Review of Systems  All other systems reviewed and are negative.       Objective:  Physical Exam Vitals and nursing note reviewed.  Constitutional:      Appearance: She is well-developed.  Pulmonary:     Effort: Pulmonary effort is normal.  Musculoskeletal:        General: Normal range of motion.  Skin:    General: Skin is warm.  Neurological:     Mental Status: She is alert.     Neurovascular status intact muscle strength found to be adequate with thick dystrophic deformed left hallux nail that is painful when pressed and making shoe gear difficult.  Patient states that she is tried wider shoes she is tried different treatments including soaks and trimming without relief other nails she can handle but this 1 has become more difficult for her to deal with.  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 I have recommended removal based on pain and inability to wear shoe gear and at this time I allowed patient to read and then sign consent form understanding risk. I infiltrated the left hallux with 60 mg. Of xylocaine, marcaine and then sterile prep and applied tourniquet. The left hallux nail was removed and the matrix was exposed. I applied 5 applications of phenol 30 seconds and then alcohol lavage. Sterile dressing applied and instructed on soaks and leaving  dressing on for 24 hours and take off early if throbbing were to occur. Instructed on surgical shoe usage and dispensed and drop usage. Encouraged to call with concerns and see back

## 2019-09-23 ENCOUNTER — Ambulatory Visit
Admission: RE | Admit: 2019-09-23 | Discharge: 2019-09-23 | Disposition: A | Discharge status: Home / Self Care | Payer: 59 | Source: Ambulatory Visit | Attending: Family Medicine | Admitting: Family Medicine

## 2019-09-23 ENCOUNTER — Other Ambulatory Visit: Payer: Self-pay

## 2019-09-23 DIAGNOSIS — Z1231 Encounter for screening mammogram for malignant neoplasm of breast: Secondary | ICD-10-CM

## 2019-10-27 ENCOUNTER — Encounter: Payer: Self-pay | Admitting: Family Medicine

## 2019-10-27 ENCOUNTER — Other Ambulatory Visit: Payer: Self-pay

## 2019-10-27 ENCOUNTER — Telehealth (INDEPENDENT_AMBULATORY_CARE_PROVIDER_SITE_OTHER): Payer: 59 | Admitting: Family Medicine

## 2019-10-27 VITALS — Ht 69.0 in | Wt 228.0 lb

## 2019-10-27 DIAGNOSIS — J069 Acute upper respiratory infection, unspecified: Secondary | ICD-10-CM | POA: Diagnosis not present

## 2019-10-27 MED ORDER — FLUTICASONE PROPIONATE 50 MCG/ACT NA SUSP: 1.0000 | Freq: Every day | NASAL | 6 refills | Status: DC

## 2019-10-27 NOTE — Progress Notes (Signed)
Virtual Visit via Video Note  I connected with Deanna [name] on 10/27/19 at  2:30 PM EDT by a video enabled telemedicine application 2/2 [id number] pandemic and verified that I am speaking with the correct person using two identifiers.  Location patient: home Location provider:work or home office Persons participating in the virtual visit: patient, provider  I discussed the limitations of evaluation and management by telemedicine and the availability of in person appointments. The patient expressed understanding and agreed to proceed.   HPI: Pt with HA, facial pain, L ear pressure, rhinorrhea, nasal congestion, dry cough that started over the wknd.  Pt denies fever, sore throat, n/v, diarrhea.  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  Sudafed helped relieve the pressure in her face.  Pt feeling stuffy today, though feeling better.  Notes green nasal drainage.  Pt has been using Afrin since Friday.  ROS: See pertinent positives and negatives per HPI.  Past Medical History:  Diagnosis Date  . Allergy   . GERD (gastroesophageal reflux disease)    "does not need meds anymore"  . Heart murmur   . Hyperlipidemia   . Lactose intolerance   . 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HCC)     Past Surgical History:  Procedure Laterality Date  . ABDOMINAL HYSTERECTOMY  2006   benign tumor, complete  . KNEE ARTHROSCOPY Right   . NASAL SEPTUM SURGERY    . 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 Arthritis Mother   . Hyperlipidemia Mother   . Stroke Mother   . Hypertension Mother   . Colon cancer Neg Hx      Current Outpatient Medications:  .  clopidogrel (PLAVIX) 75 MG tablet, TAKE 1 TABLET BY MOUTH EVERY DAY, Disp: 90 tablet, Rfl: 3 .  meclizine (ANTIVERT) 25 MG tablet, Take 1 tablet (25 mg total) by mouth 3 (three) times daily as needed for dizziness., Disp: 30 tablet, Rfl: 0 .  pravastatin (PRAVACHOL) 40 MG tablet, Take 1 tablet  (40 mg total) by mouth daily., Disp: 90 tablet, Rfl: 3 .  pregabalin (LYRICA) 50 MG capsule, Take 1 capsule every night, Disp: 90 capsule, Rfl: 3 .  neomycin-polymyxin-hydrocortisone (CORTISPORIN) OTIC solution, Apply 1-2 drops to toe after soaking BID, Disp: 10 mL, Rfl: 1  EXAM:  VITALS per patient if applicable: RR between 16-60 bmp  GENERAL: alert, oriented, appears well and in no acute distress  HEENT: atraumatic, conjunctiva clear, no obvious abnormalities on inspection of external nose and ears  NECK: normal movements of the head and neck  LUNGS: on inspection no signs of respiratory distress, breathing rate appears normal, no obvious gross SOB, gasping or wheezing  CV: no obvious cyanosis  MS: moves all visible extremities without noticeable abnormality  PSYCH/NEURO: pleasant and cooperative, no obvious depression or anxiety, speech and thought processing grossly intact  ASSESSMENT AND PLAN:  Discussed the following assessment and plan:  Viral URI  -discussed possible causes including acute nasopharyngitis, allergies, sinusitis, cannot r/o COVID-19 -continue supportive care as symptoms seem to be improving -advised to d/c Afrin to avoid dependence -can start Flonase or saline nasal rinse for symptoms -for continued or worsened symptoms pt to notify clinic on Thursday. At that time if pt with increased facial pressure, HAs, ear pressure, etc will send in rx for amoxicillin or augmenting for acute sinusitis. - Plan: fluticasone (FLONASE) 50 MCG/ACT nasal spray  F/u prn   I discussed the  assessment and treatment plan with the patient. The patient was provided an opportunity to ask questions and all were answered. The patient agreed with the plan and demonstrated an understanding of the instructions.   The patient was advised to call back or seek an in-person evaluation if the symptoms worsen or if the condition fails to improve as anticipated.  I provided 8 minutes of  non-face-to-face time during this encounter.   Billie Ruddy, MD

## 2019-10-27 NOTE — Patient Instructions (Signed)
Health Maintenance Due  Topic Date Due  . COVID-19 Vaccine (1) Never done  . PAP SMEAR-Modifier  12/02/2018  . TETANUS/TDAP  10/14/2019    Depression screen PHQ 2/9 01/17/2018  Decreased Interest 0  Down, Depressed, Hopeless 0  PHQ - 2 Score 0

## 2019-11-03 ENCOUNTER — Telehealth: Payer: Self-pay | Admitting: Family Medicine

## 2019-11-03 NOTE — Telephone Encounter (Signed)
Pt is calling in stating the her symptoms is not any better.   Pharm:  [facility] on [address]

## 2019-11-05 ENCOUNTER — Telehealth: Payer: Self-pay

## 2019-11-05 ENCOUNTER — Other Ambulatory Visit: Payer: Self-pay | Admitting: Family Medicine

## 2019-11-05 DIAGNOSIS — J329 Chronic sinusitis, unspecified: Secondary | ICD-10-CM

## 2019-11-05 MED ORDER — AMOXICILLIN-POT CLAVULANATE 500-125 MG PO TABS: 1.0000 | ORAL_TABLET | Freq: Two times a day (BID) | ORAL | 0 refills | Status: AC

## 2019-11-05 NOTE — Telephone Encounter (Signed)
Rx for Augmentin sent to pharmacy for sinusitis.

## 2019-11-05 NOTE — Telephone Encounter (Signed)
Message sent to Dr Banks for review 

## 2019-11-06 NOTE — Telephone Encounter (Signed)
Spoke with the pt and she stated she picked up the Rx last night.

## 2019-11-06 NOTE — Telephone Encounter (Signed)
See prior note

## 2019-11-26 ENCOUNTER — Telehealth: Payer: Self-pay | Admitting: Neurology

## 2019-11-26 NOTE — Telephone Encounter (Signed)
Patient called in with some new symptoms. The patient stated her left arm has been feeling numb and heavy like she cannot lift it, that numbness has moved toward her other right arm today. She had trouble lifting and getting a grip on some juice boxes with her right arm. She has also experienced some blurry and double vision.

## 2019-11-26 NOTE — Telephone Encounter (Signed)
Pt called no answer voice mail left if pt calls back she needs to be advised to go to the ER

## 2019-11-27 ENCOUNTER — Emergency Department (HOSPITAL_COMMUNITY)
Admission: EM | Admit: 2019-11-27 | Discharge: 2019-11-27 | Disposition: A | Discharge status: Home / Self Care | Payer: 59 | Attending: Emergency Medicine | Admitting: Emergency Medicine

## 2019-11-27 ENCOUNTER — Encounter (HOSPITAL_COMMUNITY): Payer: Self-pay | Admitting: Obstetrics and Gynecology

## 2019-11-27 ENCOUNTER — Emergency Department (HOSPITAL_BASED_OUTPATIENT_CLINIC_OR_DEPARTMENT_OTHER)
Admission: EM | Admit: 2019-11-27 | Discharge: 2019-11-27 | Disposition: A | Discharge status: Home / Self Care | Payer: 59 | Attending: Emergency Medicine | Admitting: Emergency Medicine

## 2019-11-27 ENCOUNTER — Other Ambulatory Visit: Payer: Self-pay

## 2019-11-27 ENCOUNTER — Emergency Department (HOSPITAL_BASED_OUTPATIENT_CLINIC_OR_DEPARTMENT_OTHER): Payer: 59

## 2019-11-27 ENCOUNTER — Encounter (HOSPITAL_BASED_OUTPATIENT_CLINIC_OR_DEPARTMENT_OTHER): Payer: Self-pay

## 2019-11-27 DIAGNOSIS — H538 Other visual disturbances: Secondary | ICD-10-CM | POA: Diagnosis not present

## 2019-11-27 DIAGNOSIS — R202 Paresthesia of skin: Secondary | ICD-10-CM | POA: Diagnosis not present

## 2019-11-27 DIAGNOSIS — R200 Anesthesia of skin: Secondary | ICD-10-CM | POA: Insufficient documentation

## 2019-11-27 DIAGNOSIS — Z5321 Procedure and treatment not carried out due to patient leaving prior to being seen by health care provider: Secondary | ICD-10-CM | POA: Insufficient documentation

## 2019-11-27 LAB — CBC WITH DIFFERENTIAL/PLATELET
Abs Immature Granulocytes: 0.02 10*3/uL (ref 0.00–0.07)
Basophils Absolute: 0.1 10*3/uL (ref 0.0–0.1)
Basophils Relative: 1 %
Eosinophils Absolute: 0.2 10*3/uL (ref 0.0–0.5)
Eosinophils Relative: 2 %
HCT: 39 % (ref 36.0–46.0)
Hemoglobin: 12.4 g/dL (ref 12.0–15.0)
Immature Granulocytes: 0 %
Lymphocytes Relative: 36 %
Lymphs Abs: 2.6 10*3/uL (ref 0.7–4.0)
MCH: 28.8 pg (ref 26.0–34.0)
MCHC: 31.8 g/dL (ref 30.0–36.0)
MCV: 90.7 fL (ref 80.0–100.0)
Monocytes Absolute: 0.8 10*3/uL (ref 0.1–1.0)
Monocytes Relative: 10 %
Neutro Abs: 3.6 10*3/uL (ref 1.7–7.7)
Neutrophils Relative %: 51 %
Platelets: 436 10*3/uL — ABNORMAL HIGH (ref 150–400)
RBC: 4.3 MIL/uL (ref 3.87–5.11)
RDW: 14.2 % (ref 11.5–15.5)
WBC: 7.2 10*3/uL (ref 4.0–10.5)
nRBC: 0 % (ref 0.0–0.2)

## 2019-11-27 LAB — BASIC METABOLIC PANEL
Anion gap: 10 (ref 5–15)
BUN: 10 mg/dL (ref 6–20)
CO2: 28 mmol/L (ref 22–32)
Calcium: 9.1 mg/dL (ref 8.9–10.3)
Chloride: 102 mmol/L (ref 98–111)
Creatinine, Ser: 0.79 mg/dL (ref 0.44–1.00)
GFR calc Af Amer: >60 mL/min (ref >60)
GFR calc non Af Amer: >60 mL/min (ref >60)
Glucose, Bld: 90 mg/dL (ref 70–99)
Potassium: 3.4 mmol/L — ABNORMAL LOW (ref 3.5–5.1)
Sodium: 140 mmol/L (ref 135–145)

## 2019-11-27 LAB — APTT: aPTT: 34 seconds (ref 24–36)

## 2019-11-27 LAB — PROTIME-INR
INR: 1.1 (ref 0.8–1.2)
Prothrombin Time: 13.5 seconds (ref 11.4–15.2)

## 2019-11-27 NOTE — ED Provider Notes (Signed)
[facility] EMERGENCY DEPARTMENT Provider Note   CSN: [id number] Arrival date & time: 11/27/19  1205     History Chief Complaint  Patient presents with  . Numbness    Deanna [name] is a 59 y.o. female with a past medical history of cervical radiculopathy, prior stroke currently on Plavix, hyperlipidemia, vertigo presenting to the ED with a chief complaint of left arm numbness.  2 days ago was walking in a store and developed "heaviness" in her left arm.  Reports associated paresthesias and feeling like "my arm fell asleep."  She reports intermittent weakness as well when attempting to grip an object.  No specific trigger for any of the symptoms.  She continues to have the symptoms today.  Also reports double vision in both of her eyes which she states is not unusual for her.  States that she has had this previously when she has vertigo.  No changes from baseline regarding this.  Denies any headache, neck pain or stiffness, fever, injuries or falls, numbness in legs, changes to gait or prior neck surgeries.  HPI     Past Medical History:  Diagnosis Date  . Allergy   . GERD (gastroesophageal reflux disease)    "does not need meds anymore"  . Heart murmur   . Hyperlipidemia   . Lactose intolerance   . TTP (thrombotic thrombocytopenic purpura) [facility])     Patient Active Problem List   Diagnosis Date Noted  . Onychomycosis 07/25/2019  . Varicose veins of both legs with edema 07/25/2019  . H. pylori infection 12/21/2016  . Anemia 12/12/2016  . Abnormal brain MRI 09/25/2016  . Numbness and tingling in left hand 09/25/2016  . Cervical radiculopathy at C5 09/25/2016  . Neck pain 09/25/2016  . 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 Aortic root dilatation ([place]) 08/07/2016  . Ear pain 07/22/2016  . Hyperlipemia 12/02/2015  . H/O splenectomy 12/02/2015  . Hyperglycemia 01/27/2014  . Obesity 01/27/2014  . Unspecified vitamin D deficiency 09/11/2012    Past Surgical  History:  Procedure Laterality Date  . ABDOMINAL HYSTERECTOMY  2006   benign tumor, complete  . KNEE ARTHROSCOPY Right   . NASAL SEPTUM SURGERY    . 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     OB History   No obstetric history on file.     Family History  Problem Relation Age of Onset  . Arthritis Mother   . Hyperlipidemia Mother   . Stroke Mother   . Hypertension Mother   . Colon cancer Neg Hx     Social History   Tobacco Use  . Smoking status: Never Smoker  . Smokeless tobacco: Never Used  Vaping Use  . Vaping Use: Never used  Substance Use Topics  . Alcohol use: No  . Drug use: No    Home Medications Prior to Admission medications   Medication Sig Start Date End Date Taking? Authorizing Provider  clopidogrel (PLAVIX) 75 MG tablet TAKE 1 TABLET BY MOUTH EVERY DAY 10/17/18   Cameron Sprang, MD  fluticasone [facility]) 50 MCG/ACT nasal spray Place 1 spray into both nostrils daily. 10/27/19   Billie Ruddy, MD  meclizine (ANTIVERT) 25 MG tablet Take 1 tablet (25 mg total) by mouth 3 (three) times daily as needed for dizziness. 07/24/19   Billie Ruddy, MD  neomycin-polymyxin-hydrocortisone (CORTISPORIN) OTIC solution Apply 1-2 drops to toe after soaking BID 08/07/19   Wallene Huh, DPM  pravastatin (PRAVACHOL) 40 MG tablet  Take 1 tablet (40 mg total) by mouth daily. 01/17/18   Lucretia Kern, DO  pregabalin (LYRICA) 50 MG capsule Take 1 capsule every night 04/30/19   Cameron Sprang, MD    Allergies    Eggs or egg-derived products and Lactose intolerance (gi)  Review of Systems   Review of Systems  Constitutional: Negative for appetite change, chills and fever.  HENT: Negative for ear pain, rhinorrhea, sneezing and sore throat.   Eyes: Positive for visual disturbance. Negative for photophobia.  Respiratory: Negative for cough, chest tightness, shortness of breath and wheezing.   Cardiovascular: Negative for chest pain and palpitations.  Gastrointestinal:  Negative for abdominal pain, blood in stool, constipation, diarrhea, nausea and vomiting.  Genitourinary: Negative for dysuria, hematuria and urgency.  Musculoskeletal: Negative for myalgias.  Skin: Negative for rash.  Neurological: Positive for numbness. Negative for dizziness, weakness and light-headedness.    Physical Exam Updated Vital Signs BP 135/76   Pulse (!) 58   Temp 98.4 F (36.9 C) (Oral)   Resp 13   Ht 5\' 9"  (1.753 m)   Wt 99.5 kg   SpO2 100%   BMI 32.38 kg/m   Physical Exam Vitals and nursing note reviewed.  Constitutional:      General: She is not in acute distress.    Appearance: She is well-developed.  HENT:     Head: Normocephalic and atraumatic.     Nose: Nose normal.  Eyes:     General: No scleral icterus.       Right eye: No discharge.        Left eye: No discharge.     Conjunctiva/sclera: Conjunctivae normal.     Pupils: Pupils are equal, round, and reactive to light.  Neck:     Comments: No neck stiffness noted. Cardiovascular:     Rate and Rhythm: Normal rate and regular rhythm.     Heart sounds: Normal heart sounds. No murmur heard.  No friction rub. No gallop.   Pulmonary:     Effort: Pulmonary effort is normal. No respiratory distress.     Breath sounds: Normal breath sounds.  Abdominal:     General: Bowel sounds are normal. There is no distension.     Palpations: Abdomen is soft.     Tenderness: There is no abdominal tenderness. There is no guarding.  Musculoskeletal:        General: Normal range of motion.     Cervical back: Normal range of motion and neck supple. No rigidity or tenderness.  Skin:    General: Skin is warm and dry.     Findings: No rash.  Neurological:     General: No focal deficit present.     Mental Status: She is alert and oriented to person, place, and time.     Cranial Nerves: No cranial nerve deficit.     Sensory: No sensory deficit.     Motor: No weakness or abnormal muscle tone.     Coordination:  Coordination normal.     Comments: Pupils reactive. No facial asymmetry noted. Cranial nerves appear grossly intact. Sensation intact to light touch on face, BUE and BLE. Strength 5/5 in BLE. Strength 5/5 in BUE. Normal finger to nose coordination bilaterally.     ED Results / Procedures / Treatments   Labs (all labs ordered are listed, but only abnormal results are displayed) Labs Reviewed  BASIC METABOLIC PANEL - Abnormal; Notable for the following components:      Result Value  Potassium 3.4 (*)    All other components within normal limits  CBC WITH DIFFERENTIAL/PLATELET - Abnormal; Notable for the following components:   Platelets 436 (*)    All other components within normal limits  PROTIME-INR  APTT    EKG None  Radiology CT Head Wo Contrast  Result Date: 11/27/2019 CLINICAL DATA:  Left arm numbness since Tuesday. EXAM: CT HEAD WITHOUT CONTRAST TECHNIQUE: Contiguous axial images were obtained from the base of the skull through the vertex without intravenous contrast. COMPARISON:  Brain MR 01/22/2017. FINDINGS: Brain: Mild cerebral and cerebellar atrophy for age. No mass lesion, hemorrhage, hydrocephalus, acute infarct, intra-axial, or extra-axial fluid collection. Vascular: No hyperdense vessel or unexpected calcification. Skull: Normal Sinuses/Orbits: Normal imaged portions of the orbits and globes. Clear paranasal sinuses and mastoid air cells. Other: None. IMPRESSION: 1. No acute intracranial abnormality. 2. Cerebral and cerebellar atrophy. Electronically Signed   By: Abigail Miyamoto M.D.   On: 11/27/2019 14:10    Procedures Procedures (including critical care time)  Medications Ordered in ED Medications - No data to display  ED Course  I have reviewed the triage vital signs and the nursing notes.  Pertinent labs & imaging results that were available during my care of the patient were reviewed by me and considered in my medical decision making (see chart for details).     MDM Rules/Calculators/A&P                          59 year old female with a past medical history of cervical radiculopathy, prior stroke currently on Plavix, hyperlipidemia and vertigo presenting to the ED with a chief complaint of left arm numbness and weakness.  2 days ago developed a heavy feeling in her arm and feeling like she was having paresthesias.  Reports intermittent weakness as well when attempting to grip an object.  No injuries or falls or specific trigger, denies any headache.  Reports double vision in both of her eyes which is not unusual for her and states that this is usually related to her vertigo.  On exam patient is overall well-appearing.  No neurological deficits noted, no changes to sensation, no weakness noted.  No facial asymmetry.  She remains ambulatory with normal gait.  No neck pain, stiffness or meningeal signs noted.  She reports compliance with her home Plavix and other medications including her statin.  Lab work here is unremarkable including CBC, BMP, PT/INR, PTT.  CT of the head without any acute findings.  I had a discussion with the patient regarding transfer to [facility] or [facility] for MRI of her head and possibly her cervical spine today.  Told her that today's work-up was overall reassuring.  I did review her neurology notes over the past 3 years.  She had similar symptoms when she presented to her PCPs office in 2018 and was referred to neurology.  Her MRI of her cervical spine did show nerve impingement which caused her radicular symptoms at the time.  The MRI of her brain showed possible chronic infarcts versus demyelinating disease.  She was started on Plavix although did not meet criteria to be diagnosed with MS.  Her repeat MRI several months after her initial 1 in 2018 showed the same findings.  She did have a stroke work-up done which was unremarkable.  After discussing this with the patient, she feels that she would rather follow-up with her neurologist.  She  has a follow-up appointment in 2 weeks  but encouraged her to call her sooner to see if she can get her in earlier. Discussed risks vs benefits of following up outpatient versus being transferred today and she elects to be discharged home with outpatient follow-up.  I will have her return for any worsening symptoms and continue her home medications.   Patient is hemodynamically stable, in NAD, and able to ambulate in the ED. Evaluation does not show pathology that would require ongoing emergent intervention or inpatient treatment. I explained the diagnosis to the patient. Pain has been managed and has no complaints prior to discharge. Patient is comfortable with above plan and is stable for discharge at this time. All questions were answered prior to disposition. Strict return precautions for returning to the ED were discussed. Encouraged follow up with PCP.   An After Visit Summary was printed and given to the patient.   Portions of this note were generated with [employer]. Dictation errors may occur despite best attempts at proofreading.  Final Clinical Impression(s) / ED Diagnoses Final diagnoses:  Paresthesias    Rx / DC Orders ED Discharge Orders    None       Delia Heady, PA-C 11/27/19 1445    Truddie Hidden, MD 11/27/19 1451

## 2019-11-27 NOTE — ED Notes (Signed)
ED Provider at bedside. 

## 2019-11-27 NOTE — Discharge Instructions (Addendum)
follow-up with your neurologist as soon as possible. Continue taking your home medications as prescribed. Return to the ER for worsening paresthesias, numbness, headache, blurry vision, injuries or falls.

## 2019-11-27 NOTE — ED Triage Notes (Signed)
Patient reports to ED for complaint of left arm numbness. Patient's neuro exam unremarkable.Patient reports no sensation difference when touching both arms but reports she had an episode of double vision last night.  Patient reports she has had no gait problems, swallowing difficulties, or other difficulties.

## 2019-11-27 NOTE — ED Triage Notes (Addendum)
Pt c/o numbness to left UE, double vision-sx started 7/13-denies pain-states she had difficulty gripping a box of juice with left hand yesterday-NAD-steady gait-[facility] ED due to wait time per pt

## 2019-11-27 NOTE — Telephone Encounter (Signed)
Called pt this morning, she did not go to the hospital yesterday, pt is still having numbness stated that feels like her hands are asleep. Pt advised that she needs to go to the hospital to be evaluated. Pt verbalized understanding. Pt informed that I would let Dr Delice Lesch know that I talked to her and advised her to go get evaluated with her still having symptoms,

## 2019-11-27 NOTE — ED Notes (Signed)
Per registration, patient turned in labels and reports she is leaving.

## 2019-11-28 ENCOUNTER — Telehealth: Payer: Self-pay

## 2019-11-28 NOTE — Telephone Encounter (Signed)
Pt called and informed that Dr Delice Lesch got the notes from the ER we are going to wait on the MRI and see until after her visit this month, pt verbalized understanding,

## 2019-12-09 ENCOUNTER — Other Ambulatory Visit: Payer: Self-pay

## 2019-12-09 ENCOUNTER — Encounter: Payer: Self-pay | Admitting: Neurology

## 2019-12-09 ENCOUNTER — Ambulatory Visit (INDEPENDENT_AMBULATORY_CARE_PROVIDER_SITE_OTHER): Payer: 59 | Admitting: Neurology

## 2019-12-09 VITALS — BP 133/85 | HR 68 | Ht 69.0 in | Wt 219.0 lb

## 2019-12-09 DIAGNOSIS — R202 Paresthesia of skin: Secondary | ICD-10-CM | POA: Diagnosis not present

## 2019-12-09 DIAGNOSIS — R200 Anesthesia of skin: Secondary | ICD-10-CM | POA: Diagnosis not present

## 2019-12-09 DIAGNOSIS — M5412 Radiculopathy, cervical region: Secondary | ICD-10-CM

## 2019-12-09 MED ORDER — CLOPIDOGREL BISULFATE 75 MG PO TABS: 75.0000 mg | ORAL_TABLET | Freq: Every day | ORAL | 3 refills | Status: DC

## 2019-12-09 MED ORDER — PREGABALIN 100 MG PO CAPS: ORAL_CAPSULE | ORAL | 3 refills | Status: DC

## 2019-12-09 NOTE — Addendum Note (Signed)
Addended by: Armen Pickup A on: 12/09/2019 10:59 AM   Modules accepted: Orders

## 2019-12-09 NOTE — Progress Notes (Signed)
NEUROLOGY FOLLOW UP OFFICE NOTE  Deanna [name] [id number] Mar 07, 1961  HISTORY OF PRESENT ILLNESS: I had the pleasure of seeing Deanna [name] in follow-up in the neurology clinic on 12/09/2019.  The patient was last seen 7 months ago for cervical radiculopathy on the left. She initially reported left arm burning/pain, EMG/NCV of the left arm and leg were normal. 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 She is on Pregabalin 50mg  qhs with no side effects. She previously did PT which helped with her symptoms, however called our office 2 weeks ago to report left arm numbness and heaviness, also affecting her right arm. She went to the ER on 7/15 with unremarkable head CT, no acute changes, there was cerebral and cerebellar atrophy. She reports the symptoms are very similar to before with intermittent numbness in her left arm, like it is asleep. It feels a little heavy. Symptoms are intermittent, last week was good but symptoms are back this week. There is no neck pain. Face, right arm, and legs are unaffected. Symptoms typically don't bother her in the morning, probably more at the end of the day. 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 She continues on Plavix for secondary stroke prevention, BP today 133/85. No falls.    History on Initial Assessment 07/20/2016: This is a pleasant 58 yo RH woman with a history of hyperlipidemia who reported a 3-year history of intermittent episodes of dizziness described as spinning, as well as feeling lightheaded with blurred vision lasting 20-30 seconds. Recently she started having different symptoms, around 2 weeks ago she started getting carsick, which  is new for her. A week ago, her left hand was numb, "like it was not there," then 20-30 seconds later it started tingling. Face and leg were unaffected. She did not have any neck pain at that time, but 3 days later started having a headache with pressure over the frontal regions that she ascribed to her sinuses. She started having pretty significant intermittent nausea. She continued to have dizziness, last episode was a week ago. The headaches have resolved. Blurred vision still comes and goes. She denies any diplopia, dysarthria/dysphagia, back pain, bowel/bladder dysfunction, negative Lhermitte sign. She denies any prior history of focal symptoms, no prior history of vision loss. She denies any head injuries. 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 There is a family history of stroke.  She had an MRI brain with and without contrast last 07/18/2016 which I personally reviewed, there was a faint punctate focus of reduced diffusion in the right frontal lobe deep white matter, difficult to see on the ADC map due to small size, this can be seen with acute ischemia or hyperacute demyelination. There were at least 5 additional subcentimeter white matter, right parietal, medial left thalamus, and left occipital horn FLAIR hyperintensities. There was mild prominence of the ventricles and sulci for age. No abnormal enhancement seen. 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    PAST MEDICAL HISTORY: Past Medical History:  Diagnosis Date  . Allergy   . GERD (gastroesophageal reflux disease)    "does not need meds anymore"  . Heart  murmur   . Hyperlipidemia   . Lactose intolerance   . 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HCC)     MEDICATIONS: Current Outpatient Medications on File Prior to Visit  Medication Sig Dispense Refill  . clopidogrel (PLAVIX) 75 MG tablet TAKE 1 TABLET BY MOUTH EVERY DAY 90 tablet 3  .  pravastatin (PRAVACHOL) 40 MG tablet Take 1 tablet (40 mg total) by mouth daily. 90 tablet 3  . pregabalin (LYRICA) 50 MG capsule Take 1 capsule every night 90 capsule 3  . fluticasone (FLONASE) 50 MCG/ACT nasal spray Place 1 spray into both nostrils daily. (Patient not taking: Reported on 12/09/2019) 16 g 6  . meclizine (ANTIVERT) 25 MG tablet Take 1 tablet (25 mg total) by mouth 3 (three) times daily as needed for dizziness. (Patient not taking: Reported on 12/09/2019) 30 tablet 0  . neomycin-polymyxin-hydrocortisone (CORTISPORIN) OTIC solution Apply 1-2 drops to toe after soaking BID 10 mL 1   No current facility-administered medications on file prior to visit.    ALLERGIES: Allergies  Allergen Reactions  . Eggs Or Egg-Derived Products Nausea And Vomiting  . Lactose Intolerance (Gi) Other (See Comments)    vomiting    FAMILY HISTORY: Family History  Problem Relation Age of Onset  . Arthritis Mother   . Hyperlipidemia Mother   . Stroke Mother   . Hypertension Mother   . Colon cancer Neg Hx     SOCIAL HISTORY: Social History   Socioeconomic History  . Marital status: Married    Spouse name: Not on file  . Number of children: Not on file  . Years of education: Not on file  . Highest education level: Not on file  Occupational History  . Not on file  Tobacco Use  . Smoking status: Never Smoker  . Smokeless tobacco: Never Used  Vaping Use  . Vaping Use: Never used  Substance and Sexual Activity  . Alcohol use: No  . Drug use: No  . Sexual activity: Not on file  Other Topics Concern  . 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 Difficulty of  Paying Living Expenses:   Food Insecurity:   . Worried About Charity fundraiser in the Last Year:   . Arboriculturist in the Last Year:   Transportation Needs:   . Film/video editor (Medical):   Marland Kitchen Lack of Transportation (Non-Medical):   Physical Activity:   . Days of Exercise per Week:   . Minutes of Exercise per Session:   Stress:   . Feeling of Stress :   Social Connections:   . Frequency of Communication with Friends and Family:   . Frequency of Social Gatherings with Friends and Family:   . Attends Religious Services:   . Active Member of Clubs or Organizations:   . Attends Archivist Meetings:   Marland Kitchen Marital Status:   Intimate Partner Violence:   . Fear of Current or Ex-Partner:   . Emotionally Abused:   Marland Kitchen Physically Abused:   .  Sexually Abused:     PHYSICAL EXAM: Vitals:   12/09/19 0948  BP: (!) 133/85  Pulse: 68  SpO2: 97%   General: No acute distress, tired-appearing Head:  Normocephalic/atraumatic Skin/Extremities: No rash, no edema Neurological Exam: alert and oriented to person, place, and time. No aphasia or dysarthria. Fund of knowledge is appropriate.  Recent and remote memory are intact.  Attention and concentration are normal.     Cranial nerves: Pupils equal, round, reactive to light.   Extraocular movements intact with no nystagmus. Visual fields full. Facial sensation intact. No facial asymmetry. Motor: Bulk and tone normal, muscle strength 5/5 throughout with no pronator drift.  Sensation to light touch, temperature, pin, and vibration intact. Deep tendon reflexes +1 throughout. Finger to nose testing intact.  Gait narrow-based and steady, able to tandem walk adequately.  Romberg negative.  IMPRESSION: This is a pleasant 59 yo RH woman with a history of hyperlipidemia, who presented initially with a 3-year history of intermittent dizziness suggestive of vertigo that had worsened with nausea, headaches, and a transient episode of left hand  numbness and tingling. There have been no further episodes of dizziness or headaches. MRI brain in 2018 had shown a faint punctate focus of reduced diffusion in the right frontal lobe deep white matter, difficult to see on the ADC map due to small size, this can be seen with acute ischemia or hyperacute demyelination. Repeat MRI did not show any progression or new changes. She is on Plavix for secondary stroke prevention, continue control of vascular risk factors. She has been having left arm numbness since 2018, EMG/NCV normal, MRI cervical spine in 2018 showed left C5 radiculopathy. She has been doing more lifting recently which is likely the cause of recurrence of left arm symptoms, neurological exam today normal. We discussed doing another course of physical therapy as it helped her in the past, and increasing Pregabalin to 100mg  qhs. She was advised to have increased assistance in caregiving duties for her mother. Follow-up in 6 months, call for any changes.   Thank you for allowing me to participate in her care.  Please do not hesitate to call for any questions or concerns.   Ellouise Newer, M.D.   CC: Dr. Volanda Napoleon

## 2019-12-09 NOTE — Patient Instructions (Signed)
1. Increase Lyrica to 100mg  every night  2. A referral will be sent for Physical Therapy for the left arm  3. Continue Plavix 75mg  daily  4. Follow-up in 6 months, call for any changes

## 2019-12-30 ENCOUNTER — Telehealth (INDEPENDENT_AMBULATORY_CARE_PROVIDER_SITE_OTHER): Payer: 59 | Admitting: Family Medicine

## 2019-12-30 ENCOUNTER — Other Ambulatory Visit: Payer: Self-pay

## 2019-12-30 ENCOUNTER — Ambulatory Visit (HOSPITAL_COMMUNITY)
Admission: EM | Admit: 2019-12-30 | Discharge: 2019-12-30 | Disposition: A | Discharge status: Home / Self Care | Payer: 59 | Attending: Family Medicine | Admitting: Family Medicine

## 2019-12-30 DIAGNOSIS — Z1152 Encounter for screening for COVID-19: Secondary | ICD-10-CM | POA: Diagnosis not present

## 2019-12-30 DIAGNOSIS — R0981 Nasal congestion: Secondary | ICD-10-CM

## 2019-12-30 DIAGNOSIS — J309 Allergic rhinitis, unspecified: Secondary | ICD-10-CM | POA: Diagnosis not present

## 2019-12-30 DIAGNOSIS — Z20822 Contact with and (suspected) exposure to covid-19: Secondary | ICD-10-CM | POA: Diagnosis present

## 2019-12-30 DIAGNOSIS — R519 Headache, unspecified: Secondary | ICD-10-CM

## 2019-12-30 MED ORDER — AMOXICILLIN-POT CLAVULANATE 875-125 MG PO TABS: 1.0000 | ORAL_TABLET | Freq: Two times a day (BID) | ORAL | 0 refills | Status: DC

## 2019-12-30 NOTE — ED Triage Notes (Signed)
Pt states she was evaluated by her PCP this morning for congestion, runny nose and given Rx for antibiotic and advised to come here for covid testing. Pt has had URI symptoms as mentioned above for approx 2 days.  Denies fever, chills,n/v/d, cough, SOB, abdominal pain. Per Dr. Meda Coffee, COVID testing performed and pt discharged home.

## 2019-12-30 NOTE — Patient Instructions (Addendum)
°-  I sent the medication(s) we discussed to your pharmacy: Meds ordered this encounter  Medications   amoxicillin-clavulanate (AUGMENTIN) 875-125 MG tablet    Sig: Take 1 tablet by mouth 2 (two) times daily.    Dispense:  20 tablet    Refill:  0   Please let us know if you have any questions or concerns regarding this prescription.  Start with nasal saline and a short 3 days course of Afrin nasal spray. If not improving  the Augmentin is for sinus infection to start.   For underlying chronic allergies a daily flonase and or allegra could help.  Get COVID19 testing. If positive follow up with your doctor and the [place] outpatient treatment center number in case needed is: [phone].   I hope you are feeling better soon! Seek care promptly if your symptoms worsen, new concerns arise or you are not improving with treatment.     WORK SLIP:  Patient Deanna [name],  [birth year], was seen for a medical visit today, 12/30/2019. Please excuse from work according to the [facility] guidelines for a COVID like illness. We advise 10 days minimum from the onset of symptoms (12/28/2019) PLUS 1 day of no fever and improved symptoms. Will defer to employer for a sooner return to work if Deanna [name] testing is negative and the symptoms have resolved. Advise following CDC guidelines.  She/He may work remotely from home in self isolation if she is feeling better and wishes to do so.  Sincerely: E-signature: Dr. Colin Benton, DO [place] Ph: [phone]

## 2019-12-30 NOTE — Progress Notes (Signed)
Virtual Visit via Video Note  I connected with Okie  on 12/30/19 at 12:20 PM EDT by a video enabled telemedicine application and verified that I am speaking with the correct person using two identifiers.  Location patient: home, [place] Location provider:work or home office Persons participating in the virtual visit: patient, provider  I discussed the limitations of evaluation and management by telemedicine and the availability of in person appointments. The patient expressed understanding and agreed to proceed.   HPI:  Acute visit for sinus issues: -started 2 days ago -symptoms include sinus congestion, sinus pressure and max pain/discomfort, HA, sinus drainage - green -denies fevers, Cough, SOB, malaise, NVD, body aches, loss of taste or smell -she thought this was her allergies at first as has chronic congestion with her allergies and more so the last few weeks -3 cases of RSV in her class at work right now -fully vaccinated for COVID19 with Moderna -denies any known covid exposures  ROS: See pertinent positives and negatives per HPI.  Past Medical History:  Diagnosis Date  . Allergy   . GERD (gastroesophageal reflux disease)    "does not need meds anymore"  . Heart murmur   . Hyperlipidemia   . Lactose intolerance   . 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HCC)     Past Surgical History:  Procedure Laterality Date  . ABDOMINAL HYSTERECTOMY  2006   benign tumor, complete  . KNEE ARTHROSCOPY Right   . NASAL SEPTUM SURGERY    . 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 Arthritis Mother   . Hyperlipidemia Mother   . Stroke Mother   . Hypertension Mother   . Colon cancer Neg Hx     SOCIAL HX: see hpi   Current Outpatient Medications:  .  amoxicillin-clavulanate (AUGMENTIN) 875-125 MG tablet, Take 1 tablet by mouth 2 (two) times daily., Disp: 20 tablet, Rfl: 0 .  clopidogrel (PLAVIX) 75 MG tablet, Take 1  tablet (75 mg total) by mouth daily., Disp: 90 tablet, Rfl: 3 .  fluticasone (FLONASE) 50 MCG/ACT nasal spray, Place 1 spray into both nostrils daily. (Patient not taking: Reported on 12/09/2019), Disp: 16 g, Rfl: 6 .  meclizine (ANTIVERT) 25 MG tablet, Take 1 tablet (25 mg total) by mouth 3 (three) times daily as needed for dizziness. (Patient not taking: Reported on 12/09/2019), Disp: 30 tablet, Rfl: 0 .  neomycin-polymyxin-hydrocortisone (CORTISPORIN) OTIC solution, Apply 1-2 drops to toe after soaking BID, Disp: 10 mL, Rfl: 1 .  pravastatin (PRAVACHOL) 40 MG tablet, Take 1 tablet (40 mg total) by mouth daily., Disp: 90 tablet, Rfl: 3 .  pregabalin (LYRICA) 100 MG capsule, Take 1 capsule every night, Disp: 90 capsule, Rfl: 3  EXAM:  VITALS per patient if applicable:  GENERAL: alert, oriented, appears well and in no acute distress  HEENT: atraumatic, conjunttiva clear, no obvious abnormalities on inspection of external nose and ears  NECK: normal movements of the head and neck  LUNGS: on inspection no signs of respiratory distress, breathing rate appears normal, no obvious gross SOB, gasping or wheezing  CV: no obvious cyanosis  MS: moves all visible extremities without noticeable abnormality  PSYCH/NEURO: pleasant and cooperative, no obvious depression or anxiety, speech and thought processing grossly intact  ASSESSMENT AND PLAN:  Discussed the following assessment and plan:  Sinus congestion -query VURI, RSV, COVID19 Vs sinusitis vs other -trial symptomatic care with nasal saline, short 3 day course  afrin nasal spray, allergy regimen, tx per below if not improving -COVID19 testing, discussed options -work not provided -follow up per below  Facial pain -query VURI vs developing sinusitis given chronic nasal congestin with underlying allergies -she opted for empiric tx with Augmentin 875 bid x 10 days if not improving over the next few days with nasal saline and nasal  decongestant -follow up per below  Allergic rhinitis, unspecified seasonality, unspecified trigger -antihistamine daily  -we discussed possible serious and likely etiologies, options for evaluation and workup, limitations of telemedicine visit vs in person visit, treatment, treatment risks and precautions. Pt prefers to treat via telemedicine empirically rather then risking or undertaking an in person visit at this moment. See above for problem specif recommendations. Patient agrees to seek prompt in person care if worsening, new symptoms arise, or if is not improving with treatment.   I discussed the assessment and treatment plan with the patient. The patient was provided an opportunity to ask questions and all were answered. The patient agreed with the plan and demonstrated an understanding of the instructions.   The patient was advised to call back or seek an in-person evaluation if the symptoms worsen or if the condition fails to improve as anticipated.   Lucretia Kern, DO

## 2019-12-31 LAB — SARS CORONAVIRUS 2 (TAT 6-24 HRS): SARS Coronavirus 2: NEGATIVE

## 2020-01-30 ENCOUNTER — Telehealth: Payer: Self-pay | Admitting: Family Medicine

## 2020-01-30 NOTE — Telephone Encounter (Signed)
No longer needed

## 2020-02-02 ENCOUNTER — Other Ambulatory Visit: Payer: Self-pay

## 2020-02-02 ENCOUNTER — Telehealth (INDEPENDENT_AMBULATORY_CARE_PROVIDER_SITE_OTHER): Payer: 59 | Admitting: Internal Medicine

## 2020-02-02 ENCOUNTER — Encounter: Payer: Self-pay | Admitting: Internal Medicine

## 2020-02-02 VITALS — Ht 69.0 in

## 2020-02-02 DIAGNOSIS — J309 Allergic rhinitis, unspecified: Secondary | ICD-10-CM

## 2020-02-02 DIAGNOSIS — R0981 Nasal congestion: Secondary | ICD-10-CM

## 2020-02-02 DIAGNOSIS — Z9889 Other specified postprocedural states: Secondary | ICD-10-CM | POA: Diagnosis not present

## 2020-02-02 NOTE — Progress Notes (Signed)
Virtual Visit via Video Note  I connected with@ on 02/02/20 at  8:30 AM EDT by a video enabled telemedicine application and verified that I am speaking with the correct person using two identifiers. Location patient: vehicle Location provider:workoffice Persons participating in the virtual visit: patient, provider  [place] national recommendations  regarding COVID 19 pandemic   video visit is advised over in office visit for this patient.  Patient aware  of the limitations of evaluation and management by telemedicine and  availability of in person appointments. and agreed to proceed.   HPI: Deanna [name] presents for video visit  PCP appt NA INset 4 days ago of runny nose congestion  And cough from PND   No fever myalgias  Most on left side had left ear pain   Remote hs of allergy testing not for years  Uses flonase off and on.  Sx are getting better  Today  Had sinus surgery about 3 Years ago and helped sinus problems until this past  Months 4+ getting frequent sinus congestion and problems  Last month had  Sinus infection rx with augmenting  Has tested  Neg covid  In past.    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   NO new pets    ROS: See pertinent positives and negatives per HPI.  Past Medical History:  Diagnosis Date  . Allergy   . GERD (gastroesophageal reflux disease)    "does not need meds anymore"  . Heart murmur   . Hyperlipidemia   . Lactose intolerance   . 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HCC)     Past Surgical History:  Procedure Laterality Date  . ABDOMINAL HYSTERECTOMY  2006   benign tumor, complete  . KNEE ARTHROSCOPY Right   . NASAL SEPTUM SURGERY    . 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 Arthritis Mother   . Hyperlipidemia Mother   . Stroke Mother   . Hypertension Mother    . Colon cancer Neg Hx     Social History   Tobacco Use  . Smoking status: Never Smoker  . Smokeless tobacco: Never Used  Vaping Use  . Vaping Use: Never used  Substance Use Topics  . Alcohol use: No  . Drug use: No      Current Outpatient Medications:  .  clopidogrel (PLAVIX) 75 MG tablet, Take 1 tablet (75 mg total) by mouth daily., Disp: 90 tablet, Rfl: 3 .  meclizine (ANTIVERT) 25 MG tablet, Take 1 tablet (25 mg total) by mouth 3 (three) times daily as needed for dizziness., Disp: 30 tablet, Rfl: 0 .  pravastatin (PRAVACHOL) 40 MG tablet, Take 1 tablet (40 mg total) by mouth daily., Disp: 90 tablet, Rfl: 3 .  pregabalin (LYRICA) 100 MG capsule, Take 1 capsule every night, Disp: 90 capsule, Rfl: 3 .  amoxicillin-clavulanate (AUGMENTIN) 875-125 MG tablet, Take 1 tablet by mouth 2 (two) times daily. (Patient not taking: Reported on 02/02/2020), Disp: 20 tablet, Rfl: 0 .  fluticasone (FLONASE) 50 MCG/ACT nasal spray, Place 1 spray into both nostrils daily. (Patient not taking: Reported on 12/09/2019), Disp: 16 g, Rfl: 6 .  neomycin-polymyxin-hydrocortisone (CORTISPORIN) OTIC solution, Apply 1-2 drops to toe after soaking BID, Disp: 10 mL, Rfl: 1  EXAM: BP Readings from Last 3 Encounters:  12/30/19 137/82  12/09/19 (!) 133/85  11/27/19 (!) 149/79    VITALS per patient if applicable:  GENERAL: alert, oriented, appears well and in no acute distress mild congestion nl speech no cough during   Visit  HEENT: atraumatic, conjunttiva clear, no obvious abnormalities on inspection of external nose and ears  NECK: normal movements of the head and neck LUNGS: on inspection no signs of respiratory distress, breathing rate appears normal, no obvious gross SOB, gasping or wheezing CV: no obvious cyanosis  PSYCH/NEURO: pleasant and cooperative, no obvious depression or anxiety, speech and thought processing grossly intact   ASSESSMENT AND PLAN:  Discussed the following assessment and  plan:    ICD-10-CM   1. Sinus congestion  R09.81    prob viral uri resolving  works in child care  2 yos current  2. Allergic rhinitis, unspecified seasonality, unspecified trigger  J30.9    probably   3. H/O sinus surgery  Z98.890    Antibiotic not indicated at this time  Suggest  Reg use of flonase and saline and if  persistent or progressive  Consider  Updated allergy eval suspect  Young child care work  w exposures is  A major trigger .  consider obt rapid covid testing when available  She had the moderna vaccine  But cares for older mom  Counseled.   Expectant management and discussion of plan and treatment with opportunity to ask questions and all were answered. The patient agreed with the plan and demonstrated an understanding of the instructions.   Advised to call back or seek an in-person evaluation if worsening  or having  further concerns . Return if symptoms worsen or fail to improve.    Shanon Ace, MD

## 2020-04-20 ENCOUNTER — Other Ambulatory Visit: Payer: Self-pay | Admitting: Family Medicine

## 2020-04-20 DIAGNOSIS — J069 Acute upper respiratory infection, unspecified: Secondary | ICD-10-CM

## 2020-05-18 ENCOUNTER — Telehealth (INDEPENDENT_AMBULATORY_CARE_PROVIDER_SITE_OTHER): Payer: 59 | Admitting: Family Medicine

## 2020-05-18 DIAGNOSIS — J329 Chronic sinusitis, unspecified: Secondary | ICD-10-CM | POA: Diagnosis not present

## 2020-05-18 MED ORDER — PREDNISONE 10 MG PO TABS: ORAL_TABLET | ORAL | 0 refills | Status: DC

## 2020-05-18 MED ORDER — AMOXICILLIN-POT CLAVULANATE 875-125 MG PO TABS: 1.0000 | ORAL_TABLET | Freq: Two times a day (BID) | ORAL | 0 refills | Status: DC

## 2020-05-18 NOTE — Progress Notes (Signed)
Patient ID: Deanna [name], female   DOB: 1960/12/05, 60 y.o.   MRN: [id number]  This visit type was conducted due to national recommendations for restrictions regarding the COVID-19 pandemic in an effort to limit this patient's exposure and mitigate transmission in our community.   Virtual Visit via Telephone Note  I connected with Deanna [name] on 05/18/20 at  8:15 AM EST by telephone and verified that I am speaking with the correct person using two identifiers.   I discussed the limitations, risks, security and privacy concerns of performing an evaluation and management service by telephone and the availability of in person appointments. I also discussed with the patient that there may be a patient responsible charge related to this service. The patient expressed understanding and agreed to proceed.  Location patient: home Location provider: work or home office Participants present for the call: patient, provider Patient did not have a visit in the prior 7 days to address this/these issue(s).   History of Present Illness:   Ms. Dantin had onset last week of some headaches, sinus congestion, facial pain.  Minimal cough.  She had episode of vomiting Saturday but none since then.  She thinks the vomiting was related to postnasal drip.  No fever.  Had one episode of nosebleed.  She has long history of sinus problems and has had prior sinus surgery.  She states she had 3 sinus infections this past 6 months.  She is tried over-the-counter Aleve and saline nasal washes without improvement.  She is having increasing facial pain and headache typical of prior sinus infections.  No dyspnea.  No sick contacts.  Past Medical History:  Diagnosis Date  . Allergy   . GERD (gastroesophageal reflux disease)    "does not need meds anymore"  . Heart murmur   . Hyperlipidemia   . Lactose intolerance   . 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HCC)    Past Surgical History:  Procedure  Laterality Date  . ABDOMINAL HYSTERECTOMY  2006   benign tumor, complete  . KNEE ARTHROSCOPY Right   . NASAL SEPTUM SURGERY    . 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 She has never used smokeless tobacco. She reports that she does not drink alcohol and does not use drugs. family history includes Arthritis in her mother; Hyperlipidemia in her mother; Hypertension in her mother; Stroke in her mother. Allergies  Allergen Reactions  . Eggs Or Egg-Derived Products Nausea And Vomiting  . Lactose Intolerance (Gi) Other (See Comments)    vomiting    Observations/Objective: Patient sounds cheerful and well on the phone. I do not appreciate any SOB. Speech and thought processing are grossly intact. Patient reported vitals:  Assessment and Plan:  Probable recurrent sinusitis  -Augmentin 875 mg twice daily with food -Prednisone taper over the next 9 days.  We elected to add prednisone since she has had good success with this combination in the past. -Follow-up with primary if symptoms not fully resolving over the next couple of weeks  Follow Up Instructions:  -As above   99441 5-10 99442 11-20 99443 21-30 I did not refer this patient for an OV in the next 24 hours for this/these issue(s).  I discussed the assessment and treatment plan with the patient. The patient was provided an opportunity to ask questions and all were answered. The patient agreed with the plan and demonstrated an understanding of the instructions.   The patient was advised  to call back or seek an in-person evaluation if the symptoms worsen or if the condition fails to improve as anticipated.  I provided 12 minutes of non-face-to-face time during this encounter.   Evelena Peat, MD

## 2020-06-04 ENCOUNTER — Encounter: Payer: 59 | Admitting: Family Medicine

## 2020-06-09 ENCOUNTER — Other Ambulatory Visit: Payer: Self-pay

## 2020-06-09 ENCOUNTER — Encounter: Payer: Self-pay | Admitting: Family Medicine

## 2020-06-09 ENCOUNTER — Ambulatory Visit (INDEPENDENT_AMBULATORY_CARE_PROVIDER_SITE_OTHER): Payer: 59 | Admitting: Family Medicine

## 2020-06-09 VITALS — BP 128/82 | HR 66 | Temp 98.3°F | Ht 69.0 in | Wt 222.4 lb

## 2020-06-09 DIAGNOSIS — Z0000 Encounter for general adult medical examination without abnormal findings: Secondary | ICD-10-CM | POA: Diagnosis not present

## 2020-06-09 DIAGNOSIS — I77810 Thoracic aortic ectasia: Secondary | ICD-10-CM | POA: Diagnosis not present

## 2020-06-09 DIAGNOSIS — E782 Mixed hyperlipidemia: Secondary | ICD-10-CM | POA: Diagnosis not present

## 2020-06-09 DIAGNOSIS — M722 Plantar fascial fibromatosis: Secondary | ICD-10-CM | POA: Diagnosis not present

## 2020-06-09 DIAGNOSIS — Z6832 Body mass index (BMI) 32.0-32.9, adult: Secondary | ICD-10-CM | POA: Diagnosis not present

## 2020-06-09 DIAGNOSIS — E6609 Other obesity due to excess calories: Secondary | ICD-10-CM | POA: Diagnosis not present

## 2020-06-09 LAB — BASIC METABOLIC PANEL
BUN: 11 mg/dL (ref 6–23)
CO2: 29 mEq/L (ref 19–32)
Calcium: 9.6 mg/dL (ref 8.4–10.5)
Chloride: 104 mEq/L (ref 96–112)
Creatinine, Ser: 0.77 mg/dL (ref 0.40–1.20)
GFR: 85.66 mL/min (ref >60.00)
Glucose, Bld: 78 mg/dL (ref 70–99)
Potassium: 4.1 mEq/L (ref 3.5–5.1)
Sodium: 139 mEq/L (ref 135–145)

## 2020-06-09 LAB — CBC WITH DIFFERENTIAL/PLATELET
Basophils Absolute: 0 10*3/uL (ref 0.0–0.1)
Basophils Relative: 0.5 % (ref 0.0–3.0)
Eosinophils Absolute: 0.2 10*3/uL (ref 0.0–0.7)
Eosinophils Relative: 3.2 % (ref 0.0–5.0)
HCT: 35.2 % — ABNORMAL LOW (ref 36.0–46.0)
Hemoglobin: 11.5 g/dL — ABNORMAL LOW (ref 12.0–15.0)
Lymphocytes Relative: 29.6 % (ref 12.0–46.0)
Lymphs Abs: 2 10*3/uL (ref 0.7–4.0)
MCHC: 32.5 g/dL (ref 30.0–36.0)
MCV: 86.9 fl (ref 78.0–100.0)
Monocytes Absolute: 0.7 10*3/uL (ref 0.1–1.0)
Monocytes Relative: 9.6 % (ref 3.0–12.0)
Neutro Abs: 3.9 10*3/uL (ref 1.4–7.7)
Neutrophils Relative %: 57.1 % (ref 43.0–77.0)
Platelets: 393 10*3/uL (ref 150.0–400.0)
RBC: 4.05 Mil/uL (ref 3.87–5.11)
RDW: 14.2 % (ref 11.5–15.5)
WBC: 6.8 10*3/uL (ref 4.0–10.5)

## 2020-06-09 LAB — HEMOGLOBIN A1C: Hgb A1c MFr Bld: 6.2 % (ref 4.6–6.5)

## 2020-06-09 LAB — LIPID PANEL
Cholesterol: 198 mg/dL (ref 0–200)
HDL: 46.8 mg/dL (ref >39.00)
LDL Cholesterol: 138 mg/dL — ABNORMAL HIGH (ref 0–99)
NonHDL: 150.88
Total CHOL/HDL Ratio: 4
Triglycerides: 65 mg/dL (ref 0.0–149.0)
VLDL: 13 mg/dL (ref 0.0–40.0)

## 2020-06-09 LAB — T4, FREE: Free T4: 0.85 ng/dL (ref 0.60–1.60)

## 2020-06-09 LAB — TSH: TSH: 1.1 u[IU]/mL (ref 0.35–4.50)

## 2020-06-09 NOTE — Patient Instructions (Addendum)
Preventive Care 84-58 Years Old, Female Preventive care refers to lifestyle choices and visits with your health care provider that can promote health and wellness. This includes:  A yearly physical exam. This is also called an annual wellness visit.  Regular dental and eye exams.  Immunizations.  Screening for certain conditions.  Healthy lifestyle choices, such as: ? Eating a healthy diet. ? Getting regular exercise. ? Not using drugs or products that contain nicotine and tobacco. ? Limiting alcohol use. What can I expect for my preventive care visit? Physical exam Your health care provider will check your:  Height and weight. These may be used to calculate your BMI (body mass index). BMI is a measurement that tells if you are at a healthy weight.  Heart rate and blood pressure.  Body temperature.  Skin for abnormal spots. Counseling Your health care provider may ask you questions about your:  Past medical problems.  Family's medical history.  Alcohol, tobacco, and drug use.  Emotional well-being.  Home life and relationship well-being.  Sexual activity.  Diet, exercise, and sleep habits.  Work and work Statistician.  Access to firearms.  Method of birth control.  Menstrual cycle.  Pregnancy history. What immunizations do I need? Vaccines are usually given at various ages, according to a schedule. Your health care provider will recommend vaccines for you based on your age, medical history, and lifestyle or other factors, such as travel or where you work.   What tests do I need? Blood tests  Lipid and cholesterol levels. These may be checked every 5 years, or more often if you are over 3 years old.  Hepatitis C test.  Hepatitis B test. Screening  Lung cancer screening. You may have this screening every year starting at age 73 if you have a 30-pack-year history of smoking and currently smoke or have quit within the past 15 years.  Colorectal cancer  screening. ? All adults should have this screening starting at age 52 and continuing until age 17. ? Your health care provider may recommend screening at age 49 if you are at increased risk. ? You will have tests every 1-10 years, depending on your results and the type of screening test.  Diabetes screening. ? This is done by checking your blood sugar (glucose) after you have not eaten for a while (fasting). ? You may have this done every 1-3 years.  Mammogram. ? This may be done every 1-2 years. ? Talk with your health care provider about when you should start having regular mammograms. This may depend on whether you have a family history of breast cancer.  BRCA-related cancer screening. This may be done if you have a family history of breast, ovarian, tubal, or peritoneal cancers.  Pelvic exam and Pap test. ? This may be done every 3 years starting at age 10. ? Starting at age 11, this may be done every 5 years if you have a Pap test in combination with an HPV test. Other tests  STD (sexually transmitted disease) testing, if you are at risk.  Bone density scan. This is done to screen for osteoporosis. You may have this scan if you are at high risk for osteoporosis. Talk with your health care provider about your test results, treatment options, and if necessary, the need for more tests. Follow these instructions at home: Eating and drinking  Eat a diet that includes fresh fruits and vegetables, whole grains, lean protein, and low-fat dairy products.  Take vitamin and mineral supplements  as recommended by your health care provider.  Do not drink alcohol if: ? Your health care provider tells you not to drink. ? You are pregnant, may be pregnant, or are planning to become pregnant.  If you drink alcohol: ? Limit how much you have to 0-1 drink a day. ? Be aware of how much alcohol is in your drink. In the [place] one drink equals one 12 oz bottle of beer (355 mL), one 5 oz glass of  wine (148 mL), or one 1 oz glass of hard liquor (44 mL).   Lifestyle  Take daily care of your teeth and gums. Brush your teeth every morning and night with fluoride toothpaste. Floss one time each day.  Stay active. Exercise for at least 30 minutes 5 or more days each week.  Do not use any products that contain nicotine or tobacco, such as cigarettes, e-cigarettes, and chewing tobacco. If you need help quitting, ask your health care provider.  Do not use drugs.  If you are sexually active, practice safe sex. Use a condom or other form of protection to prevent STIs (sexually transmitted infections).  If you do not wish to become pregnant, use a form of birth control. If you plan to become pregnant, see your health care provider for a prepregnancy visit.  If told by your health care provider, take low-dose aspirin daily starting at age 64.  Find healthy ways to cope with stress, such as: ? Meditation, yoga, or listening to music. ? Journaling. ? Talking to a trusted person. ? Spending time with friends and family. Safety  Always wear your seat belt while driving or riding in a vehicle.  Do not drive: ? If you have been drinking alcohol. Do not ride with someone who has been drinking. ? When you are tired or distracted. ? While texting.  Wear a helmet and other protective equipment during sports activities.  If you have firearms in your house, make sure you follow all gun safety procedures. What's next?  Visit your health care provider once a year for an annual wellness visit.  Ask your health care provider how often you should have your eyes and teeth checked.  Stay up to date on all vaccines. This information is not intended to replace advice given to you by your health care provider. Make sure you discuss any questions you have with your health care provider. Document Revised: 02/03/2020 Document Reviewed: 01/10/2018 Elsevier Patient Education  2021 [employer].  Plantar Fasciitis  Plantar fasciitis is a painful foot condition that affects the heel. It occurs when the band of tissue that connects the toes to the heel bone (plantar fascia) becomes irritated. This can happen as the result of exercising too much or doing other repetitive activities (overuse injury). Plantar fasciitis can cause mild irritation to severe pain that makes it difficult to walk or move. The pain is usually worse in the morning after sleeping, or after sitting or lying down for a period of time. Pain may also be worse after long periods of walking or standing. What are the causes? This condition may be caused by:  Standing for long periods of time.  Wearing shoes that do not have good arch support.  Doing activities that put stress on joints (high-impact activities). This includes ballet and exercise that makes your heart beat faster (aerobic exercise), such as running.  Being overweight.  An abnormal way of walking (gait).  Tight muscles in the back of your lower leg (  calf).  High arches in your feet or flat feet.  Starting a new athletic activity. What are the signs or symptoms? The main symptom of this condition is heel pain. Pain may get worse after the following:  Taking the first steps after a time of rest, especially in the morning after awakening, or after you have been sitting or lying down for a while.  Long periods of standing still. Pain may decrease after 30-45 minutes of activity, such as gentle walking. How is this diagnosed? This condition may be diagnosed based on your medical history, a physical exam, and your symptoms. Your health care provider will check for:  A tender area on the bottom of your foot.  A high arch in your foot or flat feet.  Pain when you move your foot.  Difficulty moving your foot. You may have imaging tests to confirm the diagnosis, such as:  X-rays.  Ultrasound.  MRI. How is this treated? Treatment for  plantar fasciitis depends on how severe your condition is. Treatment may include:  Rest, ice, pressure (compression), and raising (elevating) the affected foot. This is called RICE therapy. Your health care provider may recommend RICE therapy along with over-the-counter pain medicines to manage your pain.  Exercises to stretch your calves and your plantar fascia.  A splint that holds your foot in a stretched, upward position while you sleep (night splint).  Physical therapy to relieve symptoms and prevent problems in the future.  Injections of steroid medicine (cortisone) to relieve pain and inflammation.  Stimulating your plantar fascia with electrical impulses (extracorporeal shock wave therapy). This is usually the last treatment option before surgery.  Surgery, if other treatments have not worked after 12 months. Follow these instructions at home: Managing pain, stiffness, and swelling  If directed, put ice on the painful area. To do this: ? Put ice in a plastic bag, or use a frozen bottle of water. ? Place a towel between your skin and the bag or bottle. ? Roll the bottom of your foot over the bag or bottle. ? Do this for 20 minutes, 2-3 times a day.  Wear athletic shoes that have air-sole or gel-sole cushions, or try soft shoe inserts that are designed for plantar fasciitis.  Elevate your foot above the level of your heart while you are sitting or lying down.   Activity  Avoid activities that cause pain. Ask your health care provider what activities are safe for you.  Do physical therapy exercises and stretches as told by your health care provider.  Try activities and forms of exercise that are easier on your joints (low impact). Examples include swimming, water aerobics, and biking. General instructions  Take over-the-counter and prescription medicines only as told by your health care provider.  Wear a night splint while sleeping, if told by your health care provider.  Loosen the splint if your toes tingle, become numb, or turn cold and blue.  Maintain a healthy weight, or work with your health care provider to lose weight as needed.  Keep all follow-up visits. This is important. Contact a health care provider if you have:  Symptoms that do not go away with home treatment.  Pain that gets worse.  Pain that affects your ability to move or do daily activities. Summary  Plantar fasciitis is a painful foot condition that affects the heel. It occurs when the band of tissue that connects the toes to the heel bone (plantar fascia) becomes irritated.  Heel pain is the  main symptom of this condition. It may get worse after exercising too much or standing still for a long time.  Treatment varies, but it usually starts with rest, ice, pressure (compression), and raising (elevating) the affected foot. This is called RICE therapy. Over-the-counter medicines can also be used to manage pain. This information is not intended to replace advice given to you by your health care provider. Make sure you discuss any questions you have with your health care provider. Document Revised: 08/18/2019 Document Reviewed: 08/18/2019 Elsevier Patient Education  2021 [place].  Plantar Fasciitis Rehab Ask your health care provider which exercises are safe for you. Do exercises exactly as told by your health care provider and adjust them as directed. It is normal to feel mild stretching, pulling, tightness, or discomfort as you do these exercises. Stop right away if you feel sudden pain or your pain gets worse. Do not begin these exercises until told by your health care provider. Stretching and range-of-motion exercises These exercises warm up your muscles and joints and improve the movement and flexibility of your foot. These exercises also help to relieve pain. Plantar fascia stretch 1. Sit with your left / right leg crossed over your opposite knee. 2. Hold your heel with one  hand with that thumb near your arch. With your other hand, hold your toes and gently pull them back toward the top of your foot. You should feel a stretch on the base (bottom) of your toes, or the bottom of your foot (plantar fascia), or both. 3. Hold this stretch for__________ seconds. 4. Slowly release your toes and return to the starting position. Repeat __________ times. Complete this exercise __________ times a day.   Gastrocnemius stretch, standing This exercise is also called a calf (gastroc) stretch. It stretches the muscles in the back of the upper calf. 1. Stand with your hands against a wall. 2. Extend your left / right leg behind you, and bend your front knee slightly. 3. Keeping your heels on the floor, your toes facing forward, and your back knee straight, shift your weight toward the wall. Do not arch your back. You should feel a gentle stretch in your upper calf. 4. Hold this position for __________ seconds. Repeat __________ times. Complete this exercise __________ times a day.   Soleus stretch, standing This exercise is also called a calf (soleus) stretch. It stretches the muscles in the back of the lower calf. 1. Stand with your hands against a wall. 2. Extend your left / right leg behind you, and bend your front knee slightly. 3. Keeping your heels on the floor and your toes facing forward, bend your back knee and shift your weight slightly over your back leg. You should feel a gentle stretch deep in your lower calf. 4. Hold this position for __________ seconds. Repeat __________ times. Complete this exercise __________ times a day. Gastroc and soleus stretch, standing step This exercise stretches the muscles in the back of the lower leg. These muscles are in the upper calf (gastrocnemius) and the lower calf (soleus). 1. Stand with the ball of your left / right foot on the front of a step. The ball of your foot is on the walking surface, right under your toes. 2. Keep your  other foot firmly on the same step. 3. Hold on to the wall or a railing for balance. 4. Slowly lift your other foot, allowing your body weight to press your heel down over the edge of the front of the  step. Keep knee straight and unbent. You should feel a stretch in your calf. 5. Hold this position for __________ seconds. 6. Return both feet to the step. 7. Repeat this exercise with a slight bend in your left / right knee. Repeat __________ times with your left / right knee straight and __________ times with your left / right knee bent. Complete this exercise __________ times a day. Balance exercise This exercise builds your balance and strength control of your arch to help take pressure off your plantar fascia. Single leg stand If this exercise is too easy, you can try it with your eyes closed or while standing on a pillow. 1. Without shoes, stand near a railing or in a doorway. You may hold on to the railing or door frame as needed. 2. Stand on your left / right foot. Keep your big toe down on the floor and lift the arch of your foot. You should feel a stretch across the bottom of your foot and your arch. Do not let your foot roll inward. 3. Hold this position for __________ seconds. Repeat __________ times. Complete this exercise __________ times a day. This information is not intended to replace advice given to you by your health care provider. Make sure you discuss any questions you have with your health care provider. Document Revised: 02/12/2020 Document Reviewed: 02/12/2020 Elsevier Patient Education  2021 [place] [employer] (9th ed., pp. [phone]). [place], [place]: [employer].">  Stress, Adult Stress is a normal reaction to life events. Stress is what you feel when life demands more than you are used to, or more than you think you can handle. Some stress can be useful, such as studying for a test or meeting a deadline at work. Stress that occurs too often or for  too long can cause problems. It can affect your emotional health and interfere with relationships and normal daily activities. Too much stress can weaken your body's defense system (immune system) and increase your risk for physical illness. If you already have a medical problem, stress can make it worse. What are the causes? All sorts of life events can cause stress. An event that causes stress for one person may not be stressful for another person. Major life events, whether positive or negative, commonly cause stress. Examples include:  Losing a job or starting a new job.  Losing a loved one.  Moving to a new town or home.  Getting married or divorced.  Having a baby.  Getting injured or sick. Less obvious life events can also cause stress, especially if they occur day after day or in combination with each other. Examples include:  Working long hours.  Driving in traffic.  Caring for children.  Being in debt.  Being in a difficult relationship. What are the signs or symptoms? Stress can cause emotional symptoms, including:  Anxiety. This is feeling worried, afraid, on edge, overwhelmed, or out of control.  Anger, including irritation or impatience.  Depression. This is feeling sad, down, helpless, or guilty.  Trouble focusing, remembering, or making decisions. Stress can cause physical symptoms, including:  Aches and pains. These may affect your head, neck, back, stomach, or other areas of your body.  Tight muscles or a clenched jaw.  Low energy.  Trouble sleeping. Stress can cause unhealthy behaviors, including:  Eating to feel better (overeating) or skipping meals.  Working too much or putting off tasks.  Smoking, drinking alcohol, or using drugs to feel better. How is this  diagnosed? Stress is diagnosed through an assessment by your health care provider. He or she may diagnose this condition based on:  Your symptoms and any stressful life events.  Your  medical history.  Tests to rule out other causes of your symptoms. Depending on your condition, your health care provider may refer you to a specialist for further evaluation. How is this treated? Stress management techniques are the recommended treatment for stress. Medicine is not typically recommended for the treatment of stress. Techniques to reduce your reaction to stressful life events include:  Stress identification. Monitor yourself for symptoms of stress and identify what causes stress for you. These skills may help you to avoid or prepare for stressful events.  Time management. Set your priorities, keep a calendar of events, and learn to say no. Taking these actions can help you avoid making too many commitments. Techniques for coping with stress include:  Rethinking the problem. Try to think realistically about stressful events rather than ignoring them or overreacting. Try to find the positives in a stressful situation rather than focusing on the negatives.  Exercise. Physical exercise can release both physical and emotional tension. The key is to find a form of exercise that you enjoy and do it regularly.  Relaxation techniques. These relax the body and mind. The key is to find one or more that you enjoy and use the techniques regularly. Examples include: ? Meditation, deep breathing, or progressive relaxation techniques. ? Yoga or tai chi. ? Biofeedback, mindfulness techniques, or journaling. ? Listening to music, being out in nature, or participating in other hobbies.  Practicing a healthy lifestyle. Eat a balanced diet, drink plenty of water, limit or avoid caffeine, and get plenty of sleep.  Having a strong support network. Spend time with family, friends, or other people you enjoy being around. Express your feelings and talk things over with someone you trust. Counseling or talk therapy with a mental health professional may be helpful if you are having trouble managing  stress on your own.   Follow these instructions at home: Lifestyle  Avoid drugs.  Do not use any products that contain nicotine or tobacco, such as cigarettes, e-cigarettes, and chewing tobacco. If you need help quitting, ask your health care provider.  Limit alcohol intake to no more than 1 drink a day for nonpregnant women and 2 drinks a day for men. One drink equals 12 oz of beer, 5 oz of wine, or 1 oz of hard liquor  Do not use alcohol or drugs to relax.  Eat a balanced diet that includes fresh fruits and vegetables, whole grains, lean meats, fish, eggs, and beans, and low-fat dairy. Avoid processed foods and foods high in added fat, sugar, and salt.  Exercise at least 30 minutes on 5 or more days each week.  Get 7-8 hours of sleep each night.   General instructions  Practice stress management techniques as discussed with your health care provider.  Drink enough fluid to keep your urine clear or pale yellow.  Take over-the-counter and prescription medicines only as told by your health care provider.  Keep all follow-up visits as told by your health care provider. This is important.   Contact a health care provider if:  Your symptoms get worse.  You have new symptoms.  You feel overwhelmed by your problems and can no longer manage them on your own. Get help right away if:  You have thoughts of hurting yourself or others. If you ever feel like you  may hurt yourself or others, or have thoughts about taking your own life, get help right away. You can go to your nearest emergency department or call:  Your local emergency services (911 in the [place]  A suicide crisis helpline, such as the [place] at (236) [phone]. This is open 24 hours a day. Summary  Stress is a normal reaction to life events. It can cause problems if it happens too often or for too long.  Practicing stress management techniques is the best way to treat stress.  Counseling  or talk therapy with a mental health professional may be helpful if you are having trouble managing stress on your own. This information is not intended to replace advice given to you by your health care provider. Make sure you discuss any questions you have with your health care provider. Document Revised: 01/16/2020 Document Reviewed: 01/16/2020 Elsevier Patient Education  2021 [place].  Alzheimer's Disease Caregiver Guide Alzheimer's disease is a brain disease that causes memory loss and changes in behavior. People with Alzheimer's disease often have problems paying attention, communicating, and doing routine tasks. The disease gets worse over time, and people with the disease eventually need full-time care. Taking care of someone with Alzheimer's disease can be challenging and overwhelming. This guide provides helpful information and tips that can make caring for someone with the disease a little easier. How to help manage lifestyle changes Managing symptoms  Be calm and patient.  Give short, simple answers to questions. Long answers can overwhelm and confuse the person.  Avoid correcting the person in a negative way.  Try not to take things personally, even if the person forgets your name. Understand that changes are a part of the disease process.  Do not argue or try to convince the person about a specific point. Doing that may make the person feel more agitated. Reducing frustration  Make appointments and do daily tasks, like bathing and dressing, when the person is at his or her best.  Allow for plenty of time for simple tasks because they may take longer than expected. Take your time when doing these tasks.  Limit the person's choices. Too many choices can be overwhelming and stressful for the person.  Involve the person in what you are doing.  Take steps to help keep things organized such as: ? Keeping a daily routine. ? Organizing medicines in a pillbox for each day  of the week. ? Keeping a calendar in a central location to remind the person of appointments or other activities.  Avoid crowds and new situations, if possible.  Use simple words, short sentences, and a calm voice. Only give one direction at a time.  Buy clothes and shoes for the person that are easy to put on and take off.  Try to change the subject or topic if the person becomes frustrated or angry. Distraction is a great technique for making a situation less tense. Reducing the risk of injury  Keep floors clear of clutter. Remove rugs, magazine racks, and floor lamps.  Keep hallways well-lit, especially at night.  Put a handrail and nonslip mat in the bathtub or shower.  Put childproof locks on cabinets that contain dangerous items, such as medicines, alcohol, guns, toxic cleaning items, sharp tools or utensils, matches, and lighters.  Put locks on doors. Put the locks in places where the person cannot see or reach them easily. This will help ensure that the person does not wander out of the house and  get lost.  Be prepared for emergencies. Keep a list of emergency phone numbers and addresses in a convenient area.  Remove car keys and lock garage doors so that the person does not try to get in the car and drive.  A certain type of bracelet may be worn that tracks a person's location and identifies him or her as having memory problems. This should be worn at all times for safety.   Planning for the future  Discuss financial and legal planning early on in the course of the disease. People with Alzheimer's disease will have trouble managing their money as the disease gets worse. Get help from professional advisers regarding financial and legal matters.  Discuss advance directives, safety, and daily care. Take these steps: ? Create a living will and choose a power of attorney. The person with power of attorney will be able to make decisions for the person with Alzheimer's disease when  he or she is no longer able to. ? Discuss driving safety and when to stop driving. The person's health care provider can help provide assistance with this decision. ? Discuss the person's living situation. If the person lives alone, make sure he or she is safe. People who live at home may need extra help from home health caregivers, and those who live in a nursing home or care center may need more care.   How to recognize changes in the person's condition Alzheimer's disease causes a person to lose the ability to remember things and make decisions. Memory loss and confusion are usually mild at the start of the disease, but they slowly get more severe over time. Eventually, the person may not recognize friends, family members, or familiar places. Alzheimer's disease can also cause hallucinations, changes in behavior, and changes in mood, such as anxiety or anger. The changes can come on suddenly. They may happen in response to something such as:  Pain.  An infection.  Changes in environment, such as changes in temperature or noise.  Overstimulation.  Feeling lost or scared.  Medicines. Where to find support  Ask about respite care resources. Respite care can provide short-term care for the person so that you can have a regular break from the stress of caregiving.  Consider joining a local support group. Advantages of being part of a support group include: ? Learning strategies to manage stress. ? Sharing experiences with others. ? Receiving emotional comfort and support. ? Learning about caregiving as the disease progresses. ? Knowing what community resources are available and making use of them. Where to find more information  Alzheimer's Association: [employer] Contact a health care provider if:  The person has a fever.  The person has a sudden change in behavior that does not improve with calming strategies.  The person is unable to manage in his or her current living  situation.  You are no longer able to care for the person. Get help right away if:  The person has a sudden increase in confusion or new hallucinations.  The person threatens himself or herself, you, or anyone else. If you ever feel like your loved one may hurt himself or herself or others, or shares thoughts about taking his or her own life, get help right away. You can go to your nearest emergency department or:  Call your local emergency services (911 in the [place]  Call a suicide crisis helpline, such as the [facility] at 6032908299. This is open 24 hours a day in the  [place]  Text the Crisis Text Line at (908)728-7264 (in the [place] Summary  Alzheimer's disease is a brain disease that causes memory loss and changes in behavior.  People with Alzheimer's disease often have problems paying attention, communicating, and doing routine tasks. The disease gets worse over time, and people with the disease eventually need full-time care.  Take steps to reduce the person's risk of injury and plan for future care.  Taking care of someone with Alzheimer's disease can be very challenging and overwhelming. One way to find support during this time is to join a local support group. This information is not intended to replace advice given to you by your health care provider. Make sure you discuss any questions you have with your health care provider. Document Revised: 08/18/2019 Document Reviewed: 08/18/2019 Elsevier Patient Education  2021 [employer].  Vascular Dementia Dementia is a condition in which a person has problems with thinking, memory, and behavior that are severe enough to interfere with daily life. Vascular dementia is a type of dementia. It results from brain damage that is caused by the brain not getting enough blood. This condition may also be called vascular cognitive impairment. What are the causes? Vascular dementia is caused by conditions that reduce  blood flow to the brain. Common causes of this condition include:  Multiple small strokes. These may happen without symptoms (silent stroke).  Major stroke.  Damage to small blood vessels in the brain (cerebral small vessel disease). What increases the risk? The following factors may make you more likely to develop this condition:  Having had a stroke.  Having high blood pressure (hypertension) or high cholesterol.  Having a disease that affects the heart or blood vessels.  Smoking.  Not being active.  Being over age 84.  Having any of these conditions: ? Diabetes. ? Metabolic syndrome. ? Obesity. ? Depression. ? A genetic condition that leads to stroke, such as CADASIL (cerebral autosomal dominant arteriopathy with subcortical infarcts and leukoencephalopathy). What are the signs or symptoms? Symptoms of vascular dementia can vary from one person to another. Symptoms may be mild or severe depending on the amount of damage and which parts of the brain have been affected. Symptoms may begin suddenly or may develop slowly. Mental symptoms may include:  Confusion and memory problems.  Poor attention and concentration.  Trouble understanding speech.  Depression.  Personality changes.  Trouble recognizing familiar people.  Agitation or aggression.  Paranoia or false beliefs (delusions).  Hallucinations. These involve seeing, hearing, tasting, smelling, or feeling things that are not real. Physical symptoms may include:  Weakness.  Poor balance.  Loss of bladder or bowel control (incontinence).  Unsteady walking (gait).  Speaking problems. Behavioral symptoms may include:  Getting lost in familiar places.  Problems with planning and judgment.  Trouble following instructions.  Social problems.  Emotional outbursts.  Trouble with daily activities and self-care.  Problems handling money. Symptoms may remain stable, or they may get worse over time.  Symptoms of vascular dementia may be similar to those of Alzheimer's disease. The two conditions can occur together (mixed dementia). How is this diagnosed? This condition may be diagnosed based on:  Your medical history and a physical exam.  Symptoms or changes that are reported by friends and family.  Lab tests or other tests that check brain and nervous system function. Tests may include: ? Blood tests. ? Brain imaging tests. ? Tests of movement, speech, and other daily activities (neurological exam). ? Tests of memory,  thinking, and problem-solving (neuropsychological or neurocognitive testing). There is not a specific test to diagnose vascular dementia. Diagnosis may involve several specialists. These may include:  A health care provider who specializes in the brain and nervous system (neurologist).  A health care provider who specializes in understanding how problems in the brain can alter behavior and cognitive function (neuropsychologist). How is this treated? There is no cure for vascular dementia. Brain damage that has already occurred cannot be reversed. Treatment depends on:  How severe the condition is.  Which parts of your brain have been affected.  Your overall health. Treatment measures aim to:  Treat the underlying cause of vascular dementia and manage risk factors. This may include: ? Controlling blood pressure or lowering cholesterol. ? Treating diabetes. ? Making lifestyle changes, such as quitting smoking or losing weight.  Manage symptoms.  Prevent further brain damage.  Improve the person's health and quality of life. Treatment for dementia may involve a team of health care providers, including:  A neurologist.  A provider who specializes in disorders of the mind (psychiatrist).  A provider who specializes in helping people learn daily living skills (occupational therapist).  A provider who focuses on speech and language changes (Public relations account executive).  A heart specialist (cardiologist).  A provider who helps people learn how to manage physical changes, such as movement and walking (exercise physiologist or physical therapist). Follow these instructions at home: Medicines  Take over-the-counter and prescription medicines only as told by your health care provider.  Use a pill organizer or pill reminder to help you manage your medicines. Lifestyle  Do not use any products that contain nicotine or tobacco, such as cigarettes, e-cigarettes, and chewing tobacco. If you need help quitting, ask your health care provider.  Eat a healthy, balanced diet.  Maintain a healthy weight, or lose weight if needed.  Be physically active as told by your health care provider. General instructions  Work with your health care provider to determine what you need help with and what your safety needs are.  Follow the health care provider's instructions for treating the condition that caused the dementia.  Keep all follow-up visits. This is important. If you are the caregiver: People with vascular dementia may need regular help at home or daily care from a family member or home health care worker. Home care for a person with vascular dementia depends on what caused the condition and how severe the symptoms are. General guidelines for caregivers include:  Help the person with dementia remember people, appointments, and daily activities.  Help the person with dementia manage his or her medicines.  Help family and friends learn about ways to communicate with the person with dementia.  Create a safe living space to reduce the risk of injury or falls.  Find a support group to help caregivers and family cope with the effects of dementia.   Where to find more information  [employer] of Neurological Disorders and Stroke: [employer]it Contact a health care provider if:  New behavioral problems develop.  Problems with  swallowing develop.  Confusion gets worse.  Sleepiness gets worse. Get help right away if:  Loss of consciousness occurs.  There is a sudden loss of speech, balance, or thinking ability.  New numbness or paralysis occurs.  Sudden, severe headache occurs.  Vision is lost or suddenly gets worse in one or both eyes. If you ever feel like the person with dementia may hurt himself or herself or others, or if he  or she shares thoughts about taking his or her own life, get help right away. You can go to your nearest emergency department or:  Call your local emergency services (911 in the [place]  Call a suicide crisis helpline, such as the [facility] at 778-193-1459. This is open 24 hours a day in the [place]  Text the Crisis Text Line at (502) 506-2531 (in the [place].). Summary  Vascular dementia is a type of dementia. It results from brain damage that is caused by the brain not getting enough blood.  Vascular dementia is caused by conditions that reduce blood flow to the brain. Common causes of this condition include stroke and damage to small blood vessels in the brain.  Treatment focuses on treating the underlying cause of vascular dementia and managing any risk factors.  People with vascular dementia may need regular help at home or daily care from a family member or home health care worker.  Contact a health care provider if you or your caregiver notices any new symptoms. This information is not intended to replace advice given to you by your health care provider. Make sure you discuss any questions you have with your health care provider. Document Revised: 09/15/2019 Document Reviewed: 09/15/2019 Elsevier Patient Education  [place].

## 2020-06-09 NOTE — Progress Notes (Signed)
Subjective:     Deanna [name] is a 60 y.o. female and is here for a comprehensive physical exam.  Pt notes pain in soles of feet with stepping out of bed in am.  Pain occurs throughout the day.  Trying to wear supportive shoes. Taking plavix for secondary stroke prevention.  Caring for her mother who has dementia.  Pt had 2 doses of Pfizer COVID-19 vaccine on 07/19/19 and 08/09/19.    Social History   Socioeconomic History  . Marital status: Married    Spouse name: Not on file  . Number of children: Not on file  . Years of education: Not on file  . Highest education level: Not on file  Occupational History  . Not on file  Tobacco Use  . Smoking status: Never Smoker  . Smokeless tobacco: Never Used  Vaping Use  . Vaping Use: Never used  Substance and Sexual Activity  . Alcohol use: No  . Drug use: No  . Sexual activity: Not on file  Other Topics Concern  . 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 Samul Dada  10/14/2019  . COVID-19 Vaccine (3 - Booster for Pfizer series) 02/09/2020  . PAP SMEAR-Modifier  06/09/2020 (Originally 12/02/2018)  . INFLUENZA VACCINE  08/12/2021 (Originally 12/14/2019)  . HIV Screening  10/20/2024 (Originally [birth year]1978)  . MAMMOGRAM  09/22/2020  . COLONOSCOPY (Pts 45-12yrs Insurance coverage will need to be confirmed)  03/20/2024  . Hepatitis C Screening  Completed    The  following portions of the patient's history were reviewed and updated as appropriate: allergies, current medications, past family history, past medical history, past social history, past surgical history and problem list.  Review of Systems Pertinent items noted in HPI and remainder of comprehensive ROS otherwise negative.   Objective:    BP 128/82 (BP Location: Right Arm, Patient Position: Sitting, Cuff Size: Large)   Pulse 66   Temp 98.3 F (36.8 C) (Oral)   Ht 5\' 9"  (1.753 m)   Wt 222 lb 6.4 oz (100.9 kg)   SpO2 96%   BMI 32.84 kg/m  General appearance: alert, cooperative and no distress Head: Normocephalic, without obvious abnormality, atraumatic Eyes: conjunctivae/corneas clear. PERRL, EOM's intact. Fundi benign. Ears: normal TM's and external ear canals both ears Nose: Nares normal. Septum midline. Mucosa normal. No drainage or sinus tenderness. Throat: lips, mucosa, and tongue normal; teeth and gums normal Neck: no adenopathy, no carotid bruit, no JVD, supple, symmetrical, trachea midline and thyroid not enlarged, symmetric, no tenderness/mass/nodules Lungs: clear to auscultation bilaterally Heart: regular rate and rhythm, S1, S2 normal, no murmur, click, rub or gallop Abdomen: soft, non-tender; bowel sounds normal; no masses,  no organomegaly Extremities: extremities normal, atraumatic, no cyanosis or edema Pulses: 2+ and  symmetric Skin: Skin color, texture, turgor normal. No rashes or lesions Lymph nodes: Cervical, supraclavicular, and axillary nodes normal. Neurologic: Alert and oriented X 3, normal strength and tone. Normal symmetric reflexes. Normal coordination and gait    Assessment:    Healthy female exam with plantar fasciitis.      Plan:     Anticipatory guidance given including wearing seatbelts, smoke detectors in the home, increasing physical activity, increasing p.o. intake of water and vegetables. -will obtain labs -pap up to date 12/02/18 -colonoscopy  done 04/08/14.  Repeat due 2025 -mammogram done 09/23/19 -given handout -next CPE in 1 yr -See After Visit Summary for Counseling Recommendations    Bilateral plantar fasciitis -discussed treatment options -pt to do stretching exercises and wear supportive shoes -given handout -continue to monitor  Mixed hyperlipidemia  -continue pravastatin 40 mg -lifestyle modifications - Plan: Lipid panel  Aortic root dilatation ([place]) - Plan: CBC with Differential/Platelet, Basic metabolic panel  Class 1 obesity due to excess calories without serious comorbidity with body mass index (BMI) of 32.0 to 32.9 in adult  -discussed lifestyle modifications - Plan: TSH, T4, Free, Hemoglobin A1c, T4, Free, Hemoglobin A1c, TSH  F/u prn   Grier Mitts, MD

## 2020-06-13 ENCOUNTER — Other Ambulatory Visit: Payer: Self-pay | Admitting: Neurology

## 2020-06-26 ENCOUNTER — Encounter: Payer: Self-pay | Admitting: Family Medicine

## 2020-07-13 ENCOUNTER — Ambulatory Visit (INDEPENDENT_AMBULATORY_CARE_PROVIDER_SITE_OTHER): Payer: 59 | Admitting: Neurology

## 2020-07-13 ENCOUNTER — Other Ambulatory Visit: Payer: Self-pay

## 2020-07-13 ENCOUNTER — Encounter: Payer: Self-pay | Admitting: Neurology

## 2020-07-13 VITALS — BP 166/92 | HR 67 | Ht 69.0 in | Wt 222.6 lb

## 2020-07-13 DIAGNOSIS — M5412 Radiculopathy, cervical region: Secondary | ICD-10-CM

## 2020-07-13 MED ORDER — PREGABALIN 50 MG PO CAPS: ORAL_CAPSULE | ORAL | 3 refills | Status: DC

## 2020-07-13 NOTE — Progress Notes (Signed)
NEUROLOGY FOLLOW UP OFFICE NOTE  Deanna [name] [id number] Jul 08, 1960  HISTORY OF PRESENT ILLNESS: I had the pleasure of seeing Deanna [name] in follow-up in the neurology clinic on 07/13/2020.  The patient was last seen 7 months ago for left cervical radiculopathy. She initially reported left arm burning/pain, EMG/NCV of the left arm and leg were normal. 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 Pregabalin increased to 100mg  qhs however she had insomnia and stopped medication. She has completed physical therapy and reports this really helped with her left arm. Burning has resolved, it still feels asleep sometimes. She is noticing her right arm feels like there is pulling on the muscles. She is the primary caregiver for her mother, she tries to avoid lifting her now. She denies any headaches. She has occasional dizzy spells. No falls. Vision is sometimes blurred, she has not seen her eye doctor yet. She reports a lot of fatigue, she would fall asleep quickly when she gets home but has to care for her mother who has dementia and sleep issues. continues on Plavix for secondary stroke prevention, BP today  166/92.   History on Initial Assessment 07/20/2016: This is a pleasant 60 yo RH woman with a history of hyperlipidemia who reported a 3-year history of intermittent episodes of dizziness described as spinning, as well as feeling lightheaded with blurred vision lasting 20-30 seconds. Recently she started having different symptoms, around 2 weeks ago she started getting carsick, which is new for her. A week ago, her left hand was numb, "like it was not there," then 20-30 seconds later it started tingling. Face and leg were unaffected. She did not have any neck pain at that time, but 3 days  later started having a headache with pressure over the frontal regions that she ascribed to her sinuses. She started having pretty significant intermittent nausea. She continued to have dizziness, last episode was a week ago. The headaches have resolved. Blurred vision still comes and goes. She denies any diplopia, dysarthria/dysphagia, back pain, bowel/bladder dysfunction, negative Lhermitte sign. She denies any prior history of focal symptoms, no prior history of vision loss. She denies any head injuries. 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 There is a family history of stroke.  She had an MRI brain with and without contrast last 07/18/2016 which I personally reviewed, there was a faint punctate focus of reduced diffusion in the right frontal lobe deep white matter, difficult to see on the ADC map due to small size, this can be seen with acute ischemia or hyperacute demyelination. There were at least 5 additional subcentimeter white matter, right parietal, medial left thalamus, and left occipital horn FLAIR hyperintensities. There was mild prominence of the ventricles and sulci for age. No abnormal enhancement seen. 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    PAST MEDICAL HISTORY: Past Medical History:  Diagnosis Date  . Allergy   . GERD (gastroesophageal reflux disease)    "does not need meds anymore"  . Heart murmur   . Hyperlipidemia   . Lactose intolerance   . TTP (thrombotic thrombocytopenic purpura)     MEDICATIONS: Current Outpatient Medications on File Prior to Visit  Medication Sig Dispense Refill  . clopidogrel (PLAVIX) 75 MG  tablet Take 1 tablet (75 mg total) by mouth daily. 90 tablet 3  . fluticasone (FLONASE) 50 MCG/ACT nasal spray PLACE 1 SPRAY INTO BOTH NOSTRILS DAILY. 48 mL 2  . pravastatin (PRAVACHOL) 40 MG tablet Take 1 tablet (40 mg total) by mouth daily. 90 tablet 3   No current  facility-administered medications on file prior to visit.    ALLERGIES: Allergies  Allergen Reactions  . Eggs Or Egg-Derived Products Nausea And Vomiting  . Lactose Intolerance (Gi) Other (See Comments)    vomiting    FAMILY HISTORY: Family History  Problem Relation Age of Onset  . Arthritis Mother   . Hyperlipidemia Mother   . Stroke Mother   . Hypertension Mother   . Colon cancer Neg Hx     SOCIAL HISTORY: Social History   Socioeconomic History  . Marital status: Married    Spouse name: Not on file  . Number of children: Not on file  . Years of education: Not on file  . Highest education level: Not on file  Occupational History  . Not on file  Tobacco Use  . Smoking status: Never Smoker  . Smokeless tobacco: Never Used  Vaping Use  . Vaping Use: Never used  Substance and Sexual Activity  . Alcohol use: No  . Drug use: No  . Sexual activity: Not on file  Other Topics Concern  . 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: lives 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, husband and mother      Spiritual Beliefs: Christian      Lifestyle: walks a little a few days per week, diet is ok      Right Handed      Drinks Caffeine         Social Determinants of Health   Financial Resource Strain: Not on file  Food Insecurity: Not on file  Transportation Needs: Not on file  Physical Activity: Not on file  Stress: Not on file  Social Connections: Not on file  Intimate Partner Violence: Not on file     PHYSICAL EXAM: Vitals:   07/13/20 1604  BP: (!) 166/92  Pulse: 67  SpO2: 99%   General: No acute distress Head:  Normocephalic/atraumatic Skin/Extremities: No rash, no edema Neurological Exam: alert and awake. No aphasia or dysarthria. Fund of knowledge is appropriate.  Attention and concentration are normal.   Cranial nerves: Pupils equal, round. Extraocular movements intact with no nystagmus. Visual fields  full.  No facial asymmetry.  Motor: Bulk and tone normal, muscle strength 5/5 throughout with no pronator drift.   Finger to nose testing intact.  Gait narrow-based and steady, able to tandem walk adequately.  Romberg negative.   IMPRESSION: This is a pleasant 60 yo RH woman with a history of hyperlipidemia, who presented initially with a 3-year history of intermittent dizziness suggestive of vertigo that had worsened with nausea, headaches, and a transient episode of left hand numbness and tingling. There have been no further episodes of headaches. MRI brain in 2018 had shown a faint punctate focus of reduced diffusion in the right frontal lobe deep white matter, difficult to see on the ADC map due to small size, this can be seen with acute ischemia or hyperacute demyelination. Repeat MRI did not show any progression or new changes. She is on Plavix for secondary stroke prevention. She continues to have left arm numbness (present since 2018), MRI cervical  spine in 2018 showed left C5 radiculopathy, EMG normal. There was some improvement with Pregabalin but higher dose caused side effects. She is agreeable to restarting lower dose Pregabalin 50mg  qhs. Continue home PT exercises. We again discussed caregiver fatigue, support provided. Follow-up in 6-8 months, she knows to call for any changes.   Thank you for allowing me to participate in her care.  Please do not hesitate to call for any questions or concerns.   Ellouise Newer, M.D.   CC: Dr. Volanda Napoleon

## 2020-07-13 NOTE — Patient Instructions (Addendum)
1. Restart lower dose Pregabalin 50mg  every inght  2. Continue home PT exercises  3. Try to get as much rest as you can  4. Follow-up in 6-8 months, call for any changes

## 2020-07-28 ENCOUNTER — Encounter: Payer: Self-pay | Admitting: Family Medicine

## 2020-07-29 ENCOUNTER — Other Ambulatory Visit: Payer: Self-pay

## 2020-07-29 ENCOUNTER — Encounter: Payer: Self-pay | Admitting: Family Medicine

## 2020-07-29 ENCOUNTER — Ambulatory Visit (INDEPENDENT_AMBULATORY_CARE_PROVIDER_SITE_OTHER): Payer: 59 | Admitting: Family Medicine

## 2020-07-29 VITALS — BP 132/84 | HR 69 | Temp 98.3°F | Wt 223.4 lb

## 2020-07-29 DIAGNOSIS — M791 Myalgia, unspecified site: Secondary | ICD-10-CM

## 2020-07-29 DIAGNOSIS — R5383 Other fatigue: Secondary | ICD-10-CM | POA: Diagnosis not present

## 2020-07-29 DIAGNOSIS — R0683 Snoring: Secondary | ICD-10-CM | POA: Diagnosis not present

## 2020-07-29 NOTE — Patient Instructions (Signed)
Muscle Pain, Adult Muscle pain, also called myalgia, is a condition in which a person has pain in one or more muscles in the body. Muscle pain may be mild, moderate, or severe. It may feel sharp, achy, or burning. In most cases, the pain lasts only a short time and goes away without treatment. Muscle pain can result from using muscles in a new or different way or after a period of inactivity. It is normal to feel some muscle pain after starting an exercise program. Muscles that have not been used often will be sore at first. What are the causes? This condition is caused by using muscles in a new or different way after a period of inactivity. Other causes may include:  Overuse or muscle strain, especially if you are not in shape. This is the most common cause of muscle pain.  Injury or bruising.  Infectious diseases, including diseases caused by viruses, such as the flu (influenza).  Fibromyalgia.This is a long-term, or chronic, condition that causes muscle tenderness, tiredness (fatigue), and headache.  Autoimmune or rheumatologic diseases. These are conditions, such as lupus, in which the body's defense system (immunesystem) attacks areas in the body.  Certain medicines, including ACE inhibitors and statins. What are the signs or symptoms? The main symptom of this condition is sore or painful muscles, including during activity and when stretching. You may also have slight swelling. How is this diagnosed? This condition is diagnosed with a physical exam. Your health care provider will ask questions about your pain and when it began. If you have not had muscle pain for very long, your health care provider may want to wait before doing much testing. If your muscle pain has lasted a long time, tests may be done right away. In some cases, this may include tests to rule out certain conditions or illnesses. How is this treated? Treatment for this condition depends on the cause. Home care is often  enough to relieve muscle pain. Your health care provider may also prescribe NSAIDs, such as ibuprofen. Follow these instructions at home: Medicines  Take over-the-counter and prescription medicines only as told by your health care provider.  Ask your health care provider if the medicine prescribed to you requires you to avoid driving or using machinery. Managing pain, swelling, and discomfort  If directed, put ice on the painful area. To do this: ? Put ice in a plastic bag. ? Place a towel between your skin and the bag. ? Leave the ice on for 20 minutes, 2-3 times a day.  For the first 2 days of muscle soreness, or if there is swelling: ? Do not soak in hot baths. ? Do not use a hot tub, steam room, sauna, heating pad, or other heat source.  After 48-72 hours, you may alternate between applying ice and applying heat as told by your health care provider. If directed, apply heat to the affected area as often as told by your health care provider. Use the heat source that your health care provider recommends, such as a moist heat pack or a heating pad. ? Place a towel between your skin and the heat source. ? Leave the heat on for 20-30 minutes. ? Remove the heat if your skin turns bright red. This is especially important if you are unable to feel pain, heat, or cold. You may have a greater risk of getting burned.  If you have an injury, raise (elevate) the injured area above the level of your heart while you  are sitting or lying down.      Activity  If overuse is causing your muscle pain: ? Slow down your activities until the pain goes away. ? Do regular, gentle exercises if you are not usually active. ? Warm up before exercising. Stretch before and after exercising. This can help lower the risk of muscle pain.  Do not continue working out if the pain is severe. Severe pain could mean that you have injured a muscle.  Do not lift anything that is heavier than 5-10 lb (2.3-4.5 kg), or  the limit that you are told, until your health care provider says that it is safe.  Return to your normal activities as told by your health care provider. Ask your health care provider what activities are safe for you.   General instructions  Do not use any products that contain nicotine or tobacco, such as cigarettes, e-cigarettes, and chewing tobacco. These can delay healing. If you need help quitting, ask your health care provider.  Keep all follow-up visits as told by your health care provider. This is important. Contact a health care provider if you have:  Muscle pain that gets worse and medicines do not help.  Muscle pain that lasts longer than 3 days.  A rash or fever along with muscle pain.  Muscle pain after a tick bite.  Muscle pain while working out, even though you are in good physical condition.  Redness, soreness, or swelling along with muscle pain.  Muscle pain after starting a new medicine or changing the dose of a medicine. Get help right away if you have:  Trouble breathing.  Trouble swallowing.  Muscle pain along with a stiff neck, fever, and vomiting.  Severe muscle weakness or you cannot move part of your body. These symptoms may represent a serious problem that is an emergency. Do not wait to see if the symptoms will go away. Get medical help right away. Call your local emergency services (911 in the [place] Do not drive yourself to the hospital. Summary  Muscle pain usually lasts only a short time and goes away without treatment.  This condition is caused by using muscles in a new or different way after a period of inactivity.  If your muscle pain lasts longer than 3 days, tell your health care provider. This information is not intended to replace advice given to you by your health care provider. Make sure you discuss any questions you have with your health care provider. Document Revised: 02/07/2019 Document Reviewed: 02/07/2019 Elsevier Patient  Education  2021 [place].  Fatigue If you have fatigue, you feel tired all the time and have a lack of energy or a lack of motivation. Fatigue may make it difficult to start or complete tasks because of exhaustion. In general, occasional or mild fatigue is often a normal response to activity or life. However, long-lasting (chronic) or extreme fatigue may be a symptom of a medical condition. Follow these instructions at home: General instructions  Watch your fatigue for any changes.  Go to bed and get up at the same time every day.  Avoid fatigue by pacing yourself during the day and getting enough sleep at night.  Maintain a healthy weight. Medicines  Take over-the-counter and prescription medicines only as told by your health care provider.  Take a multivitamin, if told by your health care provider.  Do not use herbal or dietary supplements unless they are approved by your health care provider. Activity  Exercise regularly, as told  by your health care provider.  Use or practice techniques to help you relax, such as yoga, tai chi, meditation, or massage therapy.   Eating and drinking  Avoid heavy meals in the evening.  Eat a well-balanced diet, which includes lean proteins, whole grains, plenty of fruits and vegetables, and low-fat dairy products.  Avoid consuming too much caffeine.  Avoid the use of alcohol.  Drink enough fluid to keep your urine pale yellow.   Lifestyle  Change situations that cause you stress. Try to keep your work and personal schedule in balance.  Do not use any products that contain nicotine or tobacco, such as cigarettes and e-cigarettes. If you need help quitting, ask your health care provider.  Do not use drugs. Contact a health care provider if:  Your fatigue does not get better.  You have a fever.  You suddenly lose or gain weight.  You have headaches.  You have trouble falling asleep or sleeping through the night.  You feel  angry, guilty, anxious, or sad.  You are unable to have a bowel movement (constipation).  Your skin is dry.  You have swelling in your legs or another part of your body. Get help right away if:  You feel confused.  Your vision is blurry.  You feel faint or you pass out.  You have a severe headache.  You have severe pain in your abdomen, your back, or the area between your waist and hips (pelvis).  You have chest pain, shortness of breath, or an irregular or fast heartbeat.  You are unable to urinate, or you urinate less than normal.  You have abnormal bleeding, such as bleeding from the rectum, vagina, nose, lungs, or nipples.  You vomit blood.  You have thoughts about hurting yourself or others. If you ever feel like you may hurt yourself or others, or have thoughts about taking your own life, get help right away. You can go to your nearest emergency department or call:  Your local emergency services (911 in the [place]  A suicide crisis helpline, such as the [employer] at (984)701-0071. This is open 24 hours a day. Summary  If you have fatigue, you feel tired all the time and have a lack of energy or a lack of motivation.  Fatigue may make it difficult to start or complete tasks because of exhaustion.  Long-lasting (chronic) or extreme fatigue may be a symptom of a medical condition.  Exercise regularly, as told by your health care provider.  Change situations that cause you stress. Try to keep your work and personal schedule in balance. This information is not intended to replace advice given to you by your health care provider. Make sure you discuss any questions you have with your health care provider. Document Revised: 11/20/2018 Document Reviewed: 01/24/2017 Elsevier Patient Education  2021 [employer].

## 2020-07-29 NOTE — Progress Notes (Signed)
Subjective:    Patient ID: Virgina [name], female    DOB: Jan 13, 1961, 60 y.o.   MRN: [id number]  Chief Complaint  Patient presents with  . Fatigue    HPI Patient was seen today for f/u. Pt notes fatigue x a few wks.  Pt endorses snoring, feeing un-rested in am.  Pt started taking iron and vitamin b12 last wk hoping it would help.  Pt notes sinus issues, frontal HA, frontal pressure, and dysphagia last wk.  Pt tried Allegra and sudafed.  Pt notes some stress as she is caring for her mother.  Pt has a paternal aunt and her son who have lupus.  Pt also with muscle aches.  Past Medical History:  Diagnosis Date  . Allergy   . GERD (gastroesophageal reflux disease)    "does not need meds anymore"  . Heart murmur   . Hyperlipidemia   . Lactose intolerance   . TTP (thrombotic thrombocytopenic purpura)     Allergies  Allergen Reactions  . Eggs Or Egg-Derived Products Nausea And Vomiting  . Lactose Intolerance (Gi) Other (See Comments)    vomiting    ROS General: Denies fever, chills, night sweats, changes in weight, changes in appetite.  +fatigue HEENT: Denies ear pain, changes in vision, rhinorrhea, sore throat  +dysphagia, HA, sinus pressure CV: Denies CP, palpitations, SOB, orthopnea Pulm: Denies SOB, cough, wheezing GI: Denies abdominal pain, nausea, vomiting, diarrhea, constipation GU: Denies dysuria, hematuria, frequency, vaginal discharge Msk: Denies muscle cramps, joint pains Neuro: Denies weakness, numbness, tingling Skin: Denies rashes, bruising Psych: Denies depression, anxiety, hallucinations    Objective:    Blood pressure 132/84, pulse 69, temperature 98.3 F (36.8 C), temperature source Oral, weight 223 lb 6.4 oz (101.3 kg), SpO2 95 %.   Gen. Pleasant, well-nourished, in no distress, normal affect   HEENT: [place]/AT, face symmetric, conjunctiva clear, no scleral icterus, PERRLA, EOMI, nares patent without drainage, pharynx without erythema or exudate.  Lungs:  no accessory muscle use, CTAB, no wheezes or rales Cardiovascular: RRR, no m/r/g, no peripheral edema Abdomen: BS present, soft, NT/ND, no hepatosplenomegaly. Musculoskeletal: No deformities, no cyanosis or clubbing, normal tone Neuro:  A&Ox3, CN II-XII intact, normal gait Skin:  Warm, no lesions/ rash   Wt Readings from Last 3 Encounters:  07/29/20 223 lb 6.4 oz (101.3 kg)  07/13/20 222 lb 9.6 oz (101 kg)  06/09/20 222 lb 6.4 oz (100.9 kg)    Lab Results  Component Value Date   WBC 6.8 06/09/2020   HGB 11.5 (L) 06/09/2020   HCT 35.2 (L) 06/09/2020   PLT 393.0 06/09/2020   GLUCOSE 78 06/09/2020   CHOL 198 06/09/2020   TRIG 65.0 06/09/2020   HDL 46.80 06/09/2020   LDLDIRECT 150.4 10/04/2012   LDLCALC 138 (H) 06/09/2020   ALT 14 12/27/2018   AST 15 12/27/2018   NA 139 06/09/2020   K 4.1 06/09/2020   CL 104 06/09/2020   CREATININE 0.77 06/09/2020   BUN 11 06/09/2020   CO2 29 06/09/2020   TSH 1.10 06/09/2020   INR 1.1 11/27/2019   HGBA1C 6.2 06/09/2020    Assessment/Plan:  Other fatigue  -discussed possible causes including anemia, OSA - Plan: CBC with Differential/Platelet, IBC Panel([facility]), C-reactive Protein, Sedimentation Rate  Myalgia  - Plan: CBC with Differential/Platelet, Lupus (SLE) Analysis  Snoring  -supportive care including weight loss. - Plan: PSG Sleep Study, CBC with Differential/Platelet  F/u prn  Grier Mitts, MD

## 2020-07-30 ENCOUNTER — Encounter: Payer: Self-pay | Admitting: Family Medicine

## 2020-07-30 LAB — IBC PANEL
Iron: 52 ug/dL (ref 42–145)
Saturation Ratios: 15.1 % — ABNORMAL LOW (ref 20.0–50.0)
Transferrin: 246 mg/dL (ref 212.0–360.0)

## 2020-07-30 LAB — CBC WITH DIFFERENTIAL/PLATELET
Basophils Absolute: 0.1 10*3/uL (ref 0.0–0.1)
Basophils Relative: 0.9 % (ref 0.0–3.0)
Eosinophils Absolute: 0.3 10*3/uL (ref 0.0–0.7)
Eosinophils Relative: 3.3 % (ref 0.0–5.0)
HCT: 35.5 % — ABNORMAL LOW (ref 36.0–46.0)
Hemoglobin: 11.7 g/dL — ABNORMAL LOW (ref 12.0–15.0)
Lymphocytes Relative: 28.8 % (ref 12.0–46.0)
Lymphs Abs: 2.4 10*3/uL (ref 0.7–4.0)
MCHC: 33.1 g/dL (ref 30.0–36.0)
MCV: 87.7 fl (ref 78.0–100.0)
Monocytes Absolute: 0.7 10*3/uL (ref 0.1–1.0)
Monocytes Relative: 8.7 % (ref 3.0–12.0)
Neutro Abs: 5 10*3/uL (ref 1.4–7.7)
Neutrophils Relative %: 58.3 % (ref 43.0–77.0)
Platelets: 410 10*3/uL — ABNORMAL HIGH (ref 150.0–400.0)
RBC: 4.05 Mil/uL (ref 3.87–5.11)
RDW: 14.1 % (ref 11.5–15.5)
WBC: 8.5 10*3/uL (ref 4.0–10.5)

## 2020-07-30 LAB — C-REACTIVE PROTEIN: CRP: <1 mg/dL (ref 0.5–20.0)

## 2020-07-30 LAB — SEDIMENTATION RATE: Sed Rate: 21 mm/hr (ref 0–30)

## 2020-08-02 ENCOUNTER — Ambulatory Visit: Payer: 59 | Admitting: Podiatry

## 2020-08-02 ENCOUNTER — Other Ambulatory Visit: Payer: Self-pay | Admitting: Family Medicine

## 2020-08-02 DIAGNOSIS — R768 Other specified abnormal immunological findings in serum: Secondary | ICD-10-CM

## 2020-08-03 LAB — LUPUS (SLE) ANALYSIS
Anti Nuclear Antibody (ANA): POSITIVE — AB
Complement C4, Serum: 24 mg/dL (ref 12–38)
ENA RNP Ab: 0.3 AI (ref 0.0–0.9)
ENA SM Ab Ser-aCnc: <0.2 AI (ref 0.0–0.9)
ENA SSA (RO) Ab: <0.2 AI (ref 0.0–0.9)
ENA SSB (LA) Ab: <0.2 AI (ref 0.0–0.9)
Mitochondrial Ab: <20 Units (ref 0.0–20.0)
Parietal Cell Ab: 15.7 Units (ref 0.0–20.0)
Scleroderma (Scl-70) (ENA) Antibody, IgG: 1.3 AI — ABNORMAL HIGH (ref 0.0–0.9)
Smooth Muscle Ab: 13 Units (ref 0–19)
Thyroperoxidase Ab SerPl-aCnc: 9 IU/mL (ref 0–34)
dsDNA Ab: 2 IU/mL (ref 0–9)

## 2020-08-03 NOTE — Progress Notes (Signed)
Results viewed on MyChart. 

## 2020-08-12 ENCOUNTER — Encounter: Payer: Self-pay | Admitting: Podiatry

## 2020-08-12 ENCOUNTER — Other Ambulatory Visit: Payer: Self-pay

## 2020-08-12 ENCOUNTER — Ambulatory Visit (INDEPENDENT_AMBULATORY_CARE_PROVIDER_SITE_OTHER): Payer: 59 | Admitting: Podiatry

## 2020-08-12 DIAGNOSIS — L600 Ingrowing nail: Secondary | ICD-10-CM | POA: Diagnosis not present

## 2020-08-12 NOTE — Patient Instructions (Signed)

## 2020-08-12 NOTE — Progress Notes (Signed)
Subjective:   Patient ID: Virgina [name], female   DOB: 60 y.o.   MRN: [id number]   HPI Patient states the nail has regrown not as thick as before but painful and I need to have it removed   ROS      Objective:  Physical Exam  Neurovascular status intact with patient found to have a damaged left hallux nail that unfortunately has regrown is thickened not as bad as previously but present     Assessment:  Ingrown damaged left hallux nail with pain     Plan:  Discussed correction at this point and did explain if this does not get better she may have a immunity to the acid it may require some form of stitching procedure.  At this point I explained procedure risk and she signed consent form and I infiltrated the left hallux 60 mg like Marcaine mixture sterile prep done and using sterile instrumentation remove the hallux nail exposed matrix applied phenol 5 applications 30 seconds and then applied sterile dressing.  Patient does understand this is a permanent procedure prior to the initiation of the procedure.  Patient will leave dressing on 24 hours take it off earlier if any throbbing were to occur and encouraged to call with questions concerns

## 2020-08-17 ENCOUNTER — Encounter: Payer: Self-pay | Admitting: Family Medicine

## 2020-09-06 NOTE — Progress Notes (Signed)
Office Visit Note  Patient: Deanna [name]             Date of Birth: [birth year]           MRN: [id number]             PCP: Billie Ruddy, MD Referring: Billie Ruddy, MD Visit Date: 09/07/2020  Subjective:  New Patient (Initial Visit) (Patient complains of bilateral hand pain, bilateral ankle pain and swelling, and morning stiffness in bilateral feet. )   History of Present Illness: Deanna [name] is a 60 y.o. female here for evaluation of positive ANA with fatigue and myalgias. Symptoms are persistent since last year with fatigue, pains in her hands and ankles and stiffness but she has had some joint pains going back for years. She typically notices swelling at the ankles by the end of the day. This is moderately painful but not debilitating. Her hand pain is worst with use during the day and she notices swelling when eating a lot of salt. 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 She does experience raynaud's symptoms with pallor in fingertips without any lesions ulcers or injuries. She denies problems with mouth and eye dryness, lymphadenopathy, chest pain, skin rashes, or photosensitive  Labs reviewed ANA pos Scl-70 1.3  Activities of Daily Living:  Patient reports morning stiffness for 2-3 minutes.   Patient Denies nocturnal pain.  Difficulty dressing/grooming: Denies Difficulty climbing stairs: Reports Difficulty getting out of chair: Denies Difficulty using hands for taps, buttons, cutlery, and/or writing: Reports  Review of Systems  Constitutional: Positive for fatigue.  HENT: Negative for mouth sores, mouth dryness and nose dryness.   Eyes: Positive for visual disturbance. Negative for pain, itching and dryness.  Respiratory: Negative for cough, hemoptysis, shortness of breath and difficulty breathing.   Cardiovascular: Positive for swelling in legs/feet. Negative for chest pain and  palpitations.  Gastrointestinal: Negative for abdominal pain, blood in stool, constipation and diarrhea.  Endocrine: Negative for increased urination.  Genitourinary: Negative for painful urination.  Musculoskeletal: Positive for arthralgias, joint pain, joint swelling and morning stiffness. Negative for myalgias, muscle weakness, muscle tenderness and myalgias.  Skin: Positive for color change, rash and sensitivity to sunlight. Negative for redness.  Allergic/Immunologic: Positive for susceptible to infections.  Neurological: Positive for dizziness, numbness and headaches. Negative for memory loss and weakness.  Hematological: Negative for swollen glands.  Psychiatric/Behavioral: Positive for sleep disturbance. Negative for confusion.    PMFS History:  Patient Active Problem List   Diagnosis Date Noted  . Positive ANA (antinuclear antibody) 09/07/2020  . Raynaud's phenomenon 09/07/2020  . Onychomycosis 07/25/2019  . Varicose veins of both legs with edema 07/25/2019  . H. pylori infection 12/21/2016  . Anemia 12/12/2016  . Abnormal brain MRI 09/25/2016  . Numbness and tingling in left hand 09/25/2016  . Cervical radiculopathy at C5 09/25/2016  . Neck pain 09/25/2016  . 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 Aortic root dilatation ([place]) 08/07/2016  . Ear pain 07/22/2016  . Hyperlipemia 12/02/2015  . H/O splenectomy 12/02/2015  . Hyperglycemia 01/27/2014  . Obesity 01/27/2014  . Vitamin D deficiency 09/11/2012    Past Medical History:  Diagnosis Date  . Allergy   . GERD (gastroesophageal reflux disease)    "does not need meds anymore"  . Heart murmur   . Hyperlipidemia   . Lactose intolerance   . TTP (thrombotic thrombocytopenic purpura)  Family History  Problem Relation Age of Onset  . Arthritis Mother   . Hyperlipidemia Mother   . Stroke Mother   . Hypertension Mother   . Hypertension Father   . Lupus Son   . Breast cancer Daughter   . Lupus Maternal Aunt   .  Colon cancer Neg Hx    Past Surgical History:  Procedure Laterality Date  . ABDOMINAL HYSTERECTOMY  2006   benign tumor, complete  . KNEE ARTHROSCOPY Right   . NASAL SEPTUM SURGERY    . 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 Meningococcal B, OMV 12/02/2015  . PFIZER Comirnaty([place])Covid-19 Tri-Sucrose Vaccine 07/19/2019, 08/09/2019  . PPD Test 01/21/2018  . Pneumococcal Polysaccharide-23 10/14/2010, 10/21/2014     Objective: Vital Signs: BP 139/82 (BP Location: Right Arm, Patient Position: Sitting, Cuff Size: Large)   Pulse 65   Ht 5' 7.5" (1.715 m)   Wt 221 lb 6.4 oz (100.4 kg)   BMI 34.16 kg/m    Physical Exam Constitutional:      Appearance: She is obese.  HENT:     Right Ear: External ear normal.     Left Ear: External ear normal.     Mouth/Throat:     Mouth: Mucous membranes are moist.     Pharynx: Oropharynx is clear.  Eyes:     Conjunctiva/sclera: Conjunctivae normal.  Cardiovascular:     Rate and Rhythm: Normal rate and regular rhythm.  Pulmonary:     Effort: Pulmonary effort is normal.     Breath sounds: Normal breath sounds.  Skin:    General: Skin is warm and dry.     Findings: No rash.     Comments: Few tortuous nailfold capillaries Numerous superficial tortuous veins at ankles and distal shins  Neurological:     General: No focal deficit present.     Mental Status: She is alert.     Deep Tendon Reflexes: Reflexes normal.  Psychiatric:        Mood and Affect: Mood normal.    Musculoskeletal Exam:  Neck full ROM no tenderness Shoulders full ROM no tenderness or  swelling Elbows full ROM no tenderness or swelling Wrists full ROM no tenderness or swelling Fingers full ROM no tenderness or swelling Knees full ROM no tenderness or swelling Ankles full ROM no tenderness or swelling MTPs full ROM no tenderness or swelling  Investigation: No additional findings.  Imaging: No results found.  Recent Labs: Lab Results  Component Value Date   WBC 8.5 07/29/2020   HGB 11.7 (L) 07/29/2020   PLT 410.0 (H) 07/29/2020   NA 139 06/09/2020   K 4.1 06/09/2020   CL 104 06/09/2020   CO2 29 06/09/2020   GLUCOSE 78 06/09/2020   BUN 11 06/09/2020   CREATININE 0.77 06/09/2020   BILITOT 0.7 12/27/2018   ALKPHOS 80 12/27/2018   AST 15 12/27/2018   ALT 14 12/27/2018   PROT 7.1 12/27/2018   ALBUMIN 4.1 12/27/2018  CALCIUM 9.6 06/09/2020   GFRAA >60 11/27/2019   QFTBGOLDPLUS NEGATIVE 01/17/2018    Speciality Comments: No specialty comments available.  Procedures:  No procedures performed Allergies: Eggs or egg-derived products and Lactose intolerance (gi)   Assessment / Plan:     Visit Diagnoses: Positive ANA (antinuclear antibody) - Plan: Beta-2 glycoprotein antibodies, Cardiolipin antibodies, IgG, IgM, IgA, ANA, CK, Urinalysis, Routine w reflex microscopic  Positive ANA Abs without clinical features of systemic lupus on examination today. She has raynaud's phenomenon and history of TTP and aortic root disease. Low positive Scl-70 Ab titer could be associated with systemic sclerosis with these features but less likely without sclerodactyly or other cutaneous changes. Checking ANA for titer and pattern also checking APS Abs and urinalysis for any evidence of nephritis.  Aortic root dilatation ([facility])  Not apparently hemodynamically significant by last studies, not clear if related.  Vitamin D deficiency - Plan: VITAMIN D 25 Hydroxy (Vit-D Deficiency, Fractures)  Suspected low vitamin D with symptoms and associated issues will check level today and  whether replacement needed.  Varicose veins of both legs with edema  Swelling around the ankles appears to be venous varicosities and possible insufficiency does not look like joint inflammation at this time.  Raynaud's phenomenon without gangrene  Few tortuous capillaries, digital pallor, no gangrenous injuries. Checking APS Abs. Currently on antiplatelet therapy.  Orders: Orders Placed This Encounter  Procedures  . Beta-2 glycoprotein antibodies  . Cardiolipin antibodies, IgG, IgM, IgA  . ANA  . CK  . VITAMIN D 25 Hydroxy (Vit-D Deficiency, Fractures)  . Urinalysis, Routine w reflex microscopic   No orders of the defined types were placed in this encounter.   Follow-Up Instructions: No follow-ups on file.   Collier Salina, MD  Note - This record has been created using [employer].  Chart creation errors have been sought, but may not always  have been located. Such creation errors do not reflect on  the standard of medical care.

## 2020-09-07 ENCOUNTER — Other Ambulatory Visit: Payer: Self-pay

## 2020-09-07 ENCOUNTER — Encounter: Payer: Self-pay | Admitting: Internal Medicine

## 2020-09-07 ENCOUNTER — Ambulatory Visit (INDEPENDENT_AMBULATORY_CARE_PROVIDER_SITE_OTHER): Payer: 59 | Admitting: Internal Medicine

## 2020-09-07 VITALS — BP 139/82 | HR 65 | Ht 67.5 in | Wt 221.4 lb

## 2020-09-07 DIAGNOSIS — I83893 Varicose veins of bilateral lower extremities with other complications: Secondary | ICD-10-CM | POA: Diagnosis not present

## 2020-09-07 DIAGNOSIS — E559 Vitamin D deficiency, unspecified: Secondary | ICD-10-CM

## 2020-09-07 DIAGNOSIS — R768 Other specified abnormal immunological findings in serum: Secondary | ICD-10-CM

## 2020-09-07 DIAGNOSIS — I7300 Raynaud's syndrome without gangrene: Secondary | ICD-10-CM | POA: Insufficient documentation

## 2020-09-07 DIAGNOSIS — I77810 Thoracic aortic ectasia: Secondary | ICD-10-CM | POA: Diagnosis not present

## 2020-09-07 DIAGNOSIS — M359 Systemic involvement of connective tissue, unspecified: Secondary | ICD-10-CM | POA: Insufficient documentation

## 2020-09-07 NOTE — Patient Instructions (Signed)
I am checking several lab tests including a repeat ANA for concentration and pattern, checking antibodies associated with lupus and abnormal clotting problems, CK for evidence of muscle injury, urinalysis for any evidence of kidney inflammation or abnormal urine in the protein, and vitamin D level deficiency is extremely common amongst individuals with inflammatory conditions and can increase symptoms and fatigue.   I do not currently see evidence of inflammation to indicate lupus or related disease. Your results will be available on MyChart and we will also contact you once all have resulted.

## 2020-09-08 LAB — URINALYSIS, ROUTINE W REFLEX MICROSCOPIC
Bacteria, UA: NONE SEEN /HPF
Bilirubin Urine: NEGATIVE
Glucose, UA: NEGATIVE
Hyaline Cast: NONE SEEN /LPF
Ketones, ur: NEGATIVE
Leukocytes,Ua: NEGATIVE
Nitrite: NEGATIVE
Protein, ur: NEGATIVE
Specific Gravity, Urine: 1.018 (ref 1.001–1.035)
Squamous Epithelial / HPF: NONE SEEN /HPF (ref <=5)
WBC, UA: NONE SEEN /HPF (ref 0–5)
pH: 7 (ref 5.0–8.0)

## 2020-09-08 LAB — VITAMIN D 25 HYDROXY (VIT D DEFICIENCY, FRACTURES): Vit D, 25-Hydroxy: 9 ng/mL — ABNORMAL LOW (ref 30–100)

## 2020-09-08 LAB — BETA-2 GLYCOPROTEIN ANTIBODIES
Beta-2 Glyco 1 IgA: <2 U/mL
Beta-2 Glyco 1 IgM: <2 U/mL
Beta-2 Glyco I IgG: <2 U/mL

## 2020-09-08 LAB — CARDIOLIPIN ANTIBODIES, IGG, IGM, IGA
Anticardiolipin IgA: <2 APL-U/mL
Anticardiolipin IgG: <2 GPL-U/mL
Anticardiolipin IgM: <2 MPL-U/mL

## 2020-09-08 LAB — ANA: Anti Nuclear Antibody (ANA): NEGATIVE

## 2020-09-08 LAB — CK: Total CK: 209 U/L — ABNORMAL HIGH (ref 29–143)

## 2020-09-08 LAB — MICROSCOPIC MESSAGE

## 2020-09-17 ENCOUNTER — Telehealth: Payer: Self-pay

## 2020-09-17 MED ORDER — PREDNISONE 10 MG PO TABS: 10.0000 mg | ORAL_TABLET | Freq: Every day | ORAL | 0 refills | Status: DC

## 2020-09-17 NOTE — Telephone Encounter (Signed)
I spoke with Ms. Harries her labs show very low vitamin D I recommended she start supplementing with 2000-4000 units daily. ANA test is now negative which is unusual since previously positive without interval treatments. Also shows mildly elevated CK I do not have a prior baseline this could be normal variation or could reflect inflammation. Recommend starting prednisone 10 mg daily to see if symptoms response significantly and to recheck labs at f/u in about 2 weeks. Rx sent to pharmacy today. Urinalysis showed 1+ Hgb and few RBCs we can also recheck this in f/u. She plans to call clinic on Monday to schedule a return later this month approximately 2 weeks.

## 2020-09-17 NOTE — Telephone Encounter (Signed)
Patient left a voicemail stating she had an appointment on 09/07/20 with Dr. Benjamine Mola and hasn't received a call with her labwork results.  Patient requested a return call.

## 2020-09-30 ENCOUNTER — Encounter (HOSPITAL_BASED_OUTPATIENT_CLINIC_OR_DEPARTMENT_OTHER): Payer: 59 | Admitting: Internal Medicine

## 2020-10-10 ENCOUNTER — Emergency Department (HOSPITAL_BASED_OUTPATIENT_CLINIC_OR_DEPARTMENT_OTHER)
Admission: EM | Admit: 2020-10-10 | Discharge: 2020-10-10 | Disposition: A | Discharge status: Home / Self Care | Payer: 59 | Attending: Emergency Medicine | Admitting: Emergency Medicine

## 2020-10-10 ENCOUNTER — Other Ambulatory Visit: Payer: Self-pay

## 2020-10-10 ENCOUNTER — Encounter (HOSPITAL_BASED_OUTPATIENT_CLINIC_OR_DEPARTMENT_OTHER): Payer: Self-pay | Admitting: *Deleted

## 2020-10-10 ENCOUNTER — Emergency Department (HOSPITAL_BASED_OUTPATIENT_CLINIC_OR_DEPARTMENT_OTHER): Payer: 59

## 2020-10-10 ENCOUNTER — Emergency Department (HOSPITAL_COMMUNITY): Payer: 59

## 2020-10-10 DIAGNOSIS — I10 Essential (primary) hypertension: Secondary | ICD-10-CM | POA: Insufficient documentation

## 2020-10-10 DIAGNOSIS — Z7902 Long term (current) use of antithrombotics/antiplatelets: Secondary | ICD-10-CM | POA: Insufficient documentation

## 2020-10-10 DIAGNOSIS — X58XXXA Exposure to other specified factors, initial encounter: Secondary | ICD-10-CM | POA: Diagnosis not present

## 2020-10-10 DIAGNOSIS — R2689 Other abnormalities of gait and mobility: Secondary | ICD-10-CM

## 2020-10-10 DIAGNOSIS — S301XXA Contusion of abdominal wall, initial encounter: Secondary | ICD-10-CM | POA: Diagnosis not present

## 2020-10-10 DIAGNOSIS — R42 Dizziness and giddiness: Secondary | ICD-10-CM

## 2020-10-10 LAB — COMPREHENSIVE METABOLIC PANEL
ALT: 16 U/L (ref 0–44)
AST: 14 U/L — ABNORMAL LOW (ref 15–41)
Albumin: 3.9 g/dL (ref 3.5–5.0)
Alkaline Phosphatase: 71 U/L (ref 38–126)
Anion gap: 6 (ref 5–15)
BUN: 13 mg/dL (ref 6–20)
CO2: 27 mmol/L (ref 22–32)
Calcium: 9.1 mg/dL (ref 8.9–10.3)
Chloride: 106 mmol/L (ref 98–111)
Creatinine, Ser: 0.91 mg/dL (ref 0.44–1.00)
GFR, Estimated: >60 mL/min (ref >60)
Glucose, Bld: 87 mg/dL (ref 70–99)
Potassium: 3.8 mmol/L (ref 3.5–5.1)
Sodium: 139 mmol/L (ref 135–145)
Total Bilirubin: 0.7 mg/dL (ref 0.3–1.2)
Total Protein: 8.1 g/dL (ref 6.5–8.1)

## 2020-10-10 LAB — URINALYSIS, MICROSCOPIC (REFLEX)

## 2020-10-10 LAB — URINALYSIS, ROUTINE W REFLEX MICROSCOPIC
Bilirubin Urine: NEGATIVE
Glucose, UA: NEGATIVE mg/dL
Ketones, ur: NEGATIVE mg/dL
Leukocytes,Ua: NEGATIVE
Nitrite: NEGATIVE
Protein, ur: NEGATIVE mg/dL
Specific Gravity, Urine: 1.01 (ref 1.005–1.030)
pH: 6.5 (ref 5.0–8.0)

## 2020-10-10 LAB — CBC WITH DIFFERENTIAL/PLATELET
Abs Immature Granulocytes: 0.02 10*3/uL (ref 0.00–0.07)
Basophils Absolute: 0.1 10*3/uL (ref 0.0–0.1)
Basophils Relative: 1 %
Eosinophils Absolute: 0.1 10*3/uL (ref 0.0–0.5)
Eosinophils Relative: 1 %
HCT: 40 % (ref 36.0–46.0)
Hemoglobin: 12.8 g/dL (ref 12.0–15.0)
Immature Granulocytes: 0 %
Lymphocytes Relative: 9 %
Lymphs Abs: 0.9 10*3/uL (ref 0.7–4.0)
MCH: 28.8 pg (ref 26.0–34.0)
MCHC: 32 g/dL (ref 30.0–36.0)
MCV: 90.1 fL (ref 80.0–100.0)
Monocytes Absolute: 0.4 10*3/uL (ref 0.1–1.0)
Monocytes Relative: 5 %
Neutro Abs: 7.8 10*3/uL — ABNORMAL HIGH (ref 1.7–7.7)
Neutrophils Relative %: 84 %
Platelets: 477 10*3/uL — ABNORMAL HIGH (ref 150–400)
RBC: 4.44 MIL/uL (ref 3.87–5.11)
RDW: 14.4 % (ref 11.5–15.5)
WBC: 9.2 10*3/uL (ref 4.0–10.5)
nRBC: 0 % (ref 0.0–0.2)

## 2020-10-10 LAB — PROTIME-INR
INR: 1 (ref 0.8–1.2)
Prothrombin Time: 13.6 seconds (ref 11.4–15.2)

## 2020-10-10 LAB — APTT: aPTT: 31 seconds (ref 24–36)

## 2020-10-10 MED ORDER — SODIUM CHLORIDE 0.9 % IV BOLUS
1000.0000 mL | Freq: Once | INTRAVENOUS | Status: AC
  Administered 2020-10-10: 1000 mL via INTRAVENOUS

## 2020-10-10 MED ORDER — LORAZEPAM 2 MG/ML IJ SOLN: 1.0000 mg | INTRAMUSCULAR | Status: DC | PRN

## 2020-10-10 NOTE — Discharge Instructions (Addendum)
Call your neurologist office tomorrow to set up an appointment for outpatient follow-up return to [place] with any concerning changes

## 2020-10-10 NOTE — ED Notes (Addendum)
Transfer from [facility] awaiting MRI.  Denies any symptoms at present.  States she is claustrophobic for MRI.

## 2020-10-10 NOTE — ED Triage Notes (Signed)
Presents today with dizziness, weakness in left arm, new onset of having bruising. HA. Onset of HA and dizziness this past tuesday

## 2020-10-10 NOTE — ED Notes (Signed)
Patient transported to CT 

## 2020-10-10 NOTE — ED Notes (Signed)
PHONE REPORT PROVIDED TO Garnette Czech RN AT [facility]

## 2020-10-10 NOTE — ED Notes (Signed)
ED Provider at bedside. 

## 2020-10-10 NOTE — ED Notes (Signed)
PHONE HANDOFF REPORT GIVEN TO [place] - Baron Sane)

## 2020-10-10 NOTE — ED Notes (Signed)
BEDSIDE BEFAST / VAN ASSESSMENT NEGATIVE

## 2020-10-10 NOTE — ED Provider Notes (Signed)
Medical Decision Making: Care of patient assumed from Dr. Billy Fischer at 1600.  Agree with history, physical exam and plan.  See their note for further details.  Briefly, The pt p/w unstable gait, history of stroke.  Also feels sensation of left-sided partial numbness..   Current plan is as follows: My exam has no focal findings other than some slight imbalance with ambulation, she falls off to the right a few times.  MRI  MRI shows old infarct with no acute changes.  Patient is able to ambulate, feels safe for discharge home.  I spoke with our neurologist on-call he recommended outpatient neurology follow-up, they can do vestibular rehab, this may be changing or worsening of her vertigo but she feels as though it safe to be at home.  She wanted to rule out new stroke.  Return precautions discussed   I personally reviewed and interpreted all labs/imaging.      Breck Coons, MD 10/10/20 [phone]

## 2020-10-10 NOTE — ED Provider Notes (Signed)
[facility] EMERGENCY DEPARTMENT Provider Note   CSN: [id number] Arrival date & time: 10/10/20  1013     History Chief Complaint  Patient presents with  . Headache    Deanna [name] is a 60 y.o. female with PMHx HTN, HLD, GERD, stroke currently on Plavix, Raynaud's, and hx of TTP who presents to the ED today with complaints of dizziness/feeling off balanced when walking for the past 3 days. Pt states she feels like she is walking in a straight line however feels like she is veering more towards the right. Pt reports that earlier this week she began having frontal headaches as well. She states that she will get headaches from time to time however this week it seemed more severe. She assumed she was having allergies at first as it felt like it was in her sinuses. She took Aleve with mild relief. She does not currently have a headache. She also mentions having some blurry vision for a time when the headache was most severe. No nausea or vomiting. Pt denies any facial asymmetry, speech changes, worsening weakness, numbness, or any other associated symptoms.   She also complains of bruising that she noticed on her left calf earlier this week. She denies any trauma to the calf. She did not think much of it until she began noticing bruising to her left abdomen earlier today. Denies pain. She denies bleeding gums, heavy vaginal bleeding, or any other source of bleeding.   Per triage report pt mentions left sided arm weakness as well however she states that this has been ongoing for "several months" and is not new. It appears she was seen in the ED last year for similar complaint however it appeared she also had similar complaints with previous neurology visits. MRI Brain showed chronic infarcts vs demyelinating disease in the past and she was started on Plavix at that time. She has also had an MRI C spine which showed nerve impingement.   The history is provided by the patient and  medical records.       Past Medical History:  Diagnosis Date  . Allergy   . GERD (gastroesophageal reflux disease)    "does not need meds anymore"  . Heart murmur   . Hyperlipidemia   . Lactose intolerance   . TTP (thrombotic thrombocytopenic purpura)     Patient Active Problem List   Diagnosis Date Noted  . Positive ANA (antinuclear antibody) 09/07/2020  . Raynaud's phenomenon 09/07/2020  . Onychomycosis 07/25/2019  . Varicose veins of both legs with edema 07/25/2019  . H. pylori infection 12/21/2016  . Anemia 12/12/2016  . Abnormal brain MRI 09/25/2016  . Numbness and tingling in left hand 09/25/2016  . Cervical radiculopathy at C5 09/25/2016  . Neck pain 09/25/2016  . 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 Aortic root dilatation ([place]) 08/07/2016  . Ear pain 07/22/2016  . Hyperlipemia 12/02/2015  . H/O splenectomy 12/02/2015  . Hyperglycemia 01/27/2014  . Obesity 01/27/2014  . Vitamin D deficiency 09/11/2012    Past Surgical History:  Procedure Laterality Date  . ABDOMINAL HYSTERECTOMY  2006   benign tumor, complete  . KNEE ARTHROSCOPY Right   . NASAL SEPTUM SURGERY    . 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     OB History   No obstetric history on file.     Family History  Problem Relation Age of Onset  . Arthritis Mother   . Hyperlipidemia Mother   . Stroke Mother   .  Hypertension Mother   . Hypertension Father   . Lupus Son   . Breast cancer Daughter   . Lupus Maternal Aunt   . Colon cancer Neg Hx     Social History   Tobacco Use  . Smoking status: Never Smoker  . Smokeless tobacco: Never Used  Vaping Use  . Vaping Use: Never used  Substance Use Topics  . Alcohol use: No  . Drug use: No    Home Medications Prior to Admission medications   Medication Sig Start Date End Date Taking? Authorizing Provider  clopidogrel (PLAVIX) 75 MG tablet Take 1 tablet (75 mg total) by mouth daily. 12/09/19   Cameron Sprang, MD  Ferrous Sulfate  (IRON PO) Take by mouth.    [provider]  Naproxen Sodium (ALEVE PO) Take by mouth as needed.    [provider]  predniSONE (DELTASONE) 10 MG tablet Take 1 tablet (10 mg total) by mouth daily with breakfast. 09/17/20   Rice, Resa Miner, MD    Allergies    Eggs or egg-derived products and Lactose intolerance (gi)  Review of Systems   Review of Systems  Constitutional: Negative for chills and fever.  Eyes: Positive for visual disturbance.  Gastrointestinal: Negative for nausea and vomiting.  Neurological: Positive for dizziness and headaches. Negative for syncope and speech difficulty.  Hematological: Bruises/bleeds easily.  All other systems reviewed and are negative.   Physical Exam Updated Vital Signs BP (!) 145/91 (BP Location: Right Arm)   Pulse 65   Temp 98.9 F (37.2 C) (Oral)   Resp 16   Ht 5\' 9"  (1.753 m)   Wt 98 kg   SpO2 100%   BMI 31.90 kg/m   Physical Exam Vitals and nursing note reviewed.  Constitutional:      Appearance: She is not ill-appearing or diaphoretic.  HENT:     Head: Normocephalic and atraumatic.  Eyes:     Extraocular Movements: Extraocular movements intact.     Conjunctiva/sclera: Conjunctivae normal.     Pupils: Pupils are equal, round, and reactive to light.  Cardiovascular:     Rate and Rhythm: Normal rate and regular rhythm.     Pulses: Normal pulses.  Pulmonary:     Effort: Pulmonary effort is normal.     Breath sounds: Normal breath sounds. No wheezing, rhonchi or rales.  Abdominal:     Palpations: Abdomen is soft.     Tenderness: There is no abdominal tenderness. There is no guarding or rebound.  Musculoskeletal:     Cervical back: Normal range of motion and neck supple.  Skin:    General: Skin is warm and dry.     Findings: Bruising present.  Neurological:     Mental Status: She is alert.     Comments: Alert and oriented to self, place, time and event.   Speech is fluent, clear without dysarthria or  dysphasia.   Strength 5/5 in upper/lower extremities  Sensation intact in upper/lower extremities   Positive Romberg. No pronator drift.  Normal finger-to-nose and feet tapping.  CN I not tested  CN II grossly intact visual fields bilaterally. Did not visualize posterior eye.   CN III, IV, VI PERRLA and EOMs intact bilaterally  CN V Intact sensation to sharp and light touch to the face  CN VII facial movements symmetric  CN VIII not tested  CN IX, X no uvula deviation, symmetric rise of soft palate  CN XI 5/5 SCM and trapezius strength bilaterally  CN  XII Midline tongue protrusion, symmetric L/R movements      ED Results / Procedures / Treatments   Labs (all labs ordered are listed, but only abnormal results are displayed) Labs Reviewed  CBC WITH DIFFERENTIAL/PLATELET - Abnormal; Notable for the following components:      Result Value   Platelets 477 (*)    Neutro Abs 7.8 (*)    All other components within normal limits  URINALYSIS, ROUTINE W REFLEX MICROSCOPIC - Abnormal; Notable for the following components:   Color, Urine STRAW (*)    Hgb urine dipstick MODERATE (*)    All other components within normal limits  COMPREHENSIVE METABOLIC PANEL - Abnormal; Notable for the following components:   AST 14 (*)    All other components within normal limits  URINALYSIS, MICROSCOPIC (REFLEX) - Abnormal; Notable for the following components:   Bacteria, UA RARE (*)    All other components within normal limits  PROTIME-INR  APTT    EKG None  Radiology CT Head Wo Contrast  Result Date: 10/10/2020 CLINICAL DATA:  Dizziness.  Weakness in left arm. EXAM: CT HEAD WITHOUT CONTRAST TECHNIQUE: Contiguous axial images were obtained from the base of the skull through the vertex without intravenous contrast. COMPARISON:  November 27, 2019 FINDINGS: Brain: No evidence of acute infarction, hemorrhage, hydrocephalus, extra-axial collection or mass lesion/mass effect. Moderate brain parenchymal  volume loss and deep white matter microangiopathy. Vascular: No hyperdense vessel or unexpected calcification. Skull: Normal. Negative for fracture or focal lesion. Sinuses/Orbits: No acute finding. Other: None. IMPRESSION: 1. No acute intracranial abnormality. 2. Moderate brain parenchymal atrophy and chronic microvascular disease. Electronically Signed   By: Fidela Salisbury M.D.   On: 10/10/2020 11:14    Procedures Procedures   Medications Ordered in ED Medications  sodium chloride 0.9 % bolus 1,000 mL (1,000 mLs Intravenous New Bag/Given 10/10/20 1143)    ED Course  I have reviewed the triage vital signs and the nursing notes.  Pertinent labs & imaging results that were available during my care of the patient were reviewed by me and considered in my medical decision making (see chart for details).    MDM Rules/Calculators/A&P                          60 year old female presents to the ED today with multiple complaints including dizziness/feeling like she is leaning towards 1 side with ambulation, headache, vision changes for the past several days.  Also complains of increased bruising to her left calf and left abdomen without trauma.  She is on Plavix for stroke found on MRI.  Began having a headache 5 days ago, began having dizziness 3 days ago.  Currently out of the window for tPA.  On arrival to the ED today vitals are stable.  Patient is afebrile, nontachycardic and nontachypneic and appears to be in no acute distress.  On my exam she has a positive Romberg however remainder of neuro exam intact and unremarkable.  She is noted to have a bruise to her left calf as well as her left abdomen.  She denies any vaginal bleeding or bleeding of gums.  Does appear she has a history of TTP in the past. Will plan for labs including coags as well as CT head at this time.  CBC without leukocytosis. Hgb stable at 12.8 CMP without electrolyte abnormalities. Creatinine 0.91. LFTs unremarkable.   PTINR and PTT within normal limits  CT Head negative at this time  Given hx of stroke with complaints of feeling offbalanced I do feel pt requires MRI at this time. Discussed case with Dr. Sherry Ruffing at [facility] who agrees to accept patient.   This note was prepared using Dragon voice recognition software and may include unintentional dictation errors due to the inherent limitations of voice recognition software.  Final Clinical Impression(s) / ED Diagnoses Final diagnoses:  Dizziness  Balance problem    Rx / DC Orders ED Discharge Orders    None       Eustaquio Maize, PA-C 10/10/20 1228    Gareth Morgan, MD 10/12/20 1437

## 2020-10-10 NOTE — ED Notes (Signed)
Phone call to [facility] to inform of pts tx from [facility] to [facility] ED for MRI

## 2020-10-13 ENCOUNTER — Ambulatory Visit: Payer: 59 | Admitting: Internal Medicine

## 2020-10-13 ENCOUNTER — Telehealth: Payer: Self-pay | Admitting: Neurology

## 2020-10-13 NOTE — Telephone Encounter (Signed)
ER notes reviewed, she had a normal brain MRI. They said to f/u with Neuro so we can order vestibular therapy for her dizziness. If she is still dizzy, we can just order the vestibular therapy. Thanks

## 2020-10-13 NOTE — Telephone Encounter (Signed)
Pt called no answer left a voice mail to call the office back  °

## 2020-10-14 ENCOUNTER — Other Ambulatory Visit: Payer: Self-pay

## 2020-10-14 DIAGNOSIS — R42 Dizziness and giddiness: Secondary | ICD-10-CM

## 2020-10-14 DIAGNOSIS — R0683 Snoring: Secondary | ICD-10-CM

## 2020-10-14 DIAGNOSIS — J329 Chronic sinusitis, unspecified: Secondary | ICD-10-CM

## 2020-10-14 DIAGNOSIS — Z6832 Body mass index (BMI) 32.0-32.9, adult: Secondary | ICD-10-CM

## 2020-10-14 DIAGNOSIS — E6609 Other obesity due to excess calories: Secondary | ICD-10-CM

## 2020-10-14 NOTE — Telephone Encounter (Signed)
Patient added to wait list

## 2020-10-14 NOTE — Telephone Encounter (Signed)
Referral placed.

## 2020-10-14 NOTE — Telephone Encounter (Signed)
No answer at 250 10/14/2020

## 2020-10-14 NOTE — Telephone Encounter (Signed)
Left message to call office

## 2020-10-15 ENCOUNTER — Other Ambulatory Visit: Payer: Self-pay | Admitting: Internal Medicine

## 2020-10-15 NOTE — Telephone Encounter (Signed)
Next Visit: 10/19/2020 Last Visit: 09/07/2020  Last Fill: 09/17/2020  Dx: Positive ANA (antinuclear antibody  Current Dose per office note on 09/07/2020, not mentioned   Okay to refill prednisone?

## 2020-10-18 NOTE — Progress Notes (Signed)
Office Visit Note  Patient: Deanna [name]             Date of Birth: 12/06/1960           MRN: [id number]             PCP: Billie Ruddy, MD Referring: Billie Ruddy, MD Visit Date: 10/19/2020   Subjective:  Follow-up (Patient feels as if Prednisone helps with the overall aches. )   History of Present Illness: Deanna [name] is a 60 y.o. female here for follow up for joint pains and positive ANA. At her last visit initial evaluation demonstrated mildly increased CK, very low vitamin D, but negative ANA testing which was previously positive. Recommended starting vitamin D replacement and starting low dose prednisone as therapeutic trial and follow up to reassess symptoms and CK elevations.  Since her last visit she does feel joint symptoms were significantly improved on the 10 mg prednisone.  However she experienced episodes of rash and spontaneous bruising with neurological phenomenon so for evaluation due to history of TTP and similar symptoms.  However work at that time was negative for any evidence of thrombocytopenia or hemolysis.  Currently symptoms have resolved no specific cause of that event was identified.    Review of Systems  Constitutional:  Positive for fatigue.  HENT:  Negative for mouth sores, mouth dryness and nose dryness.   Eyes:  Positive for visual disturbance. Negative for pain, itching and dryness.  Respiratory:  Negative for cough, hemoptysis, shortness of breath and difficulty breathing.   Cardiovascular:  Positive for swelling in legs/feet. Negative for chest pain and palpitations.  Gastrointestinal:  Negative for abdominal pain, blood in stool, constipation and diarrhea.  Endocrine: Negative for increased urination.  Genitourinary:  Negative for painful urination.  Musculoskeletal:  Positive for joint pain, joint pain and joint swelling. Negative for myalgias, muscle weakness, morning stiffness, muscle tenderness and myalgias.  Skin:   Negative for color change, rash and redness.  Allergic/Immunologic: Positive for susceptible to infections.  Neurological:  Positive for dizziness and headaches. Negative for numbness, memory loss and weakness.  Hematological:  Negative for swollen glands.  Psychiatric/Behavioral:  Positive for sleep disturbance. Negative for confusion.    PMFS History:  Patient Active Problem List   Diagnosis Date Noted   Spontaneous bruising 10/19/2020   Positive ANA (antinuclear antibody) 09/07/2020   Raynaud's phenomenon 09/07/2020   Onychomycosis 07/25/2019   Varicose veins of both legs with edema 07/25/2019   H. 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   Aortic root dilatation ([place]) 08/07/2016   Ear pain 07/22/2016   Hyperlipemia 12/02/2015   H/O splenectomy 12/02/2015   Hyperglycemia 01/27/2014   Obesity 01/27/2014   Vitamin D deficiency 09/11/2012    Past Medical History:  Diagnosis Date   Allergy    GERD (gastroesophageal reflux disease)    "does not need meds anymore"   Heart murmur    Hyperlipidemia    Lactose intolerance    TTP (thrombotic thrombocytopenic purpura)     Family History  Problem Relation Age of Onset   Arthritis Mother    Hyperlipidemia Mother    Stroke Mother    Hypertension Mother    Hypertension Father    Lupus Son    Breast cancer Daughter  Lupus Maternal Aunt    Colon cancer Neg Hx    Past Surgical History:  Procedure Laterality Date   ABDOMINAL HYSTERECTOMY  2006   benign tumor, complete   KNEE ARTHROSCOPY Right    NASAL SEPTUM SURGERY     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   Social History   Social History Narrative   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: lives 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, husband and mother       Spiritual Beliefs: Christian      Lifestyle: walks a little a few days per week, diet is ok      Right Handed      Drinks Caffeine         Immunization History  Administered Date(s) Administered   Meningococcal B, OMV 12/02/2015   PFIZER Comirnaty(Gray Top)Covid-19 Tri-Sucrose Vaccine 07/19/2019, 08/09/2019   PPD Test 01/21/2018   Pneumococcal Polysaccharide-23 10/14/2010, 10/21/2014     Objective: Vital Signs: BP 110/68 (BP Location: Left Arm, Patient Position: Sitting, Cuff Size: Large)   Pulse 77   Ht 5\' 9"  (1.753 m)   Wt 221 lb 9.6 oz (100.5 kg)   BMI 32.72 kg/m    Physical Exam Skin:    General: Skin is warm and dry.     Findings: No rash.     Musculoskeletal Exam:  Elbows full ROM no tenderness or swelling Wrists full ROM no tenderness or swelling Fingers full ROM no tenderness or swelling Knees full ROM no tenderness or swelling    Investigation: No additional findings.  Imaging: CT Head Wo Contrast  Result Date: 10/10/2020 CLINICAL DATA:  Dizziness.  Weakness in left arm. EXAM: CT HEAD WITHOUT CONTRAST TECHNIQUE: Contiguous axial images were obtained from the base of the skull through the vertex without intravenous contrast. COMPARISON:  November 27, 2019 FINDINGS: Brain: No evidence of acute infarction, hemorrhage, hydrocephalus, extra-axial collection or mass lesion/mass effect. Moderate brain parenchymal volume loss and deep white matter microangiopathy. Vascular: No hyperdense vessel or unexpected calcification. Skull: Normal. Negative for fracture or focal lesion. Sinuses/Orbits: No acute finding. Other: None. IMPRESSION: 1. No acute intracranial abnormality. 2. Moderate brain parenchymal atrophy and chronic microvascular disease. Electronically Signed   By: Fidela Salisbury M.D.   On: 10/10/2020 11:14   MR BRAIN WO CONTRAST  Result Date: 10/10/2020 CLINICAL DATA:  Stroke follow-up.  Dizziness. EXAM: MRI HEAD WITHOUT CONTRAST TECHNIQUE: Multiplanar,  multiecho pulse sequences of the brain and surrounding structures were obtained without intravenous contrast. COMPARISON:  MRI July 18, 2016. FINDINGS: Brain: No acute infarction, hemorrhage, hydrocephalus, extra-axial collection or mass lesion. Remote right parietal cortical infarct lobe. Similar additional mild T2/FLAIR hyperintensities in the white matter. Mild atrophy with ex vacuo ventricular dilation. Vascular: Major arterial flow voids are maintained skull base. Skull and upper cervical spine: Normal marrow signal. Sinuses/Orbits: Right maxillary sinus retention cyst. Other: No mastoid effusions. IMPRESSION: 1. No evidence of acute intracranial abnormality.  No acute infarct. 2. Similar prior right parietal cortical infarct and mild nonspecific T2/FLAIR hyperintensities in the white matter. Electronically Signed   By: Margaretha Sheffield MD   On: 10/10/2020 16:57     Recent Labs: Lab Results  Component Value Date   WBC 9.2 10/10/2020   HGB 12.8 10/10/2020   PLT 477 (H) 10/10/2020   NA 139 10/10/2020   K 3.8 10/10/2020   CL 106 10/10/2020   CO2 27 10/10/2020   GLUCOSE 87 10/10/2020  BUN 13 10/10/2020   CREATININE 0.91 10/10/2020   BILITOT 0.7 10/10/2020   ALKPHOS 71 10/10/2020   AST 14 (L) 10/10/2020   ALT 16 10/10/2020   PROT 8.1 10/10/2020   ALBUMIN 3.9 10/10/2020   CALCIUM 9.1 10/10/2020   GFRAA >60 11/27/2019   QFTBGOLDPLUS NEGATIVE 01/17/2018    Speciality Comments: No specialty comments available.  Procedures:  No procedures performed Allergies: Eggs or egg-derived products and Lactose intolerance (gi)   Assessment / Plan:     Visit Diagnoses: Positive ANA (antinuclear antibody)  Positive ANA antibodies with seemingly inflammatory arthralgias some episodes of spontaneous bruising and her Raynaud's phenomenon but ENA panels were all negative.  Will send for [facility] CTD disease panel testing for further evaluation.  Recommended she can continue daily prednisone for now to  see if this still controls symptoms while ongoing work-up.  Raynaud's phenomenon without gangrene  Symptoms somewhat improved due to warmer temperature exposures otherwise unchanged.  Spontaneous bruising  Dizziness and bruising she did feel it was somewhat similar to her past TTP episodes but was not the case and symptoms are currently improved at this time.  Orders: No orders of the defined types were placed in this encounter.  No orders of the defined types were placed in this encounter.    Follow-Up Instructions: Return in about 3 weeks (around 11/09/2020) for [facility] labs f/u 3wks.   Collier [place]ina, MD  Note - This record has been created using [employer].  Chart creation errors have been sought, but may not always  have been located. Such creation errors do not reflect on  the standard of medical care.

## 2020-10-19 ENCOUNTER — Encounter: Payer: Self-pay | Admitting: Internal Medicine

## 2020-10-19 ENCOUNTER — Ambulatory Visit (INDEPENDENT_AMBULATORY_CARE_PROVIDER_SITE_OTHER): Payer: 59 | Admitting: Internal Medicine

## 2020-10-19 ENCOUNTER — Other Ambulatory Visit: Payer: Self-pay

## 2020-10-19 VITALS — BP 110/68 | HR 77 | Ht 69.0 in | Wt 221.6 lb

## 2020-10-19 DIAGNOSIS — R768 Other specified abnormal immunological findings in serum: Secondary | ICD-10-CM | POA: Diagnosis not present

## 2020-10-19 DIAGNOSIS — I7300 Raynaud's syndrome without gangrene: Secondary | ICD-10-CM | POA: Diagnosis not present

## 2020-10-19 DIAGNOSIS — R233 Spontaneous ecchymoses: Secondary | ICD-10-CM | POA: Diagnosis not present

## 2020-10-20 ENCOUNTER — Encounter: Payer: Self-pay | Admitting: Internal Medicine

## 2020-10-25 ENCOUNTER — Other Ambulatory Visit: Payer: Self-pay

## 2020-10-25 ENCOUNTER — Other Ambulatory Visit: Payer: Self-pay | Admitting: Family Medicine

## 2020-10-25 ENCOUNTER — Ambulatory Visit
Admission: RE | Admit: 2020-10-25 | Discharge: 2020-10-25 | Disposition: A | Discharge status: Home / Self Care | Payer: 59 | Source: Ambulatory Visit | Attending: Family Medicine | Admitting: Family Medicine

## 2020-10-25 DIAGNOSIS — Z1231 Encounter for screening mammogram for malignant neoplasm of breast: Secondary | ICD-10-CM

## 2020-10-29 ENCOUNTER — Emergency Department (HOSPITAL_BASED_OUTPATIENT_CLINIC_OR_DEPARTMENT_OTHER)
Admission: EM | Admit: 2020-10-29 | Discharge: 2020-10-29 | Disposition: A | Discharge status: Home / Self Care | Payer: 59 | Attending: Emergency Medicine | Admitting: Emergency Medicine

## 2020-10-29 ENCOUNTER — Other Ambulatory Visit: Payer: Self-pay

## 2020-10-29 ENCOUNTER — Encounter (HOSPITAL_BASED_OUTPATIENT_CLINIC_OR_DEPARTMENT_OTHER): Payer: Self-pay

## 2020-10-29 DIAGNOSIS — B349 Viral infection, unspecified: Secondary | ICD-10-CM | POA: Insufficient documentation

## 2020-10-29 DIAGNOSIS — R059 Cough, unspecified: Secondary | ICD-10-CM | POA: Diagnosis present

## 2020-10-29 DIAGNOSIS — Z7902 Long term (current) use of antithrombotics/antiplatelets: Secondary | ICD-10-CM | POA: Insufficient documentation

## 2020-10-29 LAB — GROUP A STREP BY PCR: Group A Strep by PCR: NOT DETECTED

## 2020-10-29 NOTE — ED Triage Notes (Addendum)
Pt presents with R eye redness, sore throat, cough. Pt works at a daycare. Recent contact with hand, foot, mouth disease. Denies fever.

## 2020-10-29 NOTE — Discharge Instructions (Addendum)
Call your primary care doctor or specialist as discussed in the next 2-3 days.   Return immediately back to the ER if:  Your symptoms worsen within the next 12-24 hours. You develop new symptoms such as new fevers, persistent vomiting, new pain, shortness of breath, or new weakness or numbness, or if you have any other concerns.  

## 2020-10-29 NOTE — ED Provider Notes (Signed)
[facility] [facility] EMERGENCY DEPARTMENT Provider Note   CSN: [id number] Arrival date & time: 10/29/20  0914     History Chief Complaint  Patient presents with   Eye Problem   Sore Throat    Deanna [name] is a 60 y.o. female.  Patient presents with irritation of both eyes cough and sore throat ongoing for 5 days.  She states that she was worried she may have strep throat.  She otherwise denies any fevers.  Has had it unchanged cough about 5 days.  Denies trouble breathing denies headache or chest pain or abdominal pain.      Past Medical History:  Diagnosis Date   Allergy    GERD (gastroesophageal reflux disease)    "does not need meds anymore"   Heart murmur    Hyperlipidemia    Lactose intolerance    TTP (thrombotic thrombocytopenic purpura)     Patient Active Problem List   Diagnosis Date Noted   Spontaneous bruising 10/19/2020   Positive ANA (antinuclear antibody) 09/07/2020   Raynaud's phenomenon 09/07/2020   Onychomycosis 07/25/2019   Varicose veins of both legs with edema 07/25/2019   H. 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   Aortic root dilatation ([place]) 08/07/2016   Ear pain 07/22/2016   Hyperlipemia 12/02/2015   H/O splenectomy 12/02/2015   Hyperglycemia 01/27/2014   Obesity 01/27/2014   Vitamin D deficiency 09/11/2012    Past Surgical History:  Procedure Laterality Date   ABDOMINAL HYSTERECTOMY  2006   benign tumor, complete   KNEE ARTHROSCOPY Right    NASAL SEPTUM SURGERY     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     OB History   No obstetric history on file.     Family History  Problem Relation Age of Onset   Arthritis Mother    Hyperlipidemia Mother    Stroke Mother    Hypertension Mother    Hypertension Father    Lupus Son     Breast cancer Daughter    Lupus Maternal Aunt    Colon cancer Neg Hx     Social History   Tobacco Use   Smoking status: Never   Smokeless tobacco: Never  Vaping Use   Vaping Use: Never used  Substance Use Topics   Alcohol use: No   Drug use: No    Home Medications Prior to Admission medications   Medication Sig Start Date End Date Taking? Authorizing Provider  Cholecalciferol 100 MCG (4000 UT) CAPS Take by mouth daily.    [provider]  clopidogrel (PLAVIX) 75 MG tablet Take 1 tablet (75 mg total) by mouth daily. 12/09/19   Cameron Sprang, MD  Ferrous Sulfate (IRON PO) Take by mouth.    [provider]  Naproxen Sodium (ALEVE PO) Take by mouth as needed.    [provider]  predniSONE (DELTASONE) 10 MG tablet TAKE 1 TABLET (10 MG TOTAL) BY MOUTH DAILY WITH BREAKFAST. 10/15/20   Rice, Resa Miner, MD    Allergies    Eggs or egg-derived products and Lactose intolerance (gi)  Review of Systems   Review of Systems  Constitutional:  Negative for fever.  HENT:  Negative for ear pain.   Eyes:  Negative for pain.  Respiratory:  Positive for cough.  Cardiovascular:  Negative for chest pain.  Gastrointestinal:  Negative for abdominal pain.  Genitourinary:  Negative for flank pain.  Musculoskeletal:  Negative for back pain.  Skin:  Negative for rash.  Neurological:  Negative for headaches.   Physical Exam Updated Vital Signs BP (!) 141/83 (BP Location: Right Arm)   Pulse 71   Temp 98.7 F (37.1 C) (Oral)   Resp 16   Ht 5\' 9"  (1.753 m)   Wt 99.8 kg   SpO2 98%   BMI 32.49 kg/m   Physical Exam Constitutional:      General: She is not in acute distress.    Appearance: Normal appearance.  HENT:     Head: Normocephalic.     Nose: Nose normal.     Mouth/Throat:     Mouth: Mucous membranes are moist. No oral lesions.     Pharynx: Oropharynx is clear. Uvula midline. No oropharyngeal exudate or uvula swelling.  Eyes:     Extraocular  Movements: Extraocular movements intact.     Comments: Mild bilateral conjunctivitis  Cardiovascular:     Rate and Rhythm: Normal rate.  Pulmonary:     Effort: Pulmonary effort is normal.  Musculoskeletal:        General: Normal range of motion.     Cervical back: Normal range of motion.  Lymphadenopathy:     Cervical: No cervical adenopathy.  Neurological:     General: No focal deficit present.     Mental Status: She is alert. Mental status is at baseline.    ED Results / Procedures / Treatments   Labs (all labs ordered are listed, but only abnormal results are displayed) Labs Reviewed  GROUP A STREP BY PCR    EKG None  Radiology No results found.  Procedures Procedures   Medications Ordered in ED Medications - No data to display  ED Course  I have reviewed the triage vital signs and the nursing notes.  Pertinent labs & imaging results that were available during my care of the patient were reviewed by me and considered in my medical decision making (see chart for details).    MDM Rules/Calculators/A&P                          Rapid strep test is negative.  Suspect viral etiology.  We discussed testing for COVID, however patient states she is already had a recent test that was negative and declines further testing here.  Recommending follow-up with her doctor as needed within the next 2 to 3 days, advised immediate return for worsening symptoms difficulty breathing or any additional concerns.  Final Clinical Impression(s) / ED Diagnoses Final diagnoses:  Viral illness    Rx / DC Orders ED Discharge Orders     None        Luna Fuse, MD 10/29/20 1018

## 2020-11-09 ENCOUNTER — Encounter: Payer: Self-pay | Admitting: Internal Medicine

## 2020-11-09 ENCOUNTER — Other Ambulatory Visit: Payer: Self-pay

## 2020-11-09 ENCOUNTER — Ambulatory Visit (INDEPENDENT_AMBULATORY_CARE_PROVIDER_SITE_OTHER): Payer: 59 | Admitting: Internal Medicine

## 2020-11-09 VITALS — BP 131/82 | HR 66 | Ht 69.0 in | Wt 222.8 lb

## 2020-11-09 DIAGNOSIS — I7300 Raynaud's syndrome without gangrene: Secondary | ICD-10-CM | POA: Diagnosis not present

## 2020-11-09 DIAGNOSIS — R768 Other specified abnormal immunological findings in serum: Secondary | ICD-10-CM | POA: Diagnosis not present

## 2020-11-09 DIAGNOSIS — R233 Spontaneous ecchymoses: Secondary | ICD-10-CM

## 2020-11-09 MED ORDER — PREDNISONE 5 MG PO TABS: 2.5000 mg | ORAL_TABLET | Freq: Every day | ORAL | 0 refills | Status: AC

## 2020-11-09 MED ORDER — HYDROXYCHLOROQUINE SULFATE 200 MG PO TABS: 400.0000 mg | ORAL_TABLET | Freq: Every day | ORAL | 1 refills | Status: DC

## 2020-11-09 NOTE — Progress Notes (Signed)
Office Visit Note  Patient: Deanna [name]             Date of Birth: 1960/06/07           MRN: [id number]             PCP: Billie Ruddy, MD Referring: Billie Ruddy, MD Visit Date: 11/09/2020   Subjective:  Follow-up (Patient feels as if symptoms are somewhat improved since last visit. Patient is taking Prednisone 5 mg daily. Patient is here to review [facility] lab results.)   History of Present Illness: Deanna [name] is a 60 y.o. female here for follow up for fatigue, arthralgias, raynaud's symptoms with positive ANA recommended to go for [facility] CTD panel testing for further evaluation. We continued low dose prednisone in the interim due to reported benefit with this treatment.  [facility] lab results were positive for lupus based on a highly positive ANA titer and moderately positive CB-CAP.  [facility] Labs reviewed ANA 1:640 speckled CB-CAP EC4d 16 CB-CAP BC4d 90  Lupus manifestations Fatigue Arthralgia Raynaud's ?Spontaneous bruising Hx of TTP s/p splenectomy (primary vs secondary?)  Previous HPI 09/07/20 Deanna [name] is a 60 y.o. female here for evaluation of positive ANA with fatigue and myalgias. Symptoms are persistent since last year with fatigue, pains in her hands and ankles and stiffness but she has had some joint pains going back for years. She typically notices swelling at the ankles by the end of the day. This is moderately painful but not debilitating. Her hand pain is worst with use during the day and she notices swelling when eating a lot of salt. 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 She does experience raynaud's symptoms with pallor in fingertips without any lesions ulcers or injuries. She denies problems with mouth and eye dryness, lymphadenopathy, chest pain, skin rashes, or photosensitive   Labs reviewed ANA pos Scl-70 1.3  10/19/20 At her last visit initial evaluation  demonstrated mildly increased CK, very low vitamin D, but negative ANA testing which was previously positive. Recommended starting vitamin D replacement and starting low dose prednisone as therapeutic trial and follow up to reassess symptoms and CK elevations.  Since her last visit she does feel joint symptoms were significantly improved on the 10 mg prednisone.  However she experienced episodes of rash and spontaneous bruising with neurological phenomenon so for evaluation due to history of TTP and similar symptoms.  However work at that time was negative for any evidence of thrombocytopenia or hemolysis.  Currently symptoms have resolved no specific cause of that event was identified.  Review of Systems  Constitutional:  Negative for fatigue.  HENT:  Negative for mouth sores, mouth dryness and nose dryness.   Eyes:  Positive for redness. Negative for pain, itching, visual disturbance and dryness.  Respiratory:  Negative for cough, hemoptysis, shortness of breath and difficulty breathing.   Cardiovascular:  Negative for chest pain, palpitations and swelling in legs/feet.  Gastrointestinal:  Negative for abdominal pain, blood in stool, constipation and diarrhea.  Endocrine: Negative for increased urination.  Genitourinary:  Negative for painful urination.  Musculoskeletal:  Negative for joint pain, joint pain, joint swelling, myalgias, muscle weakness, morning stiffness, muscle tenderness and myalgias.  Skin:  Negative for color change, rash and redness.  Allergic/Immunologic: Positive for susceptible to infections.  Neurological:  Positive for dizziness, headaches and memory loss. Negative for numbness and weakness.  Hematological:  Negative for  swollen glands.  Psychiatric/Behavioral:  Positive for confusion and sleep disturbance.     PMFS History:  Patient Active Problem List   Diagnosis Date Noted   Spontaneous bruising 10/19/2020   Positive ANA (antinuclear antibody) 09/07/2020    Raynaud's phenomenon 09/07/2020   Onychomycosis 07/25/2019   Varicose veins of both legs with edema 07/25/2019   H. 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   Aortic root dilatation ([place]) 08/07/2016   Ear pain 07/22/2016   Hyperlipemia 12/02/2015   H/O splenectomy 12/02/2015   Hyperglycemia 01/27/2014   Obesity 01/27/2014   Vitamin D deficiency 09/11/2012    Past Medical History:  Diagnosis Date   Allergy    GERD (gastroesophageal reflux disease)    "does not need meds anymore"   Heart murmur    Hyperlipidemia    Lactose intolerance    TTP (thrombotic thrombocytopenic purpura)     Family History  Problem Relation Age of Onset   Arthritis Mother    Hyperlipidemia Mother    Stroke Mother    Hypertension Mother    Hypertension Father    Lupus Son    Breast cancer Daughter    Lupus Maternal Aunt    Colon cancer Neg Hx    Past Surgical History:  Procedure Laterality Date   ABDOMINAL HYSTERECTOMY  2006   benign tumor, complete   KNEE ARTHROSCOPY Right    NASAL SEPTUM SURGERY     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   Social History   Social History Narrative   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: lives 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, husband and mother      Spiritual Beliefs: Christian      Lifestyle: walks a little a few days per week, diet is ok      Right Handed      Drinks Caffeine         Immunization History  Administered Date(s) Administered   Meningococcal B, OMV 12/02/2015   PFIZER Comirnaty(Gray Top)Covid-19 Tri-Sucrose Vaccine 07/19/2019, 08/09/2019   PPD Test 01/21/2018   Pneumococcal Polysaccharide-23 10/14/2010, 10/21/2014     Objective: Vital Signs: BP 131/82 (BP Location: Left Arm, Patient Position: Sitting, Cuff Size:  Normal)   Pulse 66   Ht 5\' 9"  (1.753 m)   Wt 222 lb 12.8 oz (101.1 kg)   BMI 32.90 kg/m    Physical Exam Skin:    General: Skin is warm and dry.     Findings: No rash.  Neurological:     General: No focal deficit present.     Mental Status: She is alert.  Psychiatric:        Mood and Affect: Mood normal.     Musculoskeletal Exam:  Elbows full ROM no tenderness or swelling Wrists full ROM no tenderness or swelling Fingers full ROM no tenderness or swelling Knees full ROM no tenderness or swelling Ankles full ROM no tenderness or swelling  Investigation: No additional findings.  Imaging: MM 3D SCREEN BREAST BILATERAL  Result Date: 10/28/2020 CLINICAL DATA:  Screening. EXAM: DIGITAL SCREENING BILATERAL MAMMOGRAM WITH TOMOSYNTHESIS AND CAD TECHNIQUE: Bilateral screening digital craniocaudal and mediolateral oblique mammograms were obtained. Bilateral screening digital breast tomosynthesis was performed. The images were evaluated with computer-aided detection. COMPARISON:  Previous  exam(s). ACR Breast Density Category b: There are scattered areas of fibroglandular density. FINDINGS: There are no findings suspicious for malignancy. The images were evaluated with computer-aided detection. IMPRESSION: No mammographic evidence of malignancy. A result letter of this screening mammogram will be mailed directly to the patient. RECOMMENDATION: Screening mammogram in one year. (Code:[id number]-B-01Y) BI-RADS CATEGORY  1: Negative. Electronically Signed   By: Abelardo Diesel M.D.   On: 10/28/2020 09:09    Recent Labs: Lab Results  Component Value Date   WBC 9.2 10/10/2020   HGB 12.8 10/10/2020   PLT 477 (H) 10/10/2020   NA 139 10/10/2020   K 3.8 10/10/2020   CL 106 10/10/2020   CO2 27 10/10/2020   GLUCOSE 87 10/10/2020   BUN 13 10/10/2020   CREATININE 0.91 10/10/2020   BILITOT 0.7 10/10/2020   ALKPHOS 71 10/10/2020   AST 14 (L) 10/10/2020   ALT 16 10/10/2020   PROT 8.1 10/10/2020   ALBUMIN  3.9 10/10/2020   CALCIUM 9.1 10/10/2020   GFRAA >60 11/27/2019   QFTBGOLDPLUS NEGATIVE 01/17/2018    Speciality Comments: No specialty comments available.  Procedures:  No procedures performed Allergies: Eggs or egg-derived products and Lactose intolerance (gi)   Assessment / Plan:     Visit Diagnoses: Positive ANA (antinuclear antibody) - Plan: hydroxychloroquine (PLAQUENIL) 200 MG tablet, predniSONE (DELTASONE) 5 MG tablet  Positive ANA serology but with constellation of symptoms and serologic results now looks most consistent with systemic lupus.  Plan to start treatment on hydroxychloroquine at 400 mg daily.  We will plan to follow-up in about 8 weeks for response to treatment she can continue low-dose prednisone recommended she try to taper or discontinue this over the interval after giving the medicine a few weeks to work.  Discussed medication and potential side effects including sensitivity reaction, QT prolongation, retinal toxicity requiring annual monitoring.  Raynaud's phenomenon without gangrene - Plan: hydroxychloroquine (PLAQUENIL) 200 MG tablet, predniSONE (DELTASONE) 5 MG tablet  Raynaud's symptoms no evidence of ischemic injury doing reasonably well which is typical for the summertime.  Treatment of underlying disease with starting Plaquenil today no symptoms specific treatment recommended right now.  Spontaneous bruising  Unclear if related to systemic connective tissue disease, hematological condition with history of TTP, or other cause.  She does not describe symptoms suggestive for cutaneous vasculitis at this time.  No history of thromboses or racemosa.  Orders: No orders of the defined types were placed in this encounter.  Meds ordered this encounter  Medications   hydroxychloroquine (PLAQUENIL) 200 MG tablet    Sig: Take 2 tablets (400 mg total) by mouth daily.    Dispense:  60 tablet    Refill:  1   predniSONE (DELTASONE) 5 MG tablet    Sig: Take 0.5  tablets (2.5 mg total) by mouth daily with breakfast for 14 days.    Dispense:  30 tablet    Refill:  0      Follow-Up Instructions: Return in about 2 months (around 01/09/2021) for SLE new HCQ start 8wks f/u.   Collier Salina, MD  Note - This record has been created using [employer].  Chart creation errors have been sought, but may not always  have been located. Such creation errors do not reflect on  the standard of medical care.

## 2020-11-18 ENCOUNTER — Ambulatory Visit: Payer: 59

## 2020-11-18 ENCOUNTER — Other Ambulatory Visit: Payer: Self-pay | Admitting: Internal Medicine

## 2020-11-23 ENCOUNTER — Other Ambulatory Visit: Payer: Self-pay

## 2020-11-23 ENCOUNTER — Encounter: Payer: Self-pay | Admitting: Pulmonary Disease

## 2020-11-23 ENCOUNTER — Ambulatory Visit (INDEPENDENT_AMBULATORY_CARE_PROVIDER_SITE_OTHER): Payer: 59 | Admitting: Pulmonary Disease

## 2020-11-23 VITALS — BP 130/70 | HR 69 | Ht 69.0 in | Wt 219.8 lb

## 2020-11-23 DIAGNOSIS — R0683 Snoring: Secondary | ICD-10-CM

## 2020-11-23 NOTE — Patient Instructions (Signed)
Follow up in 4 weeks to review sleep study

## 2020-11-23 NOTE — Progress Notes (Signed)
[place] [facility]  Chief Complaint  Patient presents with   Consult    Patient states that she snores, deviated septum and had surgery to correct. Denies that she stops breathing or gasps for air. Patient states that if she sits down not doing anything she is asleep    Constitutional:  BP 130/70 (BP Location: Left Arm, Patient Position: Sitting, Cuff Size: Normal)   Pulse 69   Ht 5\' 9"  (1.753 m)   Wt 219 lb 12.8 oz (99.7 kg)   SpO2 100%   BMI 32.46 kg/m   Past Medical History:  GERD, Allergies, HLD, Lactose intolerance, Thrombotic thrombocytopenic purpura, Positive ANA  Past Surgical History:  She  has a past surgical history that includes Splenectomy (1987); Abdominal hysterectomy (2006); Knee arthroscopy (Right); and Nasal septum surgery.  Brief Summary:  Deanna [name] is a 60 y.o. female with snoring.      Subjective:   She has noticed trouble with snoring for a while.  Over the past few months she has noticed trouble staying awake during the day.  She was diagnosed with lupus and improved after therapy.  However, her snoring and daytime sleepiness persist.  She has woken up feeling choked and has trouble sleeping on her back.    She goes to sleep at 11 pm.  She falls asleep in few.  She wakes up few times to use the bathroom.  She gets out of bed at 6 am.  She feels tired in the morning.  She denies morning headache.  She does not use anything to help her fall sleep or stay awake.  She denies sleep walking, sleep talking, bruxism, or nightmares.  There is no history of restless legs.  She denies sleep hallucinations, sleep paralysis, or cataplexy.  The Epworth score is 10 out of 24.   Physical Exam:   Appearance - well kempt   ENMT - no sinus tenderness, no oral exudate, no LAN, Mallampati 4 airway, no stridor, scalloped tongue, high arched palate  Respiratory - equal breath sounds bilaterally, no wheezing or rales  CV  - s1s2 regular rate and rhythm, no murmurs  Ext - no clubbing, no edema  Skin - no rashes  Psych - normal mood and affect   Sleep Tests:    Cardiac Tests:  Echo 07/28/16 >> EF 60 to 65%, grade 1 DD, mild LVH, mild AR, aortic root 38 mm  Social History:  She  reports that she has never smoked. She has never used smokeless tobacco. She reports that she does not drink alcohol and does not use drugs.  Family History:  Her family history includes Arthritis in her mother; Breast cancer in her daughter; Hyperlipidemia in her mother; Hypertension in her father and mother; Lupus in her maternal aunt and son; Stroke in her mother.    Discussion:  She has snoring, sleep disruption, apnea, and daytime sleepiness.  I am concerned she could have obstructive sleep apnea.  Assessment/Plan:   Snoring with excessive daytime sleepiness. - she has in lab sleep study scheduled through PCP for 11/26/20 - will arrange for follow up in few weeks to review results  Positive ANA. - followed by Dr. Vernelle Emerald with [facility]  Obesity. - discussed how weight can impact sleep and risk for sleep disordered breathing - discussed options to assist with weight loss: combination of diet modification, cardiovascular and strength training exercises  Cardiovascular risk. - had an extensive discussion regarding the adverse  health consequences related to untreated sleep disordered breathing - specifically discussed the risks for hypertension, coronary artery disease, cardiac dysrhythmias, cerebrovascular disease, and diabetes - lifestyle modification discussed  Safe driving practices. - discussed how sleep disruption can increase risk of accidents, particularly when driving - safe driving practices were discussed  Therapies for obstructive sleep apnea. - if the sleep study shows significant sleep apnea, then various therapies for treatment were reviewed: CPAP, oral appliance, and surgical  interventions  Time Spent Involved in Patient Care on Day of Examination:  32 minutes  Follow up:   Patient Instructions  Follow up in 4 weeks to review sleep study  Medication List:   Allergies as of 11/23/2020       Reactions   Eggs Or Egg-derived Products Nausea And Vomiting   Lactose Intolerance (gi) Other (See Comments)   vomiting        Medication List        Accurate as of November 23, 2020  2:01 PM. If you have any questions, ask your nurse or doctor.          ALEVE PO Take by mouth as needed.   Cholecalciferol 100 MCG (4000 UT) Caps Take by mouth daily.   clopidogrel 75 MG tablet Commonly known as: PLAVIX Take 1 tablet (75 mg total) by mouth daily.   hydroxychloroquine 200 MG tablet Commonly known as: PLAQUENIL Take 2 tablets (400 mg total) by mouth daily.   IRON PO Take by mouth.   predniSONE 5 MG tablet Commonly known as: DELTASONE Take 0.5 tablets (2.5 mg total) by mouth daily with breakfast for 14 days.        Signature:  Chesley Mires, MD [place] Pager - [phone] 11/23/2020, 2:01 PM

## 2020-11-26 ENCOUNTER — Ambulatory Visit (HOSPITAL_BASED_OUTPATIENT_CLINIC_OR_DEPARTMENT_OTHER): Payer: 59 | Attending: Family Medicine | Admitting: Internal Medicine

## 2020-11-26 ENCOUNTER — Other Ambulatory Visit: Payer: Self-pay

## 2020-11-26 VITALS — Ht 69.0 in | Wt 219.0 lb

## 2020-11-26 DIAGNOSIS — G4733 Obstructive sleep apnea (adult) (pediatric): Secondary | ICD-10-CM | POA: Diagnosis present

## 2020-11-26 DIAGNOSIS — R0683 Snoring: Secondary | ICD-10-CM | POA: Diagnosis not present

## 2020-12-04 DIAGNOSIS — R0683 Snoring: Secondary | ICD-10-CM | POA: Diagnosis not present

## 2020-12-04 NOTE — Procedures (Signed)
    Patient Name: Deanna [name], Deanna [name] Date: 11/26/2020 Gender: Female D.O.B: 1960-12-06 Age (years): 18 Referring Provider: Grier Mitts MD Height (inches): 69 Interpreting Physician: Baird Lyons MD, ABSM Weight (lbs): 218 RPSGT: Earney [place] BMI: 32 MRN: [id number] Neck Size: 15.00  CLINICAL INFORMATION Sleep Study Type: NPSG Indication for sleep study: Snoring Epworth Sleepiness Score: 5  SLEEP STUDY TECHNIQUE As per the AASM Manual for the Scoring of Sleep and Associated Events v2.3 (April 2016) with a hypopnea requiring 4% desaturations.  The channels recorded and monitored were frontal, central and occipital EEG, electrooculogram (EOG), submentalis EMG (chin), nasal and oral airflow, thoracic and abdominal wall motion, anterior tibialis EMG, snore microphone, electrocardiogram, and pulse oximetry.  MEDICATIONS Medications self-administered by patient taken the night of the study : VITAMIN D3, CLOPIDOGREL  SLEEP ARCHITECTURE The study was initiated at 10:41:03 PM and ended at 4:47:41 AM.  Sleep onset time was 5.4 minutes and the sleep efficiency was 83.6%%. The total sleep time was 306.5 minutes.  Stage REM latency was 70.0 minutes.  The patient spent 3.3%% of the night in stage N1 sleep, 68.8%% in stage N2 sleep, 0.0%% in stage N3 and 27.9% in REM.  Alpha intrusion was absent.  Supine sleep was 36.03%.  RESPIRATORY PARAMETERS The overall apnea/hypopnea index (AHI) was 13.7 per hour. There were 6 total apneas, including 6 obstructive, 0 central and 0 mixed apneas. There were 64 hypopneas and 51 RERAs.  The AHI during Stage REM sleep was 26.0 per hour.  AHI while supine was 18.5 per hour.  The mean oxygen saturation was 96.3%. The minimum SpO2 during sleep was 87.0%.  moderate snoring was noted during this study.  CARDIAC DATA The 2 lead EKG demonstrated sinus rhythm. The mean heart rate was 58.8 beats per minute. Other EKG findings include:  None.  LEG MOVEMENT DATA The total PLMS were 0 with a resulting PLMS index of 0.0. Associated arousal with leg movement index was 0.0 .  IMPRESSIONS - Mild obstructive sleep apnea occurred during this study (AHI = 13.7/h). - Mild oxygen desaturation was noted during this study (Min O2 = 87.0%). Mean O2 saturation 96.3%. - The patient snored with moderate snoring volume. - No cardiac abnormalities were noted during this study. - Clinically significant periodic limb movements did not occur during sleep. No significant associated arousals . DIAGNOSIS - Obstructive Sleep Apnea (G47.33)  RECOMMENDATIONS - Suggest CPAP titration sleep study, autopap or fitted oral appliance. Other options or management consultation would be based on clinical judgment. - Be careful with alcohol, sedatives and other CNS depressants that may worsen sleep apnea and disrupt normal sleep architecture. - Sleep hygiene should be reviewed to assess factors that may improve sleep quality. - Weight management and regular exercise should be initiated or continued if appropriate.  [Electronically signed] 12/04/2020 11:59 AM  Baird Lyons MD, ABSM Diplomate, American Board of Sleep Medicine   NPI: [id number]                         [place], [place] [facility]  ELECTRONICALLY SIGNED ON:  12/04/2020, 11:56 AM [place] PH: [phone]   FX: [phone] [place]

## 2020-12-28 ENCOUNTER — Ambulatory Visit: Payer: 59 | Admitting: Internal Medicine

## 2020-12-29 ENCOUNTER — Telehealth: Payer: Self-pay | Admitting: Internal Medicine

## 2020-12-29 MED ORDER — PREDNISONE 5 MG PO TABS: 5.0000 mg | ORAL_TABLET | Freq: Every day | ORAL | 0 refills | Status: DC | PRN

## 2020-12-29 NOTE — Telephone Encounter (Signed)
Patient calling in reference to having body aches, headaches and pain this week since stopping Prednisone. Patient wanted to know if she should go back on Prednisone, or if not what does doctor recommend? Patient has been taking Aleve without much relief. Patient uses [facility] on [address]. Please call to advise.

## 2020-12-29 NOTE — Telephone Encounter (Signed)
Spoke with patient, advised Dr. Benjamine Mola thinks it would be okay to resume prednisone 5 mg daily for now if symptoms are doing much worse since it seemed to be helpful at this dose previously. New Rx has been sent.

## 2020-12-29 NOTE — Telephone Encounter (Signed)
I think it would be okay to resume prednisone 5 mg daily for now if symptoms are doing much worse since it seemed to be helpful at this dose previously. I will send new Rx for this.

## 2021-01-06 ENCOUNTER — Other Ambulatory Visit: Payer: Self-pay | Admitting: Internal Medicine

## 2021-01-06 DIAGNOSIS — I7300 Raynaud's syndrome without gangrene: Secondary | ICD-10-CM

## 2021-01-06 DIAGNOSIS — R768 Other specified abnormal immunological findings in serum: Secondary | ICD-10-CM

## 2021-01-10 NOTE — Progress Notes (Signed)
Office Visit Note  Patient: Deanna [name]             Date of Birth: 1960-09-09           MRN: [id number]             PCP: Billie Ruddy, MD Referring: Billie Ruddy, MD Visit Date: 01/11/2021   Subjective:  Follow-up (Doing good)   History of Present Illness: Deanna [name] is a 60 y.o. female here for follow up for UCTD after starting HCQ 400 mg PO daily and prednisone 5 mg PO daily.  Overall doing well on this but has continued the low-dose steroids.  She has not noticed problems with taking the medication.  Previous HPI: 11/09/20 Deanna [name] is a 60 y.o. female here for follow up for fatigue, arthralgias, raynaud's symptoms with positive ANA recommended to go for [facility] CTD panel testing for further evaluation. We continued low dose prednisone in the interim due to reported benefit with this treatment.  [facility] lab results were positive for lupus based on a highly positive ANA titer and moderately positive CB-CAP.   [facility] Labs reviewed ANA 1:640 speckled CB-CAP EC4d 16 CB-CAP BC4d 90   Lupus manifestations Fatigue Arthralgia Raynaud's ?Spontaneous bruising Hx of TTP s/p splenectomy (primary vs secondary?)    09/07/20 Deanna [name] is a 60 y.o. female here for evaluation of positive ANA with fatigue and myalgias. Symptoms are persistent since last year with fatigue, pains in her hands and ankles and stiffness but she has had some joint pains going back for years. She typically notices swelling at the ankles by the end of the day. This is moderately painful but not debilitating. Her hand pain is worst with use during the day and she notices swelling when eating a lot of salt. 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 She does experience raynaud's symptoms with pallor in fingertips without any lesions ulcers or injuries. She denies problems with mouth and eye dryness, lymphadenopathy,  chest pain, skin rashes, or photosensitive   Labs reviewed ANA pos Scl-70 1.3   Review of Systems  Constitutional:  Positive for fatigue.  HENT:  Negative for mouth dryness.   Eyes:  Negative for dryness.  Respiratory:  Negative for shortness of breath.   Cardiovascular:  Negative for swelling in legs/feet.  Gastrointestinal:  Negative for constipation.  Endocrine: Negative for excessive thirst.  Genitourinary:  Negative for difficulty urinating.  Musculoskeletal:  Positive for joint pain, gait problem, joint pain, joint swelling, muscle weakness and morning stiffness.  Skin:  Negative for rash.  Allergic/Immunologic: Positive for susceptible to infections.  Neurological:  Positive for weakness.  Hematological:  Positive for bruising/bleeding tendency.  Psychiatric/Behavioral:  Positive for sleep disturbance.    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   Aortic root dilatation ([place]) 08/07/2016   Ear pain 07/22/2016   Hyperlipemia 12/02/2015   H/O splenectomy 12/02/2015  Hyperglycemia 01/27/2014   Obesity 01/27/2014   Vitamin D deficiency 09/11/2012    Past Medical History:  Diagnosis Date   Allergy    GERD (gastroesophageal reflux disease)    "does not need meds anymore"   Heart murmur    Hyperlipidemia    Lactose intolerance    OSA (obstructive sleep apnea)    Positive ANA (antinuclear antibody)    TTP (thrombotic thrombocytopenic purpura)     Family History   Problem Relation Age of Onset   Arthritis Mother    Hyperlipidemia Mother    Stroke Mother    Hypertension Mother    Hypertension Father    Lupus Son    Breast cancer Daughter    Lupus Maternal Aunt    Colon cancer Neg Hx    Past Surgical History:  Procedure Laterality Date   ABDOMINAL HYSTERECTOMY  2006   benign tumor, complete   KNEE ARTHROSCOPY Right    NASAL SEPTUM SURGERY     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   Social History   Social History Narrative   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: lives 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, husband and mother      Spiritual Beliefs: Christian      Lifestyle: walks a little a few days per week, diet is ok      Right Handed      Drinks Caffeine         Immunization History  Administered Date(s) Administered   Meningococcal B, OMV 12/02/2015   PFIZER Comirnaty(Gray Top)Covid-19 Tri-Sucrose Vaccine 07/19/2019, 08/09/2019, 06/16/2020   PPD Test 01/21/2018   Pneumococcal Polysaccharide-23 10/14/2010, 10/21/2014     Objective: Vital Signs: BP 125/73 (BP Location: Left Arm, Patient Position: Sitting, Cuff Size: Normal)   Pulse 74   Resp 15   Ht '5\' 9"'$  (1.753 m)   Wt 224 lb (101.6 kg)   BMI 33.08 kg/m    Physical Exam Cardiovascular:     Rate and Rhythm: Normal rate and regular rhythm.  Pulmonary:     Effort: Pulmonary effort is normal.     Breath sounds: Normal breath sounds.  Skin:    General: Skin is warm and dry.     Findings: No rash.  Neurological:     Mental Status: She is alert.  Psychiatric:        Mood and Affect: Mood normal.     Musculoskeletal Exam:  Neck full ROM no tenderness Shoulders full ROM no tenderness or swelling Elbows full ROM no tenderness or swelling Wrists full ROM no tenderness or swelling Fingers full ROM no tenderness or swelling No paraspinal tenderness to palpation over upper and lower back Hip normal internal and external rotation without pain, no  tenderness to lateral hip palpation Knees full ROM no tenderness or swelling Ankles full ROM no tenderness or swelling MTPs full ROM no tenderness or swelling  Investigation: No additional findings.  Imaging: No results found.  Recent Labs: Lab Results  Component Value Date   WBC 9.2 10/10/2020   HGB 12.8 10/10/2020   PLT 477 (H) 10/10/2020   NA 139 10/10/2020   K 3.8 10/10/2020   CL 106 10/10/2020   CO2 27 10/10/2020   GLUCOSE 87 10/10/2020   BUN 13 10/10/2020   CREATININE 0.91 10/10/2020   BILITOT 0.7 10/10/2020   ALKPHOS 71 10/10/2020   AST 14 (L) 10/10/2020   ALT 16 10/10/2020   PROT 8.1 10/10/2020  ALBUMIN 3.9 10/10/2020   CALCIUM 9.1 10/10/2020   GFRAA >60 11/27/2019   QFTBGOLDPLUS NEGATIVE 01/11/2021    Speciality Comments: No specialty comments available.  Procedures:  No procedures performed Allergies: Eggs or egg-derived products and Lactose intolerance (gi)   Assessment / Plan:     Visit Diagnoses: Undifferentiated connective tissue disease ([place])  Currently doing well.  Recommend continuing hydroxychloroquine 400 mg p.o. daily.  Recommend trying to come off the prednisone decreased dose by half to 2.5 mg daily for at least 2 weeks and then try cessation.  If symptoms do not remain adequately controlled with removing steroids might be more beneficial to consider an alternative DMARD such as methotrexate.  Raynaud's phenomenon without gangrene  No new lesions flareups or worsening of symptoms..  High risk medication use - Plan: Hepatitis B core antibody, IgM, Hepatitis B surface antigen, Hepatitis C antibody, QuantiFERON-TB Gold Plus  Considering change of hydroxychloroquine for alternate DMARD we will check baseline labs for monitoring including hepatitis serology and TB screen.  Orders: Orders Placed This Encounter  Procedures   Hepatitis B core antibody, IgM   Hepatitis B surface antigen   Hepatitis C antibody   QuantiFERON-TB Gold Plus     No orders of the defined types were placed in this encounter.    Follow-Up Instructions: Return in about 3 months (around 04/13/2021).   Collier Salina, MD  Note - This record has been created using [employer].  Chart creation errors have been sought, but may not always  have been located. Such creation errors do not reflect on  the standard of medical care.

## 2021-01-11 ENCOUNTER — Ambulatory Visit (INDEPENDENT_AMBULATORY_CARE_PROVIDER_SITE_OTHER): Payer: 59 | Admitting: Pulmonary Disease

## 2021-01-11 ENCOUNTER — Encounter: Payer: Self-pay | Admitting: Internal Medicine

## 2021-01-11 ENCOUNTER — Ambulatory Visit (INDEPENDENT_AMBULATORY_CARE_PROVIDER_SITE_OTHER): Payer: 59 | Admitting: Internal Medicine

## 2021-01-11 ENCOUNTER — Encounter: Payer: Self-pay | Admitting: Pulmonary Disease

## 2021-01-11 ENCOUNTER — Other Ambulatory Visit: Payer: Self-pay

## 2021-01-11 VITALS — BP 125/73 | HR 74 | Resp 15 | Ht 69.0 in | Wt 224.0 lb

## 2021-01-11 VITALS — BP 142/80 | HR 64 | Temp 99.1°F | Ht 69.0 in | Wt 222.2 lb

## 2021-01-11 DIAGNOSIS — G4733 Obstructive sleep apnea (adult) (pediatric): Secondary | ICD-10-CM

## 2021-01-11 DIAGNOSIS — Z79899 Other long term (current) drug therapy: Secondary | ICD-10-CM

## 2021-01-11 DIAGNOSIS — Z7189 Other specified counseling: Secondary | ICD-10-CM | POA: Diagnosis not present

## 2021-01-11 DIAGNOSIS — I7300 Raynaud's syndrome without gangrene: Secondary | ICD-10-CM

## 2021-01-11 DIAGNOSIS — M359 Systemic involvement of connective tissue, unspecified: Secondary | ICD-10-CM

## 2021-01-11 NOTE — Patient Instructions (Addendum)
We can consider changing to a different medication for joint pain and inflammation if the hydroxychloroquine is still not working adequately. I would consider methotrexate for this, and will check baseline screening for this but do not see any current reasons why it would be prohibited.  I recommend trying to reduce the prednisone again incrementally decrease to 2.5 mg per day for 2 weeks then off and if still not tolerable we can discuss starting an alternative medicine, and would need to follow up sooner if that is the case.   Methotrexate Tablets What is this medication? METHOTREXATE (METH oh TREX ate) treats inflammatory conditions such as arthritis and psoriasis. It works by decreasing inflammation, which can reduce pain and prevent long-term injury to the joints and skin. It may also be used to treat some types of cancer. It works by slowing down the growth of cancercells. This medicine may be used for other purposes; ask your health care provider orpharmacist if you have questions. COMMON BRAND NAME(S): Rheumatrex, Trexall What should I tell my care team before I take this medication? They need to know if you have any of these conditions: Fluid in the stomach area or lungs If you often drink alcohol Infection or immune system problems Kidney disease or on hemodialysis Liver disease Low blood counts, like low white cell, platelet, or red cell counts Lung disease Radiation therapy Stomach ulcers Ulcerative colitis An unusual or allergic reaction to methotrexate, other medications, foods, dyes, or preservatives Pregnant or trying to get pregnant Breast-feeding How should I use this medication? Take this medication by mouth with a glass of water. Follow the directions on the prescription label. Take your medication at regular intervals. Do not take it more often than directed. Do not stop taking except on your care team'sadvice. Make sure you know why you are taking this medication and  how often you should take it. If this medication is used for a condition that is not cancer, like arthritis or psoriasis, it should be taken weekly, NOT daily. Taking thismedication more often than directed can cause serious side effects, even death. Talk to your care team about safe handling and disposal of this medication. Youmay need to take special precautions. Talk to your care team about the use of this medication in children. While thismedication may be prescribed for selected conditions, precautions do apply. Overdosage: If you think you have taken too much of this medicine contact apoison control center or emergency room at once. NOTE: This medicine is only for you. Do not share this medicine with others. What if I miss a dose? If you miss a dose, talk with your care team. Do not take double or extra doses. What may interact with this medication? Do not take this medication with any of the following: Acitretin This medication may also interact with the following: Aspirin and aspirin-like medications including salicylates Azathioprine Certain antibiotics like penicillins, tetracycline, and chloramphenicol Certain medications that treat or prevent blood clots like warfarin, apixaban, dabigatran, and rivaroxaban Certain medications for stomach problems like esomeprazole, omeprazole, pantoprazole Cyclosporine Dapsone Diuretics Gold Hydroxychloroquine Live virus vaccines Medications for infection like acyclovir, adefovir, amphotericin B, bacitracin, cidofovir, foscarnet, ganciclovir, gentamicin, pentamidine, vancomycin Mercaptopurine NSAIDs, medications for pain and inflammation, like ibuprofen or naproxen Other cytotoxic agents Pamidronate Pemetrexed Penicillamine Phenylbutazone Phenytoin Probenecid Pyrimethamine Retinoids such as isotretinoin and tretinoin Steroid medications like prednisone or cortisone Sulfonamides like sulfasalazine and  trimethoprim/sulfamethoxazole Theophylline Zoledronic acid This list may not describe all possible interactions. Give your health care  provider a list of all the medicines, herbs, non-prescription drugs, or dietary supplements you use. Also tell them if you smoke, drink alcohol, or use illegaldrugs. Some items may interact with your medicine. What should I watch for while using this medication? Avoid alcoholic drinks. This medication can make you more sensitive to the sun. Keep out of the sun. If you cannot avoid being in the sun, wear protective clothing and use sunscreen.Do not use sun lamps or tanning beds/booths. You may need blood work done while you are taking this medication. Call your care team for advice if you get a fever, chills or sore throat, or other symptoms of a cold or flu. Do not treat yourself. This medication decreases your body's ability to fight infections. Try to avoid being aroundpeople who are sick. This medication may increase your risk to bruise or bleed. Call your care teamif you notice any unusual bleeding. Be careful brushing or flossing your teeth or using a toothpick because you may get an infection or bleed more easily. If you have any dental work done, Primary school teacher you are receiving this medication. Check with your care team if you get an attack of severe diarrhea, nausea and vomiting, or if you sweat a lot. The loss of too much body fluid can make itdangerous for you to take this medication. Talk to your care team about your risk of cancer. You may be more at risk forcertain types of cancers if you take this medication. Do not become pregnant while taking this medication or for 6 months after stopping it. Women should inform their care team if they wish to become pregnant or think they might be pregnant. Men should not father a child while taking this medication and for 3 months after stopping it. There is potential for serious harm to an unborn child. Talk to your  care team for more information. Do not breast-feed an infant while taking this medication or for 1week after stopping it. This medication may make it more difficult to get pregnant or father a child.Talk to your care team if you are concerned about your fertility. What side effects may I notice from receiving this medication? Side effects that you should report to your care team as soon as possible: Allergic reactions-skin rash, itching, hives, swelling of the face, lips, tongue, or throat Blood clot-pain, swelling, or warmth in the leg, shortness of breath, chest pain Dry cough, shortness of breath or trouble breathing Infection-fever, chills, cough, sore throat, wounds that don't heal, pain or trouble when passing urine, general feeling of discomfort or being unwell Kidney injury-decrease in the amount of urine, swelling of the ankles, hands, or feet Liver injury-right upper belly pain, loss of appetite, nausea, light-colored stool, dark yellow or brown urine, yellowing of the skin or eyes, unusual weakness or fatigue Low red blood cell count-unusual weakness or fatigue, dizziness, headache, trouble breathing Redness, blistering, peeling, or loosening of the skin, including inside the mouth Seizures Unusual bruising or bleeding Side effects that usually do not require medical attention (report to your careteam if they continue or are bothersome): Diarrhea Dizziness Hair loss Nausea Pain, redness, or swelling with sores inside the mouth or throat Vomiting This list may not describe all possible side effects. Call your doctor for medical advice about side effects. You may report side effects to FDA at1-800-FDA-1088. Where should I keep my medication? Keep out of the reach of children and pets. Store at room temperature between 20 and 25 degrees C (68  and 77 degrees F).Protect from light. Get rid of any unused medication after the expiration date. Talk to your care team about how to dispose  of unused medication. Specialdirections may apply. NOTE: This sheet is a summary. It may not cover all possible information. If you have questions about this medicine, talk to your doctor, pharmacist, orhealth care provider.  2022 Elsevier/Gold Standard (2020-06-07 15:55:45)

## 2021-01-11 NOTE — Patient Instructions (Signed)
Will arrange for new auto CPAP set up  Follow up in 4 months 

## 2021-01-11 NOTE — Progress Notes (Signed)
[facility]  Chief Complaint  Patient presents with   Follow-up    F/u on sleep study     Constitutional:  BP (!) 142/80 (BP Location: Left Arm, Patient Position: Sitting, Cuff Size: Large)   Pulse 64   Temp 99.1 F (37.3 C) (Oral)   Ht '5\' 9"'$  (1.753 m)   Wt 222 lb 3.2 oz (100.8 kg)   SpO2 98%   BMI 32.81 kg/m   Past Medical History:  GERD, Allergies, HLD, Lactose intolerance, Thrombotic thrombocytopenic purpura, Positive ANA  Past Surgical History:  She  has a past surgical history that includes Splenectomy (1987); Abdominal hysterectomy (2006); Knee arthroscopy (Right); and Nasal septum surgery.  Brief Summary:  Deanna [name] is a 60 y.o. female with obstructive sleep apnea.      Subjective:   Her husband was present on the phone during a portion of the interview.  She had sleep study in July.  Showed mild to moderate sleep apnea.  She continues to feel sleepy and tired during the day.  She had to go up on her prednisone dose recently.  As a result her weight has been going up.   Physical Exam:   Appearance - well kempt   ENMT - no sinus tenderness, no oral exudate, no LAN, Mallampati 4 airway, no stridor, scalloped tongue, high arched palate  Respiratory - equal breath sounds bilaterally, no wheezing or rales  CV - s1s2 regular rate and rhythm, no murmurs  Ext - no clubbing, no edema  Skin - no rashes  Psych - normal mood and affect    Sleep Tests:  PSG 11/26/20 >> AHI 13.7, SpO2 low 87%  Cardiac Tests:  Echo 07/28/16 >> EF 60 to 65%, grade 1 DD, mild LVH, mild AR, aortic root 38 mm  Social History:  She  reports that she has never smoked. She has never used smokeless tobacco. She reports that she does not drink alcohol and does not use drugs.  Family History:  Her family history includes Arthritis in her mother; Breast cancer in her daughter; Hyperlipidemia in her mother; Hypertension in her  father and mother; Lupus in her maternal aunt and son; Stroke in her mother.     Assessment/Plan:   Obstructive sleep apnea. - reviewed her sleep study results - discussed how sleep apnea can impact her health - reviewed treatment options - will arrange for auto CPAP set up with range 5 to 15 cm H2O  Positive ANA likely from SLE. - followed by Dr. Vernelle Emerald with [facility]  Obesity. - hopefully she can transition off prednisone soon and then work on weight reduction  Time Spent Involved in Patient Care on Day of Examination:  24 minutes  Follow up:   Patient Instructions  Will arrange for new auto CPAP set up  Follow up in 4 months  Medication List:   Allergies as of 01/11/2021       Reactions   Eggs Or Egg-derived Products Nausea And Vomiting   Lactose Intolerance (gi) Other (See Comments)   vomiting        Medication List        Accurate as of January 11, 2021  1:12 PM. If you have any questions, ask your nurse or doctor.          ALEVE PO Take by mouth as needed.   Cholecalciferol 100 MCG (4000 UT) Caps Take by mouth daily.   clopidogrel 75 MG tablet  Commonly known as: PLAVIX Take 1 tablet (75 mg total) by mouth daily.   hydroxychloroquine 200 MG tablet Commonly known as: PLAQUENIL TAKE 2 TABLETS BY MOUTH EVERY DAY   IRON PO Take by mouth.   predniSONE 5 MG tablet Commonly known as: DELTASONE Take 1 tablet (5 mg total) by mouth daily as needed.        Signature:  Chesley Mires, MD [place] Pager - [phone] 01/11/2021, 1:12 PM

## 2021-01-15 LAB — HEPATITIS B SURFACE ANTIGEN: Hepatitis B Surface Ag: NONREACTIVE

## 2021-01-15 LAB — HEPATITIS C ANTIBODY
Hepatitis C Ab: NONREACTIVE
SIGNAL TO CUT-OFF: 0.01 (ref <1.00)

## 2021-01-15 LAB — QUANTIFERON-TB GOLD PLUS
Mitogen-NIL: 3.34 IU/mL
NIL: 0.03 IU/mL
QuantiFERON-TB Gold Plus: NEGATIVE
TB1-NIL: 0.02 IU/mL
TB2-NIL: 0.03 IU/mL

## 2021-01-15 LAB — HEPATITIS B CORE ANTIBODY, IGM: Hep B C IgM: NONREACTIVE

## 2021-01-21 ENCOUNTER — Other Ambulatory Visit: Payer: Self-pay | Admitting: Neurology

## 2021-01-21 DIAGNOSIS — R200 Anesthesia of skin: Secondary | ICD-10-CM

## 2021-01-24 ENCOUNTER — Telehealth: Payer: Self-pay | Admitting: Neurology

## 2021-01-24 NOTE — Telephone Encounter (Signed)
Patient called and said her Plavix needs prior authorization.

## 2021-01-25 ENCOUNTER — Other Ambulatory Visit: Payer: Self-pay | Admitting: Internal Medicine

## 2021-01-27 NOTE — Telephone Encounter (Signed)
F/u  Corliss Skains Key: [id number] help? Call us at [phone]  Outcome Additional Information Required  Drug is covered by current benefit plan. No further PA activity needed  Drug Plavix '75MG'$  tablets  Museum/gallery curator PA Form [phone] NCPDP)

## 2021-02-01 ENCOUNTER — Telehealth: Payer: Self-pay | Admitting: Family Medicine

## 2021-02-01 NOTE — Telephone Encounter (Signed)
Pt has an appointment.  

## 2021-02-01 NOTE — Telephone Encounter (Signed)
Patient thinks she may have sinus infection and wants to know if a prescription can be sent in to help or if there is anything she can take to help with symptoms    Good callback number is [phone]

## 2021-02-03 ENCOUNTER — Telehealth (INDEPENDENT_AMBULATORY_CARE_PROVIDER_SITE_OTHER): Payer: 59 | Admitting: Family Medicine

## 2021-02-03 ENCOUNTER — Encounter: Payer: Self-pay | Admitting: Family Medicine

## 2021-02-03 DIAGNOSIS — J069 Acute upper respiratory infection, unspecified: Secondary | ICD-10-CM | POA: Diagnosis not present

## 2021-02-03 DIAGNOSIS — J0100 Acute maxillary sinusitis, unspecified: Secondary | ICD-10-CM

## 2021-02-03 MED ORDER — AMOXICILLIN 500 MG PO TABS: 500.0000 mg | ORAL_TABLET | Freq: Two times a day (BID) | ORAL | 0 refills | Status: AC

## 2021-02-03 NOTE — Progress Notes (Signed)
Virtual Visit via Video Note  I connected with Deanna [name] on 02/03/21 at  9:30 AM EDT by a video enabled telemedicine application 2/2 [id number] pandemic and verified that I am speaking with the correct person using two identifiers.  Location patient: home Location provider:work or home office Persons participating in the virtual visit: patient, provider  I discussed the limitations of evaluation and management by telemedicine and the availability of in person appointments. The patient expressed understanding and agreed to proceed.   HPI: Pt went to a football game Friday night.  On Saturday 9/17 patient developed sore throat, eye and nose pain/pressure, rhinorrhea, HA-resolved Sat, cough. Decreased appetite. Robitussin, gargling with warm salt water, tea with lemon.  Patient works in a daycare.  Patient denies loss of taste or smell.  Fully vaccinated and boosted.  ROS: See pertinent positives and negatives per HPI.  Past Medical History:  Diagnosis Date   Allergy    GERD (gastroesophageal reflux disease)    "does not need meds anymore"   Heart murmur    Hyperlipidemia    Lactose intolerance    OSA (obstructive sleep apnea)    Positive ANA (antinuclear antibody)    TTP (thrombotic thrombocytopenic purpura)     Past Surgical History:  Procedure Laterality Date   ABDOMINAL HYSTERECTOMY  2006   benign tumor, complete   KNEE ARTHROSCOPY Right    NASAL SEPTUM SURGERY     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    Family History  Problem Relation Age of Onset   Arthritis Mother    Hyperlipidemia Mother    Stroke Mother    Hypertension Mother    Hypertension Father    Lupus Son    Breast cancer Daughter    Lupus Maternal Aunt    Colon cancer Neg Hx    Social: Patient works in a daycare.  Current Outpatient Medications:    Cholecalciferol 100 MCG (4000 UT) CAPS, Take by mouth daily., Disp: , Rfl:    clopidogrel (PLAVIX) 75 MG tablet, TAKE 1 TABLET BY MOUTH EVERY  DAY, Disp: 90 tablet, Rfl: 0   Ferrous Sulfate (IRON PO), Take by mouth., Disp: , Rfl:    hydroxychloroquine (PLAQUENIL) 200 MG tablet, TAKE 2 TABLETS BY MOUTH EVERY DAY, Disp: 60 tablet, Rfl: 1   Naproxen Sodium (ALEVE PO), Take by mouth as needed., Disp: , Rfl:    predniSONE (DELTASONE) 5 MG tablet, TAKE 1 TABLET BY MOUTH DAILY AS NEEDED., Disp: 30 tablet, Rfl: 0  EXAM:  VITALS per patient if applicable: RR between 53-64 bpm  GENERAL: alert, oriented, appears nontoxic with hoarse voice, and in no acute distress  HEENT: atraumatic, conjunctiva clear, no obvious abnormalities on inspection of external nose and ears  NECK: normal movements of the head and neck  LUNGS: on inspection no signs of respiratory distress, breathing rate appears normal, no obvious gross SOB, gasping or wheezing  CV: no obvious cyanosis  MS: moves all visible extremities without noticeable abnormality  PSYCH/NEURO: pleasant and cooperative, no obvious depression or anxiety, speech and thought processing grossly intact  ASSESSMENT AND PLAN:  Discussed the following assessment and plan:  Viral URI with cough -Discussed possible causes including acute nasopharyngitis, influenza, COVID-19 -Patient encouraged to take COVID test -Continue supportive care including rest, hydration, OTC cough/cold medication, steam from shower, Tylenol as needed -Given strict precautions -Continue Flonase and gargling with warm salt water  Acute maxillary sinusitis, recurrence not specified  -Given pressure/discomfort in face discussed wait-and-see prescription  for sinusitis -Patient to continue supportive care including Flonase and take COVID test -For continued symptoms over the weekend pick up Rx for amoxicillin - Plan: amoxicillin (AMOXIL) 500 MG tablet  Follow-up as needed   I discussed the assessment and treatment plan with the patient. The patient was provided an opportunity to ask questions and all were answered. The  patient agreed with the plan and demonstrated an understanding of the instructions.   The patient was advised to call back or seek an in-person evaluation if the symptoms worsen or if the condition fails to improve as anticipated.   Billie Ruddy, MD

## 2021-02-17 ENCOUNTER — Encounter: Payer: Self-pay | Admitting: Family Medicine

## 2021-02-18 ENCOUNTER — Other Ambulatory Visit: Payer: Self-pay

## 2021-02-18 ENCOUNTER — Emergency Department (HOSPITAL_BASED_OUTPATIENT_CLINIC_OR_DEPARTMENT_OTHER)
Admission: EM | Admit: 2021-02-18 | Discharge: 2021-02-18 | Disposition: A | Discharge status: Home / Self Care | Payer: 59 | Attending: Emergency Medicine | Admitting: Emergency Medicine

## 2021-02-18 ENCOUNTER — Encounter (HOSPITAL_BASED_OUTPATIENT_CLINIC_OR_DEPARTMENT_OTHER): Payer: Self-pay | Admitting: *Deleted

## 2021-02-18 DIAGNOSIS — R0981 Nasal congestion: Secondary | ICD-10-CM | POA: Insufficient documentation

## 2021-02-18 DIAGNOSIS — R630 Anorexia: Secondary | ICD-10-CM | POA: Diagnosis not present

## 2021-02-18 DIAGNOSIS — Z20822 Contact with and (suspected) exposure to covid-19: Secondary | ICD-10-CM | POA: Diagnosis not present

## 2021-02-18 DIAGNOSIS — J029 Acute pharyngitis, unspecified: Secondary | ICD-10-CM | POA: Diagnosis not present

## 2021-02-18 DIAGNOSIS — R059 Cough, unspecified: Secondary | ICD-10-CM | POA: Insufficient documentation

## 2021-02-18 DIAGNOSIS — R0982 Postnasal drip: Secondary | ICD-10-CM | POA: Insufficient documentation

## 2021-02-18 DIAGNOSIS — H9202 Otalgia, left ear: Secondary | ICD-10-CM | POA: Insufficient documentation

## 2021-02-18 LAB — RESP PANEL BY RT-PCR (FLU A&B, COVID) ARPGX2
Influenza A by PCR: NEGATIVE
Influenza B by PCR: NEGATIVE
SARS Coronavirus 2 by RT PCR: NEGATIVE

## 2021-02-18 LAB — GROUP A STREP BY PCR: Group A Strep by PCR: NOT DETECTED

## 2021-02-18 MED ORDER — DEXAMETHASONE 4 MG PO TABS
6.0000 mg | ORAL_TABLET | Freq: Once | ORAL | Status: AC
  Administered 2021-02-18: 6 mg via ORAL
  Filled 2021-02-18: qty 2

## 2021-02-18 NOTE — ED Provider Notes (Signed)
[facility] EMERGENCY DEPT Provider Note   CSN: [id number] Arrival date & time: 02/18/21  1559     History Chief Complaint  Patient presents with   Sore Throat   Otalgia    Deanna [name] is a 60 y.o. female.   Sore Throat Pertinent negatives include no chest pain, no abdominal pain, no headaches and no shortness of breath.  Otalgia Associated symptoms: congestion and sore throat   Associated symptoms: no abdominal pain, no cough, no diarrhea, no fever, no headaches, no hearing loss, no neck pain, no rash, no rhinorrhea and no vomiting   Patient presents for persistent sore throat.  3 weeks ago, she went to nighttime football game.  She thinks the night air may have caused her to lose her voice and developed some throat pain.  She contacted her primary doctor who called her in an antibiotic prescription.  She believes that it was amoxicillin, 500 mg twice daily for 7 days.  She finished his course of antibiotics.  Since she finished the antibiotics, 2 weeks ago, she has had persistent loss of voice, sore throat, odynophagia, sinus pressure, and left ear pain.  She is also experienced some pain at the right TMJ, worsened with opening her mouth.  She has not had any fevers.  Today, she did feel some chills.  When she lays flat, she has worsening of her sore throat and cough.  She attributes this to postnasal drip.  With her sinus congestion, she has had white mucus only.  She has tried cough medicine, honey, salt water gargles, all with minimal relief.  She has had decreased appetite and has been hesitant to swallow solid foods.  She has been consuming ice cream and cold drinks.  She presents to the ED due to persistent symptoms.    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   Aortic root dilatation ([place]) 08/07/2016   Ear pain 07/22/2016   Hyperlipemia 12/02/2015   H/O splenectomy 12/02/2015   Hyperglycemia 01/27/2014   Obesity 01/27/2014   Vitamin D deficiency 09/11/2012    Past Surgical History:  Procedure Laterality Date   ABDOMINAL HYSTERECTOMY  2006   benign tumor, complete   KNEE ARTHROSCOPY Right    NASAL SEPTUM SURGERY     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     OB History   No obstetric history on file.     Family History  Problem Relation Age of Onset   Arthritis Mother    Hyperlipidemia Mother    Stroke Mother    Hypertension Mother    Hypertension Father    Lupus Son  Breast cancer Daughter    Lupus Maternal Aunt    Colon cancer Neg Hx     Social History   Tobacco Use   Smoking status: Never   Smokeless tobacco: Never  Vaping Use   Vaping Use: Never used  Substance Use Topics   Alcohol use: No   Drug use: No    Home Medications Prior to Admission medications   Medication Sig Start Date End Date Taking? Authorizing Provider  Cholecalciferol 100 MCG (4000 UT) CAPS Take by mouth daily.    [provider]  clopidogrel (PLAVIX) 75 MG tablet TAKE 1 TABLET BY MOUTH EVERY DAY 01/24/21   Cameron Sprang, MD  Ferrous Sulfate (IRON PO) Take by mouth.    [provider]  hydroxychloroquine  (PLAQUENIL) 200 MG tablet TAKE 2 TABLETS BY MOUTH EVERY DAY 01/06/21   Rice, Resa Miner, MD  Naproxen Sodium (ALEVE PO) Take by mouth as needed.    [provider]  predniSONE (DELTASONE) 5 MG tablet TAKE 1 TABLET BY MOUTH DAILY AS NEEDED. 01/25/21   Rice, Resa Miner, MD    Allergies    Eggs or egg-derived products and Lactose intolerance (gi)  Review of Systems   Review of Systems  Constitutional:  Negative for activity change, appetite change, chills, fatigue and fever.  HENT:  Positive for congestion, ear pain, sinus pressure, sore throat and voice change. Negative for drooling, facial swelling, hearing loss and rhinorrhea.   Eyes:  Negative for pain and visual disturbance.  Respiratory:  Negative for cough, chest tightness and shortness of breath.   Cardiovascular:  Negative for chest pain and palpitations.  Gastrointestinal:  Negative for abdominal pain, diarrhea, nausea and vomiting.  Genitourinary:  Negative for dysuria and hematuria.  Musculoskeletal:  Negative for arthralgias, back pain and neck pain.  Skin:  Negative for color change and rash.  Neurological:  Negative for dizziness, seizures, syncope, weakness, light-headedness, numbness and headaches.  All other systems reviewed and are negative.  Physical Exam Updated Vital Signs BP (!) 161/94   Pulse 66   Temp 98.3 F (36.8 C)   Resp 20   Ht 5\' 9"  (1.753 m)   Wt 99.8 kg   SpO2 95%   BMI 32.49 kg/m   Physical Exam Vitals and nursing note reviewed.  Constitutional:      General: She is not in acute distress.    Appearance: She is well-developed and normal weight. She is not ill-appearing, toxic-appearing or diaphoretic.  HENT:     Head: Normocephalic and atraumatic.     Right Ear: Tympanic membrane and ear canal normal. Tympanic membrane is not erythematous.     Left Ear: Tympanic membrane and ear canal normal. Tympanic membrane is not erythematous.     Nose: Congestion present. No rhinorrhea.      Mouth/Throat:     Mouth: Mucous membranes are moist.     Pharynx: Oropharynx is clear. Uvula midline. No oropharyngeal exudate or posterior oropharyngeal erythema.     Tonsils: No tonsillar exudate or tonsillar abscesses.  Eyes:     Conjunctiva/sclera: Conjunctivae normal.  Cardiovascular:     Rate and Rhythm: Normal rate and regular rhythm.     Heart sounds: No murmur heard. Pulmonary:     Effort: Pulmonary effort is normal. No respiratory distress.     Breath sounds: Normal breath sounds. No wheezing or rales.  Abdominal:     Palpations: Abdomen is soft.     Tenderness: There is no abdominal tenderness.  Musculoskeletal:     Cervical back: Normal range of motion and neck supple.  Skin:    General: Skin is warm and dry.     Capillary Refill: Capillary refill takes less than 2 seconds.  Neurological:     General: No focal deficit present.     Mental Status: She is alert and oriented to person, place, and time.  Psychiatric:        Mood and Affect: Mood normal.        Behavior: Behavior normal.    ED Results / Procedures / Treatments   Labs (all labs ordered are listed, but only abnormal results are displayed) Labs Reviewed  RESP PANEL BY RT-PCR (FLU A&B, COVID) [id number]  GROUP A STREP BY PCR    EKG None  Radiology No results found.  Procedures Procedures   Medications Ordered in ED Medications  dexamethasone (DECADRON) tablet 6 mg (6 mg Oral Given 02/18/21 2138)    ED Course  I have reviewed the triage vital signs and the nursing notes.  Pertinent labs & imaging results that were available during my care of the patient were reviewed by me and considered in my medical decision making (see chart for details).    MDM Rules/Calculators/A&P                          Patient presents for sore throat and left ear pain.  Sore throat has been present over the past 3 weeks.  She did undergo a 7-day course of amoxicillin.  She denies systemic symptoms.  She is afebrile and  well-appearing upon arrival.  Vital signs are notable for hypertension.  On exam, oropharynx is clear without any evidence of erythema or swelling.  Uvula is midline.  On otoscope examination of her ears, no evidence of TM erythema or bulging.  Canal is normal in appearance.  Patient has no cervical lymphadenopathy and no associated tenderness.  COVID, flu, and strep testing were all negative.  Patient was informed of these results.  On further discussion, patient states that she has worsening symptoms of sore throat in the mornings.  She does snore and does have a sleep study scheduled.  Etiology of her symptoms could be secondary to snoring at night and/or postnasal drip and/or viral reinfection.  Given no evidence of bacterial infection at this time, no antibiotics are indicated.  Patient was given dose of Decadron for symptomatic relief of her sore throat.  She is also advised to continue ibuprofen and/or Tylenol.  She was advised to follow-up with primary care doctor and to return to the ED if symptoms worsen.  She was discharged in good condition.  Final Clinical Impression(s) / ED Diagnoses Final diagnoses:  Sore throat    Rx / DC Orders ED Discharge Orders     None        Godfrey Pick, MD 02/20/21 1322

## 2021-02-18 NOTE — ED Triage Notes (Signed)
Sore throat x 3 weeks with left ear pain and pain in rt jaw when trying to chew. Pt has completed antibiotics with continued symptoms

## 2021-02-18 NOTE — ED Notes (Signed)
Pt verbalizes understanding of discharge instructions. Opportunity for questioning and answers were provided. Armand removed by staff, pt discharged from ED to home. Educated to f/u with PCP.  

## 2021-02-21 ENCOUNTER — Other Ambulatory Visit: Payer: Self-pay | Admitting: Internal Medicine

## 2021-02-25 ENCOUNTER — Other Ambulatory Visit: Payer: Self-pay

## 2021-02-25 ENCOUNTER — Encounter: Payer: Self-pay | Admitting: Family Medicine

## 2021-02-25 ENCOUNTER — Ambulatory Visit (INDEPENDENT_AMBULATORY_CARE_PROVIDER_SITE_OTHER): Payer: 59 | Admitting: Family Medicine

## 2021-02-25 VITALS — BP 142/82 | HR 64 | Temp 98.5°F | Wt 224.8 lb

## 2021-02-25 DIAGNOSIS — J029 Acute pharyngitis, unspecified: Secondary | ICD-10-CM | POA: Diagnosis not present

## 2021-02-25 DIAGNOSIS — H66002 Acute suppurative otitis media without spontaneous rupture of ear drum, left ear: Secondary | ICD-10-CM | POA: Diagnosis not present

## 2021-02-25 DIAGNOSIS — R0982 Postnasal drip: Secondary | ICD-10-CM | POA: Diagnosis not present

## 2021-02-25 MED ORDER — FEXOFENADINE HCL 180 MG PO TABS: 180.0000 mg | ORAL_TABLET | Freq: Every day | ORAL | 0 refills | Status: DC

## 2021-02-25 MED ORDER — AZITHROMYCIN 250 MG PO TABS: ORAL_TABLET | ORAL | 0 refills | Status: AC

## 2021-02-25 NOTE — Progress Notes (Signed)
Subjective:    Patient ID: Deanna [name], female    DOB: 1961/02/10, 60 y.o.   MRN: [id number]  Chief Complaint  Patient presents with   Follow-up    Sore throat    HPI Patient is a 60 year old female with pmh sig for seasonal allergies, history of GERD, HLD, OSA, TTP who was seen today for ongoing concern.  Patient endorses continued sore throat since September.  Seen virtually 9/22/2 for URI and sinusitis.  Given Amoxicillin. Seen by ED on 02/18/2021 for continued symptoms, respiratory panel and strep negative.  Given steroid injection.  Pt endorses continued sore throat, left ear pain, nasal drainage.  Tried cough drops, Robitussin, Afrin, nasal rinse.  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  Patient denies headaches, facial pain/pressure, fever, dental pain/pressure, acid reflux.  Past Medical History:  Diagnosis Date   Allergy    GERD (gastroesophageal reflux disease)    "does not need meds anymore"   Heart murmur    Hyperlipidemia    Lactose intolerance    OSA (obstructive sleep apnea)    Positive ANA (antinuclear antibody)    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[facility])     Allergies  Allergen Reactions   Eggs Or Egg-Derived Products Nausea And Vomiting   Lactose Intolerance (Gi) Other (See Comments)    vomiting    ROS General: Denies fever, chills, night sweats, changes in weight, changes in appetite HEENT: Denies headaches, changes in vision, rhinorrhea  +sore throat, L ear pain, nasal drianage CV: Denies CP, palpitations, SOB, orthopnea Pulm: Denies SOB, cough, wheezing GI: Denies abdominal pain, nausea, vomiting, diarrhea, constipation GU: Denies dysuria, hematuria, frequency Msk: Denies muscle cramps, joint pains Neuro: Denies weakness, numbness, tingling Skin: Denies rashes, bruising Psych: Denies depression, anxiety, hallucinations  Objective:    Blood pressure (!) 142/82, pulse 64, temperature 98.5 F (36.9  C), temperature source Oral, weight 224 lb 12.8 oz (102 kg), SpO2 95 %.  Gen. Pleasant, well-nourished, in no distress, normal affect   HEENT: [place]/AT, face symmetric, conjunctiva clear, no scleral icterus, PERRLA, EOMI, nares patent without drainage, pharynx without erythema or exudate.  R TM flat and dull, L TM full with suppurative fluid, erythematous.  Mild cervical lymphadenopathy Lungs: no accessory muscle use, CTAB, no wheezes or rales Cardiovascular: RRR, no m/r/g, no peripheral edema Musculoskeletal: No deformities, no cyanosis or clubbing, normal tone Neuro:  A&Ox3, CN II-XII intact, normal gait Skin:  Warm, no lesions/ rash   Wt Readings from Last 3 Encounters:  02/25/21 224 lb 12.8 oz (102 kg)  02/18/21 220 lb (99.8 kg)  01/11/21 224 lb (101.6 kg)    Lab Results  Component Value Date   WBC 9.2 10/10/2020   HGB 12.8 10/10/2020   HCT 40.0 10/10/2020   PLT 477 (H) 10/10/2020   GLUCOSE 87 10/10/2020   CHOL 198 06/09/2020   TRIG 65.0 06/09/2020   HDL 46.80 06/09/2020   LDLDIRECT 150.4 10/04/2012   LDLCALC 138 (H) 06/09/2020   ALT 16 10/10/2020   AST 14 (L) 10/10/2020   NA 139 10/10/2020   K 3.8 10/10/2020   CL 106 10/10/2020   CREATININE 0.91 10/10/2020   BUN 13 10/10/2020   CO2 27 10/10/2020   TSH 1.10 06/09/2020   INR 1.0 10/10/2020   HGBA1C 6.2 06/09/2020    Assessment/Plan:  Acute suppurative otitis media of left ear without spontaneous rupture of tympanic membrane, recurrence not specified - Plan: azithromycin (ZITHROMAX) 250 MG tablet  Sore throat - Plan: fexofenadine (ALLEGRA) 180 MG tablet  Post-nasal drainage - Plan: fexofenadine (ALLEGRA) 180 MG tablet   Start azithromycin for L AOM.  Continue supportive care including Tylenol or ibuprofen as needed for pain/discomfort.  Advised sore throat likely 2/2 postnasal drainage.  Start Allegra daily.  Continue gargling with warm salt water or Chloraseptic spray.  Okay to use Nettie pot or Flonase as needed.   Would advise against consistent use of Afrin.  Also consider silent reflux or polyp for continued sore throat symptoms.  F/u as needed  Grier Mitts, MD

## 2021-03-19 ENCOUNTER — Other Ambulatory Visit: Payer: Self-pay | Admitting: Family Medicine

## 2021-03-19 DIAGNOSIS — J029 Acute pharyngitis, unspecified: Secondary | ICD-10-CM

## 2021-03-19 DIAGNOSIS — R0982 Postnasal drip: Secondary | ICD-10-CM

## 2021-03-22 ENCOUNTER — Encounter: Payer: Self-pay | Admitting: Neurology

## 2021-03-22 ENCOUNTER — Other Ambulatory Visit: Payer: Self-pay

## 2021-03-22 ENCOUNTER — Ambulatory Visit (INDEPENDENT_AMBULATORY_CARE_PROVIDER_SITE_OTHER): Payer: 59 | Admitting: Neurology

## 2021-03-22 VITALS — BP 142/81 | HR 72 | Ht 69.0 in | Wt 229.4 lb

## 2021-03-22 DIAGNOSIS — R42 Dizziness and giddiness: Secondary | ICD-10-CM | POA: Diagnosis not present

## 2021-03-22 DIAGNOSIS — R202 Paresthesia of skin: Secondary | ICD-10-CM

## 2021-03-22 DIAGNOSIS — Z8673 Personal history of transient ischemic attack (TIA), and cerebral infarction without residual deficits: Secondary | ICD-10-CM

## 2021-03-22 DIAGNOSIS — M5412 Radiculopathy, cervical region: Secondary | ICD-10-CM

## 2021-03-22 DIAGNOSIS — R200 Anesthesia of skin: Secondary | ICD-10-CM

## 2021-03-22 MED ORDER — CLOPIDOGREL BISULFATE 75 MG PO TABS: 75.0000 mg | ORAL_TABLET | Freq: Every day | ORAL | 3 refills | Status: DC

## 2021-03-22 NOTE — Patient Instructions (Signed)
Continue Plavix daily, as well as control of blood pressure, cholesterol, sugar levels for stroke prevention. Follow-up as needed, call for any changes.

## 2021-03-22 NOTE — Progress Notes (Signed)
NEUROLOGY FOLLOW UP OFFICE NOTE  Deanna [name] [id number] Jan 03, 1961  HISTORY OF PRESENT ILLNESS: I had the pleasure of seeing Deanna [name] in follow-up in the neurology clinic on 03/22/2021.  The patient was last seen 8 months ago for left cervical radiculopathy. She was initially seen in 2018 for episodes of vertigo. She called our office to report another episode of vertigo last 10/2020. She reports unstable gait, veering to the right. She also noted an increase in headaches and left-sided partial numbness. She had a brain MRI without contrast which I personally reviewed, no acute changes. 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 She was referred for vestibular rehab but she did not proceed because the vertigo resolved. She denies any further dizziness, her left arm is better, it is not bothering her as much. She is no longer taking Pregabalin. She has headaches off and on which she attributes to allergies as they are in the nasal region. She is sleeping better with a CPAP machine. She is on Plavix for secondary stroke prevention. BP today 142/81. Her mother passed away recently. 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 She lives with her husband. She continues to work with her rheumatologist on the RA, taking Plaquenil.    History on Initial Assessment 07/20/2016: This is a pleasant 60 yo RH woman with a history of hyperlipidemia who reported a 3-year history of intermittent episodes of dizziness described as spinning, as well as feeling lightheaded with blurred vision lasting 20-30 seconds. Recently she started having different symptoms, around 2 weeks ago she started getting carsick, which is new for her. A week ago, her left hand was numb, "like it was not there," then 20-30 seconds later it started tingling. Face and leg were unaffected. She did not have any neck pain at that time, but 3 days  later started having a headache with pressure over the frontal regions that she ascribed to her sinuses. She started having pretty significant intermittent nausea. She continued to have dizziness, last episode was a week ago. The headaches have resolved. Blurred vision still comes and goes. She denies any diplopia, dysarthria/dysphagia, back pain, bowel/bladder dysfunction, negative Lhermitte sign. She denies any prior history of focal symptoms, no prior history of vision loss. She denies any head injuries. 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 There is a family history of stroke.   She had an MRI brain with and without contrast last 07/18/2016 which I personally reviewed, there was a faint punctate focus of reduced diffusion in the right frontal lobe deep white matter, difficult to see on the ADC map due to small size, this can be seen with acute ischemia or hyperacute demyelination. There were at least 5 additional subcentimeter white matter, right parietal, medial left thalamus, and left occipital horn FLAIR hyperintensities. There was mild prominence of the ventricles and sulci for age. No abnormal enhancement seen. 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    PAST MEDICAL HISTORY: Past Medical History:  Diagnosis Date   Allergy    GERD (gastroesophageal reflux disease)    "does not need meds anymore"   Heart murmur    Hyperlipidemia    Lactose intolerance    OSA (obstructive sleep apnea)    Positive ANA (antinuclear antibody)    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HCC)     MEDICATIONS: Current Outpatient  Medications on File Prior to Visit  Medication Sig Dispense Refill   Cholecalciferol 100 MCG (4000 UT) CAPS Take by mouth daily.     clopidogrel (PLAVIX) 75 MG tablet TAKE 1 TABLET BY MOUTH EVERY DAY 90 tablet 0   Ferrous Sulfate (IRON PO) Take by mouth.     fexofenadine (ALLEGRA) 180 MG tablet  TAKE 1 TABLET BY MOUTH EVERY DAY 30 tablet 2   hydroxychloroquine (PLAQUENIL) 200 MG tablet TAKE 2 TABLETS BY MOUTH EVERY DAY 60 tablet 1   Naproxen Sodium (ALEVE PO) Take by mouth as needed.     predniSONE (DELTASONE) 5 MG tablet TAKE 1 TABLET BY MOUTH EVERY DAY AS NEEDED 30 tablet 0   No current facility-administered medications on file prior to visit.    ALLERGIES: Allergies  Allergen Reactions   Eggs Or Egg-Derived Products Nausea And Vomiting   Lactose Intolerance (Gi) Other (See Comments)    vomiting    FAMILY HISTORY: Family History  Problem Relation Age of Onset   Arthritis Mother    Hyperlipidemia Mother    Stroke Mother    Hypertension Mother    Hypertension Father    Lupus Son    Breast cancer Daughter    Lupus Maternal Aunt    Colon cancer Neg Hx     SOCIAL HISTORY: Social History   Socioeconomic History   Marital status: Married    Spouse name: Not on file   Number of children: Not on file   Years of education: Not on file   Highest education level: Not on file  Occupational History   Not on file  Tobacco Use   Smoking status: Never   Smokeless tobacco: Never  Vaping Use   Vaping Use: Never used  Substance and Sexual Activity   Alcohol use: No   Drug use: No   Sexual activity: Not on file  Other Topics Concern   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: lives 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, husband and mother      Spiritual Beliefs: Christian      Lifestyle: walks a little a few days per week, diet is ok      Right Handed      Drinks Caffeine         Social Determinants of Health   Financial Resource Strain: Not on file  Food Insecurity: Not on file  Transportation Needs: Not on file  Physical Activity: Not on file  Stress: Not on file  Social Connections: Not on file  Intimate Partner Violence: Not on file     PHYSICAL EXAM: Vitals:   03/22/21 1551  BP: (!) 142/81   Pulse: 72  SpO2: 99%   General: No acute distress Head:  Normocephalic/atraumatic Skin/Extremities: No rash, no edema Neurological Exam: alert and awake. No aphasia or dysarthria. Fund of knowledge is appropriate.   Attention and concentration are normal.   Cranial nerves: Pupils equal, round. Extraocular movements intact with no nystagmus. Visual fields full.  No facial asymmetry.  Motor: Bulk and tone normal, muscle strength 5/5 throughout with no pronator drift.   Finger to nose testing intact.  Gait narrow-based and steady, no ataxia.   IMPRESSION: This is a pleasant 60 yo RH woman with a history of hyperlipidemia, who presented initially with a 3-year history of intermittent dizziness suggestive of vertigo that had worsened with nausea, headaches, and a transient episode of left  hand numbness and tingling. MRI brain in 2018 had shown a faint punctate focus of reduced diffusion in the right frontal lobe deep white matter, difficult to see on the ADC map due to small size, this can be seen with acute ischemia or hyperacute demyelination. Repeat MRI did not show any progression or new changes. She is on Plavix for secondary stroke prevention, continue control of vascular risk factors. Left arm numbness due to cervical radiculopathy has improved with physical therapy. She denies any dizziness, significant headaches. Follow-up as needed, she knows to call for any changes.   Thank you for allowing me to participate in her care.  Please do not hesitate to call for any questions or concerns.    Ellouise Newer, M.D.   CC: Dr. Volanda Napoleon

## 2021-04-02 ENCOUNTER — Other Ambulatory Visit: Payer: Self-pay | Admitting: Internal Medicine

## 2021-04-02 DIAGNOSIS — I7300 Raynaud's syndrome without gangrene: Secondary | ICD-10-CM

## 2021-04-02 DIAGNOSIS — R768 Other specified abnormal immunological findings in serum: Secondary | ICD-10-CM

## 2021-04-11 ENCOUNTER — Encounter: Payer: Self-pay | Admitting: Pulmonary Disease

## 2021-04-11 ENCOUNTER — Other Ambulatory Visit: Payer: Self-pay

## 2021-04-11 ENCOUNTER — Ambulatory Visit (INDEPENDENT_AMBULATORY_CARE_PROVIDER_SITE_OTHER): Payer: 59 | Admitting: Pulmonary Disease

## 2021-04-11 VITALS — BP 122/74 | HR 74 | Temp 97.9°F | Ht 69.0 in | Wt 226.0 lb

## 2021-04-11 DIAGNOSIS — G4733 Obstructive sleep apnea (adult) (pediatric): Secondary | ICD-10-CM | POA: Diagnosis not present

## 2021-04-11 DIAGNOSIS — J310 Chronic rhinitis: Secondary | ICD-10-CM | POA: Diagnosis not present

## 2021-04-11 DIAGNOSIS — J329 Chronic sinusitis, unspecified: Secondary | ICD-10-CM

## 2021-04-11 NOTE — Patient Instructions (Signed)
Will arrange for referral to ENT to assess recurrent sinus infections  Will call you with results of your CPAP download  Follow up in 1 year

## 2021-04-11 NOTE — Progress Notes (Signed)
[facility]  Chief Complaint  Patient presents with   Follow-up    Pt states that her machine was working well until a few days ago when she got her sinus infection and feels like she cant breath with it on right now.     Constitutional:  BP 122/74 (BP Location: Left Arm, Patient Position: Sitting, Cuff Size: Normal)   Pulse 74   Temp 97.9 F (36.6 C) (Oral)   Ht 5\' 9"  (1.753 m)   Wt 226 lb (102.5 kg)   SpO2 98%   BMI 33.37 kg/m   Past Medical History:  GERD, Allergies, HLD, Lactose intolerance, Thrombotic thrombocytopenic purpura, Positive ANA  Past Surgical History:  She  has a past surgical history that includes Splenectomy (1987); Abdominal hysterectomy (2006); Knee arthroscopy (Right); and Nasal septum surgery.  Brief Summary:  Deanna [name] is a 60 y.o. female with obstructive sleep apnea.      Subjective:   She has been using CPAP nightly until she got another sinus infection.  She previously had sinus surgery.  She has persistent nasal drainage and gets sinus infections several times per year.  She has been using nasal irrigation, nasal steroid sprays, and antihistamine pills.  CPAP has helped her sleep.  She isn't waking up as much as before and feels more rested during the day.   Physical Exam:   Appearance - well kempt   ENMT - no sinus tenderness, no oral exudate, no LAN, Mallampati 4 airway, no stridor, scalloped tongue, high arched palate  Respiratory - equal breath sounds bilaterally, no wheezing or rales  CV - s1s2 regular rate and rhythm, no murmurs  Ext - no clubbing, no edema  Skin - no rashes  Psych - normal mood and affect    Sleep Tests:  PSG 11/26/20 >> AHI 13.7, SpO2 low 87%  Cardiac Tests:  Echo 07/28/16 >> EF 60 to 65%, grade 1 DD, mild LVH, mild AR, aortic root 38 mm  Social History:  She  reports that she has never smoked. She has never used smokeless tobacco. She reports that  she does not drink alcohol and does not use drugs.  Family History:  Her family history includes Arthritis in her mother; Breast cancer in her daughter; Hyperlipidemia in her mother; Hypertension in her father and mother; Lupus in her maternal aunt and son; Stroke in her mother.     Assessment/Plan:   Obstructive sleep apnea. - she is compliant with CPAP and reports benefit from therapy - she uses [employer] for her DME - continue auto CPAP 5 to 15 cm H2O - will call her with results of her download  Recurrent sinusitis with chronic rhinitis. 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 - followed by Dr. Vernelle Emerald with [facility]  Obesity. - hopefully she can transition off prednisone soon and then work on weight reduction  Time Spent Involved in Patient Care on Day of Examination:  25 minutes  Follow up:   Patient Instructions  Will arrange for referral to ENT to assess recurrent sinus infections  Will call you with results of your CPAP download  Follow up in 1 year  Medication List:   Allergies as of 04/11/2021       Reactions   Eggs Or Egg-derived Products Nausea And Vomiting   Lactose Intolerance (gi) Other (See Comments)   vomiting  Medication List        Accurate as of April 11, 2021  5:08 PM. If you have any questions, ask your nurse or doctor.          ALEVE PO Take by mouth as needed.   Cholecalciferol 100 MCG (4000 UT) Caps Take by mouth daily.   clopidogrel 75 MG tablet Commonly known as: PLAVIX Take 1 tablet (75 mg total) by mouth daily.   fexofenadine 180 MG tablet Commonly known as: ALLEGRA TAKE 1 TABLET BY MOUTH EVERY DAY   hydroxychloroquine 200 MG tablet Commonly known as: PLAQUENIL TAKE 2 TABLETS BY MOUTH EVERY DAY   IRON PO Take by mouth.        Signature:  Chesley Mires, MD [place] Pager - [phone] 04/11/2021, 5:08 PM

## 2021-04-15 ENCOUNTER — Telehealth: Payer: Self-pay | Admitting: Pulmonary Disease

## 2021-04-15 NOTE — Telephone Encounter (Signed)
Spoke with patient regarding cpap compliance report. They verbalized understanding. No further questions.  Nothing further needed at this time.

## 2021-04-15 NOTE — Telephone Encounter (Signed)
Auto CPAP 02/23/21 to 03/24/21 >> used on 30 of 30 nights with average 4 hrs 14 min.  Average AHI 0.6 with median CPAP 6 and 95 th percentile CPAP 8 cm H2O.  Please let her know her CPAP report shows good control of sleep apnea with current set up.

## 2021-04-17 NOTE — Progress Notes (Signed)
Office Visit Note  Patient: Deanna [name]             Date of Birth: [birth year]           MRN: [id number]             PCP: Billie Ruddy, MD Referring: Billie Ruddy, MD Visit Date: 04/18/2021   Subjective:   History of Present Illness: Deanna [name] is a 60 y.o. 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 She has had ongoing upper respiratory problems since our last visit with recurrent ear infection and upper respiratory infections most of the past 3 months. She has taken several rounds of antibiotics with clearance but quick return of symptoms. She was treated with high dose prednisone once after visiting to the emergency department. She has experienced bleeding and ulceration in the nose. She has also suffered spontaneous bruising in one digit for about 2 days at a time with spontaneous resolution. She had past ENT surgery for septum defect but has not had chronic problems until all this started again after our last visit. She has noticed a small increased in lower extremity swelling with deeper sock band indentations, no discoloration or pain or significant weight change. She had her HCQ eye exam with [facility] okay to continue HCQ but they are planning to follow up in 2-4 weeks.  Previous HPI 01/11/21 Deanna [name] is a 60 y.o. female here for follow up for UCTD after starting HCQ 400 mg PO daily and prednisone 5 mg PO daily.  Overall doing well on this but has continued the low-dose steroids.  She has not noticed problems with taking the medication.   09/07/20 Deanna [name] is a 60 y.o. female here for evaluation of positive ANA with fatigue and myalgias. Symptoms are persistent since last year with fatigue, pains in her hands and ankles and stiffness but she has had some joint pains going back for years. She typically notices swelling at the ankles by the end of the day. This is moderately painful  but not debilitating. Her hand pain is worst with use during the day and she notices swelling when eating a lot of salt. 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 She does experience raynaud's symptoms with pallor in fingertips without any lesions ulcers or injuries. She denies problems with mouth and eye dryness, lymphadenopathy, chest pain, skin rashes, or photosensitive   [facility] Labs reviewed ANA 1:640 speckled CB-CAP EC4d 16 CB-CAP BC4d 90   Lupus manifestations Fatigue Arthralgia Raynaud's ?Spontaneous bruising Hx of TTP s/p splenectomy (primary vs secondary?)  Labs reviewed ANA pos Scl-70 1.3   Review of Systems  Constitutional:  Positive for fatigue.  HENT:  Negative for mouth sores, mouth dryness and nose dryness.        Nose sores  Eyes:  Negative for pain, itching and dryness.  Respiratory:  Negative for shortness of breath and difficulty breathing.   Cardiovascular:  Negative for chest pain and palpitations.  Gastrointestinal:  Negative for blood in stool, constipation and diarrhea.  Endocrine: Negative for increased urination.  Genitourinary:  Negative for difficulty urinating.  Musculoskeletal:  Positive for joint pain, joint pain, joint swelling and morning stiffness. Negative for myalgias, muscle tenderness and myalgias.  Skin:  Negative for color change, rash and redness.  Allergic/Immunologic: Positive for susceptible to infections.  Neurological:  Positive for headaches and weakness. Negative for dizziness, numbness and memory loss.  Hematological:  Positive for bruising/bleeding tendency.  Psychiatric/Behavioral:  Negative for confusion.    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   Aortic root dilatation ([place]) 08/07/2016   Ear pain 07/22/2016   Hyperlipemia 12/02/2015   H/O splenectomy 12/02/2015   Hyperglycemia 01/27/2014   Obesity 01/27/2014   Vitamin D deficiency 09/11/2012    Past Medical History:  Diagnosis Date   Allergy    GERD (gastroesophageal reflux disease)    "does not need meds anymore"   Heart murmur    Hyperlipidemia    Lactose intolerance    OSA (obstructive sleep apnea)    Positive ANA (antinuclear antibody)    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[place])     Family History  Problem Relation Age of Onset   Arthritis Mother    Hyperlipidemia Mother    Stroke Mother    Hypertension Mother    Hypertension Father    Lupus Son    Breast cancer Daughter    Lupus Maternal Aunt    Colon cancer Neg Hx    Past Surgical History:  Procedure Laterality Date   ABDOMINAL HYSTERECTOMY  2006   benign tumor, complete   KNEE ARTHROSCOPY Right    NASAL SEPTUM SURGERY     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   Social History   Social History Narrative   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: lives 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, husband and mother      Spiritual Beliefs: Christian      Lifestyle: walks a little a few days per week, diet is ok      Right Handed      Drinks Caffeine         Immunization History  Administered Date(s) Administered   Meningococcal B, OMV 12/02/2015   PFIZER Comirnaty(Gray Top)Covid-19 Tri-Sucrose Vaccine 07/19/2019, 08/09/2019, 06/16/2020   PPD Test 01/21/2018   Pneumococcal Polysaccharide-23 10/14/2010, 10/21/2014     Objective: Vital Signs: BP 132/83 (BP  Location: Left Arm, Patient Position: Sitting, Cuff Size: Large)   Pulse 71   Ht 5\' 9"  (1.753 m)   Wt 225 lb 12.8 oz (102.4 kg)   BMI 33.34 kg/m    Physical Exam HENT:     Nose:     Comments: Small ulcer at left nares septal wall    Mouth/Throat:     Mouth: Mucous membranes are moist.     Pharynx: Oropharynx is clear.  Eyes:     Conjunctiva/sclera: Conjunctivae normal.  Cardiovascular:     Rate and Rhythm: Normal rate and regular rhythm.     Heart sounds: Murmur (Diastolic murmur loudest at LUSB) heard.  Musculoskeletal:     Comments: 1+ pedal edema b/l  Skin:    General: Skin is warm and dry.     Findings: No rash.  Neurological:  Mental Status: She is alert.  Psychiatric:        Mood and Affect: Mood normal.     Musculoskeletal Exam:  Shoulders full ROM no tenderness or swelling Elbows full ROM no tenderness or swelling Wrists full ROM no tenderness or swelling Fingers full ROM no tenderness or swelling Knees full ROM no tenderness or swelling, patellofemoral crepitus present   Investigation: No additional findings.  Imaging: No results found.  Recent Labs: Lab Results  Component Value Date   WBC 9.2 10/10/2020   HGB 12.8 10/10/2020   PLT 477 (H) 10/10/2020   NA 139 10/10/2020   K 3.8 10/10/2020   CL 106 10/10/2020   CO2 27 10/10/2020   GLUCOSE 87 10/10/2020   BUN 13 10/10/2020   CREATININE 0.91 10/10/2020   BILITOT 0.7 10/10/2020   ALKPHOS 71 10/10/2020   AST 14 (L) 10/10/2020   ALT 16 10/10/2020   PROT 8.1 10/10/2020   ALBUMIN 3.9 10/10/2020   CALCIUM 9.1 10/10/2020   GFRAA >60 11/27/2019   QFTBGOLDPLUS NEGATIVE 01/11/2021    Speciality Comments: No specialty comments available.  Procedures:  No procedures performed Allergies: Eggs or egg-derived products and Lactose intolerance (gi)   Assessment / Plan:     Visit Diagnoses: Undifferentiated connective tissue disease ([place]) - Plan: C3 and C4, Sedimentation rate, CBC with  Differential/Platelet, COMPLETE METABOLIC PANEL WITH GFR, Urinalysis, Protein / creatinine ratio, urine  Ongoing upper respiratory and sinus infection symptoms almost continuously since her last visit but no obvious systemic inflammation such as joint or skin changes.  We will recheck sedimentation rate serum complements urinalysis and protein creatinine ratio and CBC and CMP.  Raynaud's phenomenon without gangrene  No new exacerbation of Raynaud's symptoms no digital pitting blistering peeling or other changes.  Spontaneous bruising  Spontaneous bruising in isolated digits nothing today but patient photographs reviewed.  Fairly unusual could be consistent with Achenbach syndrome though also has history of TTP.  High risk medication use  Hydroxychloroquine retinal toxicity screening with ophthalmology with normal report reviewed.  Monitoring labs as above.  Pedal edema - Plan: COMPLETE METABOLIC PANEL WITH GFR, Urinalysis, Protein / creatinine ratio, urine  Mild increase in bilateral pedal edema could be benign with known venous varicosities also checking CMP and urine protein.  If findings are benign might benefit with updated transthoracic echocardiogram to reassess aortic root dilation and regurgitation last study from 2018.   Orders: Orders Placed This Encounter  Procedures   C3 and C4   Sedimentation rate   CBC with Differential/Platelet   COMPLETE METABOLIC PANEL WITH GFR   Urinalysis   Protein / creatinine ratio, urine    No orders of the defined types were placed in this encounter.    Follow-Up Instructions: No follow-ups on file.   Collier Salina, MD  Note - This record has been created using [employer].  Chart creation errors have been sought, but may not always  have been located. Such creation errors do not reflect on  the standard of medical care.

## 2021-04-18 ENCOUNTER — Ambulatory Visit (INDEPENDENT_AMBULATORY_CARE_PROVIDER_SITE_OTHER): Payer: 59 | Admitting: Internal Medicine

## 2021-04-18 ENCOUNTER — Encounter: Payer: Self-pay | Admitting: Internal Medicine

## 2021-04-18 ENCOUNTER — Other Ambulatory Visit: Payer: Self-pay

## 2021-04-18 VITALS — BP 132/83 | HR 71 | Ht 69.0 in | Wt 225.8 lb

## 2021-04-18 DIAGNOSIS — I7300 Raynaud's syndrome without gangrene: Secondary | ICD-10-CM

## 2021-04-18 DIAGNOSIS — R768 Other specified abnormal immunological findings in serum: Secondary | ICD-10-CM

## 2021-04-18 DIAGNOSIS — M359 Systemic involvement of connective tissue, unspecified: Secondary | ICD-10-CM | POA: Diagnosis not present

## 2021-04-18 DIAGNOSIS — Z79899 Other long term (current) drug therapy: Secondary | ICD-10-CM

## 2021-04-18 DIAGNOSIS — R233 Spontaneous ecchymoses: Secondary | ICD-10-CM

## 2021-04-18 DIAGNOSIS — R600 Localized edema: Secondary | ICD-10-CM

## 2021-04-18 DIAGNOSIS — I77810 Thoracic aortic ectasia: Secondary | ICD-10-CM

## 2021-04-19 LAB — COMPLETE METABOLIC PANEL WITH GFR
AG Ratio: 1.2 (calc) (ref 1.0–2.5)
ALT: 10 U/L (ref 6–29)
AST: 12 U/L (ref 10–35)
Albumin: 4.2 g/dL (ref 3.6–5.1)
Alkaline phosphatase (APISO): 68 U/L (ref 37–153)
BUN/Creatinine Ratio: 15 (calc) (ref 6–22)
BUN: 17 mg/dL (ref 7–25)
CO2: 29 mmol/L (ref 20–32)
Calcium: 9.8 mg/dL (ref 8.6–10.4)
Chloride: 104 mmol/L (ref 98–110)
Creat: 1.15 mg/dL — ABNORMAL HIGH (ref 0.50–1.03)
Globulin: 3.4 g/dL (calc) (ref 1.9–3.7)
Glucose, Bld: 84 mg/dL (ref 65–99)
Potassium: 4.3 mmol/L (ref 3.5–5.3)
Sodium: 141 mmol/L (ref 135–146)
Total Bilirubin: 0.7 mg/dL (ref 0.2–1.2)
Total Protein: 7.6 g/dL (ref 6.1–8.1)
eGFR: 55 mL/min/{1.73_m2} — ABNORMAL LOW (ref >=60)

## 2021-04-19 LAB — CBC WITH DIFFERENTIAL/PLATELET
Absolute Monocytes: 840 cells/uL (ref 200–950)
Basophils Absolute: 59 cells/uL (ref 0–200)
Basophils Relative: 0.7 %
Eosinophils Absolute: 168 cells/uL (ref 15–500)
Eosinophils Relative: 2 %
HCT: 35.7 % (ref 35.0–45.0)
Hemoglobin: 11.9 g/dL (ref 11.7–15.5)
Lymphs Abs: 2612 cells/uL (ref 850–3900)
MCH: 29 pg (ref 27.0–33.0)
MCHC: 33.3 g/dL (ref 32.0–36.0)
MCV: 86.9 fL (ref 80.0–100.0)
MPV: 10 fL (ref 7.5–12.5)
Monocytes Relative: 10 %
Neutro Abs: 4721 cells/uL (ref 1500–7800)
Neutrophils Relative %: 56.2 %
Platelets: 500 10*3/uL — ABNORMAL HIGH (ref 140–400)
RBC: 4.11 10*6/uL (ref 3.80–5.10)
RDW: 13.2 % (ref 11.0–15.0)
Total Lymphocyte: 31.1 %
WBC: 8.4 10*3/uL (ref 3.8–10.8)

## 2021-04-19 LAB — C3 AND C4
C3 Complement: 139 mg/dL (ref 83–193)
C4 Complement: 30 mg/dL (ref 15–57)

## 2021-04-19 LAB — URINALYSIS
Bilirubin Urine: NEGATIVE
Glucose, UA: NEGATIVE
Ketones, ur: NEGATIVE
Leukocytes,Ua: NEGATIVE
Nitrite: NEGATIVE
Protein, ur: NEGATIVE
Specific Gravity, Urine: 1.024 (ref 1.001–1.035)
pH: 5.5 (ref 5.0–8.0)

## 2021-04-19 LAB — SEDIMENTATION RATE: Sed Rate: 33 mm/h — ABNORMAL HIGH (ref 0–30)

## 2021-04-19 LAB — PROTEIN / CREATININE RATIO, URINE
Creatinine, Urine: 165 mg/dL (ref 20–275)
Protein/Creat Ratio: 61 mg/g creat (ref 24–184)
Protein/Creatinine Ratio: 0.061 mg/mg creat (ref 0.024–0.184)
Total Protein, Urine: 10 mg/dL (ref 5–24)

## 2021-04-25 MED ORDER — HYDROXYCHLOROQUINE SULFATE 200 MG PO TABS: 400.0000 mg | ORAL_TABLET | Freq: Every day | ORAL | 1 refills | Status: DC

## 2021-04-25 NOTE — Addendum Note (Signed)
Addended by: Collier Salina on: 04/25/2021 10:00 AM   Modules accepted: Orders

## 2021-04-25 NOTE — Progress Notes (Signed)
Sedimentation rate test is mildly elevated at 33 up from 21 checked earlier this year suggesting some inflammation not sure if is related to her ongoing sinus infections.  Urine protein is completely normal.  Her kidney function tests is very slightly worse but I do not believe it is a significant extent.  Platelet count is high at 500 this is consistent with an inflammatory response but definitely not TTP.  Recommend continuing the hydroxychloroquine 400 mg daily. We need to schedule follow-up appointment recommend around 6 months.

## 2021-06-09 DIAGNOSIS — J329 Chronic sinusitis, unspecified: Secondary | ICD-10-CM | POA: Insufficient documentation

## 2021-07-20 ENCOUNTER — Ambulatory Visit (INDEPENDENT_AMBULATORY_CARE_PROVIDER_SITE_OTHER): Payer: 59 | Admitting: Podiatry

## 2021-07-20 ENCOUNTER — Other Ambulatory Visit: Payer: Self-pay

## 2021-07-20 ENCOUNTER — Encounter: Payer: Self-pay | Admitting: Podiatry

## 2021-07-20 DIAGNOSIS — L600 Ingrowing nail: Secondary | ICD-10-CM

## 2021-07-20 MED ORDER — DOXYCYCLINE HYCLATE 100 MG PO TABS: 100.0000 mg | ORAL_TABLET | Freq: Two times a day (BID) | ORAL | 1 refills | Status: DC

## 2021-08-03 ENCOUNTER — Encounter: Payer: Self-pay | Admitting: Family Medicine

## 2021-08-03 ENCOUNTER — Other Ambulatory Visit: Payer: Self-pay

## 2021-08-03 ENCOUNTER — Telehealth: Payer: Self-pay | Admitting: Family Medicine

## 2021-08-03 ENCOUNTER — Telehealth (INDEPENDENT_AMBULATORY_CARE_PROVIDER_SITE_OTHER): Payer: 59 | Admitting: Family Medicine

## 2021-08-03 DIAGNOSIS — U071 COVID-19: Secondary | ICD-10-CM

## 2021-08-03 MED ORDER — BENZONATATE 100 MG PO CAPS: 100.0000 mg | ORAL_CAPSULE | Freq: Two times a day (BID) | ORAL | 0 refills | Status: DC | PRN

## 2021-08-03 MED ORDER — MOLNUPIRAVIR EUA 200MG CAPSULE: 4.0000 | ORAL_CAPSULE | Freq: Two times a day (BID) | ORAL | 0 refills | Status: AC

## 2021-08-03 NOTE — Progress Notes (Signed)
Virtual Visit via Video Note ? ?I connected with Deanna [name] on 08/03/21 at 11:30 AM EDT by a video enabled telemedicine application 2/2 [id number] pandemic and verified that I am speaking with the correct person using two identifiers. ? Location patient: home ?Location provider:work or home office ?Persons participating in the virtual visit: patient, provider ? ?I discussed the limitations of evaluation and management by telemedicine and the availability of in person appointments. The patient expressed understanding and agreed to proceed. ? ?Chief Complaint  ?Patient presents with  ? Covid Positive  ?  Tested + today and has a cough, feels like a sinus infection. Using the nasal spray. Coughing so much, her chest is hurting. Also took some robitussin.  Cough started Monday.   ? ? ?HPI: ?Pt is a 61 year old female with pmh sig for HLD, mild aortic root dilation, AV sclerosis, TTP, OSA, GERD with h/o H. pylori, allergies, anemia, history of splenectomy, positive ANA, Raynaud's phenomenon, undifferentiated connective tissue disease who was seen for acute concern.   Pt endorses epistaxis on Sunday night.  She then developed a HA that worsened.  Pt stayed in bed on Monday.  Felt a little better on Tuesday.  Still having increased productive cough, soreness in chest from coughing, L ear pain, rhinorrhea, L sided facial pressure, subjective fever, chills.  Appetite is decreased but drinking fluids.  Denies current HA, ST, n/v, diarrhea, sick contacts.  Tried nasal spray, Aleeve, and robitussin for symptoms.   ? ?Pt fully vaccinated and boosted for COVID-19 x1. ? ?ROS: See pertinent positives and negatives per HPI. ? ?Past Medical History:  ?Diagnosis Date  ? Allergy   ? GERD (gastroesophageal reflux disease)   ? "does not need meds anymore"  ? Heart murmur   ? Hyperlipidemia   ? Lactose intolerance   ? OSA (obstructive sleep apnea)   ? Positive ANA (antinuclear antibody)   ? TTP (thrombotic thrombocytopenic purpura)  (HCC)   ? ? ?Past Surgical History:  ?Procedure Laterality Date  ? ABDOMINAL HYSTERECTOMY  2006  ? benign tumor, complete  ? KNEE ARTHROSCOPY Right   ? NASAL SEPTUM SURGERY    ? SPLENECTOMY  1987  ? TTP during pregnancy  ? ? ?Family History  ?Problem Relation Age of Onset  ? Arthritis Mother   ? Hyperlipidemia Mother   ? Stroke Mother   ? Hypertension Mother   ? Hypertension Father   ? Lupus Son   ? Breast cancer Daughter   ? Lupus Maternal Aunt   ? Colon cancer Neg Hx   ? ? ?Current Outpatient Medications:  ?  Cholecalciferol 100 MCG (4000 UT) CAPS, Take by mouth daily., Disp: , Rfl:  ?  clopidogrel (PLAVIX) 75 MG tablet, Take 1 tablet (75 mg total) by mouth daily., Disp: 90 tablet, Rfl: 3 ?  doxycycline (VIBRA-TABS) 100 MG tablet, Take 1 tablet (100 mg total) by mouth 2 (two) times daily., Disp: 16 tablet, Rfl: 1 ?  Ferrous Sulfate (IRON PO), Take by mouth., Disp: , Rfl:  ?  fexofenadine (ALLEGRA) 180 MG tablet, TAKE 1 TABLET BY MOUTH EVERY DAY, Disp: 30 tablet, Rfl: 2 ?  hydroxychloroquine (PLAQUENIL) 200 MG tablet, Take 2 tablets (400 mg total) by mouth daily., Disp: 180 tablet, Rfl: 1 ?  Naproxen Sodium (ALEVE PO), Take by mouth as needed., Disp: , Rfl:  ? ?EXAM: ? ?VITALS per patient if applicable:  RR between 12-20 bpm ? ?GENERAL: alert, oriented, appears well and in no acute distress ? ?HEENT: atraumatic,  conjunctiva clear, no obvious abnormalities on inspection of external nose and ears ? ?NECK: normal movements of the head and neck ? ?LUNGS: Intermittent cough, on inspection no signs of respiratory distress, breathing rate appears normal, no obvious gross SOB, gasping or wheezing ? ?CV: no obvious cyanosis ? ?MS: moves all visible extremities without noticeable abnormality ? ?PSYCH/NEURO: pleasant and cooperative, no obvious depression or anxiety, speech and thought processing grossly intact ? ?ASSESSMENT AND PLAN: ? ?Discussed the following assessment and plan: ? ?COVID-19 virus infection  ?-symptoms  started 2-3 days ago ?-positive covid test today 08/03/21 with what appears to be fairly mild symptoms. ?-discussed quarantine for at least 5 days, then wearing mask for at least the next 5 days if around others. ?-continue OTC cough/cold medications, flonase nasal spray/saline nasal rinse, cough drops, steam from shower, rest, hydration, etc. ?-Tylenol as needed for pain/discomfort.  Can also use heat for soreness of intercostal muscles from coughing. ?-Discussed r/b/a of antiviral medications given h/o elevated ANA/undifferentiated connective tissue disease on high risk medication Plaquenil.  Pt wishes to start molnupiravir. ?-given strict precautions ?- Plan: molnupiravir EUA (LAGEVRIO) 200 mg CAPS capsule, benzonatate (TESSALON) 100 MG capsule ? ?F/u prn ?  ?I discussed the assessment and treatment plan with the patient. The patient was provided an opportunity to ask questions and all were answered. The patient agreed with the plan and demonstrated an understanding of the instructions. ?  ?The patient was advised to call back or seek an in-person evaluation if the symptoms worsen or if the condition fails to improve as anticipated. ? ? ?Billie Ruddy, MD  ? ?

## 2021-08-03 NOTE — Telephone Encounter (Signed)
Patient calling in with respiratory symptoms: ?Shortness of breath, chest pain, palpitations or other red words send to Triage ? ?Does the patient have a fever over 100, cough, congestion, sore throat, runny nose, lost of taste/smell (please list symptoms that patient has)?cough ?What date did symptoms start? About 3 days ago ?(If over 5 days ago, pt may be scheduled for in person visit) ? ?Have you tested for Covid in the last 5 days? Yes  ? ?If yes, was it positive '[x]'$  OR negative '[]'$ ? If positive in the last 5 days, please schedule virtual visit now. If negative, schedule for an in person OV with the next available provider if PCP has no openings. Please also let patient know they will be tested again (follow the script below) ? ?"you will have to arrive 27mns prior to your appt time to be Covid tested. Please park in back of office at the [facility] & call [phone] let the staff know you have arrived. A staff member will meet you at your car to do a rapid covid test. Once the test has resulted you will be notified by phone of your results to determine if appt will remain an in person visit or be converted to a virtual/phone visit. If you arrive less than 335ms before your appt time, your visit will be automatically converted to virtual & any recommended testing will happen AFTER the visit." ? ?Pt has virtual with dr banks today at 1130 am ?THINGS TO REMEMBER ? ?If no availability for virtual visit in office,  please schedule another [place] office ? ?If no availability at another [facility], please instruct patient that they can schedule an evisit or virtual visit through their mychart account. Visits up to 8pm ? ?patients can be seen in office 5 days after positive COVID test ? ?  ?

## 2021-08-12 ENCOUNTER — Ambulatory Visit (INDEPENDENT_AMBULATORY_CARE_PROVIDER_SITE_OTHER): Payer: 59 | Admitting: Podiatry

## 2021-08-12 ENCOUNTER — Encounter: Payer: Self-pay | Admitting: Podiatry

## 2021-08-12 DIAGNOSIS — L600 Ingrowing nail: Secondary | ICD-10-CM

## 2021-08-12 NOTE — Progress Notes (Signed)
Subjective:  ? ?Patient ID: Deanna [name], female   DOB: 61 y.o.   MRN: [id number]  ? ?HPI ?Patient presents stating she has a damaged third nail left that is been giving her trouble and she wants it corrected long-term has had it worked on several times in the most part of the bed has been eradicated but there is 1 area that still gets inflamed neuro ? ? ?ROS ? ? ?   ?Objective:  ?Physical Exam  ?Vascular status intact with 1 area on the plantar aspect left that continues to create nail that gets irritated as it grows out ? ?   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 ?   ?Plan:  ?H&P reviewed and I am very hopeful this will be the last time that this will recur and I explained that it is always possible he can and she is willing to accept that risk.  Today I went ahead and I allowed her to read and signed consent form I infiltrated the left hallux 60 mg like Marcaine mixture sterile prep done and using sterile instrumentation removed the hallux nail exposed matrix applied phenol for applications 30 seconds followed by alcohol by sterile dressing gave instructions on soaks and leave dressing on 24 hours but take it off earlier if any throbbing were to occur.  Encouraged to call with questions concerns ?   ? ? ?

## 2021-08-12 NOTE — Progress Notes (Signed)
Subjective:  ? ?Patient ID: Deanna [name], female   DOB: 61 y.o.   MRN: [id number]  ? ?HPI ?Patient states she has a spicule of nail that is been bothering her and she wants it taken care of but she is getting ready to go to [place] and needs to wait till afterwards ? ? ?ROS ? ? ?   ?Objective:  ?Physical Exam  ?Neurovascular status intact with patient found to have a spicule of nail on the left hallux medial side of the rest the nail being eradicated from previous procedures that has 1 small area left ? ?   ?Assessment:  ?Chronic reoccurrence of nail but it gets smaller and smaller and is down to 1 small spot ? ?   ?Plan:  ?Sterile prep discussed permanent procedure educated her on this and she will reappoint to have this done in the next few weeks ?   ? ? ?

## 2021-08-12 NOTE — Patient Instructions (Signed)

## 2021-09-15 ENCOUNTER — Ambulatory Visit (INDEPENDENT_AMBULATORY_CARE_PROVIDER_SITE_OTHER): Payer: 59 | Admitting: Family Medicine

## 2021-09-15 ENCOUNTER — Encounter: Payer: Self-pay | Admitting: Family Medicine

## 2021-09-15 VITALS — BP 149/84 | HR 77 | Temp 99.0°F | Wt 225.8 lb

## 2021-09-15 DIAGNOSIS — J020 Streptococcal pharyngitis: Secondary | ICD-10-CM

## 2021-09-15 DIAGNOSIS — R509 Fever, unspecified: Secondary | ICD-10-CM | POA: Diagnosis not present

## 2021-09-15 DIAGNOSIS — J029 Acute pharyngitis, unspecified: Secondary | ICD-10-CM

## 2021-09-15 LAB — POC COVID19 BINAXNOW: SARS Coronavirus 2 Ag: NEGATIVE

## 2021-09-15 LAB — POCT RAPID STREP A (OFFICE): Rapid Strep A Screen: POSITIVE — AB

## 2021-09-15 LAB — POCT INFLUENZA A/B: Influenza A, POC: NEGATIVE

## 2021-09-15 MED ORDER — AMOXICILLIN 500 MG PO TABS: 500.0000 mg | ORAL_TABLET | Freq: Two times a day (BID) | ORAL | 0 refills | Status: AC

## 2021-09-15 NOTE — Progress Notes (Signed)
Subjective:  ? ? Patient ID: Deanna [name], female    DOB: [birth year], 61 y.o.   MRN: [id number] ? ?Chief Complaint  ?Patient presents with  ? Sore Throat  ?  Tested positive, has low grade fever, with cough. 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 Just took some aleve.    ? ? ?HPI ?Patient was seen today for acute concern.  Patient endorses feeling sick over the wknd with fatigue, L ear pain/pressure, and cough.  Symptoms improved, but pt developed cough, insomnia, rhinorrhea, subjective fever, and chills last night.  This am pt had sore throat.  Took Aleeve.  Denies HA, n/v, diarrhea.  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 ? ?Past Medical History:  ?Diagnosis Date  ? Allergy   ? GERD (gastroesophageal reflux disease)   ? "does not need meds anymore"  ? Heart murmur   ? Hyperlipidemia   ? Lactose intolerance   ? OSA (obstructive sleep apnea)   ? Positive ANA (antinuclear antibody)   ? 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HCC)   ? ? ?Allergies  ?Allergen Reactions  ? Eggs Or Egg-Derived Products Nausea And Vomiting  ? Lactose Intolerance (Gi) Other (See Comments)  ?  vomiting  ? ? ?ROS ?General: Denies night sweats, changes in weight, changes in appetite + subjective fever, chills, insomnia ?HEENT: Denies headaches, changes in vision  + ear pain, sore throat, rhinorrhea ?CV: Denies CP, palpitations, SOB, orthopnea ?Pulm: Denies SOB, wheezing  + cough ?GI: Denies abdominal pain, nausea, vomiting, diarrhea, constipation ?GU: Denies dysuria, hematuria, frequency, vaginal discharge ?Msk: Denies muscle cramps, joint pains ?Neuro: Denies weakness, numbness, tingling ?Skin: Denies rashes, bruising ?Psych: Denies depression, anxiety, hallucinations ? ?   ?Objective:  ?  ?Blood pressure (!) 149/84, pulse 77, temperature 99 ?F (37.2 ?C), temperature source Oral, weight 225 lb 12.8 oz (102.4 kg), SpO2 99 %. ? ?Gen. Pleasant,  well-nourished, appears tired, feels warm to touch, in no distress, normal affect   ?HEENT: Angie/AT, face symmetric, conjunctiva clear, no scleral icterus, PERRLA, EOMI, nares patent with clear drainage, pharynx with erythema and exudate.  TMs full bilaterally.  No cervical lymphadenopathy. ?Lungs: no accessory muscle use, CTAB, no wheezes or rales ?Cardiovascular: RRR, no m/r/g, no peripheral edema ?Musculoskeletal: No deformities, no cyanosis or clubbing, normal tone ?Neuro:  A&Ox3, CN II-XII intact, normal gait ? ? ?Wt Readings from Last 3 Encounters:  ?09/15/21 225 lb 12.8 oz (102.4 kg)  ?04/18/21 225 lb 12.8 oz (102.4 kg)  ?04/11/21 226 lb (102.5 kg)  ? ? ?Lab Results  ?Component Value Date  ? WBC 8.4 04/18/2021  ? HGB 11.9 04/18/2021  ? HCT 35.7 04/18/2021  ? PLT 500 (H) 04/18/2021  ? GLUCOSE 84 04/18/2021  ? CHOL 198 06/09/2020  ? TRIG 65.0 06/09/2020  ? HDL 46.80 06/09/2020  ? LDLDIRECT 150.4 10/04/2012  ? [place] 138 (H) 06/09/2020  ? ALT 10 04/18/2021  ? AST 12 04/18/2021  ? NA 141 04/18/2021  ? K 4.3 04/18/2021  ? CL 104 04/18/2021  ? CREATININE 1.15 (H) 04/18/2021  ? BUN 17 04/18/2021  ? CO2 29 04/18/2021  ? TSH 1.10 06/09/2020  ? INR 1.0 10/10/2020  ? HGBA1C 6.2 06/09/2020  ? ? ?Assessment/Plan: ? ?Strep pharyngitis  ?-POC strep testing positive ?-Start ABX ?-Expectant management of symptoms including gargling with warm salt water Chloraseptic spray, Tylenol or NSAIDs for pain/discomfort, rest, hydration, etc. ?-Given  handout.  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 ?Sore throat ?-POC strep testing positive ?-POC COVID and influenza testing negative ?-Plan: POC Rapid Strep A, POC COVID-19, POC Influenza A/B ? ?Fever, unspecified fever cause ?-Supportive care including rest, hydration, Tylenol or NSAIDs ? ?F/u as needed ? ?Grier Mitts, MD ?

## 2021-10-12 NOTE — Progress Notes (Deleted)
Office Visit Note  Patient: Deanna [name]             Date of Birth: March 01, 1961           MRN: [id number]             PCP: Billie Ruddy, MD Referring: Billie Ruddy, MD Visit Date: 10/24/2021   Subjective:  No chief complaint on file.   History of Present Illness: Deanna [name] is a 61 y.o. 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   Previous HPI 04/18/2021  Deanna [name] is a 61 y.o. 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 She has had ongoing upper respiratory problems since our last visit with recurrent ear infection and upper respiratory infections most of the past 3 months. She has taken several rounds of antibiotics with clearance but quick return of symptoms. She was treated with high dose prednisone once after visiting to the emergency department. She has experienced bleeding and ulceration in the nose. She has also suffered spontaneous bruising in one digit for about 2 days at a time with spontaneous resolution. She had past ENT surgery for septum defect but has not had chronic problems until all this started again after our last visit. She has noticed a small increased in lower extremity swelling with deeper sock band indentations, no discoloration or pain or significant weight change. She had her HCQ eye exam with [facility] okay to continue HCQ but they are planning to follow up in 2-4 weeks.   Previous HPI 01/11/21 Deanna [name] is a 61 y.o. female here for follow up for UCTD after starting HCQ 400 mg PO daily and prednisone 5 mg PO daily.  Overall doing well on this but has continued the low-dose steroids.  She has not noticed problems with taking the medication.   09/07/20 Deanna [name] is a 61 y.o. female here for evaluation of positive ANA with fatigue and myalgias. Symptoms are persistent since last year with fatigue, pains in  her hands and ankles and stiffness but she has had some joint pains going back for years. She typically notices swelling at the ankles by the end of the day. This is moderately painful but not debilitating. Her hand pain is worst with use during the day and she notices swelling when eating a lot of salt. 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 She does experience raynaud's symptoms with pallor in fingertips without any lesions ulcers or injuries. She denies problems with mouth and eye dryness, lymphadenopathy, chest pain, skin rashes, or photosensitive   [facility] Labs reviewed ANA 1:640 speckled CB-CAP EC4d 16 CB-CAP BC4d 90   Lupus manifestations Fatigue Arthralgia Raynaud's ?Spontaneous bruising Hx of TTP s/p splenectomy (primary vs secondary?)   Labs reviewed ANA pos Scl-70 1.3     No Rheumatology ROS completed.   PMFS History:  Patient Active Problem List   Diagnosis Date Noted   Pedal edema 04/18/2021   High risk medication use 01/11/2021   Spontaneous bruising 10/19/2020   Undifferentiated connective tissue disease ([place]) 09/07/2020   Raynaud's phenomenon 09/07/2020   Onychomycosis 07/25/2019   Varicose veins of both legs with edema 07/25/2019   H. pylori infection 12/21/2016   Anemia 12/12/2016   Abnormal brain MRI 09/25/2016   Numbness and tingling in left  hand 09/25/2016   Cervical radiculopathy at C5 09/25/2016   Neck pain 09/25/2016   Aortic valve sclerosis 08/07/2016   Aortic root dilatation ([place]) 08/07/2016   Ear pain 07/22/2016   Hyperlipemia 12/02/2015   H/O splenectomy 12/02/2015   Hyperglycemia 01/27/2014   Obesity 01/27/2014   Vitamin D deficiency 09/11/2012    Past Medical History:  Diagnosis Date   Allergy    GERD (gastroesophageal reflux disease)    "does not need meds anymore"   Heart murmur    Hyperlipidemia    Lactose intolerance    OSA (obstructive sleep apnea)    Positive ANA  (antinuclear antibody)    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[place])     Family History  Problem Relation Age of Onset   Arthritis Mother    Hyperlipidemia Mother    Stroke Mother    Hypertension Mother    Hypertension Father    Lupus Son    Breast cancer Daughter    Lupus Maternal Aunt    Colon cancer Neg Hx    Past Surgical History:  Procedure Laterality Date   ABDOMINAL HYSTERECTOMY  2006   benign tumor, complete   KNEE ARTHROSCOPY Right    NASAL SEPTUM SURGERY     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   Social History   Social History Narrative   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   Physical Exam   Musculoskeletal Exam: ***  CDAI Exam: CDAI Score: -- Patient Global: --; Provider Global: -- Swollen: --; Tender: -- Joint Exam 10/24/2021   No joint exam has been documented for this visit   There is currently no information documented on the homunculus. Go to the Rheumatology activity and complete the homunculus joint exam.  Investigation: No additional findings.  Imaging: No results found.  Recent Labs: Lab Results  Component Value Date   WBC 8.4 04/18/2021   HGB 11.9 04/18/2021   PLT 500 (H) 04/18/2021   NA 141 04/18/2021   K 4.3 04/18/2021   CL 104 04/18/2021   CO2 29 04/18/2021   GLUCOSE 84 04/18/2021   BUN 17 04/18/2021   CREATININE 1.15 (H) 04/18/2021   BILITOT 0.7 04/18/2021    ALKPHOS 71 10/10/2020   AST 12 04/18/2021   ALT 10 04/18/2021   PROT 7.6 04/18/2021   ALBUMIN 3.9 10/10/2020   CALCIUM 9.8 04/18/2021   GFRAA >60 11/27/2019   QFTBGOLDPLUS NEGATIVE 01/11/2021    Speciality Comments: No specialty comments available.  Procedures:  No procedures performed Allergies: Eggs or egg-derived products and Lactose intolerance (gi)   Assessment / Plan:     Visit Diagnoses: No diagnosis found.  ***  Orders: No orders of the defined types were placed in this encounter.  No orders of the defined types were placed in this encounter.    Follow-Up Instructions: No follow-ups  on file.   Earnestine Mealing, CMA  Note - This record has been created using Editor, commissioning.  Chart creation errors have been sought, but may not always  have been located. Such creation errors do not reflect on  the standard of medical care.

## 2021-10-24 ENCOUNTER — Ambulatory Visit: Payer: 59 | Admitting: Internal Medicine

## 2021-10-24 DIAGNOSIS — M359 Systemic involvement of connective tissue, unspecified: Secondary | ICD-10-CM

## 2021-10-24 DIAGNOSIS — I7300 Raynaud's syndrome without gangrene: Secondary | ICD-10-CM

## 2021-10-24 DIAGNOSIS — Z79899 Other long term (current) drug therapy: Secondary | ICD-10-CM

## 2021-10-24 DIAGNOSIS — R233 Spontaneous ecchymoses: Secondary | ICD-10-CM

## 2021-10-24 DIAGNOSIS — R600 Localized edema: Secondary | ICD-10-CM

## 2021-10-27 ENCOUNTER — Encounter: Payer: Self-pay | Admitting: Internal Medicine

## 2021-10-27 ENCOUNTER — Ambulatory Visit (INDEPENDENT_AMBULATORY_CARE_PROVIDER_SITE_OTHER): Payer: 59 | Admitting: Internal Medicine

## 2021-10-27 VITALS — BP 139/88 | HR 69 | Resp 16 | Ht 69.0 in | Wt 230.2 lb

## 2021-10-27 DIAGNOSIS — R768 Other specified abnormal immunological findings in serum: Secondary | ICD-10-CM

## 2021-10-27 DIAGNOSIS — Z79899 Other long term (current) drug therapy: Secondary | ICD-10-CM | POA: Diagnosis not present

## 2021-10-27 DIAGNOSIS — I7300 Raynaud's syndrome without gangrene: Secondary | ICD-10-CM

## 2021-10-27 DIAGNOSIS — M359 Systemic involvement of connective tissue, unspecified: Secondary | ICD-10-CM

## 2021-10-27 MED ORDER — HYDROXYCHLOROQUINE SULFATE 200 MG PO TABS: 400.0000 mg | ORAL_TABLET | Freq: Every day | ORAL | 1 refills | Status: DC

## 2021-10-27 NOTE — Progress Notes (Unsigned)
Office Visit Note  Patient: Deanna [name]             Date of Birth: Sep 29, 1960           MRN: [id number]             PCP: Billie Ruddy, MD Referring: Billie Ruddy, MD Visit Date: 10/27/2021   Subjective:  Pain of the Lower Back and Rash   History of Present Illness: Deanna [name] is a 61 y.o. 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 She was doing fairly well off prednisone after last visit. She had a good vacation trip to [place] in March. However afterwards was sick with COVID without serious complications. 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 She has had 3 sinus infections so far this year. She saw ENT late last year due to recurrent sinusitis problems. During these illnesses she has an increase in hand and wrist pains. Most recently developed burning type of pain affecting along her hairline and low back area. This comes and goes no visible changes associated. She noticed some hair loss and thinning at multiple areas during this time. She has tried avoiding any chemical exposures to her hair and scalp.   Previous HPI 04/18/21 Deanna [name] is a 61 y.o. 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 She has had ongoing upper respiratory problems since our last visit with recurrent ear infection and upper respiratory infections most of the past 3 months. She has taken several rounds of antibiotics with clearance but quick return of symptoms. She was treated with high dose prednisone once after visiting to the emergency department. She has experienced bleeding and ulceration in the nose. She has also suffered spontaneous bruising in one digit for about 2 days at a time with spontaneous resolution. She had past ENT surgery for septum defect but has not had chronic problems until all this started again after our  last visit. She has noticed a small increased in lower extremity swelling with deeper sock band indentations, no discoloration or pain or significant weight change. She had her HCQ eye exam with [facility] okay to continue HCQ but they are planning to follow up in 2-4 weeks.   Previous HPI 01/11/21 Deanna [name] is a 61 y.o. female here for follow up for UCTD after starting HCQ 400 mg PO daily and prednisone 5 mg PO daily.  Overall doing well on this but has continued the low-dose steroids.  She has not noticed problems with taking the medication.   09/07/20 Deanna [name] is a 61 y.o. female here for evaluation of positive ANA with fatigue and myalgias. Symptoms are persistent since last year with fatigue, pains in her hands and ankles and stiffness but she has had some joint pains going back for years. She typically notices swelling at the ankles by the end of the day. This is moderately painful but not debilitating. Her hand pain is worst with use during the day and she notices swelling when eating a lot of salt. 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 She does experience raynaud's symptoms with pallor in fingertips without any lesions ulcers or injuries. She denies problems with mouth and eye dryness, lymphadenopathy, chest pain, skin rashes, or photosensitive  [facility] Labs reviewed ANA 1:640 speckled CB-CAP EC4d 16 CB-CAP BC4d 90   Lupus manifestations Fatigue Arthralgia Raynaud's ?Spontaneous bruising Hx of TTP s/p splenectomy (primary vs secondary?)   Labs reviewed ANA pos Scl-70 1.3   Review of Systems  Constitutional:  Positive for fatigue.  HENT:  Negative for mouth dryness.   Eyes:  Negative for dryness.  Respiratory:  Negative for shortness of breath.   Cardiovascular:  Positive for swelling in legs/feet.  Gastrointestinal:  Negative for constipation.  Endocrine: Positive for cold  intolerance and heat intolerance.  Genitourinary:  Negative for difficulty urinating.  Musculoskeletal:  Positive for joint pain, joint pain, joint swelling, morning stiffness and muscle tenderness.  Skin:  Positive for rash.  Allergic/Immunologic: Positive for susceptible to infections.  Neurological:  Negative for numbness.  Hematological:  Positive for bruising/bleeding tendency.  Psychiatric/Behavioral:  Positive for sleep disturbance.     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   Aortic root dilatation ([place]) 08/07/2016   Ear pain 07/22/2016   Hyperlipemia 12/02/2015   H/O splenectomy 12/02/2015   Hyperglycemia 01/27/2014   Obesity 01/27/2014   Vitamin D deficiency 09/11/2012    Past Medical History:  Diagnosis Date   Allergy    GERD (gastroesophageal reflux disease)    "does not need meds anymore"   Heart murmur    Hyperlipidemia    Lactose intolerance    OSA (obstructive sleep apnea)    Positive ANA (antinuclear antibody)    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[place])     Family History  Problem Relation Age of Onset   Arthritis Mother    Hyperlipidemia Mother    Stroke Mother    Hypertension Mother    Hypertension Father    Lupus Maternal Aunt    Breast cancer Daughter    Lupus Son    Colon cancer Neg Hx    Past Surgical History:  Procedure Laterality Date   ABDOMINAL HYSTERECTOMY  2006    benign tumor, complete   KNEE ARTHROSCOPY Right    NASAL SEPTUM SURGERY     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   Social History   Social History Narrative   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: lives 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, husband and mother      Spiritual Beliefs: Christian      Lifestyle: walks a little a few days per week, diet is ok      Right Handed      Drinks Caffeine         Immunization History  Administered Date(s) Administered   Meningococcal B, OMV 12/02/2015   PFIZER Comirnaty(Gray Top)Covid-19 Tri-Sucrose Vaccine 07/19/2019, 08/09/2019, 06/16/2020   PPD Test 01/21/2018   Pneumococcal Polysaccharide-23 10/14/2010, 10/21/2014     Objective: Vital Signs: BP 139/88 (BP Location: Left Arm, Patient Position: Sitting, Cuff Size: Normal)   Pulse 69   Resp  16   Ht '5\' 9"'$  (1.753 m)   Wt 230 lb 3.2 oz (104.4 kg)   BMI 33.99 kg/m    Physical Exam Constitutional:      Appearance: She is obese.  Cardiovascular:     Rate and Rhythm: Normal rate and regular rhythm.  Pulmonary:     Effort: Pulmonary effort is normal.     Breath sounds: Normal breath sounds.  Musculoskeletal:     Right lower leg: No edema.     Left lower leg: No edema.  Skin:    General: Skin is warm and dry.  Neurological:     Mental Status: She is alert.  Psychiatric:        Mood and Affect: Mood normal.      Musculoskeletal Exam:  Elbows full ROM no tenderness or swelling Wrists full ROM no tenderness or swelling Fingers full ROM no tenderness or swelling No midline or paraspinal tenderness to pressure on exam Knees full ROM no tenderness or swelling, patellofemoral crepitus, no palpable effusions Ankles full ROM no tenderness or swelling   Investigation: No additional findings.  Imaging: No results found.  Recent Labs: Lab Results  Component Value Date   WBC 5.9 10/27/2021   HGB 11.6 (L) 10/27/2021   PLT 413 (H) 10/27/2021   NA 141  10/27/2021   K 4.3 10/27/2021   CL 105 10/27/2021   CO2 29 10/27/2021   GLUCOSE 79 10/27/2021   BUN 15 10/27/2021   CREATININE 0.79 10/27/2021   BILITOT 0.8 10/27/2021   ALKPHOS 71 10/10/2020   AST 15 10/27/2021   ALT 13 10/27/2021   PROT 7.5 10/27/2021   ALBUMIN 3.9 10/10/2020   CALCIUM 9.7 10/27/2021   GFRAA >60 11/27/2019   QFTBGOLDPLUS NEGATIVE 01/11/2021    Speciality Comments: Patient states PLQ eye exam at [facility] 05/2021  Procedures:  No procedures performed Allergies: Eggs or egg-derived products and Lactose intolerance (gi)   Assessment / Plan:     Visit Diagnoses: Undifferentiated connective tissue disease ([place]) - Plan: Sedimentation rate  Some symptoms are in exacerbation but no apparent joint synovitis. Hair thinning no evidence of scarring alopecia. Checking sed rate for inflammatory disease monitoring previously mild elevation. Plan to continue HCQ 400 mg daily I do not think she needs increase in treatment or prednisone course unless worse symptoms.  Raynaud's phenomenon without gangrene - Plan: hydroxychloroquine (PLAQUENIL) 200 MG tablet  Doing better likely improved by warmer weather, no changes on exam.  High risk medication use - Plan: CBC with Differential/Platelet, COMPLETE METABOLIC PANEL WITH GFR  Checking CBC and CMP for ongoing HCQ treatment reports ophthalmology f/u in January with no problems. media not available for review.  Orders: Orders Placed This Encounter  Procedures   Sedimentation rate   CBC with Differential/Platelet   COMPLETE METABOLIC PANEL WITH GFR   Meds ordered this encounter  Medications   hydroxychloroquine (PLAQUENIL) 200 MG tablet    Sig: Take 2 tablets (400 mg total) by mouth daily.    Dispense:  180 tablet    Refill:  1     Follow-Up Instructions: Return in about 6 months (around 04/28/2022) for UCTD on HCQ f/u 33mo.   CCollier Salina MD  Note - This record has been created using [employer]  Chart creation errors have been sought, but may not always  have been located. Such creation errors do not reflect on  the standard of medical care.

## 2021-10-28 LAB — COMPLETE METABOLIC PANEL WITH GFR
AG Ratio: 1.3 (calc) (ref 1.0–2.5)
ALT: 13 U/L (ref 6–29)
AST: 15 U/L (ref 10–35)
Albumin: 4.2 g/dL (ref 3.6–5.1)
Alkaline phosphatase (APISO): 71 U/L (ref 37–153)
BUN: 15 mg/dL (ref 7–25)
CO2: 29 mmol/L (ref 20–32)
Calcium: 9.7 mg/dL (ref 8.6–10.4)
Chloride: 105 mmol/L (ref 98–110)
Creat: 0.79 mg/dL (ref 0.50–1.03)
Globulin: 3.3 g/dL (calc) (ref 1.9–3.7)
Glucose, Bld: 79 mg/dL (ref 65–99)
Potassium: 4.3 mmol/L (ref 3.5–5.3)
Sodium: 141 mmol/L (ref 135–146)
Total Bilirubin: 0.8 mg/dL (ref 0.2–1.2)
Total Protein: 7.5 g/dL (ref 6.1–8.1)
eGFR: 87 mL/min/{1.73_m2} (ref >=60)

## 2021-10-28 LAB — CBC WITH DIFFERENTIAL/PLATELET
Absolute Monocytes: 962 cells/uL — ABNORMAL HIGH (ref 200–950)
Basophils Absolute: 83 cells/uL (ref 0–200)
Basophils Relative: 1.4 %
Eosinophils Absolute: 254 cells/uL (ref 15–500)
Eosinophils Relative: 4.3 %
HCT: 35.1 % (ref 35.0–45.0)
Hemoglobin: 11.6 g/dL — ABNORMAL LOW (ref 11.7–15.5)
Lymphs Abs: 1859 cells/uL (ref 850–3900)
MCH: 28.9 pg (ref 27.0–33.0)
MCHC: 33 g/dL (ref 32.0–36.0)
MCV: 87.3 fL (ref 80.0–100.0)
MPV: 9.6 fL (ref 7.5–12.5)
Monocytes Relative: 16.3 %
Neutro Abs: 2744 cells/uL (ref 1500–7800)
Neutrophils Relative %: 46.5 %
Platelets: 413 10*3/uL — ABNORMAL HIGH (ref 140–400)
RBC: 4.02 10*6/uL (ref 3.80–5.10)
RDW: 13.8 % (ref 11.0–15.0)
Total Lymphocyte: 31.5 %
WBC: 5.9 10*3/uL (ref 3.8–10.8)

## 2021-10-28 LAB — SEDIMENTATION RATE: Sed Rate: 6 mm/h (ref 0–30)

## 2021-10-30 NOTE — Progress Notes (Signed)
Lab results look good her sed rate is normal now. Blood count and kidney function are normal. We can continue current HCQ.

## 2021-12-26 ENCOUNTER — Other Ambulatory Visit: Payer: Self-pay | Admitting: Family Medicine

## 2021-12-26 DIAGNOSIS — Z1231 Encounter for screening mammogram for malignant neoplasm of breast: Secondary | ICD-10-CM

## 2022-01-05 ENCOUNTER — Ambulatory Visit
Admission: RE | Admit: 2022-01-05 | Discharge: 2022-01-05 | Disposition: A | Discharge status: Home / Self Care | Payer: Managed Care, Other (non HMO) | Source: Ambulatory Visit

## 2022-01-05 DIAGNOSIS — Z1231 Encounter for screening mammogram for malignant neoplasm of breast: Secondary | ICD-10-CM

## 2022-01-19 NOTE — Progress Notes (Signed)
Office Visit Note  Patient: Deanna [name]             Date of Birth: 1961/01/06           MRN: [id number]             PCP: Billie Ruddy, MD Referring: Billie Ruddy, MD Visit Date: 01/25/2022   Subjective:  Follow-up (Right leg pain and lower back pain. )   History of Present Illness: Deanna [name] is a 61 y.o. 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  Systemic symptoms are doing pretty well but is having increased trouble with pain in her low back and right leg.  Pain going down the lateral side of the leg from the hip down to the knee.  She is not seeing any visible swelling or abnormal appearance in the affected area.  She is not having any falls or severe mobility limitation.   Previous HPI 10/27/2021 Deanna [name] is a 61 y.o. 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 She was doing fairly well off prednisone after last visit. She had a good vacation trip to [place] in March. However afterwards was sick with COVID without serious complications. 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 She has had 3 sinus infections so far this year. She saw ENT late last year due to recurrent sinusitis problems. During these illnesses she has an increase in hand and wrist pains. Most recently developed burning type of pain affecting along her hairline and low back area. This comes and goes no visible changes associated. She noticed some hair loss and thinning at multiple areas during this time. She has tried avoiding any chemical exposures to her hair and scalp.    Previous HPI 04/18/21 Deanna [name] is a 61 y.o. 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 She has had ongoing upper respiratory problems since our last visit with recurrent ear infection and upper respiratory infections  most of the past 3 months. She has taken several rounds of antibiotics with clearance but quick return of symptoms. She was treated with high dose prednisone once after visiting to the emergency department. She has experienced bleeding and ulceration in the nose. She has also suffered spontaneous bruising in one digit for about 2 days at a time with spontaneous resolution. She had past ENT surgery for septum defect but has not had chronic problems until all this started again after our last visit. She has noticed a small increased in lower extremity swelling with deeper sock band indentations, no discoloration or pain or significant weight change. She had her HCQ eye exam with [facility] okay to continue HCQ but they are planning to follow up in 2-4 weeks.   Previous HPI 01/11/21 Deanna [name] is a 61 y.o. female here for follow up for UCTD after starting HCQ 400 mg PO daily and prednisone 5 mg PO daily.  Overall doing well on this but has continued the low-dose steroids.  She has not noticed problems with taking the medication.   09/07/20 Deanna [name] is a 61 y.o. female here for evaluation of positive ANA with fatigue and myalgias. Symptoms are persistent since last year with fatigue, pains in her hands and ankles and stiffness but she has had some  joint pains going back for years. She typically notices swelling at the ankles by the end of the day. This is moderately painful but not debilitating. Her hand pain is worst with use during the day and she notices swelling when eating a lot of salt. 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 She does experience raynaud's symptoms with pallor in fingertips without any lesions ulcers or injuries. She denies problems with mouth and eye dryness, lymphadenopathy, chest pain, skin rashes, or photosensitive   [facility] Labs reviewed ANA 1:640 speckled CB-CAP EC4d 16 CB-CAP BC4d 90    Lupus manifestations Fatigue Arthralgia Raynaud's ?Spontaneous bruising Hx of TTP s/p splenectomy (primary vs secondary?)   Labs reviewed ANA pos Scl-70 1.3   Review of Systems  Constitutional:  Positive for fatigue.  HENT:  Negative for mouth sores and mouth dryness.   Eyes:  Negative for dryness.  Respiratory:  Negative for shortness of breath.   Cardiovascular:  Negative for chest pain and palpitations.  Gastrointestinal:  Positive for constipation. Negative for blood in stool and diarrhea.  Endocrine: Negative for increased urination.  Genitourinary:  Negative for involuntary urination.  Musculoskeletal:  Positive for joint pain, joint pain, joint swelling, myalgias, muscle weakness, morning stiffness, muscle tenderness and myalgias. Negative for gait problem.  Skin:  Positive for rash, hair loss and sensitivity to sunlight. Negative for color change.  Allergic/Immunologic: Negative for susceptible to infections.  Neurological:  Positive for dizziness and headaches.  Hematological:  Negative for swollen glands.  Psychiatric/Behavioral:  Positive for sleep disturbance. Negative for depressed mood. The patient is not nervous/anxious.     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   Aortic root dilatation ([place]) 08/07/2016   Ear pain 07/22/2016   Hyperlipemia 12/02/2015   H/O  splenectomy 12/02/2015   Hyperglycemia 01/27/2014   Obesity 01/27/2014   Vitamin D deficiency 09/11/2012    Past Medical History:  Diagnosis Date   Allergy    GERD (gastroesophageal reflux disease)    "does not need meds anymore"   Heart murmur    Hyperlipidemia    Lactose intolerance    OSA (obstructive sleep apnea)    Positive ANA (antinuclear antibody)    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[place])     Family History  Problem Relation Age of Onset   Arthritis Mother    Hyperlipidemia Mother    Stroke Mother    Hypertension Mother    Hypertension Father    Lupus Maternal Aunt    Breast cancer Daughter    Lupus Son    Colon cancer Neg Hx    Past Surgical History:  Procedure Laterality Date   ABDOMINAL HYSTERECTOMY  2006   benign tumor, complete   KNEE ARTHROSCOPY Right    NASAL SEPTUM SURGERY     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  Social History   Social History Narrative   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: lives 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, husband and mother      Spiritual Beliefs: Christian      Lifestyle: walks a little a few days per week, diet is ok      Right Handed      Drinks Caffeine         Immunization History  Administered Date(s) Administered   Meningococcal B, OMV 12/02/2015   PFIZER Comirnaty(Gray Top)Covid-19 Tri-Sucrose Vaccine 07/19/2019, 08/09/2019, 06/16/2020   PPD Test 01/21/2018   Pneumococcal Polysaccharide-23 10/14/2010, 10/21/2014     Objective: Vital Signs: BP (!) 142/88 (BP Location: Left Arm, Patient Position: Sitting, Cuff Size: Large)   Pulse 65   Resp 14   Ht '5\' 9"'$  (1.753 m)   Wt 229 lb 12.8 oz (104.2 kg)   BMI 33.94 kg/m    Physical Exam Cardiovascular:     Rate and Rhythm: Normal rate and regular rhythm.  Pulmonary:     Effort: Pulmonary effort is normal.     Breath sounds: Normal breath sounds.  Musculoskeletal:     Right lower leg: No edema.     Left lower leg: No  edema.  Skin:    General: Skin is warm and dry.     Findings: No rash.  Neurological:     Mental Status: She is alert.  Psychiatric:        Mood and Affect: Mood normal.      Musculoskeletal Exam:  Shoulders full ROM no tenderness or swelling Elbows full ROM no tenderness or swelling Wrists full ROM no tenderness or swelling Fingers full ROM no tenderness or swelling Tenderness to pressure on the right side lumbar paraspinal muscles, tenderness to pressure on lateral hip extending down into thigh no appreciable swelling or visible change Knees full ROM no tenderness or swelling, patellofemoral crepitus present Ankles full ROM no tenderness or swelling   Investigation: No additional findings.  Imaging: XR Lumbar Spine 2-3 Views  Result Date: 01/27/2022 X-ray lumbar spine 2 view AP and lateral There is some scoliosis and disc narrowing at L3-S1.  Significant anterior osteophyte formation at L5-S1 level.  I do not see any acute bony abnormality.  No significant appearing spondylolisthesis.  No abnormal calcifications. 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  There is femoral acetabular osteoarthritis joint narrowing and surface changes more advanced along the inferior portion.  No erosions or abnormal calcifications seen.  No acute abnormality or injury. 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 EXAM: DIGITAL SCREENING BILATERAL MAMMOGRAM WITH TOMOSYNTHESIS AND CAD TECHNIQUE: Bilateral screening digital craniocaudal and mediolateral oblique mammograms were obtained. Bilateral screening digital breast tomosynthesis was performed. The images  were evaluated with computer-aided detection. COMPARISON:  Previous exam(s). ACR Breast Density Category b: There are scattered areas of fibroglandular density. FINDINGS: There are no findings suspicious for malignancy. IMPRESSION: No mammographic evidence of malignancy. A result letter of this screening mammogram will be mailed directly to the patient. RECOMMENDATION: Screening mammogram in one year. (Code:SM-B-01Y) BI-RADS CATEGORY  1: Negative. Electronically Signed   By: Dorise Bullion III  M.D.   On: 01/07/2022 18:31    Recent Labs: Lab Results  Component Value Date   WBC 5.9 10/27/2021   HGB 11.6 (L) 10/27/2021   PLT 413 (H) 10/27/2021   NA 141 10/27/2021   K 4.3 10/27/2021   CL 105 10/27/2021   CO2 29 10/27/2021   GLUCOSE 79 10/27/2021   BUN 15 10/27/2021   CREATININE 0.79 10/27/2021   BILITOT 0.8 10/27/2021   ALKPHOS 71 10/10/2020   AST 15 10/27/2021   ALT 13 10/27/2021   PROT 7.5 10/27/2021   ALBUMIN 3.9 10/10/2020   CALCIUM 9.7 10/27/2021   GFRAA >60 11/27/2019   QFTBGOLDPLUS NEGATIVE 01/11/2021    Speciality Comments: PLQ eye exam 05/20/2021 at [facility] f/u 12 months  Procedures:  No procedures performed Allergies: Eggs or egg-derived products and Lactose intolerance (gi)   Assessment / Plan:     Visit Diagnoses: Undifferentiated connective tissue disease ([place])  Overall symptoms appear well controlled since I think her back and hip pain are noninflammatory rainout symptoms are doing well.  Continuing hydroxychloroquine 400 mg daily.  High risk medication use -  HCQ 400 mg daily   Most recent labs reviewed from June looks fine and ophthalmology exam in February okay for 1 year follow-up.  Raynaud's phenomenon without gangrene  Symptoms are currently minimal no digital changes on exam.  Pain in right leg - Plan: XR HIP UNILAT W OR W/O PELVIS 2-3 VIEWS RIGHT, XR Lumbar Spine 2-3 Views, cyclobenzaprine (FLEXERIL) 10 MG tablet, DISCONTINUED:  cyclobenzaprine (FLEXERIL) 10 MG tablet  The pain does not appear to be inflammatory joint disease question existing hip arthritis versus muscle and tendon source.  X-ray of hip and low back does show a moderate osteoarthritis.  However the very lateral distribution and paraspinal muscle tenderness suggest at least partly muscular source.  We will try having her use the Flexeril 10 mg in evening and range of motion exercises.  Orders: Orders Placed This Encounter  Procedures   XR HIP UNILAT W OR W/O PELVIS 2-3 VIEWS RIGHT   XR Lumbar Spine 2-3 Views   Meds ordered this encounter  Medications   DISCONTD: cyclobenzaprine (FLEXERIL) 10 MG tablet    Sig: Take 1 tablet (10 mg total) by mouth 3 (three) times daily as needed for muscle spasms.    Dispense:  30 tablet    Refill:  0   cyclobenzaprine (FLEXERIL) 10 MG tablet    Sig: Take 1 tablet (10 mg total) by mouth at bedtime as needed for muscle spasms.    Dispense:  30 tablet    Refill:  1     Follow-Up Instructions: Return in about 3 months (around 04/26/2022) for RA/UCTD/OA on HCQ f/u 66mo.   CCollier Salina MD  Note - This record has been created using [employer]  Chart creation errors have been sought, but may not always  have been located. Such creation errors do not reflect on  the standard of medical care.

## 2022-01-25 ENCOUNTER — Ambulatory Visit: Payer: Managed Care, Other (non HMO)

## 2022-01-25 ENCOUNTER — Ambulatory Visit: Payer: Managed Care, Other (non HMO) | Attending: Internal Medicine | Admitting: Internal Medicine

## 2022-01-25 ENCOUNTER — Encounter: Payer: Self-pay | Admitting: Pulmonary Disease

## 2022-01-25 ENCOUNTER — Encounter: Payer: Self-pay | Admitting: Internal Medicine

## 2022-01-25 ENCOUNTER — Ambulatory Visit (INDEPENDENT_AMBULATORY_CARE_PROVIDER_SITE_OTHER): Payer: 59 | Admitting: Pulmonary Disease

## 2022-01-25 VITALS — BP 132/82 | HR 64 | Temp 98.4°F | Ht 69.0 in | Wt 229.2 lb

## 2022-01-25 VITALS — BP 142/88 | HR 65 | Resp 14 | Ht 69.0 in | Wt 229.8 lb

## 2022-01-25 DIAGNOSIS — M359 Systemic involvement of connective tissue, unspecified: Secondary | ICD-10-CM

## 2022-01-25 DIAGNOSIS — G4733 Obstructive sleep apnea (adult) (pediatric): Secondary | ICD-10-CM

## 2022-01-25 DIAGNOSIS — Z79899 Other long term (current) drug therapy: Secondary | ICD-10-CM | POA: Diagnosis not present

## 2022-01-25 DIAGNOSIS — M79604 Pain in right leg: Secondary | ICD-10-CM | POA: Diagnosis not present

## 2022-01-25 DIAGNOSIS — I7300 Raynaud's syndrome without gangrene: Secondary | ICD-10-CM | POA: Diagnosis not present

## 2022-01-25 DIAGNOSIS — J310 Chronic rhinitis: Secondary | ICD-10-CM

## 2022-01-25 MED ORDER — CYCLOBENZAPRINE HCL 10 MG PO TABS: 10.0000 mg | ORAL_TABLET | Freq: Every evening | ORAL | 1 refills | Status: DC | PRN

## 2022-01-25 MED ORDER — CYCLOBENZAPRINE HCL 10 MG PO TABS: 10.0000 mg | ORAL_TABLET | Freq: Three times a day (TID) | ORAL | 0 refills | Status: DC | PRN

## 2022-01-25 NOTE — Patient Instructions (Signed)
Try using saline nasal spray and increase your CPAP humidifier setting.  You can use flonase at night and astepro in the morning as needed to help with sinus congestion.  Limit how much you use afrin.  If you have more trouble with nose bleeds, then you might need to follow up with Dr. Melida Quitter with ENT.  Follow up in 1 year.

## 2022-01-25 NOTE — Progress Notes (Signed)
[facility]  Chief Complaint  Patient presents with   Follow-up    Pt states she has not been having problems with her machine but states she has not used it within the past 2 weeks due to having problems with nosebleeds due to sinus problems.    Constitutional:  BP 132/82 (BP Location: Left Arm, Patient Position: Sitting, Cuff Size: Large)   Pulse 64   Temp 98.4 F (36.9 C) (Oral)   Ht '5\' 9"'$  (1.753 m)   Wt 229 lb 3.2 oz (104 kg)   SpO2 100% Comment: RA  BMI 33.85 kg/m   Past Medical History:  GERD, Allergies, HLD, Lactose intolerance, Thrombotic thrombocytopenic purpura, Positive ANA  Past Surgical History:  She  has a past surgical history that includes Splenectomy (1987); Abdominal hysterectomy (2006); Knee arthroscopy (Right); and Nasal septum surgery.  Brief Summary:  Deanna [name] is a 61 y.o. female with obstructive sleep apnea.      Subjective:   She gets sinus congestion for a week every month.  She started getting nose bleeds a couple weeks ago.  Last one was 5 days ago.  She saw Dr. Redmond Baseman with ENT in January.  She uses afrin intermittently.  She wasn't having trouble with CPAP until she got nose bleeds.   Physical Exam:   Appearance - well kempt   ENMT - no sinus tenderness, no oral exudate, no LAN, Mallampati 4 airway, no stridor, enlarged tongue  Respiratory - equal breath sounds bilaterally, no wheezing or rales  CV - s1s2 regular rate and rhythm, no murmurs  Ext - no clubbing, no edema  Skin - no rashes  Psych - normal mood and affect     Sleep Tests:  PSG 11/26/20 >> AHI 13.7, SpO2 low 87% Auto CPAP 02/23/21 to 03/24/21 >> used on 30 of 30 nights with average 4 hrs 14 min.  Average AHI 0.6 with median CPAP 6 and 95 th percentile CPAP 8 cm H2O.  Cardiac Tests:  Echo 07/28/16 >> EF 60 to 65%, grade 1 DD, mild LVH, mild AR, aortic root 38 mm  Social History:  She  reports that she has never  smoked. She has never used smokeless tobacco. She reports that she does not drink alcohol and does not use drugs.  Family History:  Her family history includes Arthritis in her mother; Breast cancer in her daughter; Hyperlipidemia in her mother; Hypertension in her father and mother; Lupus in her maternal aunt and son; Stroke in her mother.     Assessment/Plan:   Obstructive sleep apnea. - she is compliant with CPAP and reports benefit from therapy - she uses [employer] for her DME - current CPAP ordered August 2022 - continue auto CPAP 5 to 15 cm H2O  Recurrent sinusitis with chronic rhinitis and epistaxis. - she can try nasal saline spray or gel, and increase CPAP humidifier setting - she can try flonase at night and astepro in the morning prn - previously seen by Dr. 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 - followed by Dr. Vernelle Emerald with [facility]  Time Spent Involved in Patient Care on Day of Examination:  26 minutes  Follow up:   Patient Instructions  Try using saline nasal spray and increase your CPAP humidifier setting.  You can use flonase at night and astepro in the morning as needed to help with sinus congestion.  Limit how much you use afrin.  If  you have more trouble with nose bleeds, then you might need to follow up with Dr. Melida Quitter with ENT.  Follow up in 1 year.  Medication List:   Allergies as of 01/25/2022       Reactions   Eggs Or Egg-derived Products Nausea And Vomiting   Lactose Intolerance (gi) Other (See Comments)   vomiting        Medication List        Accurate as of January 25, 2022 10:17 AM. If you have any questions, ask your nurse or doctor.          ALEVE PO Take by mouth as needed.   benzonatate 100 MG capsule Commonly known as: TESSALON Take 1 capsule (100 mg total) by mouth 2 (two) times daily as needed for cough.   Cholecalciferol 100 MCG (4000 UT) Caps Take by mouth daily.    clopidogrel 75 MG tablet Commonly known as: PLAVIX Take 1 tablet (75 mg total) by mouth daily.   fexofenadine 180 MG tablet Commonly known as: ALLEGRA TAKE 1 TABLET BY MOUTH EVERY DAY   hydroxychloroquine 200 MG tablet Commonly known as: PLAQUENIL Take 2 tablets (400 mg total) by mouth daily.   IRON PO Take by mouth.        Signature:  Chesley Mires, MD [place] Pager - [phone] 01/25/2022, 10:17 AM

## 2022-01-29 NOTE — Progress Notes (Signed)
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 I am still suspicious about muscular source for the hip pain. If she does not see improvement with the muscle relaxer drug and within a few weeks, next steps would be to consider physical therapy or trial of local injection.

## 2022-01-31 ENCOUNTER — Telehealth: Payer: Self-pay | Admitting: *Deleted

## 2022-01-31 NOTE — Telephone Encounter (Signed)
Patient called the office to review her x-rays. 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 Dr.Rice is still suspicious about muscular source for the hip pain. If she does not see improvement with the muscle relaxer drug and within a few weeks, next steps would be to consider physical therapy or trial of local injection. Patient expressed understanding.

## 2022-02-06 ENCOUNTER — Telehealth: Payer: Self-pay

## 2022-02-06 DIAGNOSIS — M3119 Other thrombotic microangiopathy: Secondary | ICD-10-CM

## 2022-02-06 NOTE — Telephone Encounter (Signed)
Patient called and says she is bruising more than usual and wants to have blood work done. Please advise, thanks!

## 2022-02-07 NOTE — Telephone Encounter (Signed)
Left message for patient to call us back.  

## 2022-02-07 NOTE — Telephone Encounter (Signed)
Advised patient that we can recheck her blood count and a peripheral blood smear to rule out problems with her platelet count or TTP history. Advised that Dr. Benjamine Mola can place orders for a lab visit for this if she wants to check.   Advised patient that she is also on plavix and this can cause easy bruising. Advised that Dr. Benjamine Mola does not typically monitor or interpret tests for this so she would have to ask her neurologist or other prescriber about it.   Patient states that she would like to come in tomorrow for lab work.

## 2022-02-07 NOTE — Telephone Encounter (Signed)
We can recheck her blood count and a peripheral blood smear to rule out problems with her platelet count or TTP history. I can place orders for a lab visit for this if she wants to check.  She is also on plavix and this can cause easy bruising. I do not typically monitor or interpret tests for this so would have to ask her neurologist or other prescriber about it.

## 2022-02-08 ENCOUNTER — Other Ambulatory Visit: Payer: Self-pay | Admitting: *Deleted

## 2022-02-08 DIAGNOSIS — M3119 Other thrombotic microangiopathy: Secondary | ICD-10-CM

## 2022-02-08 NOTE — Addendum Note (Signed)
Addended by: Collier Salina on: 02/08/2022 08:07 AM   Modules accepted: Orders

## 2022-02-08 NOTE — Telephone Encounter (Signed)
Patient called office back and was advised that she can come into the office for labs at her convenience.

## 2022-02-08 NOTE — Telephone Encounter (Signed)
Left message advising patient that Dr. Benjamine Mola has placed future lab orders for her and she can come whenever during lab hours.

## 2022-02-08 NOTE — Telephone Encounter (Signed)
I placed future lab orders for her, she can come whenever.

## 2022-02-09 LAB — CBC WITH DIFFERENTIAL/PLATELET
Absolute Monocytes: 842 cells/uL (ref 200–950)
Basophils Absolute: 79 cells/uL (ref 0–200)
Basophils Relative: 1.1 %
Eosinophils Absolute: 202 cells/uL (ref 15–500)
Eosinophils Relative: 2.8 %
HCT: 34.6 % — ABNORMAL LOW (ref 35.0–45.0)
Hemoglobin: 11.5 g/dL — ABNORMAL LOW (ref 11.7–15.5)
Lymphs Abs: 2282 cells/uL (ref 850–3900)
MCH: 29 pg (ref 27.0–33.0)
MCHC: 33.2 g/dL (ref 32.0–36.0)
MCV: 87.2 fL (ref 80.0–100.0)
MPV: 10 fL (ref 7.5–12.5)
Monocytes Relative: 11.7 %
Neutro Abs: 3794 cells/uL (ref 1500–7800)
Neutrophils Relative %: 52.7 %
Platelets: 397 10*3/uL (ref 140–400)
RBC: 3.97 10*6/uL (ref 3.80–5.10)
RDW: 13.1 % (ref 11.0–15.0)
Total Lymphocyte: 31.7 %
WBC: 7.2 10*3/uL (ref 3.8–10.8)

## 2022-02-09 LAB — PATHOLOGIST SMEAR REVIEW

## 2022-02-12 NOTE — Progress Notes (Signed)
Platelet number and appearance looks fine, no evidence of TTP acting up.  Her blood cell shape on the review suggests possible iron deficiency. I don't see any recent iron tests for comparison.   Does she take any supplement with iron in it? If she does- we could check an iron level to see if it is normal on the current amount. If not- could try just starting to take one for now, the 325 mg OTC is fine.

## 2022-02-13 ENCOUNTER — Other Ambulatory Visit: Payer: Self-pay | Admitting: *Deleted

## 2022-02-13 DIAGNOSIS — R233 Spontaneous ecchymoses: Secondary | ICD-10-CM

## 2022-02-13 DIAGNOSIS — M3119 Other thrombotic microangiopathy: Secondary | ICD-10-CM

## 2022-02-13 DIAGNOSIS — I83893 Varicose veins of bilateral lower extremities with other complications: Secondary | ICD-10-CM

## 2022-02-13 DIAGNOSIS — Z79899 Other long term (current) drug therapy: Secondary | ICD-10-CM

## 2022-02-13 DIAGNOSIS — I7300 Raynaud's syndrome without gangrene: Secondary | ICD-10-CM

## 2022-02-13 DIAGNOSIS — E559 Vitamin D deficiency, unspecified: Secondary | ICD-10-CM

## 2022-02-13 DIAGNOSIS — R768 Other specified abnormal immunological findings in serum: Secondary | ICD-10-CM

## 2022-02-13 DIAGNOSIS — R600 Localized edema: Secondary | ICD-10-CM

## 2022-02-13 DIAGNOSIS — M359 Systemic involvement of connective tissue, unspecified: Secondary | ICD-10-CM

## 2022-02-15 ENCOUNTER — Other Ambulatory Visit: Payer: Self-pay

## 2022-02-15 DIAGNOSIS — R233 Spontaneous ecchymoses: Secondary | ICD-10-CM

## 2022-02-15 DIAGNOSIS — I7300 Raynaud's syndrome without gangrene: Secondary | ICD-10-CM

## 2022-02-15 DIAGNOSIS — R768 Other specified abnormal immunological findings in serum: Secondary | ICD-10-CM

## 2022-02-15 DIAGNOSIS — R600 Localized edema: Secondary | ICD-10-CM

## 2022-02-15 DIAGNOSIS — I83893 Varicose veins of bilateral lower extremities with other complications: Secondary | ICD-10-CM

## 2022-02-15 DIAGNOSIS — M3119 Other thrombotic microangiopathy: Secondary | ICD-10-CM

## 2022-02-15 DIAGNOSIS — E559 Vitamin D deficiency, unspecified: Secondary | ICD-10-CM

## 2022-02-15 DIAGNOSIS — M359 Systemic involvement of connective tissue, unspecified: Secondary | ICD-10-CM

## 2022-02-15 DIAGNOSIS — Z79899 Other long term (current) drug therapy: Secondary | ICD-10-CM

## 2022-02-16 LAB — IRON,TIBC AND FERRITIN PANEL
%SAT: 23 % (calc) (ref 16–45)
Ferritin: 100 ng/mL (ref 16–232)
Iron: 71 ug/dL (ref 45–160)
TIBC: 309 mcg/dL (calc) (ref 250–450)

## 2022-04-26 ENCOUNTER — Encounter: Payer: Self-pay | Admitting: Internal Medicine

## 2022-04-26 ENCOUNTER — Ambulatory Visit: Payer: Managed Care, Other (non HMO) | Attending: Internal Medicine | Admitting: Internal Medicine

## 2022-04-26 VITALS — BP 134/87 | HR 85 | Resp 12 | Ht 69.0 in | Wt 230.0 lb

## 2022-04-26 DIAGNOSIS — I7300 Raynaud's syndrome without gangrene: Secondary | ICD-10-CM

## 2022-04-26 DIAGNOSIS — M3119 Other thrombotic microangiopathy: Secondary | ICD-10-CM

## 2022-04-26 DIAGNOSIS — M359 Systemic involvement of connective tissue, unspecified: Secondary | ICD-10-CM | POA: Diagnosis not present

## 2022-04-26 DIAGNOSIS — R768 Other specified abnormal immunological findings in serum: Secondary | ICD-10-CM

## 2022-04-26 MED ORDER — HYDROXYCHLOROQUINE SULFATE 200 MG PO TABS: 400.0000 mg | ORAL_TABLET | Freq: Every day | ORAL | 1 refills | Status: DC

## 2022-04-26 NOTE — Progress Notes (Signed)
Office Visit Note  Patient: Deanna [name]             Date of Birth: [birth year]           MRN: [id number]             PCP: Billie Ruddy, MD Referring: Billie Ruddy, MD Visit Date: 04/26/2022   Subjective:   History of Present Illness: Deanna [name] is a 61 y.o. 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  Since her last visit her lateral hip pain is doing better.  She took a Flexeril for short time which was beneficial but noticed a lot of drowsiness or loopy sensation side effects so stopped taking this by now.  Biggest issue currently is her sleep.  Has a lot of fatigue and increase in stiffness associated with fragmented sleep.  She wakes up by 3 AM every morning and cannot fall back asleep or remaining asleep.  She is not taking naps during the day and is trying to follow a consistent sleep schedule without much improvement.  She has known OSA and reports consistent use of her CPAP.  Previous HPI 01/25/22 Deanna [name] is a 61 y.o. 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  Systemic symptoms are doing pretty well but is having increased trouble with pain in her low back and right leg.  Pain going down the lateral side of the leg from the hip down to the knee.  She is not seeing any visible swelling or abnormal appearance in the affected area.  She is not having any falls or severe mobility limitation.     Previous HPI 10/27/2021 Deanna [name] is a 61 y.o. 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 She was doing fairly well off prednisone after last visit. She had a good vacation trip to [place] in March. However afterwards was sick with COVID without serious complications. 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 She has had 3 sinus infections so far this year. She saw ENT late  last year due to recurrent sinusitis problems. During these illnesses she has an increase in hand and wrist pains. Most recently developed burning type of pain affecting along her hairline and low back area. This comes and goes no visible changes associated. She noticed some hair loss and thinning at multiple areas during this time. She has tried avoiding any chemical exposures to her hair and scalp.    Previous HPI 04/18/21 Deanna [name] is a 61 y.o. 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 She has had ongoing upper respiratory problems since our last visit with recurrent ear infection and upper respiratory infections most of the past 3 months. She has taken several rounds of antibiotics with clearance but quick return of symptoms. She was treated with high dose prednisone once after visiting to the emergency department. She has experienced bleeding and ulceration in the nose. She has also suffered spontaneous bruising in one digit for about 2 days at a time with spontaneous resolution. She had past ENT surgery for septum defect but has not had chronic problems until all this started again after our last visit. She has noticed a small increased in lower extremity swelling with deeper sock band  indentations, no discoloration or pain or significant weight change. She had her HCQ eye exam with [facility] okay to continue HCQ but they are planning to follow up in 2-4 weeks.   Previous HPI 01/11/21 Deanna [name] is a 61 y.o. female here for follow up for UCTD after starting HCQ 400 mg PO daily and prednisone 5 mg PO daily.  Overall doing well on this but has continued the low-dose steroids.  She has not noticed problems with taking the medication.   09/07/20 Deanna [name] is a 61 y.o. female here for evaluation of positive ANA with fatigue and myalgias. Symptoms are persistent since last year with fatigue, pains in her hands and  ankles and stiffness but she has had some joint pains going back for years. She typically notices swelling at the ankles by the end of the day. This is moderately painful but not debilitating. Her hand pain is worst with use during the day and she notices swelling when eating a lot of salt. 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 She does experience raynaud's symptoms with pallor in fingertips without any lesions ulcers or injuries. She denies problems with mouth and eye dryness, lymphadenopathy, chest pain, skin rashes, or photosensitive   [facility] Labs reviewed ANA 1:640 speckled CB-CAP EC4d 16 CB-CAP BC4d 90   Lupus manifestations Fatigue Arthralgia Raynaud's ?Spontaneous bruising Hx of TTP s/p splenectomy (primary vs secondary?)   Labs reviewed ANA pos Scl-70 1.3   Review of Systems  Constitutional:  Positive for fatigue.  HENT:  Positive for mouth sores. Negative for mouth dryness.   Eyes:  Negative for dryness.  Respiratory:  Negative for shortness of breath.   Cardiovascular:  Negative for chest pain and palpitations.  Gastrointestinal:  Positive for constipation. Negative for blood in stool and diarrhea.  Endocrine: Negative for increased urination.  Genitourinary:  Negative for involuntary urination.  Musculoskeletal:  Positive for joint pain, gait problem, joint pain, joint swelling, myalgias, muscle weakness, morning stiffness, muscle tenderness and myalgias.  Skin:  Positive for sensitivity to sunlight. Negative for color change, rash and hair loss.  Allergic/Immunologic: Positive for susceptible to infections.  Neurological:  Positive for dizziness. Negative for headaches.  Hematological:  Negative for swollen glands.  Psychiatric/Behavioral:  Positive for sleep disturbance. Negative for depressed mood. The patient is nervous/anxious.     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   Aortic root dilatation ([place]) 08/07/2016   Ear pain 07/22/2016   Hyperlipemia 12/02/2015   H/O splenectomy 12/02/2015   Hyperglycemia 01/27/2014   Obesity 01/27/2014   Vitamin D deficiency 09/11/2012    Past Medical History:  Diagnosis Date   Allergy    GERD (gastroesophageal reflux disease)    "does not need meds anymore"   Heart murmur    Hyperlipidemia  Lactose intolerance    OSA (obstructive sleep apnea)    Positive ANA (antinuclear antibody)    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HCC)     Family History  Problem Relation Age of Onset   Arthritis Mother    Hyperlipidemia Mother    Stroke Mother    Hypertension Mother    Hypertension Father    Lupus Maternal Aunt    Breast cancer Daughter    Lupus Son    Colon cancer Neg Hx    Past Surgical History:  Procedure Laterality Date   ABDOMINAL HYSTERECTOMY  2006   benign tumor, complete   KNEE ARTHROSCOPY Right    NASAL SEPTUM SURGERY     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   Social History   Social History Narrative   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: lives 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, husband and mother      Spiritual Beliefs: Christian      Lifestyle: walks a little a few days per week, diet is ok      Right Handed      Drinks Caffeine         Immunization History  Administered Date(s)  Administered   Meningococcal B, OMV 12/02/2015   PFIZER Comirnaty(Gray Top)Covid-19 Tri-Sucrose Vaccine 07/19/2019, 08/09/2019, 06/16/2020   PPD Test 01/21/2018   Pneumococcal Polysaccharide-23 10/14/2010, 10/21/2014     Objective: Vital Signs: BP 134/87 (BP Location: Left Arm, Patient Position: Sitting, Cuff Size: Large)   Pulse 85   Resp 12   Ht '5\' 9"'$  (1.753 m)   Wt 230 lb (104.3 kg)   BMI 33.97 kg/m    Physical Exam Constitutional:      Appearance: She is obese.  Eyes:     Conjunctiva/sclera: Conjunctivae normal.  Cardiovascular:     Rate and Rhythm: Normal rate and regular rhythm.  Pulmonary:     Effort: Pulmonary effort is normal.     Breath sounds: Normal breath sounds.  Lymphadenopathy:     Cervical: No cervical adenopathy.  Skin:    General: Skin is warm and dry.  Neurological:     Mental Status: She is alert.  Psychiatric:        Mood and Affect: Mood normal.      Musculoskeletal Exam:  Neck full ROM no tenderness Shoulders full ROM no tenderness or swelling Elbows full ROM no tenderness or swelling Wrists full ROM no tenderness or swelling Fingers full ROM no tenderness or swelling Mild right lumbar paraspinal muscle tenderness, no radiation, no right lateral hip tenderness, left side normal Knees full ROM no tenderness or swelling, patellofemoral crepitus b/l     Investigation: No additional findings.  Imaging: No results found.  Recent Labs: Lab Results  Component Value Date   WBC 8.2 04/26/2022   HGB 11.7 04/26/2022   PLT 439 (H) 04/26/2022   NA 141 04/26/2022   K 4.5 04/26/2022   CL 104 04/26/2022   CO2 28 04/26/2022   GLUCOSE 79 04/26/2022   BUN 15 04/26/2022   CREATININE 0.91 04/26/2022   BILITOT 0.7 04/26/2022   ALKPHOS 71 10/10/2020   AST 16 04/26/2022   ALT 12 04/26/2022   PROT 8.1 04/26/2022   ALBUMIN 3.9 10/10/2020   CALCIUM 10.0 04/26/2022   GFRAA >60 11/27/2019   QFTBGOLDPLUS NEGATIVE 01/11/2021    Speciality  Comments: PLQ eye exam 05/20/2021 at [facility] f/u 12 months  Procedures:  No procedures  performed Allergies: Eggs or egg-derived products and Lactose intolerance (gi)   Assessment / Plan:     Visit Diagnoses: Undifferentiated connective tissue disease ([place]) - Plan: Sedimentation rate, COMPLETE METABOLIC PANEL WITH GFR, CBC with Differential/Platelet  Inflammatory symptoms appear well-controlled today no rashes no peripheral joint synovitis and joint pain is pretty minimal overall.  Will recheck sedimentation rate for disease activity monitoring.  Also rechecking CBC and CMP due to history of TTP which may be associated.  Had fairly recent interval monitoring with normal iron studies.  Plan to continue hydroxychloroquine 400 mg daily.  She is planned for eye exam needing OCT to be done in January next year.  Raynaud's phenomenon without gangrene - Plan: hydroxychloroquine (PLAQUENIL) 200 MG tablet  No increasing complications so far no concerning nail or finger changes.  She is on hydroxychloroquine and Plavix which may be beneficial for secondary contributions.  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[place])  Did have some issues with easy bruising since her last visit she came in for updated labs these were reassuring with no evidence of problem in her platelet counts.  Suspect related to ongoing Plavix.  Orders: Orders Placed This Encounter  Procedures   Sedimentation rate   COMPLETE METABOLIC PANEL WITH GFR   CBC with Differential/Platelet   Meds ordered this encounter  Medications   hydroxychloroquine (PLAQUENIL) 200 MG tablet    Sig: Take 2 tablets (400 mg total) by mouth daily.    Dispense:  180 tablet    Refill:  1     Follow-Up Instructions: Return in about 6 months (around 10/26/2022) for UCTD/RA on HCQ f/u 32mo.   CCollier Salina MD  Note - This record has been created using [employer]  Chart creation errors have been sought, but may not  always  have been located. Such creation errors do not reflect on  the standard of medical care.

## 2022-04-27 LAB — COMPLETE METABOLIC PANEL WITH GFR
AG Ratio: 1.1 (calc) (ref 1.0–2.5)
ALT: 12 U/L (ref 6–29)
AST: 16 U/L (ref 10–35)
Albumin: 4.3 g/dL (ref 3.6–5.1)
Alkaline phosphatase (APISO): 75 U/L (ref 37–153)
BUN: 15 mg/dL (ref 7–25)
CO2: 28 mmol/L (ref 20–32)
Calcium: 10 mg/dL (ref 8.6–10.4)
Chloride: 104 mmol/L (ref 98–110)
Creat: 0.91 mg/dL (ref 0.50–1.03)
Globulin: 3.8 g/dL (calc) — ABNORMAL HIGH (ref 1.9–3.7)
Glucose, Bld: 79 mg/dL (ref 65–99)
Potassium: 4.5 mmol/L (ref 3.5–5.3)
Sodium: 141 mmol/L (ref 135–146)
Total Bilirubin: 0.7 mg/dL (ref 0.2–1.2)
Total Protein: 8.1 g/dL (ref 6.1–8.1)
eGFR: 73 mL/min/{1.73_m2} (ref >=60)

## 2022-04-27 LAB — CBC WITH DIFFERENTIAL/PLATELET
Absolute Monocytes: 894 cells/uL (ref 200–950)
Basophils Absolute: 82 cells/uL (ref 0–200)
Basophils Relative: 1 %
Eosinophils Absolute: 172 cells/uL (ref 15–500)
Eosinophils Relative: 2.1 %
HCT: 34.5 % — ABNORMAL LOW (ref 35.0–45.0)
Hemoglobin: 11.7 g/dL (ref 11.7–15.5)
Lymphs Abs: 2698 cells/uL (ref 850–3900)
MCH: 28.7 pg (ref 27.0–33.0)
MCHC: 33.9 g/dL (ref 32.0–36.0)
MCV: 84.8 fL (ref 80.0–100.0)
MPV: 9.9 fL (ref 7.5–12.5)
Monocytes Relative: 10.9 %
Neutro Abs: 4354 cells/uL (ref 1500–7800)
Neutrophils Relative %: 53.1 %
Platelets: 439 10*3/uL — ABNORMAL HIGH (ref 140–400)
RBC: 4.07 10*6/uL (ref 3.80–5.10)
RDW: 13.1 % (ref 11.0–15.0)
Total Lymphocyte: 32.9 %
WBC: 8.2 10*3/uL (ref 3.8–10.8)

## 2022-04-27 LAB — SEDIMENTATION RATE: Sed Rate: 38 mm/h — ABNORMAL HIGH (ref 0–30)

## 2022-05-16 ENCOUNTER — Other Ambulatory Visit: Payer: Self-pay | Admitting: Neurology

## 2022-05-16 DIAGNOSIS — R200 Anesthesia of skin: Secondary | ICD-10-CM

## 2022-05-19 ENCOUNTER — Ambulatory Visit (INDEPENDENT_AMBULATORY_CARE_PROVIDER_SITE_OTHER): Payer: Managed Care, Other (non HMO) | Admitting: Family Medicine

## 2022-05-19 ENCOUNTER — Encounter: Payer: Self-pay | Admitting: Family Medicine

## 2022-05-19 VITALS — BP 134/70 | HR 76 | Temp 98.9°F | Wt 233.4 lb

## 2022-05-19 DIAGNOSIS — J019 Acute sinusitis, unspecified: Secondary | ICD-10-CM

## 2022-05-19 DIAGNOSIS — J040 Acute laryngitis: Secondary | ICD-10-CM | POA: Diagnosis not present

## 2022-05-19 MED ORDER — AMOXICILLIN-POT CLAVULANATE 500-125 MG PO TABS: 1.0000 | ORAL_TABLET | Freq: Two times a day (BID) | ORAL | 0 refills | Status: AC

## 2022-05-19 NOTE — Progress Notes (Signed)
   Acute Office Visit  Subjective:     Patient ID: Deanna [name], female    DOB: 24-Mar-1961, 62 y.o.   MRN: [id number]  Chief Complaint  Patient presents with   Nasal Congestion    Pt reports sx of hoarseness, nasal congestion and postal drainage. Going on to 3rd wks. Denied fever, bodyache. Using nasal spray, take sudafed and aleve.   Hoarse    HPI Patient is in today for ongoing concern.  Patient endorses nasal congestion, postnasal drainage x 2 weeks.  Endorses mild headache at times and hoarse voice.  Tried Sudafed and Aleve for symptoms.  Denies sore throat, ear pain/pressure.  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  ROS Positive symptoms as noted above.     Objective:    BP 134/70 (BP Location: Right Arm, Patient Position: Sitting, Cuff Size: Large)   Pulse 76   Temp 98.9 F (37.2 C) (Oral)   Wt 233 lb 6.4 oz (105.9 kg)   SpO2 96%   BMI 34.47 kg/m    Physical Exam Gen. Pleasant, well developed, well-nourished, in NAD HEENT - [place]/AT, PERRL, EOMI, conjunctive clear, no scleral icterus, no nasal drainage, pharynx with clear drainage, mild erythema, no exudate.  No TTP of sinuses.  TMs full bilaterally. Lungs: no use of accessory muscles, CTAB, no wheezes, rales or rhonchi Cardiovascular: RRR,  No r/g/m, no peripheral edema Neuro:  A&Ox3, CN II-XII intact, normal gait  No results found for any visits on 05/19/22.     Assessment & Plan:   Problem List Items Addressed This Visit   None Visit Diagnoses     Subacute sinusitis, unspecified location    -  Primary   Relevant Medications   amoxicillin-clavulanate (AUGMENTIN) 500-125 MG tablet   Laryngitis           Meds ordered this encounter  Medications   amoxicillin-clavulanate (AUGMENTIN) 500-125 MG tablet    Sig: Take 1 tablet by mouth in the morning and at bedtime for 7 days.    Dispense:  14 tablet    Refill:  0   Given continued symptoms start ABX for sinusitis.  Vocal rest,  gargling, drinking warm fluids and honey advised for laryngitis.  OTC antihistamine for postnasal drainage.  Given strict precautions.  Return if symptoms worsen or fail to improve.  Billie Ruddy, MD

## 2022-05-24 ENCOUNTER — Telehealth (INDEPENDENT_AMBULATORY_CARE_PROVIDER_SITE_OTHER): Payer: Managed Care, Other (non HMO) | Admitting: Family Medicine

## 2022-05-24 ENCOUNTER — Encounter: Payer: Managed Care, Other (non HMO) | Admitting: Family Medicine

## 2022-05-24 ENCOUNTER — Encounter: Payer: Self-pay | Admitting: Family Medicine

## 2022-05-24 DIAGNOSIS — U071 COVID-19: Secondary | ICD-10-CM

## 2022-05-24 MED ORDER — MOLNUPIRAVIR EUA 200MG CAPSULE: 4.0000 | ORAL_CAPSULE | Freq: Two times a day (BID) | ORAL | 0 refills | Status: AC

## 2022-05-24 NOTE — Progress Notes (Signed)
Virtual Visit via Video Note  I connected with Deanna [name] on 05/24/22 at  8:45 AM EST by a video enabled telemedicine application 2/2 [id number] pandemic and verified that I am speaking with the correct person using two identifiers.  Location patient: home Location provider:work or home office Persons participating in the virtual visit: patient, provider  I discussed the limitations of evaluation and management by telemedicine and the availability of in person appointments. The patient expressed understanding and agreed to proceed.  Chief Complaint  Patient presents with   Covid Positive    Pt reports she tested with positive covid yesterday. Has sx of headache, coughing up yellow phlegm- subsided, runny nose, fever, fatigue. Denied chills and bodyache. On abx from ov on 05/19/2022 and taking aleve for headache.     HPI: Pt seen last wk 05/19/22 for sinusitis. Started on Augmentin.  States was feeling better then developed a really bad HA on Mon 05/22/22 with congestion.  Symptoms continued with subjective fever, chills, cough.  HA improved.  Denies Sore throat, ear pain pressure, diarrhea. Decreased appetite.  Staying hydrated. Pt with positive COVID test 05/22/22.  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  ROS: See pertinent positives and negatives per HPI.  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    Family History  Problem Relation Age of Onset   Arthritis Mother    Hyperlipidemia Mother    Stroke Mother    Hypertension Mother     Hypertension Father    Lupus Maternal Aunt    Breast cancer Daughter    Lupus Son    Colon cancer Neg Hx     Current Outpatient Medications:    amoxicillin-clavulanate (AUGMENTIN) 500-125 MG tablet, Take 1 tablet by mouth in the morning and at bedtime for 7 days., Disp: 14 tablet, Rfl: 0   benzonatate (TESSALON) 100 MG capsule, Take 1 capsule (100 mg total) by mouth 2 (two) times daily as needed for cough., Disp: 20 capsule, Rfl: 0   Cholecalciferol 100 MCG (4000 UT) CAPS, Take by mouth daily., Disp: , Rfl:    clopidogrel (PLAVIX) 75 MG tablet, TAKE 1 TABLET BY MOUTH EVERY DAY, Disp: 90 tablet, Rfl: 1   cyclobenzaprine (FLEXERIL) 10 MG tablet, Take 1 tablet (10 mg total) by mouth at bedtime as needed for muscle spasms., Disp: 30 tablet, Rfl: 1   Ferrous Sulfate (IRON PO), Take by mouth., Disp: , Rfl:    fexofenadine (ALLEGRA) 180 MG tablet, TAKE 1 TABLET BY MOUTH EVERY DAY, Disp: 30 tablet, Rfl: 2   hydroxychloroquine (PLAQUENIL) 200 MG tablet, Take 2 tablets (400 mg total) by mouth daily., Disp: 180 tablet, Rfl: 1   Naproxen Sodium (ALEVE PO), Take by mouth as needed., Disp: , Rfl:   EXAM:  VITALS per patient if applicable:  RR between 12-20 bpm  GENERAL: alert, oriented, appears well and in no acute distress  HEENT: atraumatic, conjunctiva clear, no obvious abnormalities on inspection of  external nose and ears  NECK: normal movements of the head and neck  LUNGS: on inspection no signs of respiratory distress, breathing rate appears normal, no obvious gross SOB, gasping or wheezing  CV: no obvious cyanosis  MS: moves all visible extremities without noticeable abnormality  PSYCH/NEURO: pleasant and cooperative, no obvious depression or anxiety, speech and thought processing grossly intact  ASSESSMENT AND PLAN:  Discussed the following assessment and plan:  COVID-19 virus infection - Plan: molnupiravir EUA (LAGEVRIO) 200 mg CAPS capsule  Tested positive for COVID on 05/22/22  with symptoms starting that day.  Discussed r/b/a of antiviral med.  Start molnupiravir.  Continue supportive care with OTC meds.  Given strict precautions.  F/u prn    I discussed the assessment and treatment plan with the patient. The patient was provided an opportunity to ask questions and all were answered. The patient agreed with the plan and demonstrated an understanding of the instructions.   The patient was advised to call back or seek an in-person evaluation if the symptoms worsen or if the condition fails to improve as anticipated.  Billie Ruddy, MD

## 2022-06-02 ENCOUNTER — Emergency Department (HOSPITAL_BASED_OUTPATIENT_CLINIC_OR_DEPARTMENT_OTHER)
Admission: EM | Admit: 2022-06-02 | Discharge: 2022-06-02 | Disposition: A | Discharge status: Home / Self Care | Payer: Managed Care, Other (non HMO) | Attending: Emergency Medicine | Admitting: Emergency Medicine

## 2022-06-02 ENCOUNTER — Emergency Department (HOSPITAL_BASED_OUTPATIENT_CLINIC_OR_DEPARTMENT_OTHER): Payer: Managed Care, Other (non HMO)

## 2022-06-02 ENCOUNTER — Other Ambulatory Visit: Payer: Self-pay

## 2022-06-02 DIAGNOSIS — Z8616 Personal history of COVID-19: Secondary | ICD-10-CM | POA: Diagnosis not present

## 2022-06-02 DIAGNOSIS — Z7902 Long term (current) use of antithrombotics/antiplatelets: Secondary | ICD-10-CM | POA: Diagnosis not present

## 2022-06-02 DIAGNOSIS — R519 Headache, unspecified: Secondary | ICD-10-CM

## 2022-06-02 DIAGNOSIS — R42 Dizziness and giddiness: Secondary | ICD-10-CM | POA: Diagnosis not present

## 2022-06-02 DIAGNOSIS — Z79899 Other long term (current) drug therapy: Secondary | ICD-10-CM | POA: Insufficient documentation

## 2022-06-02 DIAGNOSIS — J320 Chronic maxillary sinusitis: Secondary | ICD-10-CM | POA: Diagnosis not present

## 2022-06-02 DIAGNOSIS — R8289 Other abnormal findings on cytological and histological examination of urine: Secondary | ICD-10-CM | POA: Diagnosis not present

## 2022-06-02 LAB — PREGNANCY, URINE: Preg Test, Ur: NEGATIVE

## 2022-06-02 LAB — URINALYSIS, ROUTINE W REFLEX MICROSCOPIC
Bilirubin Urine: NEGATIVE
Glucose, UA: NEGATIVE mg/dL
Ketones, ur: NEGATIVE mg/dL
Leukocytes,Ua: NEGATIVE
Nitrite: NEGATIVE
Protein, ur: NEGATIVE mg/dL
Specific Gravity, Urine: >=1.03 (ref 1.005–1.030)
pH: 6 (ref 5.0–8.0)

## 2022-06-02 LAB — BASIC METABOLIC PANEL
Anion gap: 7 (ref 5–15)
BUN: 18 mg/dL (ref 6–20)
CO2: 25 mmol/L (ref 22–32)
Calcium: 8.9 mg/dL (ref 8.9–10.3)
Chloride: 105 mmol/L (ref 98–111)
Creatinine, Ser: 0.96 mg/dL (ref 0.44–1.00)
GFR, Estimated: >60 mL/min (ref >60)
Glucose, Bld: 87 mg/dL (ref 70–99)
Potassium: 3.7 mmol/L (ref 3.5–5.1)
Sodium: 137 mmol/L (ref 135–145)

## 2022-06-02 LAB — URINALYSIS, MICROSCOPIC (REFLEX): WBC, UA: NONE SEEN WBC/hpf (ref 0–5)

## 2022-06-02 LAB — CBC
HCT: 34.8 % — ABNORMAL LOW (ref 36.0–46.0)
Hemoglobin: 11.2 g/dL — ABNORMAL LOW (ref 12.0–15.0)
MCH: 28.6 pg (ref 26.0–34.0)
MCHC: 32.2 g/dL (ref 30.0–36.0)
MCV: 88.8 fL (ref 80.0–100.0)
Platelets: 447 10*3/uL — ABNORMAL HIGH (ref 150–400)
RBC: 3.92 MIL/uL (ref 3.87–5.11)
RDW: 13.5 % (ref 11.5–15.5)
WBC: 8.7 10*3/uL (ref 4.0–10.5)
nRBC: 0 % (ref 0.0–0.2)

## 2022-06-02 LAB — CBG MONITORING, ED
Glucose-Capillary: 62 mg/dL — ABNORMAL LOW (ref 70–99)
Glucose-Capillary: 85 mg/dL (ref 70–99)

## 2022-06-02 MED ORDER — DOXYCYCLINE HYCLATE 100 MG PO CAPS: 100.0000 mg | ORAL_CAPSULE | Freq: Two times a day (BID) | ORAL | 0 refills | Status: DC

## 2022-06-02 NOTE — Discharge Instructions (Signed)
You were evaluated today for an episode of disequilibrium.  Your CT scan was reassuring for no signs of any acute findings at this time.  It does show signs of underlying sinus disease.  I have prescribed a new course of antibiotics to continue to treat the sinus infection which were previously treated for with Augmentin.  Please complete this course of antibiotics and follow-up with your primary care provider.  Please also keep your follow-up appointment with ear nose and throat next month.  If you develop any life-threatening symptoms please return to the emergency department

## 2022-06-02 NOTE — ED Notes (Signed)
Pt ambulatory to the restroom to provide a urine sample.

## 2022-06-02 NOTE — ED Notes (Signed)
128 ml Orange Juice  RN requested

## 2022-06-02 NOTE — ED Triage Notes (Signed)
Pt reports dizziness and occasional headaches for about a week. Worsening the last 2 days. Nausea but no vomiting.  States she has had vertigo in the past.

## 2022-06-02 NOTE — ED Provider Notes (Signed)
[place] [facility] Provider Note   CSN: [id number] Arrival date & time: 06/02/22  1609     History  Chief Complaint  Patient presents with   Dizziness   Headache    Deanna [name] is a 62 y.o. female.  Patient presents the emergency department complaining of sinus pressure with 1 episode yesterday of disequilibrium.  Patient states that earlier this month she was diagnosed with sinusitis and was prescribed Augmentin.  While being treated with antibiotics she tested positive for COVID.  She started molnupiravir on January 10.  She states that yesterday she continued to have some sinus pressure with occasional rhinorrhea.  She states that 1 time upon standing she felt slightly off balance.  She denies dizziness or vertiginous symptoms at the time.  She does have a history of vertigo but states this was different.  Patient also endorses headaches that started with COVID.  Currently patient is not complaining of a headache or disequilibrium.  She has a follow-up appointment with ENT scheduled for early February.  Past medical history significant for thrombotic thrombocytopenic purpura, hyperlipidemia, allergies, positive ANA, GERD  HPI     Home Medications Prior to Admission medications   Medication Sig Start Date End Date Taking? Authorizing Provider  doxycycline (VIBRAMYCIN) 100 MG capsule Take 1 capsule (100 mg total) by mouth 2 (two) times daily. 06/02/22  Yes Dorothyann Peng, PA-C  benzonatate (TESSALON) 100 MG capsule Take 1 capsule (100 mg total) by mouth 2 (two) times daily as needed for cough. 08/03/21   Billie Ruddy, MD  Cholecalciferol 100 MCG (4000 UT) CAPS Take by mouth daily.    [provider]  clopidogrel (PLAVIX) 75 MG tablet TAKE 1 TABLET BY MOUTH EVERY DAY 05/17/22   Cameron Sprang, MD  cyclobenzaprine (FLEXERIL) 10 MG tablet Take 1 tablet (10 mg total) by mouth at bedtime as needed for muscle spasms. 01/25/22    Rice, Resa Miner, MD  Ferrous Sulfate (IRON PO) Take by mouth.    [provider]  fexofenadine (ALLEGRA) 180 MG tablet TAKE 1 TABLET BY MOUTH EVERY DAY 03/21/21   Billie Ruddy, MD  hydroxychloroquine (PLAQUENIL) 200 MG tablet Take 2 tablets (400 mg total) by mouth daily. 04/26/22   Rice, Resa Miner, MD  Naproxen Sodium (ALEVE PO) Take by mouth as needed.    [provider]      Allergies    Eggs or egg-derived products and Lactose intolerance (gi)    Review of Systems   Review of Systems  Constitutional:  Negative for fever.  HENT:  Positive for congestion, rhinorrhea and sinus pressure. Negative for facial swelling.   Respiratory:  Negative for shortness of breath.   Cardiovascular:  Negative for chest pain.  Gastrointestinal:  Negative for abdominal pain, nausea and vomiting.  Genitourinary:  Negative for dysuria.  Neurological:  Positive for headaches (Frequent since COVID diagnosis, currently asymptomatic). Negative for syncope and speech difficulty.       Disequilibrium, now resolved    Physical Exam Updated Vital Signs BP (!) 148/88 (BP Location: Right Arm)   Pulse 66   Temp 98.2 F (36.8 C) (Oral)   Resp 16   SpO2 97%  Physical Exam Vitals and nursing note reviewed.  Constitutional:      General: She is not in acute distress.    Appearance: She is well-developed.  HENT:     Head: Normocephalic and atraumatic.     Nose:  Right Sinus: Maxillary sinus tenderness present. No frontal sinus tenderness.     Left Sinus: Maxillary sinus tenderness present. No frontal sinus tenderness.  Eyes:     Conjunctiva/sclera: Conjunctivae normal.  Cardiovascular:     Rate and Rhythm: Normal rate and regular rhythm.     Heart sounds: No murmur heard. Pulmonary:     Effort: Pulmonary effort is normal. No respiratory distress.     Breath sounds: Normal breath sounds.  Abdominal:     Palpations: Abdomen is soft.     Tenderness: There is no abdominal  tenderness.  Musculoskeletal:        General: No swelling.     Cervical back: Neck supple.  Skin:    General: Skin is warm and dry.     Capillary Refill: Capillary refill takes less than 2 seconds.  Neurological:     Mental Status: She is alert.  Psychiatric:        Mood and Affect: Mood normal.     ED Results / Procedures / Treatments   Labs (all labs ordered are listed, but only abnormal results are displayed) Labs Reviewed  CBC - Abnormal; Notable for the following components:      Result Value   Hemoglobin 11.2 (*)    HCT 34.8 (*)    Platelets 447 (*)    All other components within normal limits  URINALYSIS, ROUTINE W REFLEX MICROSCOPIC - Abnormal; Notable for the following components:   Hgb urine dipstick MODERATE (*)    All other components within normal limits  URINALYSIS, MICROSCOPIC (REFLEX) - Abnormal; Notable for the following components:   Bacteria, UA RARE (*)    All other components within normal limits  CBG MONITORING, ED - Abnormal; Notable for the following components:   Glucose-Capillary 62 (*)    All other components within normal limits  BASIC METABOLIC PANEL  PREGNANCY, URINE    EKG None  Radiology CT HEAD WO CONTRAST  Result Date: 06/02/2022 CLINICAL DATA:  Worsening dizziness x1 week. EXAM: CT HEAD WITHOUT CONTRAST TECHNIQUE: Contiguous axial images were obtained from the base of the skull through the vertex without intravenous contrast. RADIATION DOSE REDUCTION: This exam was performed according to the departmental dose-optimization program which includes automated exposure control, adjustment of the mA and/or kV according to patient size and/or use of iterative reconstruction technique. COMPARISON:  Oct 10, 2020 FINDINGS: Brain: There is mild cerebral atrophy with widening of the extra-axial spaces and ventricular dilatation. There are areas of decreased attenuation within the white matter tracts of the supratentorial brain, consistent with  microvascular disease changes. Vascular: No hyperdense vessel or unexpected calcification. Skull: Normal. Negative for fracture or focal lesion. Sinuses/Orbits: There is mild right maxillary sinus and right ethmoid sinus mucosal thickening. Other: None. IMPRESSION: 1. No acute intracranial abnormality. 2. 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 3. Mild right maxillary sinus and right ethmoid sinus disease. Electronically Signed   By: [name] M.D.   On: 06/02/2022 17:19    Procedures Procedures    Medications Ordered in ED Medications - No data to display  ED Course/ Medical Decision Making/ A&P                             Medical Decision Making Amount and/or Complexity of Data Reviewed Labs: ordered. Radiology: ordered.   This patient presents to the ED for concern of headache and disequilibrium, this involves an extensive  number of treatment options, and is a complaint that carries with it a high risk of complications and morbidity.  The differential diagnosis includes vertigo, lightheadedness, migraines, CVA, and others   Co morbidities that complicate the patient evaluation  History recent COVID infection, recent sinus infections   Additional history obtained:   External records from outside source obtained and reviewed including primary care notes from earlier this month due to COVID-19 infection and sinus infection   Lab Tests:  I Ordered, and personally interpreted labs.  The pertinent results include: Moderate hemoglobin on UA, hemoglobin 11.2, unremarkable BMP   Imaging Studies ordered:  I ordered imaging studies including CT head without contrast I independently visualized and interpreted imaging which showed  1. No acute intracranial abnormality.  2. Mild cerebral atrophy and microvascular disease changes of the  supratentorial brain.  3. Mild right maxillary sinus and right ethmoid sinus disease.   I  agree with the radiologist interpretation   Cardiac Monitoring: / EKG:  The patient was maintained on a cardiac monitor.  I personally viewed and interpreted the cardiac monitored which showed an underlying rhythm of: Normal sinus rhythm    Test / Admission - Considered:  At this time the patient is asymptomatic.  Her neuroexam was grossly unremarkable.  She had no nystagmus.  She is able to ambulate without difficulty.  Her headaches have subsided.  I feel that the headaches are likely due to the underlying COVID infection she has been dealing with.  Unclear cause of her disequilibrium.  CT scan was grossly unremarkable.  It did show some sinus disease on the right side.  Plan to discharge patient home with prescription for continued antibiotics for possible bacterial sinusitis/failure of treatment.  Patient does have moderate hemoglobin on UA but this appears to be consistent with all previous UAs over the past year.  Patient has follow-up with ENT scheduled for next month.  I believe it is reasonable to keep this appointment at this time and discharged home.  Return precautions provided including altered level of consciousness, shortness of breath, severe headache.        Final Clinical Impression(s) / ED Diagnoses Final diagnoses:  Dizziness  Maxillary sinusitis, unspecified chronicity  Acute nonintractable headache, unspecified headache type    Rx / DC Orders ED Discharge Orders          Ordered    doxycycline (VIBRAMYCIN) 100 MG capsule  2 times daily        06/02/22 1813              Dorothyann Peng, PA-C 06/02/22 1814    Jeanell Sparrow, DO 06/03/22 2144

## 2022-06-02 NOTE — ED Notes (Signed)
RN notified for CBG 62

## 2022-06-30 ENCOUNTER — Emergency Department (HOSPITAL_BASED_OUTPATIENT_CLINIC_OR_DEPARTMENT_OTHER)
Admission: EM | Admit: 2022-06-30 | Discharge: 2022-06-30 | Disposition: A | Discharge status: Home / Self Care | Payer: Managed Care, Other (non HMO) | Attending: Emergency Medicine | Admitting: Emergency Medicine

## 2022-06-30 ENCOUNTER — Emergency Department (HOSPITAL_BASED_OUTPATIENT_CLINIC_OR_DEPARTMENT_OTHER): Payer: Managed Care, Other (non HMO)

## 2022-06-30 ENCOUNTER — Encounter (HOSPITAL_BASED_OUTPATIENT_CLINIC_OR_DEPARTMENT_OTHER): Payer: Self-pay

## 2022-06-30 ENCOUNTER — Other Ambulatory Visit: Payer: Self-pay

## 2022-06-30 DIAGNOSIS — M25511 Pain in right shoulder: Secondary | ICD-10-CM | POA: Diagnosis present

## 2022-06-30 DIAGNOSIS — Z7902 Long term (current) use of antithrombotics/antiplatelets: Secondary | ICD-10-CM | POA: Insufficient documentation

## 2022-06-30 HISTORY — DX: Systemic lupus erythematosus, unspecified: M32.9

## 2022-06-30 HISTORY — DX: Reserved for concepts with insufficient information to code with codable children: IMO0002

## 2022-06-30 MED ORDER — CYCLOBENZAPRINE HCL 10 MG PO TABS: 10.0000 mg | ORAL_TABLET | Freq: Every day | ORAL | 0 refills | Status: AC

## 2022-06-30 MED ORDER — HYDROCODONE-ACETAMINOPHEN 5-325 MG PO TABS: 1.0000 | ORAL_TABLET | Freq: Four times a day (QID) | ORAL | 0 refills | Status: DC | PRN

## 2022-06-30 NOTE — ED Provider Notes (Signed)
[facility] [facility] Provider Note   CSN: [id number] Arrival date & time: 06/30/22  1558     History Lupus Chief Complaint  Patient presents with   Shoulder Pain    Deanna [name] is a 62 y.o. female.  62 year old female with a past medical history of lupus presents to the ED with a chief complaint of right shoulder pain which began 3 days ago.  Patient reports on Saturday she was cleaning at her home, she reports she was mopping, dusting and nothing out of her normal.  She reports the pain is exacerbated with any right shoulder overhead extension.  She has tried heat, cream, over-the-counter medication without any improvement in symptoms.  Alleviated with rest and has been holding it still against her arm.  She denies any other symptoms.  No history of fever, no history of IV drug use, no falls.  Not anticoagulated. In addition,she reports feeling bumps under her tongue, these popped up yesterday, they are painful when she is eating.  No alleviating factors.  The history is provided by the patient.  Shoulder Pain Associated symptoms: no fever        Home Medications Prior to Admission medications   Medication Sig Start Date End Date Taking? Authorizing Provider  cyclobenzaprine (FLEXERIL) 10 MG tablet Take 1 tablet (10 mg total) by mouth at bedtime for 7 days. 06/30/22 07/07/22 Yes Diavion Labrador, Beverley Fiedler, PA-C  HYDROcodone-acetaminophen (NORCO/VICODIN) 5-325 MG tablet Take 1 tablet by mouth every 6 (six) hours as needed for up to 6 doses. 06/30/22  Yes [place], Adam, DO  benzonatate (TESSALON) 100 MG capsule Take 1 capsule (100 mg total) by mouth 2 (two) times daily as needed for cough. 08/03/21   Billie Ruddy, MD  Cholecalciferol 100 MCG (4000 UT) CAPS Take by mouth daily.    [provider]  clopidogrel (PLAVIX) 75 MG tablet TAKE 1 TABLET BY MOUTH EVERY DAY 05/17/22   Cameron Sprang, MD  doxycycline (VIBRAMYCIN) 100 MG capsule Take 1  capsule (100 mg total) by mouth 2 (two) times daily. 06/02/22   Dorothyann Peng, PA-C  Ferrous Sulfate (IRON PO) Take by mouth.    [provider]  fexofenadine (ALLEGRA) 180 MG tablet TAKE 1 TABLET BY MOUTH EVERY DAY 03/21/21   Billie Ruddy, MD  hydroxychloroquine (PLAQUENIL) 200 MG tablet Take 2 tablets (400 mg total) by mouth daily. 04/26/22   Rice, Resa Miner, MD  Naproxen Sodium (ALEVE PO) Take by mouth as needed.    [provider]      Allergies    Eggs or egg-derived products and Lactose intolerance (gi)    Review of Systems   Review of Systems  Constitutional:  Negative for fever.  Respiratory:  Negative for shortness of breath.   Cardiovascular:  Negative for chest pain.  Musculoskeletal:  Positive for arthralgias.    Physical Exam Updated Vital Signs BP (!) 161/92 (BP Location: Left Arm)   Pulse 66   Temp 98.3 F (36.8 C) (Oral)   Resp 17   Ht 5' 9"$  (1.753 m)   Wt 105.7 kg   SpO2 100%   BMI 34.41 kg/m  Physical Exam Vitals and nursing note reviewed.  Constitutional:      Appearance: Normal appearance.  HENT:     Head: Normocephalic and atraumatic.  Cardiovascular:     Rate and Rhythm: Normal rate.  Pulmonary:     Effort: Pulmonary effort is normal.  Abdominal:  General: Abdomen is flat.  Musculoskeletal:        General: Tenderness present.     Right shoulder: Bony tenderness present. No deformity, effusion or crepitus. Decreased range of motion. Normal strength. Normal pulse.     Cervical back: Normal range of motion and neck supple.     Comments: 2+ radial pulse of the right arm.  Limited range of motion due to pain.  Worsening pain with overhead extension of the right shoulder.  Good strength and good capillary refill.  Sensation is intact.  Skin:    General: Skin is warm and dry.  Neurological:     Mental Status: She is alert and oriented to person, place, and time.     ED Results / Procedures / Treatments   Labs (all  labs ordered are listed, but only abnormal results are displayed) Labs Reviewed - No data to display  EKG None  Radiology DG Shoulder Right  Result Date: 06/30/2022 CLINICAL DATA:  Right shoulder pain. EXAM: RIGHT SHOULDER - 2+ VIEW COMPARISON:  None Available. FINDINGS: There is no evidence of fracture or dislocation. There is no evidence of arthropathy or other focal bone abnormality. Soft tissues are unremarkable. IMPRESSION: Negative. Electronically Signed   By: Marijo Conception M.D.   On: 06/30/2022 16:52    Procedures Procedures    Medications Ordered in ED Medications - No data to display  ED Course/ Medical Decision Making/ A&P                             Medical Decision Making Amount and/or Complexity of Data Reviewed Radiology: ordered.  Risk Prescription drug management.  Presents to the ED with a chief complaint of right shoulder pain which began approximately 3 days.  She reports on Saturday she did cleaning of her home, with mopping, dusting, was cleaning for several hours.  She reports a sharp pain to the right shoulder exacerbated with any type of movement.  She is tried over-the-counter medication without any improvement in symptoms.  She did not have any falls, no prior history of IV drug use, no fevers.  Patient is neurovascularly intact, 2+ radial pulses, good capillary refill.  Limited range of motion of the right shoulder due to pain especially with right overhead extension.  Not have any chest pain, no shortness of breath.  EKG was obtained normal sinus rhythm.  No prior cardiac history.  She does take Plavix.  X-ray of the right shoulder did not show any acute findings.  Some suspicion for rotator cuff involvement.  She does not have any orthopedist, given a referral to our on-call physician.  We discussed RICE therapy, will go home and a short course of Norco along with muscle relaxers.  Patient is hemodynamically stable for discharge.  Portions of this  note were generated with [employer]. Dictation errors may occur despite best attempts at proofreading.   Final Clinical Impression(s) / ED Diagnoses Final diagnoses:  Acute pain of right shoulder    Rx / DC Orders ED Discharge Orders          Ordered    cyclobenzaprine (FLEXERIL) 10 MG tablet  Daily at bedtime        06/30/22 1742    HYDROcodone-acetaminophen (NORCO/VICODIN) 5-325 MG tablet  Every 6 hours PRN        06/30/22 1744              Janeece Fitting, PA-C  06/30/22 [place], Adam, DO 06/30/22 1908

## 2022-06-30 NOTE — ED Notes (Signed)
ED Provider at bedside. 

## 2022-06-30 NOTE — ED Triage Notes (Signed)
Pt to er, pt states that she is having some R shoulder pain, states that she cleaned the house on Saturday, states that she felt ok Sunday, but then Monday she started having some R shoulder pain, states that she tried heat and creme, but continues to have pain.  Pt states that it hurts to move her arm.

## 2022-06-30 NOTE — Discharge Instructions (Signed)
You were given a prescription for muscle relaxers, please take this medication as prescribed.  In addition you may continue taking the naproxen to help with your symptoms.  Please make sure you are taking this medication with food.  The phone number to an orthopedic specialist attached to your chart, please schedule an appointment if your symptoms do not improve within the week.

## 2022-09-06 ENCOUNTER — Other Ambulatory Visit (INDEPENDENT_AMBULATORY_CARE_PROVIDER_SITE_OTHER): Payer: Managed Care, Other (non HMO)

## 2022-09-06 ENCOUNTER — Encounter: Payer: Self-pay | Admitting: Neurology

## 2022-09-06 ENCOUNTER — Ambulatory Visit (INDEPENDENT_AMBULATORY_CARE_PROVIDER_SITE_OTHER): Payer: Managed Care, Other (non HMO) | Admitting: Neurology

## 2022-09-06 VITALS — BP 149/79 | HR 80 | Ht 69.0 in | Wt 226.0 lb

## 2022-09-06 DIAGNOSIS — R2681 Unsteadiness on feet: Secondary | ICD-10-CM | POA: Diagnosis not present

## 2022-09-06 DIAGNOSIS — R42 Dizziness and giddiness: Secondary | ICD-10-CM

## 2022-09-06 DIAGNOSIS — R519 Headache, unspecified: Secondary | ICD-10-CM

## 2022-09-06 MED ORDER — GABAPENTIN 100 MG PO CAPS: ORAL_CAPSULE | ORAL | 6 refills | Status: DC

## 2022-09-06 NOTE — Progress Notes (Signed)
NEUROLOGY FOLLOW UP OFFICE NOTE  Deanna [name] [id number] 1961-01-16  HISTORY OF PRESENT ILLNESS: I had the pleasure of seeing Deanna [name] in follow-up in the neurology clinic on 09/06/2022.  The patient was last seen in November 2022. She was initially seen in 2018 for episodes of vertigo. MRI brain in 2018 showed a faint punctate focus of reduced diffusion in the right frontal lobe deep white matter, difficult to see on the ADC map due to small size, this can be seen with acute ischemia or hyperacute demyelination. She was started on Plavix for secondary stroke prevention. Symptoms resolved then she called in June 2022 to report an increase in headaches, vertigo, left-sided partial numbness. Brain MRI no acute changes. She was also reporting left arm symptoms due to cervical radiculopathy, improved with physical therapy.   She presents today at the recommendation of her ENT specialist. She is congested and coughing in the office today and states these symptoms have been occurring intermittently since January 2024. She first saw her PCP early January reporting 2 weeks of nasal congestion, post-nasal drainage, hoarse voice, occasional mild headaches. She was treated with Augmentin. She then had Covid on 1/8 with a bad headache, chills, and cough. On 1/19, she had dizziness described as veering to the right (no spinning sensation or lightheadedness), frontal pressure, diagnosed with sinusitis. On 2/6, she had a sore throat. On 3/2, she had a sore throat, mouth sores, which lasted until 3/8 then cleared up. On 3/21, she was sick and unable to work with congestion, runny nose, coughing. No head pressure or veering to one side. It lasted for 3 days. On 4/6, she felt like she was in a fog all day with frontal pressure for 2 days. She took Aleve which helped with the pressure and helped her focus more. This current bout started 3 days ago, she was on the couch all day Saturday and felt a little  better on Sunday but noticed she was again going sideways when trying to walk. No spinning sensation. She reports mostly pressure in the frontal and maxillary regions. There is some nausea. She denies any blurred or double vision but sometimes feels like there is something on the left visual field that comes and goes but nothing is there. She has difficulty swallowing when sick. She was coughing a lot to the point her chest hurst. She blows her nose a lot, sometimes there is yellowish discharge, other times it is clear. She had seen ENT, last phone note states "discussed sinus CT results with her demonstrating normal sinuses except for a right maxillary sinus mucus retention cyst. She continues with headache and pressure. I recommended she follow-up with her neurologist."    History on Initial Assessment 07/20/2016: This is a pleasant 62 yo RH woman with a history of hyperlipidemia who reported a 3-year history of intermittent episodes of dizziness described as spinning, as well as feeling lightheaded with blurred vision lasting 20-30 seconds. Recently she started having different symptoms, around 2 weeks ago she started getting carsick, which is new for her. A week ago, her left hand was numb, "like it was not there," then 20-30 seconds later it started tingling. Face and leg were unaffected. She did not have any neck pain at that time, but 3 days later started having a headache with pressure over the frontal regions that she ascribed to her sinuses. She started having pretty significant intermittent nausea. She continued to have dizziness, last episode was a week ago. The  headaches have resolved. Blurred vision still comes and goes. She denies any diplopia, dysarthria/dysphagia, back pain, bowel/bladder dysfunction, negative Lhermitte sign. She denies any prior history of focal symptoms, no prior history of vision loss. She denies any head injuries. She was diagnosed with TTP when she was pregnant and told she  could not take aspirin. There is a family history of stroke.   She had an MRI brain with and without contrast last 07/18/2016 which I personally reviewed, there was a faint punctate focus of reduced diffusion in the right frontal lobe deep white matter, difficult to see on the ADC map due to small size, this can be seen with acute ischemia or hyperacute demyelination. There were at least 5 additional subcentimeter white matter, right parietal, medial left thalamus, and left occipital horn FLAIR hyperintensities. There was mild prominence of the ventricles and sulci for age. No abnormal enhancement seen. 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    PAST MEDICAL HISTORY: Past Medical History:  Diagnosis Date   Allergy    GERD (gastroesophageal reflux disease)    "does not need meds anymore"   Heart murmur    Hyperlipidemia    Lactose intolerance    Lupus    OSA (obstructive sleep apnea)    Positive ANA (antinuclear antibody)    TTP (thrombotic thrombocytopenic purpura)     MEDICATIONS: Current Outpatient Medications on File Prior to Visit  Medication Sig Dispense Refill   Cholecalciferol 100 MCG (4000 UT) CAPS Take by mouth daily.     clopidogrel (PLAVIX) 75 MG tablet TAKE 1 TABLET BY MOUTH EVERY DAY 90 tablet 1   Ferrous Sulfate (IRON PO) Take by mouth.     fexofenadine (ALLEGRA) 180 MG tablet TAKE 1 TABLET BY MOUTH EVERY DAY (Patient taking differently: as needed.) 30 tablet 2   hydroxychloroquine (PLAQUENIL) 200 MG tablet Take 2 tablets (400 mg total) by mouth daily. 180 tablet 1   Naproxen Sodium (ALEVE PO) Take by mouth as needed.     benzonatate (TESSALON) 100 MG capsule Take 1 capsule (100 mg total) by mouth 2 (two) times daily as needed for cough. (Patient not taking: Reported on 09/06/2022) 20 capsule 0   doxycycline (VIBRAMYCIN) 100 MG capsule Take 1 capsule (100 mg total) by mouth 2 (two) times daily. (Patient not taking: Reported on 09/06/2022)  20 capsule 0   HYDROcodone-acetaminophen (NORCO/VICODIN) 5-325 MG tablet Take 1 tablet by mouth every 6 (six) hours as needed for up to 6 doses. (Patient not taking: Reported on 09/06/2022) 6 tablet 0   No current facility-administered medications on file prior to visit.    ALLERGIES: Allergies  Allergen Reactions   Egg-Derived Products Nausea And Vomiting   Lactose Intolerance (Gi) Other (See Comments)    vomiting    FAMILY HISTORY: Family History  Problem Relation Age of Onset   Arthritis Mother    Hyperlipidemia Mother    Stroke Mother    Hypertension Mother    Hypertension Father    Lupus Maternal Aunt    Breast cancer Daughter    Lupus Son    Colon cancer Neg Hx     SOCIAL HISTORY: Social History   Socioeconomic History   Marital status: Married    Spouse name: Not on file   Number of children: Not on file   Years of education: Not on file   Highest education level: Not on file  Occupational History   Not on file  Tobacco Use  Smoking status: Never   Smokeless tobacco: Never  Vaping Use   Vaping Use: Never used  Substance and Sexual Activity   Alcohol use: No   Drug use: No   Sexual activity: Not on file  Other Topics Concern   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 Situation: lives 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, husband and mother      Spiritual Beliefs: Christian      Lifestyle: walks a little a few days per week, diet is ok      Right Handed      Drinks Caffeine         Social Determinants of Health   Financial Resource Strain: Not on file  Food Insecurity: Not on file  Transportation Needs: Not on file  Physical Activity: Not on file  Stress: Not on file  Social Connections: Not on file  Intimate Partner Violence: Not on file     PHYSICAL EXAM: Vitals:   09/06/22 1402  BP: (!) 149/79  Pulse: 80  SpO2: 98%   General: No acute distress. Patient is coughing with nasal  congestion Head:  Normocephalic/atraumatic. +some tenderness to palpation over the maxillary and frontal sinuses Skin/Extremities: No rash, no edema Neurological Exam: alert and awake. No aphasia or dysarthria. Fund of knowledge is appropriate.  Attention and concentration are normal.   Cranial nerves: Pupils equal, round. Extraocular movements intact with no nystagmus. Visual fields full.  No facial asymmetry.  Motor: Bulk and tone normal, muscle strength 5/5 throughout with no pronator drift.  Reflexes +1 throughout. Finger to nose testing intact.  Gait narrow-based and steady, able to tandem walk adequately.  Romberg negative.   IMPRESSION: This is a pleasant 62 yo RH woman with a history of hyperlipidemia, who presented initially with a 3-year history of intermittent dizziness suggestive of vertigo that had worsened with nausea, headaches, and a transient episode of left hand numbness and tingling. MRI brain in 2018 had shown a faint punctate focus of reduced diffusion in the right frontal lobe deep white matter, difficult to see on the ADC map due to small size, this can be seen with acute ischemia or hyperacute demyelination. Repeat MRI did not show any progression or new changes. She is on Plavix for secondary stroke prevention. She presents today reporting a 52-month history of recurrent nasal congestion/runny nose with associated pressure over the frontal regions, sometimes she would veer to the right. Her neurological exam is normal. Check CBC with differential, ESR. We discussed doing a brain MRI without contrast to assess for underlying structural abnormality, however the nasal congestion/cough would not be neurological in nature. We can do symptomatic treatment of headaches with gabapentin, side effects discussed, start  qhs, can uptitrate as tolerated. Follow-up in 4 months, call for any changes.    Thank you for allowing me to participate in her care.  Please do not hesitate to call for  any questions or concerns.    Patrcia Dolly, M.D.   CC: Dr. Salomon Fick

## 2022-09-06 NOTE — Patient Instructions (Addendum)
Good to see you.  Bloodwork for CBC with differential, ESR  Schedule MRI brain without contrast  3. Start Gabapentin  every night for symptomatic treatment of headaches. If no improvement, we can increase to  every night  4. Follow-up in 4 months, call for any changes

## 2022-09-07 LAB — CBC WITH DIFFERENTIAL/PLATELET
Absolute Monocytes: 1132 cells/uL — ABNORMAL HIGH (ref 200–950)
Basophils Absolute: 133 cells/uL (ref 0–200)
Basophils Relative: 1.2 %
Eosinophils Absolute: 266 cells/uL (ref 15–500)
Eosinophils Relative: 2.4 %
HCT: 35.6 % (ref 35.0–45.0)
Hemoglobin: 12 g/dL (ref 11.7–15.5)
Lymphs Abs: 2042 cells/uL (ref 850–3900)
MCH: 28.5 pg (ref 27.0–33.0)
MCHC: 33.7 g/dL (ref 32.0–36.0)
MCV: 84.6 fL (ref 80.0–100.0)
MPV: 9.8 fL (ref 7.5–12.5)
Monocytes Relative: 10.2 %
Neutro Abs: 7526 cells/uL (ref 1500–7800)
Neutrophils Relative %: 67.8 %
Platelets: 462 10*3/uL — ABNORMAL HIGH (ref 140–400)
RBC: 4.21 10*6/uL (ref 3.80–5.10)
RDW: 13.6 % (ref 11.0–15.0)
Total Lymphocyte: 18.4 %
WBC: 11.1 10*3/uL — ABNORMAL HIGH (ref 3.8–10.8)

## 2022-09-07 LAB — SEDIMENTATION RATE: Sed Rate: 34 mm/h — ABNORMAL HIGH (ref 0–30)

## 2022-09-12 ENCOUNTER — Telehealth: Payer: Self-pay

## 2022-09-12 NOTE — Telephone Encounter (Signed)
Pt called an informed that bloodwork did not show any significant inflammation. Some of her counts are slightly higher than normal, recommend f/u with PCP on these

## 2022-09-12 NOTE — Telephone Encounter (Signed)
-----   Message from Van Clines, MD sent at 09/12/2022 12:28 PM EDT ----- Pls let her know bloodwork did not show any significant inflammation. Some of her counts are slightly higher than normal, recommend f/u with PCP on these. Thanks

## 2022-09-21 ENCOUNTER — Telehealth: Payer: Self-pay | Admitting: Anesthesiology

## 2022-09-21 NOTE — Telephone Encounter (Signed)
Pt called stating she would like for Dr Karel Jarvis to Rx something for her to calm her nerves before her MRI appointment states she is claustrophobic. Pharmacy is [facility] on [address] and [address].

## 2022-09-22 ENCOUNTER — Encounter: Payer: Self-pay | Admitting: Neurology

## 2022-09-22 MED ORDER — DIAZEPAM 5 MG PO TABS: ORAL_TABLET | ORAL | 0 refills | Status: DC

## 2022-09-22 NOTE — Telephone Encounter (Signed)
Rx sent, thanks 

## 2022-09-23 ENCOUNTER — Encounter (HOSPITAL_BASED_OUTPATIENT_CLINIC_OR_DEPARTMENT_OTHER): Payer: Self-pay | Admitting: Emergency Medicine

## 2022-09-23 ENCOUNTER — Emergency Department (HOSPITAL_BASED_OUTPATIENT_CLINIC_OR_DEPARTMENT_OTHER)
Admission: EM | Admit: 2022-09-23 | Discharge: 2022-09-24 | Disposition: A | Discharge status: Home / Self Care | Payer: Managed Care, Other (non HMO) | Attending: Emergency Medicine | Admitting: Emergency Medicine

## 2022-09-23 ENCOUNTER — Emergency Department (HOSPITAL_BASED_OUTPATIENT_CLINIC_OR_DEPARTMENT_OTHER): Payer: Managed Care, Other (non HMO)

## 2022-09-23 ENCOUNTER — Other Ambulatory Visit: Payer: Self-pay

## 2022-09-23 DIAGNOSIS — R0781 Pleurodynia: Secondary | ICD-10-CM | POA: Insufficient documentation

## 2022-09-23 DIAGNOSIS — R079 Chest pain, unspecified: Secondary | ICD-10-CM | POA: Diagnosis present

## 2022-09-23 DIAGNOSIS — R0602 Shortness of breath: Secondary | ICD-10-CM | POA: Diagnosis not present

## 2022-09-23 LAB — CBC WITH DIFFERENTIAL/PLATELET
Abs Immature Granulocytes: 0.02 10*3/uL (ref 0.00–0.07)
Basophils Absolute: 0.1 10*3/uL (ref 0.0–0.1)
Basophils Relative: 1 %
Eosinophils Absolute: 0.2 10*3/uL (ref 0.0–0.5)
Eosinophils Relative: 2 %
HCT: 36.3 % (ref 36.0–46.0)
Hemoglobin: 11.8 g/dL — ABNORMAL LOW (ref 12.0–15.0)
Immature Granulocytes: 0 %
Lymphocytes Relative: 26 %
Lymphs Abs: 2.4 10*3/uL (ref 0.7–4.0)
MCH: 28.9 pg (ref 26.0–34.0)
MCHC: 32.5 g/dL (ref 30.0–36.0)
MCV: 88.8 fL (ref 80.0–100.0)
Monocytes Absolute: 0.6 10*3/uL (ref 0.1–1.0)
Monocytes Relative: 7 %
Neutro Abs: 6 10*3/uL (ref 1.7–7.7)
Neutrophils Relative %: 64 %
Platelets: 404 10*3/uL — ABNORMAL HIGH (ref 150–400)
RBC: 4.09 MIL/uL (ref 3.87–5.11)
RDW: 14.2 % (ref 11.5–15.5)
WBC: 9.3 10*3/uL (ref 4.0–10.5)
nRBC: 0 % (ref 0.0–0.2)

## 2022-09-23 LAB — BASIC METABOLIC PANEL
Anion gap: 10 (ref 5–15)
BUN: 11 mg/dL (ref 6–20)
CO2: 26 mmol/L (ref 22–32)
Calcium: 8.8 mg/dL — ABNORMAL LOW (ref 8.9–10.3)
Chloride: 102 mmol/L (ref 98–111)
Creatinine, Ser: 0.77 mg/dL (ref 0.44–1.00)
GFR, Estimated: >60 mL/min (ref >60)
Glucose, Bld: 133 mg/dL — ABNORMAL HIGH (ref 70–99)
Potassium: 3.2 mmol/L — ABNORMAL LOW (ref 3.5–5.1)
Sodium: 138 mmol/L (ref 135–145)

## 2022-09-23 LAB — TROPONIN I (HIGH SENSITIVITY): Troponin I (High Sensitivity): 4 ng/L (ref <18)

## 2022-09-23 MED ORDER — ALUM & MAG HYDROXIDE-SIMETH 200-200-20 MG/5ML PO SUSP
15.0000 mL | Freq: Once | ORAL | Status: AC
  Administered 2022-09-24: 15 mL via ORAL
  Filled 2022-09-23: qty 30

## 2022-09-23 MED ORDER — SODIUM CHLORIDE 0.9 % IV BOLUS
500.0000 mL | Freq: Once | INTRAVENOUS | Status: AC
  Administered 2022-09-24: 500 mL via INTRAVENOUS

## 2022-09-23 NOTE — ED Triage Notes (Signed)
Pt reports LT CP since Tues that is sharp and increases with a deep breath;  also c/o dizziness and nausea since Tues

## 2022-09-23 NOTE — ED Notes (Signed)
Patient is short of breath just with walking from waiting room to exam room.

## 2022-09-23 NOTE — ED Notes (Signed)
Lab notified to add-on HFP and lipase to previously collected blood  sample.

## 2022-09-23 NOTE — ED Provider Notes (Signed)
Emergency Department Provider Note   I have reviewed the triage vital signs and the nursing notes.   HISTORY  Chief Complaint Chest Pain   HPI Deanna [name] is a 62 y.o. female past history of GERD, hyperlipidemia, lupus presents to the emergency department with sharp chest pain worse with deep breathing.  Symptoms began suddenly Tuesday without clear provoking factor.  She describes some mild shortness of breath as well as nausea and lightheadedness.  No exertional pain.  No recent URI symptoms.  No fevers or chills.  No hemoptysis.  She is not anticoagulated and notes a recent diagnosis of lupus.   Past Medical History:  Diagnosis Date   Allergy    GERD (gastroesophageal reflux disease)    "does not need meds anymore"   Heart murmur    Hyperlipidemia    Lactose intolerance    Lupus (HCC)    OSA (obstructive sleep apnea)    Positive ANA (antinuclear antibody)    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HCC)     Review of Systems  Constitutional: No fever/chills Cardiovascular: Positive chest pain. Respiratory: Denies shortness of breath. Gastrointestinal: No abdominal pain. Positive nausea, no vomiting.  No diarrhea.  No constipation. Genitourinary: Negative for dysuria. Musculoskeletal: Negative for back pain. Skin: Negative for rash. Neurological: Negative for headaches.  ____________________________________________   PHYSICAL EXAM:  VITAL SIGNS: ED Triage Vitals  Enc Vitals Group     BP 09/23/22 1954 (!) 150/93     Pulse Rate 09/23/22 1954 71     Resp 09/23/22 1954 18     Temp 09/23/22 1954 98 F (36.7 C)     Temp Source 09/23/22 1954 Oral     SpO2 09/23/22 1954 100 %   Constitutional: Alert and oriented. Well appearing and in no acute distress. Eyes: Conjunctivae are normal.  Head: Atraumatic. Nose: No congestion/rhinnorhea. Mouth/Throat: Mucous membranes are moist.  Oropharynx non-erythematous. Neck: No stridor.  Cardiovascular: Normal  rate, regular rhythm. Good peripheral circulation. Grossly normal heart sounds.   Respiratory: Normal respiratory effort.  No retractions. Lungs CTAB. Gastrointestinal: Soft and nontender. No distention.  Musculoskeletal: No lower extremity tenderness nor edema. No gross deformities of extremities. Neurologic:  Normal speech and language. No gross focal neurologic deficits are appreciated.  Skin:  Skin is warm, dry and intact. No rash noted.  ____________________________________________   LABS (all labs ordered are listed, but only abnormal results are displayed)  Labs Reviewed  BASIC METABOLIC PANEL - Abnormal; Notable for the following components:      Result Value   Potassium 3.2 (*)    Glucose, Bld 133 (*)    Calcium 8.8 (*)    All other components within normal limits  CBC WITH DIFFERENTIAL/PLATELET - Abnormal; Notable for the following components:   Hemoglobin 11.8 (*)    Platelets 404 (*)    All other components within normal limits  HEPATIC FUNCTION PANEL  LIPASE, BLOOD  TROPONIN I (HIGH SENSITIVITY)  TROPONIN I (HIGH SENSITIVITY)   ____________________________________________  EKG   EKG Interpretation  Date/Time:  Saturday Sep 23 2022 19:56:23 EDT Ventricular Rate:  74 PR Interval:  188 QRS Duration: 95 QT Interval:  479 QTC Calculation: 532 R Axis:   9 Text Interpretation: Sinus rhythm Low voltage, precordial leads Left ventricular hypertrophy Prolonged QT interval Confirmed by Alona Bene [phone]) on 09/24/2022 5:28:20 AM        ____________________________________________  RADIOLOGY  CT Angio Chest PE W and/or Wo Contrast  Result Date: 09/24/2022 CLINICAL DATA:  Suspected pulmonary embolism. EXAM: CT ANGIOGRAPHY CHEST WITH CONTRAST TECHNIQUE: Multidetector CT imaging of the chest was performed using the standard protocol during bolus administration of intravenous contrast. Multiplanar CT image reconstructions and MIPs were obtained to evaluate the  vascular anatomy. RADIATION DOSE REDUCTION: This exam was performed according to the departmental dose-optimization program which includes automated exposure control, adjustment of the mA and/or kV according to patient size and/or use of iterative reconstruction technique. CONTRAST:  75mL OMNIPAQUE IOHEXOL 350 MG/ML SOLN COMPARISON:  None Available. FINDINGS: Cardiovascular: The ascending thoracic aorta measures 4.1 cm in diameter. Satisfactory opacification of the pulmonary arteries to the segmental level. No evidence of pulmonary embolism. Normal heart size. No pericardial effusion. Mediastinum/Nodes: No enlarged mediastinal, hilar, or axillary lymph nodes. Thyroid gland, trachea, and esophagus demonstrate no significant findings. Lungs/Pleura: Mild atelectatic changes are seen within the right middle lobe and bilateral lung bases. There is no evidence of a pleural effusion or pneumothorax. 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 Musculoskeletal: No chest wall abnormality. No acute or significant osseous findings. Review of the MIP images confirms the above findings. IMPRESSION: 1. No evidence of pulmonary embolism. 2. Mild right middle lobe and bilateral lower lobe atelectasis. 3. Evidence of prior splenectomy. Electronically Signed   By: Aram Candela M.D.   On: 09/24/2022 01:46   DG Chest 2 View  Result Date: 09/23/2022 CLINICAL DATA:  Left-sided upper chest pain. EXAM: CHEST - 2 VIEW COMPARISON:  May 28, 2015 FINDINGS: The heart size and mediastinal contours are within normal limits. Low lung volumes are noted. Mild atelectasis is seen within the left lung base. There is no evidence of focal consolidation, pleural effusion or pneumothorax. Radiopaque surgical clips are seen within the posterior medial aspect of the left upper quadrant. The visualized skeletal structures are unremarkable. IMPRESSION: Low lung volumes with mild left basilar atelectasis. Electronically Signed   By:  Aram Candela M.D.   On: 09/23/2022 20:36    ____________________________________________   PROCEDURES  Procedure(s) performed:   Procedures  None  ____________________________________________   INITIAL IMPRESSION / ASSESSMENT AND PLAN / ED COURSE  Pertinent labs & imaging results that were available during my care of the patient were reviewed by me and considered in my medical decision making (see chart for details).   This patient is Presenting for Evaluation of CP, which does require a range of treatment options, and is a complaint that involves a high risk of morbidity and mortality.  The Differential Diagnoses includes but is not exclusive to acute coronary syndrome, aortic dissection, pulmonary embolism, cardiac tamponade, community-acquired pneumonia, pericarditis, musculoskeletal chest wall pain, etc.   Critical Interventions-    Medications  ondansetron (ZOFRAN) injection 4 mg (4 mg Intravenous Not Given 09/24/22 0209)  sodium chloride 0.9 % bolus 500 mL (0 mLs Intravenous Stopped 09/24/22 0210)  alum & mag hydroxide-simeth (MAALOX/MYLANTA) 200-200-20 MG/5ML suspension 15 mL (15 mLs Oral Given 09/24/22 0039)  iohexol (OMNIPAQUE) 350 MG/ML injection 75 mL (75 mLs Intravenous Contrast Given 09/24/22 0115)    Reassessment after intervention: Pain improved.    I did obtain Additional Historical Information from family at bedside.   I decided to review pertinent External Data, and in summary last ECHO from 2018.   Clinical Laboratory Tests Ordered, included troponin negative x 2.  LFTs and bilirubin normal.  No leukocytosis on CBC.  Radiologic Tests Ordered, included CXR and CTA PE. I independently interpreted the images and agree with radiology interpretation.   Cardiac Monitor Tracing  which shows NSR.    Social Determinants of Health Risk patient is a non-smoker.   Medical Decision Making: Summary:  Patient presents emergency department with pleuritic chest  pain which began suddenly.  Recently diagnosed with lupus which increases risk of VTE.  Vitals are within normal limits including heart rate and O2 level.  Moderate risk on [place]. Unable to risk stratify further with PERC. Plan for CTA PE and reassess.   Reevaluation with update and discussion with patient. CTA negative. Plan for NSAIDs for pleuritic pain and close PCP follow-up.   Considered admission but no acute findings on labs. ACS ruled out.   Patient's presentation is most consistent with acute presentation with potential threat to life or bodily function.   Disposition: discharge  ____________________________________________  FINAL CLINICAL IMPRESSION(S) / ED DIAGNOSES  Final diagnoses:  Pleuritic chest pain     NEW OUTPATIENT MEDICATIONS STARTED DURING THIS VISIT:  Discharge Medication List as of 09/24/2022  2:01 AM     START taking these medications   Details  meloxicam (MOBIC) 7.5 MG tablet Take 1 tablet (7.5 mg total) by mouth daily for 7 days., Starting Sun 09/24/2022, Until Sun 10/01/2022, Normal        Note:  This document was prepared using Dragon voice recognition software and may include unintentional dictation errors.  Alona Bene, MD, [facility]    Obediah Welles, Arlyss Repress, MD 09/24/22 [phone]

## 2022-09-24 ENCOUNTER — Encounter (HOSPITAL_BASED_OUTPATIENT_CLINIC_OR_DEPARTMENT_OTHER): Payer: Self-pay

## 2022-09-24 ENCOUNTER — Emergency Department (HOSPITAL_BASED_OUTPATIENT_CLINIC_OR_DEPARTMENT_OTHER): Payer: Managed Care, Other (non HMO)

## 2022-09-24 LAB — HEPATIC FUNCTION PANEL
ALT: 14 U/L (ref 0–44)
AST: 20 U/L (ref 15–41)
Albumin: 3.9 g/dL (ref 3.5–5.0)
Alkaline Phosphatase: 70 U/L (ref 38–126)
Bilirubin, Direct: 0.1 mg/dL (ref 0.0–0.2)
Indirect Bilirubin: 0.8 mg/dL (ref 0.3–0.9)
Total Bilirubin: 0.9 mg/dL (ref 0.3–1.2)
Total Protein: 8 g/dL (ref 6.5–8.1)

## 2022-09-24 LAB — TROPONIN I (HIGH SENSITIVITY): Troponin I (High Sensitivity): 4 ng/L (ref <18)

## 2022-09-24 LAB — LIPASE, BLOOD: Lipase: 31 U/L (ref 11–51)

## 2022-09-24 MED ORDER — IOHEXOL 350 MG/ML SOLN
75.0000 mL | Freq: Once | INTRAVENOUS | Status: AC | PRN
  Administered 2022-09-24: 75 mL via INTRAVENOUS

## 2022-09-24 MED ORDER — MELOXICAM 7.5 MG PO TABS: 7.5000 mg | ORAL_TABLET | Freq: Every day | ORAL | 0 refills | Status: AC

## 2022-09-24 MED ORDER — ONDANSETRON HCL 4 MG/2ML IJ SOLN
4.0000 mg | Freq: Once | INTRAMUSCULAR | Status: DC
  Filled 2022-09-24: qty 2

## 2022-09-24 NOTE — ED Notes (Signed)
Pt discharged to home, NAD noted at time of discharge 

## 2022-09-24 NOTE — Discharge Instructions (Signed)
You were seen in the emergency department today with chest discomfort.  Your CT scan and lab work are reassuring.  I am putting you on an anti-inflammatory medication that is similar to ibuprofen.  While taking meloxicam, you do not want to take additional ibuprofen or Aleve.  You may continue to take Tylenol.  Please follow close with your primary care doctor.

## 2022-09-25 ENCOUNTER — Other Ambulatory Visit: Payer: Managed Care, Other (non HMO)

## 2022-09-26 ENCOUNTER — Telehealth: Payer: Self-pay | Admitting: Anesthesiology

## 2022-09-26 NOTE — Telephone Encounter (Signed)
Pt called stating she was scheduled for an MRI yesterday, when she showed up to [place] [facility] the MRI was cancelled. States MRI needs a PA.

## 2022-09-27 ENCOUNTER — Ambulatory Visit (HOSPITAL_BASED_OUTPATIENT_CLINIC_OR_DEPARTMENT_OTHER): Payer: Managed Care, Other (non HMO) | Attending: Physician Assistant | Admitting: Physical Therapy

## 2022-09-27 ENCOUNTER — Encounter (HOSPITAL_BASED_OUTPATIENT_CLINIC_OR_DEPARTMENT_OTHER): Payer: Self-pay | Admitting: Physical Therapy

## 2022-09-27 ENCOUNTER — Other Ambulatory Visit: Payer: Self-pay

## 2022-09-27 DIAGNOSIS — M25511 Pain in right shoulder: Secondary | ICD-10-CM | POA: Diagnosis present

## 2022-09-27 NOTE — Therapy (Signed)
OUTPATIENT PHYSICAL THERAPY SHOULDER EVALUATION   Patient Name: Deanna [name] MRN: [id number] DOB:1961/01/18, 62 y.o., female Today's Date: 09/28/2022  END OF SESSION:  PT End of Session - 09/27/22 1608     Visit Number 1    Number of Visits 6    Date for PT Re-Evaluation 11/08/22    PT Start Time 1600    PT Stop Time 1643    PT Time Calculation (min) 43 min    Activity Tolerance Patient tolerated treatment well    Behavior During Therapy WFL for tasks assessed/performed             Past Medical History:  Diagnosis Date   Allergy    GERD (gastroesophageal reflux disease)    "does not need meds anymore"   Heart murmur    Hyperlipidemia    Lactose intolerance    Lupus (HCC)    OSA (obstructive sleep apnea)    Positive ANA (antinuclear antibody)    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HCC)    Past Surgical History:  Procedure Laterality Date   ABDOMINAL HYSTERECTOMY  2006   benign tumor, complete   KNEE ARTHROSCOPY Right    NASAL SEPTUM SURGERY     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   Patient Active Problem List   Diagnosis Date Noted   Pain in right leg 01/25/2022   Recurrent sinusitis 06/09/2021   Pedal edema 04/18/2021   High risk medication use 01/11/2021   Spontaneous bruising 10/19/2020   Undifferentiated connective tissue disease (HCC) 09/07/2020   Raynaud's phenomenon 09/07/2020   Onychomycosis 07/25/2019   Varicose veins of both legs with edema 07/25/2019   H. 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   Aortic root dilatation (HCC) 08/07/2016   Ear pain 07/22/2016   Hyperlipemia 12/02/2015   H/O splenectomy 12/02/2015   Hyperglycemia 01/27/2014   Obesity 01/27/2014   Vitamin D deficiency 09/11/2012    PCP: Dr Abbe Amsterdam MD    REFERRING PROVIDER: Dion Saucier PA  REFERRING DIAG:   THERAPY DIAG:  Acute pain of right shoulder  Rationale for Evaluation and Treatment: Rehabilitation  ONSET DATE: February 2023   SUBJECTIVE:                                                                                                                                                                                      SUBJECTIVE STATEMENT: Patient had an onset of acute right shoulder pain starting in  February. She did some cleaning the day before and the shoulder pain began the next day. She reports constant pain until about 3 weeks ago She had an injection and has had no pain since that point.    Hand dominance: Right  PERTINENT HISTORY:   PAIN:  Are you having pain? Yes: NPRS scale: 0/10 Pain location: right shoulder  Pain description: aching  Aggravating factors: use of the arm  Relieving factors: rest  PRECAUTIONS: None  WEIGHT BEARING RESTRICTIONS: No  FALLS:  Has patient fallen in last 6 months? No  LIVING ENVIRONMENT:   OCCUPATION: Works at the [employer]   PLOF: Independent  PATIENT GOALS:  NEXT MD VISIT:   OBJECTIVE:   DIAGNOSTIC FINDINGS:  X-ray(-)   PATIENT SURVEYS:  COGNITION: Overall cognitive status: Within functional limits for tasks assessed     SENSATION: WFL  POSTURE: Mild rounding of shoulders   UPPER EXTREMITY ROM:   Active ROM Right eval Left eval  Shoulder flexion Full Full  Shoulder extension    Shoulder abduction    Shoulder adduction    Shoulder internal rotation Mild tight feeling at end range Full  Shoulder external rotation Full Full  Elbow flexion    Elbow extension    Wrist flexion    Wrist extension    Wrist ulnar deviation    Wrist radial deviation    Wrist pronation    Wrist supination    (Blank rows = not tested)  Passive ROM Right eval Left eval  Shoulder flexion Full Full  Shoulder extension    Shoulder abduction     Shoulder adduction    Shoulder internal rotation Full Full  Shoulder external rotation Full Full  Elbow flexion    Elbow extension    Wrist flexion    Wrist extension    Wrist ulnar deviation    Wrist radial deviation    Wrist pronation    Wrist supination    (Blank rows = not tested)  UPPER EXTREMITY MMT:  MMT Right eval Left eval  Shoulder flexion    Shoulder extension    Shoulder abduction    Shoulder adduction    Shoulder internal rotation 5 5  Shoulder external rotation 4+ 4+  Middle trapezius    Lower trapezius    Elbow flexion    Elbow extension    Wrist flexion    Wrist extension    Wrist ulnar deviation    Wrist radial deviation    Wrist pronation    Wrist supination    Grip strength (lbs)    (Blank rows = not tested)  SHOULDER SPECIAL TESTS:  JOINT MOBILITY TESTING:    PALPATION:     TODAY'S TREATMENT:  DATE:  Access Code: [email or web address] URL: [email or web address] Date: 09/28/2022 Prepared by: Lorayne Bender  Exercises - Seated Upper Trapezius Stretch  - 1 x daily - 7 x weekly - 3 sets - 3 reps - 15sec  hold - Shoulder extension with resistance - Neutral  - 1 x daily - 7 x weekly - 3 sets - 10 reps - Scapular Retraction with Resistance  - 1 x daily - 7 x weekly - 3 sets - 10 reps - Shoulder External Rotation and Scapular Retraction with Resistance  - 1 x daily - 7 x weekly - 3 sets - 10 reps  PATIENT EDUCATION: Education details: HEP, symptom management  Person educated: Patient Education method: Explanation, Demonstration, Tactile cues, Verbal cues, and Handouts Education comprehension: verbalized understanding, returned demonstration, verbal cues required, tactile cues required, and needs further education  HOME EXERCISE PROGRAM:  Access Code: [email or web address] URL:  [email or web address] Date: 09/28/2022 Prepared by: Lorayne Bender  Exercises - Seated Upper Trapezius Stretch  - 1 x daily - 7 x weekly - 3 sets - 3 reps - 15sec  hold - Shoulder extension with resistance - Neutral  - 1 x daily - 7 x weekly - 3 sets - 10 reps - Scapular Retraction with Resistance  - 1 x daily - 7 x weekly - 3 sets - 10 reps - Shoulder External Rotation and Scapular Retraction with Resistance  - 1 x daily - 7 x weekly - 3 sets - 10 reps ASSESSMENT:  CLINICAL IMPRESSION: Patient is a 62 year old female who presented to physical therapy with an acute onset of right shoulder pain.  She had an injection 3 weeks ago.  Since that point the patient has had no pain.  She reports she has been working and performing ADLs without pain.  She had full range of motion, good strength, mild tenderness to palpation of her upper trap otherwise no tenderness to palpation in her shoulder.  She has a mild rounding of her shoulders.  The patient was given a base HEP for posture and posterior chain strengthening to prevent further exacerbation of her shoulder.  She was advised if she has any pain with the exercises to stop.  At this time the patient has no need for skilled therapy.  Will keep her case open for 4 to 6 weeks.  She was advised that she has an exacerbation of pain in that period of time to come back and we will assess further and modify her exercise program.  She was otherwise advised integrate some form posterior chain strengthening into her exercise routine 3-4 times a week. OBJECTIVE IMPAIRMENTS: decreased strength, impaired UE functional use, and pain.   ACTIVITY LIMITATIONS: carrying, lifting, and reach over head  PARTICIPATION LIMITATIONS: cleaning and occupation  PERSONAL FACTORS: 1-2 comorbidities: RA, Lupus  are also affecting patient's functional outcome.   REHAB POTENTIAL: Excellent  CLINICAL DECISION MAKING: Stable/uncomplicated  EVALUATION COMPLEXITY:  Low   GOALS: Goals reviewed with patient? Yes  SHORT TERM GOALS: Target date: 10/18/2022    Patient will be independent with baseline HEP Baseline: Goal status: Achieved  Further goals will be established if patient returns with pain  PLAN:  PT FREQUENCY: 1x/week  PT DURATION: 6 weeks  PLANNED INTERVENTIONS: Therapeutic exercises, Therapeutic activity, Neuromuscular re-education, Patient/Family education, Self Care, Joint mobilization, Aquatic Therapy, Dry Needling, Cryotherapy, Moist heat, Taping, Ultrasound, Ionotophoresis 4mg /ml Dexamethasone, and Manual therapy  PLAN FOR NEXT SESSION: If patient returns for follow-up assess symptoms and progress HEP as tolerated   Onalee Hua  Marquita Palms, PT 09/28/2022, 8:20 AM

## 2022-09-27 NOTE — Telephone Encounter (Signed)
Checkin with [facility] currently

## 2022-09-27 NOTE — Telephone Encounter (Signed)
Clinicals pending, for review, they will reach out to her

## 2022-09-27 NOTE — Telephone Encounter (Signed)
Patient advised and thanked me for calling  

## 2022-09-28 ENCOUNTER — Encounter (HOSPITAL_BASED_OUTPATIENT_CLINIC_OR_DEPARTMENT_OTHER): Payer: Self-pay | Admitting: Physical Therapy

## 2022-10-08 ENCOUNTER — Ambulatory Visit
Admission: RE | Admit: 2022-10-08 | Discharge: 2022-10-08 | Disposition: A | Discharge status: Home / Self Care | Payer: Managed Care, Other (non HMO) | Source: Ambulatory Visit | Attending: Neurology | Admitting: Neurology

## 2022-10-08 DIAGNOSIS — R42 Dizziness and giddiness: Secondary | ICD-10-CM

## 2022-10-08 DIAGNOSIS — R519 Headache, unspecified: Secondary | ICD-10-CM

## 2022-10-08 DIAGNOSIS — R2681 Unsteadiness on feet: Secondary | ICD-10-CM

## 2022-10-13 ENCOUNTER — Telehealth: Payer: Self-pay | Admitting: Neurology

## 2022-10-13 DIAGNOSIS — I6339 Cerebral infarction due to thrombosis of other cerebral artery: Secondary | ICD-10-CM

## 2022-10-13 NOTE — Telephone Encounter (Signed)
Patient wants to get the results of the MRI  please call after 3:00

## 2022-10-13 NOTE — Telephone Encounter (Signed)
Left message results are still pending at 1:55 10/13/2022

## 2022-10-13 NOTE — Telephone Encounter (Signed)
We do not have the results back at this time ,

## 2022-10-16 NOTE — Telephone Encounter (Signed)
Can you pls call [facility], MRI was done 10/08/22 and no formal report yet, thanks

## 2022-10-16 NOTE — Telephone Encounter (Signed)
[facility] called they are changing MRI to stat and they will get it read

## 2022-10-16 NOTE — Telephone Encounter (Signed)
[facility] called no answer

## 2022-10-16 NOTE — Telephone Encounter (Signed)
Pt called in to get her MRI results 

## 2022-10-18 NOTE — Telephone Encounter (Signed)
Pt called an informed that we have ordered bloodwork for lipid panel and HbA1c she was advised where to go

## 2022-10-18 NOTE — Telephone Encounter (Signed)
Spoke to patient about MRI brain showing punctate restricted diffusion high right frontal region, concerning for punctate infarct (but could also be artifact). 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 She reports that 2 Saturdays ago, she had problems with her left hand "folding in." This has not recurred. This week, she has had pain from her left shoulder down to her hand. Today she has not had any pain. The asleep feeling is not there. Face and leg are unaffected. MRI brain was ordered for headache, she was having a lot of cough/congestion. The cough/congestion and headache are better.   Discussed doing stroke workup. Heather, pls order bloodwork for lipid panel and HbA1c and let patient know where to go. Pls also order CTA head and neck with and without contrast (dx: recurrent stroke). Also echocardiogram. Patient instructed to take aspirin 81mg  with Plavix daily for 3 weeks, then Plavix daily. She was on Plavix with this current stroke. She also notes occasional palpitations and chest pain. If echo normal, would do 2-week holter. She knows to go to the ER for any sudden change in symptoms. Left arm symptoms have been chronic over the years, if persists, would do physical therapy again.

## 2022-10-18 NOTE — Telephone Encounter (Signed)
Returning [place] call please call her at [phone]

## 2022-10-19 ENCOUNTER — Other Ambulatory Visit (INDEPENDENT_AMBULATORY_CARE_PROVIDER_SITE_OTHER): Payer: Managed Care, Other (non HMO)

## 2022-10-19 DIAGNOSIS — I6339 Cerebral infarction due to thrombosis of other cerebral artery: Secondary | ICD-10-CM | POA: Diagnosis not present

## 2022-10-19 LAB — HEMOGLOBIN A1C: Hgb A1c MFr Bld: 5.7 % (ref 4.6–6.5)

## 2022-10-19 LAB — LIPID PANEL
Cholesterol: 166 mg/dL (ref 0–200)
HDL: 51.9 mg/dL (ref >39.00)
LDL Cholesterol: 98 mg/dL (ref 0–99)
NonHDL: 113.97
Total CHOL/HDL Ratio: 3
Triglycerides: 79 mg/dL (ref 0.0–149.0)
VLDL: 15.8 mg/dL (ref 0.0–40.0)

## 2022-10-23 ENCOUNTER — Telehealth: Payer: Self-pay | Admitting: Neurology

## 2022-10-23 DIAGNOSIS — I6339 Cerebral infarction due to thrombosis of other cerebral artery: Secondary | ICD-10-CM

## 2022-10-23 DIAGNOSIS — Z8673 Personal history of transient ischemic attack (TIA), and cerebral infarction without residual deficits: Secondary | ICD-10-CM

## 2022-10-23 NOTE — Telephone Encounter (Signed)
Pt wants to know if we have sent in her orders for the MRI with Contrast, she is at works os she said to please leave her a VM if she does not answer

## 2022-10-23 NOTE — Telephone Encounter (Signed)
Pt called informed that order for CTA head and neck was sent to [facility],

## 2022-10-25 ENCOUNTER — Encounter: Payer: Self-pay | Admitting: Internal Medicine

## 2022-10-25 ENCOUNTER — Ambulatory Visit: Payer: Managed Care, Other (non HMO) | Attending: Internal Medicine | Admitting: Internal Medicine

## 2022-10-25 VITALS — BP 128/84 | HR 67 | Resp 14 | Ht 69.0 in | Wt 225.0 lb

## 2022-10-25 DIAGNOSIS — M359 Systemic involvement of connective tissue, unspecified: Secondary | ICD-10-CM

## 2022-10-25 DIAGNOSIS — I7300 Raynaud's syndrome without gangrene: Secondary | ICD-10-CM | POA: Diagnosis not present

## 2022-10-25 DIAGNOSIS — I77810 Thoracic aortic ectasia: Secondary | ICD-10-CM

## 2022-10-25 DIAGNOSIS — R9089 Other abnormal findings on diagnostic imaging of central nervous system: Secondary | ICD-10-CM | POA: Diagnosis not present

## 2022-10-25 NOTE — Progress Notes (Signed)
Office Visit Note  Patient: Deanna [name]             Date of Birth: August 07, 1960           MRN: [id number]             PCP: [name], MD Referring: [name], MD Visit Date: 10/25/2022   Subjective:  Follow-up (Patient states she had a mini stroke. )   History of Present Illness: Deanna [name] is a 62 y.o. female here for follow up for lupus on hydroxychloroquine 400 mg daily.  Since last visit she has had a lot of medical events.  She was sick with upper respiratory infection symptoms for about 2 months and developed pleuritic chest pain imaging consistent with pleurisy as well.  Subsequently developed arm weakness and was found to have small stroke.  Did not have specific precipitating cause identified on workup and is doing well without significant residual afterwards.  She just had evaluation for some increase in right knee swelling a bit worse than usual.  Also having some tenderness in her hands around PIP joints without any visible changes.  Previous HPI 04/26/22 Deanna [name] is a 62 y.o. 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  Since her last visit her lateral hip pain is doing better.  She took a Flexeril for short time which was beneficial but noticed a lot of drowsiness or loopy sensation side effects so stopped taking this by now.  Biggest issue currently is her sleep.  Has a lot of fatigue and increase in stiffness associated with fragmented sleep.  She wakes up by 3 AM every morning and cannot fall back asleep or remaining asleep.  She is not taking naps during the day and is trying to follow a consistent sleep schedule without much improvement.  She has known OSA and reports consistent use of her CPAP.   Previous HPI 01/25/22 Deanna [name] is a 62 y.o. female here for follow up For lupus/RA on HCQ 400 mg daily.  Systemic symptoms are doing pretty well but is having increased trouble with  pain in her low back and right leg.  Pain going down the lateral side of the leg from the hip down to the knee.  She is not seeing any visible swelling or abnormal appearance in the affected area.  She is not having any falls or severe mobility limitation.     Previous HPI 10/27/2021 Deanna [name] is a 62 y.o. 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 She was doing fairly well off prednisone after last visit. She had a good vacation trip to [place] in March. However afterwards was sick with COVID without serious complications. 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 She has had 3 sinus infections so far this year. She saw ENT late last year due to recurrent sinusitis problems. During these illnesses she has an increase in hand and wrist pains. Most recently developed burning type of pain affecting along her hairline and low back area. This comes and goes no visible changes associated. She noticed some hair loss and thinning at multiple areas during this time. She has tried avoiding any chemical exposures to her hair and scalp.    Previous HPI 04/18/21 Deanna [name] is a 62 y.o. female here for follow up  for lupus on HCQ 400 mg PO daily and tapering off prednisone. She has had ongoing upper respiratory problems since our last visit with recurrent ear infection and upper respiratory infections most of the past 3 months. She has taken several rounds of antibiotics with clearance but quick return of symptoms. She was treated with high dose prednisone once after visiting to the emergency department. She has experienced bleeding and ulceration in the nose. She has also suffered spontaneous bruising in one digit for about 2 days at a time with spontaneous resolution. She had past ENT surgery for septum defect but has not had chronic problems until all this started again after our last  visit. She has noticed a small increased in lower extremity swelling with deeper sock band indentations, no discoloration or pain or significant weight change. She had her HCQ eye exam with [facility] okay to continue HCQ but they are planning to follow up in 2-4 weeks.   Previous HPI 01/11/21 Deanna [name] is a 62 y.o. female here for follow up for lupus after starting HCQ 400 mg PO daily and prednisone 5 mg PO daily.  Overall doing well on this but has continued the low-dose steroids.  She has not noticed problems with taking the medication.   09/07/20 Deanna [name] is a 62 y.o. female here for evaluation of positive ANA with fatigue and myalgias. Symptoms are persistent since last year with fatigue, pains in her hands and ankles and stiffness but she has had some joint pains going back for years. She typically notices swelling at the ankles by the end of the day. This is moderately painful but not debilitating. Her hand pain is worst with use during the day and she notices swelling when eating a lot of salt. 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 She does experience raynaud's symptoms with pallor in fingertips without any lesions ulcers or injuries. She denies problems with mouth and eye dryness, lymphadenopathy, chest pain, skin rashes, or photosensitive   [facility] Labs reviewed ANA 1:640 speckled CB-CAP EC4d 16 CB-CAP BC4d 90   Lupus manifestations Fatigue Arthralgia Raynaud's ?Spontaneous bruising Hx of TTP s/p splenectomy (primary vs secondary?)   Labs reviewed ANA pos Scl-70 1.3   Review of Systems  Constitutional:  Positive for fatigue.  HENT:  Negative for mouth sores and mouth dryness.   Eyes:  Positive for dryness.  Respiratory:  Negative for shortness of breath.   Cardiovascular:  Positive for palpitations. Negative for chest pain.  Gastrointestinal:  Negative for blood in stool,  constipation and diarrhea.  Endocrine: Negative for increased urination.  Genitourinary:  Negative for involuntary urination.  Musculoskeletal:  Positive for joint pain, gait problem, joint pain, joint swelling, myalgias, muscle weakness, morning stiffness, muscle tenderness and myalgias.  Skin:  Positive for sensitivity to sunlight. Negative for color change, rash and hair loss.  Allergic/Immunologic: Positive for susceptible to infections.  Neurological:  Positive for dizziness and headaches.  Hematological:  Negative for swollen glands.  Psychiatric/Behavioral:  Positive for sleep disturbance. Negative for depressed mood. The patient is not nervous/anxious.     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   Aortic root dilatation (HCC) 08/07/2016   Ear pain 07/22/2016   Hyperlipemia 12/02/2015   H/O splenectomy 12/02/2015   Hyperglycemia 01/27/2014   Obesity 01/27/2014   Vitamin D deficiency 09/11/2012    Past Medical History:  Diagnosis Date   Allergy    GERD (gastroesophageal reflux disease)    "does not need meds anymore"   Heart murmur    Hyperlipidemia    Lactose intolerance    Lupus (HCC)    Mini stroke    OSA (obstructive sleep apnea)    Positive ANA (antinuclear antibody)    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HCC)     Family History  Problem Relation Age of Onset    Arthritis Mother    Hyperlipidemia Mother    Stroke Mother    Hypertension Mother    Hypertension Father    Lupus Maternal Aunt    Breast cancer Daughter    Lupus Son    Colon cancer Neg Hx    Past Surgical History:  Procedure Laterality Date   ABDOMINAL HYSTERECTOMY  2006   benign tumor, complete   KNEE ARTHROSCOPY Right    NASAL SEPTUM SURGERY     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   Social History   Social History Narrative   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 Situation: lives 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, husband and mother      Spiritual Beliefs: Christian      Lifestyle: walks a little a few days per week, diet is ok      Right Handed      Drinks Caffeine         Immunization History  Administered Date(s) Administered   Meningococcal B, OMV 12/02/2015   PFIZER Comirnaty(Gray Top)Covid-19 Tri-Sucrose Vaccine 07/19/2019, 08/09/2019, 06/16/2020   PPD Test 01/21/2018   Pneumococcal Polysaccharide-23 10/14/2010, 10/21/2014     Objective: Vital Signs: BP 128/84 (BP Location: Left Arm, Patient Position: Sitting, Cuff Size: Normal)   Pulse 67   Resp 14   Ht 5\' 9"  (1.753 m)   Wt 225 lb (102.1 kg)   BMI 33.23 kg/m    Physical Exam Constitutional:      Appearance: She is obese.  Cardiovascular:     Rate and Rhythm: Normal rate and regular rhythm.     Heart sounds: Murmur heard.  Pulmonary:     Effort: Pulmonary effort is normal.     Breath sounds: Normal breath sounds.  Skin:    General: Skin is warm and dry.     Findings: No rash.  Neurological:     Mental Status: She is alert.  Psychiatric:        Mood and Affect: Mood normal.      Musculoskeletal Exam:  Neck full ROM no tenderness Shoulders full ROM no tenderness or swelling Elbows full ROM no tenderness or swelling Wrists full ROM no tenderness or swelling Fingers full ROM no tenderness or swelling Mild right lumbar paraspinal muscle tenderness, no radiation, no right  lateral hip tenderness, left side normal Knees full ROM right knee trace swelling, patellofemoral crepitus b/l    Investigation: No additional  findings.  Imaging: ECHOCARDIOGRAM COMPLETE  Result Date: 10/30/2022    ECHOCARDIOGRAM REPORT   Patient Name:   ANJULIE DIPIERRO Date of Exam: 10/30/2022 Medical Rec #:  [id number]         Height:       69.0 in Accession #:    [id number]        Weight:       225.0 lb Date of Birth:  19-Apr-1961         BSA:          2.172 m Patient Age:    61 years          BP:           128/84 mmHg Patient Gender: F                 HR:           62 bpm. Exam Location:  Outpatient Procedure: 2D Echo, Cardiac Doppler, Color Doppler and Strain Analysis Indications:    Stroke I63.9  History:        Patient has prior history of Echocardiogram examinations, most                 recent 07/28/2016. TIA; Risk Factors:Sleep Apnea and                 Dyslipidemia.  Sonographer:    Eulah Pont RDCS Referring Phys: [id number] Van Clines  Sonographer Comments: Global longitudinal strain was attempted. IMPRESSIONS  1. False tendon in LV apex of no clinical significance. Left ventricular ejection fraction, by estimation, is 55 to 60%. The left ventricle has normal function. The left ventricle has no regional wall motion abnormalities. There is mild concentric left ventricular hypertrophy. Left ventricular diastolic parameters are consistent with Grade I diastolic dysfunction (impaired relaxation). The average left ventricular global longitudinal strain is -21.4 %. The global longitudinal strain is normal.  2. Right ventricular systolic function is normal. The right ventricular size is normal. Tricuspid regurgitation signal is inadequate for assessing PA pressure.  3. The mitral valve is normal in structure. Trivial mitral valve regurgitation. No evidence of mitral stenosis.  4. The aortic valve is tricuspid. Aortic valve regurgitation is moderate visually and mild by PHT. The AI is eccentric.  Aortic valve sclerosis/calcification is present, without any evidence of aortic stenosis.  5. Pulmonic valve regurgitation is moderate.  6. Aortic dilatation noted. There is moderate dilatation of the ascending aorta, measuring 44 mm.  7. The inferior vena cava is normal in size with greater than 50% respiratory variability, suggesting right atrial pressure of 3 mmHg.  8. 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 eccentric AI jet seen in images #34 and 67. Recommend TEE with agitated saline contrast injection. FINDINGS  Left Ventricle: False tendon in LV apex of no clinical significance. 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 eccentric AI jet. Recommend TEE with agitated saline contrast injection. Left ventricular ejection fraction, by estimation, is 55 to 60%. The left ventricle has normal function. The left ventricle has no regional wall motion abnormalities. The average left ventricular global longitudinal strain is -21.4 %. The global longitudinal strain is normal. The left ventricular internal cavity size was normal in size. There is mild concentric left ventricular hypertrophy. Left ventricular diastolic parameters are consistent with Grade I diastolic dysfunction (impaired relaxation). Normal left ventricular filling pressure. Right Ventricle: The right ventricular size is normal. No increase in right ventricular wall thickness. Right  ventricular systolic function is normal. Tricuspid regurgitation signal is inadequate for assessing PA pressure. Left Atrium: Left atrial size was normal in size. Right Atrium: Right atrial size was normal in size. Pericardium: There is no evidence of pericardial effusion. Mitral Valve: The mitral valve is normal in structure. Trivial mitral valve regurgitation. No evidence of mitral valve stenosis. Tricuspid Valve: The tricuspid valve is normal in structure. Tricuspid valve regurgitation is mild . No evidence of tricuspid stenosis. Aortic  Valve: The aortic valve is tricuspid. Aortic valve regurgitation is moderate. Aortic regurgitation PHT measures 577 msec. Aortic valve sclerosis/calcification is present, without any evidence of aortic stenosis. Pulmonic Valve: The pulmonic valve was normal in structure. Pulmonic valve regurgitation is moderate. No evidence of pulmonic stenosis. Aorta: Aortic dilatation noted. There is moderate dilatation of the ascending aorta, measuring 44 mm. Venous: The inferior vena cava is normal in size with greater than 50% respiratory variability, suggesting right atrial pressure of 3 mmHg. IAS/Shunts: No atrial level shunt detected by color flow Doppler.  LEFT VENTRICLE PLAX 2D LVIDd:         4.60 cm      Diastology LVIDs:         3.20 cm      LV e' medial:    6.81 cm/s LV PW:         1.10 cm      LV E/e' medial:  5.6 LV IVS:        1.20 cm      LV e' lateral:   10.10 cm/s LVOT diam:     2.10 cm      LV E/e' lateral: 3.8 LV SV:         82 LV SV Index:   38           2D Longitudinal Strain LVOT Area:     3.46 cm     2D Strain GLS (A2C):   -21.6 %                             2D Strain GLS (A3C):   -23.2 %                             2D Strain GLS (A4C):   -19.5 % LV Volumes (MOD)            2D Strain GLS Avg:     -21.4 % LV vol d, MOD A2C: 91.2 ml LV vol d, MOD A4C: 104.0 ml LV vol s, MOD A2C: 38.5 ml LV vol s, MOD A4C: 45.5 ml LV SV MOD A2C:     52.7 ml LV SV MOD A4C:     104.0 ml LV SV MOD BP:      55.7 ml RIGHT VENTRICLE RV S prime:     11.40 cm/s TAPSE (M-mode): 2.5 cm LEFT ATRIUM             Index        RIGHT ATRIUM           Index LA diam:        3.50 cm 1.61 cm/m   RA Area:     14.80 cm LA Vol (A2C):   39.2 ml 18.05 ml/m  RA Volume:   36.00 ml  16.57 ml/m LA Vol (A4C):   37.6 ml 17.31 ml/m LA Biplane Vol: 39.7 ml 18.28 ml/m  AORTIC VALVE  PULMONIC VALVE LVOT Vmax:   97.30 cm/s  PR End Diast Vel: 3.36 msec LVOT Vmean:  66.900 cm/s LVOT VTI:    0.237 m AI PHT:      577 msec  AORTA Ao Root diam: 3.70  cm Ao Asc diam:  4.40 cm MITRAL VALVE MV Area (PHT): 1.79 cm    SHUNTS MV Decel Time: 423 msec    Systemic VTI:  0.24 m MV E velocity: 38.10 cm/s  Systemic Diam: 2.10 cm MV A velocity: 61.10 cm/s MV E/A ratio:  0.62 Armanda Magic MD Electronically signed by Armanda Magic MD Signature Date/Time: 10/30/2022/10:23:23 AM    Final     Recent Labs: Lab Results  Component Value Date   WBC 9.3 09/23/2022   HGB 11.8 (L) 09/23/2022   PLT 404 (H) 09/23/2022   NA 138 09/23/2022   K 3.2 (L) 09/23/2022   CL 102 09/23/2022   CO2 26 09/23/2022   GLUCOSE 133 (H) 09/23/2022   BUN 11 09/23/2022   CREATININE 0.77 09/23/2022   BILITOT 0.9 09/23/2022   ALKPHOS 70 09/23/2022   AST 20 09/23/2022   ALT 14 09/23/2022   PROT 8.0 09/23/2022   ALBUMIN 3.9 09/23/2022   CALCIUM 8.8 (L) 09/23/2022   GFRAA >60 11/27/2019   QFTBGOLDPLUS NEGATIVE 01/11/2021    Speciality Comments: PLQ eye exam 05/20/2021 at [facility] f/u 12 months  Procedures:  No procedures performed Allergies: Egg-derived products and Lactose intolerance (gi)   Assessment / Plan:     Visit Diagnoses: Systemic lupus (HCC) - Plan: Sedimentation rate, C-reactive protein, Anti-DNA antibody, double-stranded, C3 and C4  No significant synovitis lymphadenopathy or active rashes on exam today.  But has some increase in joint pain and swelling recently also multiple recent medical events.  Has a history of TTP questionably associated with autoimmune disease but did not have drop in platelets or renal involvement along with the stroke.  Will recheck sed rate CRP double-stranded DNA and serum complements evaluating evidence of active systemic lupus.  Plan to continue hydroxychloroquine 400 mg daily.  Need updated retinal toxicity screening documented.  Raynaud's phenomenon without gangrene  No new worsening no evidence of ischemic injury or lesions on exam today.  Abnormal brain MRI Aortic root dilatation (HCC)  New recent stroke still  has ongoing workup.  She has a prominent murmur including diastolic murmur is present on exam today.  On chart review last transthoracic echocardiogram from 2018 did show some stenosis and regurgitation.  She is already planned for updated transthoracic echo coming up.   Orders: Orders Placed This Encounter  Procedures   Sedimentation rate   C-reactive protein   Anti-DNA antibody, double-stranded   C3 and C4   No orders of the defined types were placed in this encounter.    Follow-Up Instructions: Return in about 3 months (around 01/25/2023) for UCTD/?SLE on HCQ f/u 3mos.   Fuller Plan, MD  Note - This record has been created using [employer].  Chart creation errors have been sought, but may not always  have been located. Such creation errors do not reflect on  the standard of medical care.

## 2022-10-26 ENCOUNTER — Telehealth: Payer: Self-pay

## 2022-10-26 LAB — C3 AND C4
C3 Complement: 139 mg/dL (ref 83–193)
C4 Complement: 30 mg/dL (ref 15–57)

## 2022-10-26 LAB — C-REACTIVE PROTEIN: CRP: 6.4 mg/L (ref <8.0)

## 2022-10-26 LAB — SEDIMENTATION RATE: Sed Rate: 25 mm/h (ref 0–30)

## 2022-10-26 LAB — ANTI-DNA ANTIBODY, DOUBLE-STRANDED: ds DNA Ab: 1 IU/mL

## 2022-10-26 NOTE — Telephone Encounter (Signed)
Pt insurance called no PA required for her echo reference number [phone] pt scheduled for 10/30/22 at 9:05. Pt called no answer left a voice mail she is scheduled that and if she can't keep the 6/17 appointment to call [phone] to reschedule

## 2022-10-30 ENCOUNTER — Ambulatory Visit (HOSPITAL_COMMUNITY)
Admission: RE | Admit: 2022-10-30 | Discharge: 2022-10-30 | Disposition: A | Discharge status: Home / Self Care | Payer: Managed Care, Other (non HMO) | Source: Ambulatory Visit | Attending: Neurology | Admitting: Neurology

## 2022-10-30 DIAGNOSIS — I6389 Other cerebral infarction: Secondary | ICD-10-CM

## 2022-10-30 DIAGNOSIS — G4730 Sleep apnea, unspecified: Secondary | ICD-10-CM | POA: Insufficient documentation

## 2022-10-30 DIAGNOSIS — I351 Nonrheumatic aortic (valve) insufficiency: Secondary | ICD-10-CM | POA: Insufficient documentation

## 2022-10-30 DIAGNOSIS — I6339 Cerebral infarction due to thrombosis of other cerebral artery: Secondary | ICD-10-CM | POA: Insufficient documentation

## 2022-10-30 DIAGNOSIS — Z8673 Personal history of transient ischemic attack (TIA), and cerebral infarction without residual deficits: Secondary | ICD-10-CM | POA: Insufficient documentation

## 2022-10-30 DIAGNOSIS — E785 Hyperlipidemia, unspecified: Secondary | ICD-10-CM | POA: Insufficient documentation

## 2022-10-30 LAB — ECHOCARDIOGRAM COMPLETE
Area-P 1/2: 1.79 cm2
Calc EF: 56.5 %
P 1/2 time: 577 msec
S' Lateral: 3.2 cm
Single Plane A2C EF: 57.8 %
Single Plane A4C EF: 56.3 %

## 2022-10-30 NOTE — Progress Notes (Signed)
Echocardiogram 2D Echocardiogram has been performed.  Deanna [name] 10/30/2022, 10:00 AM

## 2022-11-01 ENCOUNTER — Telehealth: Payer: Self-pay | Admitting: Neurology

## 2022-11-01 DIAGNOSIS — R931 Abnormal findings on diagnostic imaging of heart and coronary circulation: Secondary | ICD-10-CM

## 2022-11-01 NOTE — Addendum Note (Signed)
Addended by: Dimas Chyle on: 11/01/2022 03:29 PM   Modules accepted: Orders

## 2022-11-01 NOTE — Telephone Encounter (Signed)
Pt wants to know if we have the results of her test that she had done

## 2022-11-01 NOTE — Telephone Encounter (Signed)
Pt called an informed that echocardiogram showed a possible shunt between the left and right sides of the heart, Cardiologist who read the study recommended further evaluation. Cardiologist referral placed

## 2022-11-01 NOTE — Telephone Encounter (Signed)
Pls let her know the echocardiogram showed  a possible shunt between the left and right sides of the heart, Cardiologist who read the study recommended further evaluation. Does she have a cardiologist? If not, pls refer to Cardiology for VSD, abnormal echocardiogram needing TEE. Thanks

## 2022-11-03 ENCOUNTER — Other Ambulatory Visit: Payer: Self-pay | Admitting: Neurology

## 2022-11-03 DIAGNOSIS — R200 Anesthesia of skin: Secondary | ICD-10-CM

## 2022-11-10 ENCOUNTER — Encounter: Payer: Self-pay | Admitting: Neurology

## 2022-11-22 ENCOUNTER — Other Ambulatory Visit: Payer: Self-pay | Admitting: Neurology

## 2022-11-22 ENCOUNTER — Ambulatory Visit
Admission: RE | Admit: 2022-11-22 | Discharge: 2022-11-22 | Disposition: A | Discharge status: Home / Self Care | Payer: Managed Care, Other (non HMO) | Source: Ambulatory Visit | Attending: Neurology | Admitting: Neurology

## 2022-11-22 DIAGNOSIS — Z8673 Personal history of transient ischemic attack (TIA), and cerebral infarction without residual deficits: Secondary | ICD-10-CM

## 2022-11-22 DIAGNOSIS — I6339 Cerebral infarction due to thrombosis of other cerebral artery: Secondary | ICD-10-CM

## 2022-11-22 MED ORDER — IOPAMIDOL (ISOVUE-370) INJECTION 76%
120.0000 mL | Freq: Once | INTRAVENOUS | Status: AC | PRN
  Administered 2022-11-22: 120 mL via INTRAVENOUS

## 2022-11-28 ENCOUNTER — Telehealth: Payer: Self-pay | Admitting: Neurology

## 2022-11-28 NOTE — Telephone Encounter (Signed)
Results are not resulted yet.

## 2022-11-28 NOTE — Telephone Encounter (Signed)
Patient called requesting CT results /KB

## 2022-11-28 NOTE — Telephone Encounter (Signed)
Patient advised, results are not available at this time, will call once results are resulted.

## 2022-11-29 ENCOUNTER — Telehealth: Payer: Self-pay | Admitting: *Deleted

## 2022-11-29 NOTE — Telephone Encounter (Signed)
Patient contacted the office stating she is having increased bruising and extreme fatigue. Patient states she is completely exhausted by the time she gets home in the evening. Patient is requesting to have some blood work done. Patient states she is also still having issues with the shortness of breath she has made you aware of. Patient states she has not gotten in to see cardiology yet.

## 2022-12-06 ENCOUNTER — Ambulatory Visit: Payer: Managed Care, Other (non HMO) | Admitting: Family Medicine

## 2022-12-06 ENCOUNTER — Encounter: Payer: Self-pay | Admitting: Family Medicine

## 2022-12-06 VITALS — BP 140/92 | HR 67 | Temp 98.6°F | Ht 69.0 in | Wt 224.8 lb

## 2022-12-06 DIAGNOSIS — I77810 Thoracic aortic ectasia: Secondary | ICD-10-CM

## 2022-12-06 DIAGNOSIS — R030 Elevated blood-pressure reading, without diagnosis of hypertension: Secondary | ICD-10-CM | POA: Diagnosis not present

## 2022-12-06 DIAGNOSIS — D229 Melanocytic nevi, unspecified: Secondary | ICD-10-CM

## 2022-12-06 DIAGNOSIS — I358 Other nonrheumatic aortic valve disorders: Secondary | ICD-10-CM

## 2022-12-06 DIAGNOSIS — R233 Spontaneous ecchymoses: Secondary | ICD-10-CM

## 2022-12-06 DIAGNOSIS — M359 Systemic involvement of connective tissue, unspecified: Secondary | ICD-10-CM

## 2022-12-06 DIAGNOSIS — R42 Dizziness and giddiness: Secondary | ICD-10-CM | POA: Diagnosis not present

## 2022-12-06 DIAGNOSIS — Z7901 Long term (current) use of anticoagulants: Secondary | ICD-10-CM

## 2022-12-06 DIAGNOSIS — I517 Cardiomegaly: Secondary | ICD-10-CM

## 2022-12-06 NOTE — Progress Notes (Signed)
Established Patient Office Visit   Subjective  Patient ID: Deanna [name], female    DOB: 1961-01-22  Age: 62 y.o. MRN: [id number]  Chief Complaint  Patient presents with   Bleeding/Bruising    Patient complains of sudden onset of increased bruising of her skin x1 week, no known injury   Dizziness    Intermittently for some time    Patient is a 62 year old female seen for ongoing concerns.  Pt requesting labs.  Notes easy bruising x 1 month or so.  Does not recall bumping into anything but will notice bruising on hands or extremities.  Currently on Plavix and aspirin 81 mg daily.  Recently noted to have a murmur.  Echo showed AV sclerosis.  Has an appointment with cardiology next month.  Still having dizziness.  Patient notes some increased stress as her husband just got out of the hospital.  Patient trying to walk for exercise and to help with stress.  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  Followed by rheumatology for undifferentiated connective tissue disease.  Taking Plaquenil.  Pt has several nevi on left side of face.  Slightly larger in size. Sore.  Dizziness    Patient Active Problem List   Diagnosis Date Noted   Pain in right leg 01/25/2022   Recurrent sinusitis 06/09/2021   Pedal edema 04/18/2021   High risk medication use 01/11/2021   Spontaneous bruising 10/19/2020   Undifferentiated connective tissue disease (HCC) 09/07/2020   Raynaud's phenomenon 09/07/2020   Onychomycosis 07/25/2019   Varicose veins of both legs with edema 07/25/2019   H. 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   Aortic root dilatation (HCC) 08/07/2016   Ear pain 07/22/2016   Hyperlipemia 12/02/2015   H/O splenectomy 12/02/2015   Hyperglycemia 01/27/2014   Obesity 01/27/2014    Vitamin D deficiency 09/11/2012   Past Surgical History:  Procedure Laterality Date   ABDOMINAL HYSTERECTOMY  2006   benign tumor, complete   KNEE ARTHROSCOPY Right    NASAL SEPTUM SURGERY     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   Social History   Tobacco Use   Smoking status: Never   Smokeless tobacco: Never  Vaping Use   Vaping status: Never Used  Substance Use Topics   Alcohol use: No   Drug use: No   Family History  Problem Relation Age of Onset   Arthritis Mother    Hyperlipidemia Mother    Stroke Mother    Hypertension Mother    Hypertension Father    Lupus Maternal Aunt    Breast cancer Daughter    Lupus Son    Colon cancer Neg Hx    Allergies  Allergen Reactions   Egg-Derived Products Nausea And Vomiting   Lactose Intolerance (Gi) Other (See Comments)    vomiting      Review of Systems  Neurological:  Positive for dizziness.   Negative unless stated above    Objective:     BP (!) 140/92 (BP Location: Right Arm, Patient Position: Sitting, Cuff Size: Large)   Pulse 67   Temp 98.6 F (37 C) (Oral)   Ht 5\' 9"  (1.753 m)   Wt 224 lb 12.8 oz (102 kg)   SpO2 98%   BMI 33.20  kg/m  BP Readings from Last 3 Encounters:  12/06/22 (!) 140/92  10/25/22 128/84  09/24/22 (!) 149/95   Wt Readings from Last 3 Encounters:  12/06/22 224 lb 12.8 oz (102 kg)  10/25/22 225 lb (102.1 kg)  09/06/22 226 lb (102.5 kg)      Physical Exam Constitutional:      General: She is not in acute distress.    Appearance: Normal appearance.  HENT:     Head: Normocephalic and atraumatic.     Nose: Nose normal.     Mouth/Throat:     Mouth: Mucous membranes are moist.  Cardiovascular:     Rate and Rhythm: Normal rate and regular rhythm.     Heart sounds: Murmur heard.     No gallop.     Comments: 2/6 murmur best heard at LLSB.  Slight click appreciated Pulmonary:     Effort: Pulmonary effort is normal. No respiratory distress.     Breath sounds: Normal  breath sounds. No wheezing, rhonchi or rales.  Musculoskeletal:     Right lower leg: No edema.     Left lower leg: No edema.  Skin:    General: Skin is warm and dry.     Comments: Nevi on left side of temple at hairline and lateral to left eye.  Neurological:     Mental Status: She is alert and oriented to person, place, and time.   Marland Kitchenphq   No results found for any visits on 12/06/22.    Assessment & Plan:  Easy bruising -Likely 2/2 current use of Plavix and aspirin -     Comprehensive metabolic panel -     CBC with Differential/Platelet  Chronic anticoagulation  Elevated blood pressure reading in office without diagnosis of hypertension -Patient encouraged to monitor BP at home.  For elevations consistently greater than 140/90 start medication -Lifestyle modifications  Dizziness -Advised likely multifactorial--elevation in blood pressure, dehydration, aortic root dilation/valve problems could also be contributing to symptoms. -Encouraged to keep upcoming appointment cardiology. -     CBC with Differential/Platelet -     TSH -     T4, free  Aortic root dilatation (HCC) -Noted on echo 10/30/2022  Aortic valve sclerosis -Murmur appreciated on exam -Noted on echo 10/30/2022  LVH (left ventricular hypertrophy) -Echo from 10/30/2022 with EF 55-60%, mild LVH, grade 1 diastolic dysfunction, moderate AV regurg, possible VSD, aortic dilation -Patient encouraged to keep upcoming appointment with cardiology  Undifferentiated connective tissue disease (HCC) -Continue follow-up with rheumatology, Dr. Dimple Casey -Continue Plaquenil  Multiple nevi       -Ambulatory referral to dermatology Return if symptoms worsen or fail to improve.   [name], MD

## 2022-12-07 ENCOUNTER — Telehealth: Payer: Self-pay | Admitting: Family Medicine

## 2022-12-07 LAB — T4, FREE: Free T4: 0.92 ng/dL (ref 0.60–1.60)

## 2022-12-07 LAB — CBC WITH DIFFERENTIAL/PLATELET
Basophils Absolute: 0.1 10*3/uL (ref 0.0–0.1)
Basophils Relative: 1.4 % (ref 0.0–3.0)
Eosinophils Absolute: 0.2 10*3/uL (ref 0.0–0.7)
HCT: 37.8 % (ref 36.0–46.0)
Hemoglobin: 12.3 g/dL (ref 12.0–15.0)
Lymphocytes Relative: 29.9 % (ref 12.0–46.0)
Lymphs Abs: 2.6 10*3/uL (ref 0.7–4.0)
MCHC: 32.4 g/dL (ref 30.0–36.0)
MCV: 89.3 fl (ref 78.0–100.0)
Monocytes Absolute: 0.9 10*3/uL (ref 0.1–1.0)
Monocytes Relative: 9.6 % (ref 3.0–12.0)
Neutro Abs: 5 10*3/uL (ref 1.4–7.7)
Neutrophils Relative %: 56.9 % (ref 43.0–77.0)
Platelets: 461 10*3/uL — ABNORMAL HIGH (ref 150.0–400.0)
RBC: 4.24 Mil/uL (ref 3.87–5.11)
RDW: 13.9 % (ref 11.5–15.5)

## 2022-12-07 LAB — COMPREHENSIVE METABOLIC PANEL
ALT: 13 U/L (ref 0–35)
AST: 16 U/L (ref 0–37)
Albumin: 4.4 g/dL (ref 3.5–5.2)
Alkaline Phosphatase: 70 U/L (ref 39–117)
BUN: 14 mg/dL (ref 6–23)
CO2: 29 mEq/L (ref 19–32)
Calcium: 10.1 mg/dL (ref 8.4–10.5)
Chloride: 102 mEq/L (ref 96–112)
Creatinine, Ser: 0.91 mg/dL (ref 0.40–1.20)
GFR: 68.88 mL/min (ref >60.00)
Glucose, Bld: 81 mg/dL (ref 70–99)
Sodium: 139 mEq/L (ref 135–145)
Total Protein: 8.3 g/dL (ref 6.0–8.3)

## 2022-12-07 LAB — TSH: TSH: 1.28 u[IU]/mL (ref 0.35–5.50)

## 2022-12-07 NOTE — Telephone Encounter (Signed)
Referral placed.

## 2022-12-07 NOTE — Addendum Note (Signed)
Addended by: [name] on: 12/07/2022 04:39 PM   Modules accepted: Orders

## 2022-12-07 NOTE — Telephone Encounter (Signed)
Pt is calling and did not see on her AVS that a referral will be make to see dermatologist for moles on her face

## 2022-12-08 DIAGNOSIS — Z1231 Encounter for screening mammogram for malignant neoplasm of breast: Secondary | ICD-10-CM

## 2022-12-20 ENCOUNTER — Other Ambulatory Visit: Payer: Self-pay | Admitting: Neurology

## 2022-12-20 DIAGNOSIS — R200 Anesthesia of skin: Secondary | ICD-10-CM

## 2022-12-29 ENCOUNTER — Ambulatory Visit: Payer: Managed Care, Other (non HMO) | Admitting: Neurology

## 2023-01-08 ENCOUNTER — Encounter: Payer: Self-pay | Admitting: Cardiovascular Disease

## 2023-01-08 NOTE — H&P (View-Only) (Signed)
 Cardiology Office Note:  .   Date:  01/11/2023  ID:  Deanna [name], DOB 28-Jul-1960, MRN [id number] PCP: Mercer Clotilda SAUNDERS, MD  [facility] Providers Cardiologist:  Zya Finkle    History of Present Illness: .   Deanna [name] is a 62 y.o. female with hx of HTN, aortic sclerosis, aortic sclerosis , hx of stroke , SLE   Seen with husband, [name]   Has DOE with walking , has some chest pressure with walking  The chest pressure is associated with the DOE This chest pressure / DOE is new - has had DOE for a month or so  Has been walking for years  The DOE and chest pressure lasts for several minutes   Usually does 1 mile at the gym   Is on plavix  for a previous stroke   Echocardiogram from October 30, 2022 shows normal left ventricular systolic function with a EF of 55 to 60%.  She has grade 1 diastolic dysfunction. Trivial mitral regurgitation Moderate aortic insufficiency with calcification of the aortic valve.  There is no evidence of aortic stenosis. 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 There is a possible perimembranous VSD ( clinically not likely)     Fam hx :  Strong hx of strokes    ROS:    Studies Reviewed: SABRA       EKG Interpretation Date/Time:  Thursday January 11 2023 08:48:02 EDT Ventricular Rate:  64 PR Interval:  172 QRS Duration:  86 QT Interval:  414 QTC Calculation: 427 R Axis:   4  Text Interpretation: Normal sinus rhythm Minimal voltage criteria for LVH, may be normal variant ( R in aVL ) Cannot rule out Anterior infarct , age undetermined When compared with ECG of 23-Sep-2022 19:56, No significant change since last tracing Confirmed by Alveta Mungo [phone]) on 01/11/2023 8:58:50 AM    Risk Assessment/Calculations:             Physical Exam:   VS:  BP 132/84   Pulse 68   Ht 5\' 9"  (1.753 m)   Wt 230 lb (104.3 kg)   SpO2 98%   BMI 33.97 kg/m    Wt Readings from Last 3 Encounters:  01/11/23 230 lb (104.3  kg)  01/09/23 229 lb (103.9 kg)  12/06/22 224 lb 12.8 oz (102 kg)    GEN: Well nourished, well developed in no acute distress NECK: No JVD; No carotid bruits CARDIAC: RRR,  2-3/ 6 diastolic murmur  RESPIRATORY:  Clear to auscultation without rales, wheezing or rhonchi  ABDOMEN: Soft, non-tender, non-distended EXTREMITIES:  No edema; No deformity   ASSESSMENT AND PLAN: .       1.  Aortic insufficiency/shortness of breath with exertion: She is having severe shortness of breath with any exertion.  All of this may be due to the volume overload related to aortic insufficiency.  She also might have coronary artery disease.  Will give her Lasix  20 mg a day and potassium chloride  10 mill equivalents a day.  Will schedule her for a transesophageal echo for further evaluation of her aortic insufficiency and will also schedule a right left heart catheterization for pressure measurements and assess of her coronary arteries.  We discussed the risks, benefits, options of TEE .  She understands and agrees to proceed .  We have discussed the risk, benefits, options of heart catheterization.  She understands and agrees to proceed.     Dispo: 6-8 weeks   Signed, Mungo  Cardarius Senat, MD

## 2023-01-08 NOTE — Progress Notes (Unsigned)
Cardiology Office Note:  .   Date:  01/11/2023  ID:  Deanna [name], DOB 30-Jul-1960, MRN [id number] PCP: [name], MD  [facility] Providers Cardiologist:  Ryker Sudbury    History of Present Illness: .   Deanna [name] is a 62 y.o. female with hx of HTN, aortic sclerosis, aortic sclerosis , hx of stroke , SLE   Seen with husband, Deanna [name]   Has DOE with walking , has some chest pressure with walking  The chest pressure is associated with the DOE This chest pressure / DOE is new - has had DOE for a month or so  Has been walking for years  The DOE and chest pressure lasts for several minutes   Usually does 1 mile at the gym   Is on plavix for a previous stroke   Echocardiogram from October 30, 2022 shows normal left ventricular systolic function with a EF of 55 to 60%.  She has grade 1 diastolic dysfunction. Trivial mitral regurgitation Moderate aortic insufficiency with calcification of the aortic valve.  There is no evidence of aortic stenosis. 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 There is a possible perimembranous VSD ( clinically not likely)     Fam hx :  Strong hx of strokes    ROS:    Studies Reviewed: Marland Kitchen       EKG Interpretation Date/Time:  Thursday January 11 2023 08:48:02 EDT Ventricular Rate:  64 PR Interval:  172 QRS Duration:  86 QT Interval:  414 QTC Calculation: 427 R Axis:   4  Text Interpretation: Normal sinus rhythm Minimal voltage criteria for LVH, may be normal variant ( R in aVL ) Cannot rule out Anterior infarct , age undetermined When compared with ECG of 23-Sep-2022 19:56, No significant change since last tracing Confirmed by Kristeen Miss [id number]) on 01/11/2023 8:58:50 AM    Risk Assessment/Calculations:             Physical Exam:   VS:  BP 132/84   Pulse 68   Ht 5\' 9"  (1.753 m)   Wt 230 lb (104.3 kg)   SpO2 98%   BMI 33.97 kg/m    Wt Readings from Last 3 Encounters:  01/11/23 230 lb (104.3  kg)  01/09/23 229 lb (103.9 kg)  12/06/22 224 lb 12.8 oz (102 kg)    GEN: Well nourished, well developed in no acute distress NECK: No JVD; No carotid bruits CARDIAC: RRR,  2-3/ 6 diastolic murmur  RESPIRATORY:  Clear to auscultation without rales, wheezing or rhonchi  ABDOMEN: Soft, non-tender, non-distended EXTREMITIES:  No edema; No deformity   ASSESSMENT AND PLAN: .       1.  Aortic insufficiency/shortness of breath with exertion: She is having severe shortness of breath with any exertion.  All of this may be due to the volume overload related to aortic insufficiency.  She also might have coronary artery disease.  Will give her Lasix 20 mg a day and potassium chloride 10 mill equivalents a day.  Will schedule her for a transesophageal echo for further evaluation of her aortic insufficiency and will also schedule a right left heart catheterization for pressure measurements and assess of her coronary arteries.  We discussed the risks, benefits, options of TEE .  She understands and agrees to proceed .  We have discussed the risk, benefits, options of heart catheterization.  She understands and agrees to proceed.     Dispo: 6-8 weeks   Signed, Loistine Chance  Tymar Polyak, MD

## 2023-01-09 ENCOUNTER — Ambulatory Visit (INDEPENDENT_AMBULATORY_CARE_PROVIDER_SITE_OTHER): Payer: Managed Care, Other (non HMO) | Admitting: Neurology

## 2023-01-09 ENCOUNTER — Encounter: Payer: Self-pay | Admitting: Neurology

## 2023-01-09 DIAGNOSIS — R42 Dizziness and giddiness: Secondary | ICD-10-CM

## 2023-01-09 DIAGNOSIS — Z8673 Personal history of transient ischemic attack (TIA), and cerebral infarction without residual deficits: Secondary | ICD-10-CM

## 2023-01-09 DIAGNOSIS — I6339 Cerebral infarction due to thrombosis of other cerebral artery: Secondary | ICD-10-CM | POA: Diagnosis not present

## 2023-01-09 NOTE — Patient Instructions (Signed)
Good to see you  Continue all your medications  2. Discuss cholesterol with Dr. Salomon Fick  3. Proceed with Cardiology appointment. If symptoms worsen before then, go to ER  4. Here is the number for [facility]:  [phone]  5. Follow-up in 6 months, call for any changes

## 2023-01-09 NOTE — Progress Notes (Unsigned)
NEUROLOGY FOLLOW UP OFFICE NOTE  [name] [id number] 1961-02-17  HISTORY OF PRESENT ILLNESS: I had the pleasure of seeing Deanna [name] in follow-up in the neurology clinic on 01/09/2023.  The patient was last seen 4 months ago. She was initially seen in 2018 for episodes of vertigo. MRI brain in 2018 showed a faint punctate focus of reduced diffusion in the right frontal lobe deep white matter, difficult to see on the ADC map due to small size, this can be seen with acute ischemia or hyperacute demyelination. She was started on Plavix for secondary stroke prevention. Symptoms resolved then she called in June 2022 to report an increase in headaches, vertigo, left-sided partial numbness. Brain MRI no acute changes. On her last visit, she reported having Covid in January followed by continued dizziness, headaches, and nasal congestion. MRI brain with and without contrast done 10/2022 showed a punctate focus of probable restricted diffusion in the high right frontal cortex, suspicious for recent infarct, but artifact is also a differential consideration. 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  Sob, pin in chest, gotten the past week, can't walk so far, coughing, lays down when she coughs No more HAs; feels dizzy; when she bends down and ties shoe, feels nausea Feels anxious hands are weak both, fumbling a lot of stuff; left side better but now both can't hold No paresthesias just weakness Ocl neck pain on left side; doing exercises Lab Results  Component Value Date   HGBA1C 5.7 10/19/2022   Lab Results  Component Value Date   CHOL 166 10/19/2022   HDL 51.90 10/19/2022   LDLCALC 98 10/19/2022   LDLDIRECT 150.4 10/04/2012   TRIG 79.0 10/19/2022   CHOLHDL 3 10/19/2022     No emergent large vessel occlusion or proximal hemodynamically significant stenosis. Punctate focus of probable restricted diffusion in the high right  frontal cortex. No acute hemorrhage, hydrocephalus, extra-axial collection or mass lesion. T2/FLAIR hyperintensities in the white matter, similar. Prior right parietal cortical infarct.   Punctate focus of probable restricted diffusion in the high right frontal cortex, suspicious for recent infarct. Artifact is a differential consideration. No significant edema or mass effect. 2. Similar mild nonspecific T2/FLAIR hyperintensities and remote right parietal infarct. 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  The patient was last seen in November 2022. She was initially seen in 2018 for episodes of vertigo. MRI brain in 2018 showed a faint punctate focus of reduced diffusion in the right frontal lobe deep white matter, difficult to see on the ADC map due to small size, this can be seen with acute ischemia or hyperacute demyelination. She was started on Plavix for secondary stroke prevention. Symptoms resolved then she called in June 2022 to report an increase in headaches, vertigo, left-sided partial numbness. Brain MRI no acute changes. She was also reporting left arm symptoms due to cervical radiculopathy, improved with physical therapy.   She presents today at the recommendation of her ENT specialist. She is congested and coughing in the office today and states these symptoms have been occurring intermittently since January 2024. She first saw her PCP early January reporting 2 weeks of nasal congestion, post-nasal drainage, hoarse voice, occasional mild headaches. She was treated with Augmentin.  She then had Covid on 1/8 with a bad headache, chills, and cough. On 1/19, she had dizziness described as veering to the right (no spinning sensation or  lightheadedness), frontal pressure, diagnosed with sinusitis. On 2/6, she had a sore throat. On 3/2, she had a sore throat, mouth sores, which lasted until 3/8 then cleared up. On 3/21, she was sick and unable to work with congestion, runny nose, coughing. No head pressure or veering to one side. It lasted for 3 days. On 4/6, she felt like she was in a fog all day with frontal pressure for 2 days. She took Aleve which helped with the pressure and helped her focus more. This current bout started 3 days ago, she was on the couch all day Saturday and felt a little better on Sunday but noticed she was again going sideways when trying to walk. No spinning sensation. She reports mostly pressure in the frontal and maxillary regions. There is some nausea. She denies any blurred or double vision but sometimes feels like there is something on the left visual field that comes and goes but nothing is there. She has difficulty swallowing when sick. She was coughing a lot to the point her chest hurst. She blows her nose a lot, sometimes there is yellowish discharge, other times it is clear. She had seen ENT, last phone note states "discussed sinus CT results with her demonstrating normal sinuses except for a right maxillary sinus mucus retention cyst. She continues with headache and pressure. I recommended she follow-up with her neurologist."    History on Initial Assessment 07/20/2016: This is a pleasant 62 yo RH woman with a history of hyperlipidemia who reported a 3-year history of intermittent episodes of dizziness described as spinning, as well as feeling lightheaded with blurred vision lasting 20-30 seconds. Recently she started having different symptoms, around 2 weeks ago she started getting carsick, which is new for her. A week ago, her left hand was numb, "like it was not there," then 20-30 seconds later it started tingling. Face and leg were unaffected. She did not have any neck pain at that time, but 3 days later  started having a headache with pressure over the frontal regions that she ascribed to her sinuses. She started having pretty significant intermittent nausea. She continued to have dizziness, last episode was a week ago. The headaches have resolved. Blurred vision still comes and goes. She denies any diplopia, dysarthria/dysphagia, back pain, bowel/bladder dysfunction, negative Lhermitte sign. She denies any prior history of focal symptoms, no prior history of vision loss. She denies any head injuries. 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 There is a family history of stroke.   She had an MRI brain with and without contrast last 07/18/2016 which I personally reviewed, there was a faint punctate focus of reduced diffusion in the right frontal lobe deep white matter, difficult to see on the ADC map due to small size, this can be seen with acute ischemia or hyperacute demyelination. There were at least 5 additional subcentimeter white matter, right parietal, medial left thalamus, and left occipital horn FLAIR hyperintensities. There was mild prominence of the ventricles and sulci for age. No abnormal enhancement seen. 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    PAST MEDICAL HISTORY: Past Medical History:  Diagnosis Date   Allergy    GERD (gastroesophageal reflux disease)    "does not need meds anymore"   Heart  murmur    Hyperlipidemia    Lactose intolerance    Lupus (HCC)    Mini stroke    OSA (obstructive sleep apnea)    Positive ANA (antinuclear antibody)    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HCC)     MEDICATIONS: Current Outpatient Medications on File Prior to Visit  Medication Sig Dispense Refill   Cholecalciferol 100 MCG (4000 UT) CAPS Take by mouth daily.     clopidogrel (PLAVIX) 75 MG tablet TAKE 1 TABLET BY MOUTH EVERY DAY 30 tablet 0   Ferrous Sulfate (IRON PO) Take by mouth.     fexofenadine  (ALLEGRA) 180 MG tablet TAKE 1 TABLET BY MOUTH EVERY DAY (Patient taking differently: as needed.) 30 tablet 2   hydroxychloroquine (PLAQUENIL) 200 MG tablet Take 2 tablets (400 mg total) by mouth daily. 180 tablet 1   Naproxen Sodium (ALEVE PO) Take by mouth as needed.     No current facility-administered medications on file prior to visit.    ALLERGIES: Allergies  Allergen Reactions   Egg-Derived Products Nausea And Vomiting   Lactose Intolerance (Gi) Other (See Comments)    vomiting    FAMILY HISTORY: Family History  Problem Relation Age of Onset   Arthritis Mother    Hyperlipidemia Mother    Stroke Mother    Hypertension Mother    Hypertension Father    Lupus Maternal Aunt    Breast cancer Daughter    Lupus Son    Colon cancer Neg Hx     SOCIAL HISTORY: Social History   Socioeconomic History   Marital status: Married    Spouse name: Not on file   Number of children: Not on file   Years of education: Not on file   Highest education level: Not on file  Occupational History   Not on file  Tobacco Use   Smoking status: Never   Smokeless tobacco: Never  Vaping Use   Vaping status: Never Used  Substance and Sexual Activity   Alcohol use: No   Drug use: No   Sexual activity: Not on file  Other Topics Concern   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 Situation: lives 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, husband and mother      Spiritual Beliefs: Christian      Lifestyle: walks a little a few days per week, diet is ok      Right Handed      Drinks Caffeine         Social Determinants of Health   Financial Resource Strain: Not on file  Food Insecurity: Not on file  Transportation Needs: Not on file  Physical Activity: Not on file  Stress: Not on file  Social Connections: Not on file  Intimate Partner Violence: Not on file     PHYSICAL EXAM: Vitals:   01/09/23 1109  BP: 126/80  Pulse: 66  SpO2: 100%    General: No acute distress Head:  Normocephalic/atraumatic Skin/Extremities: No rash, no edema Neurological Exam: alert and oriented to person, place, and time. No aphasia or dysarthria. Fund of knowledge is appropriate.  Recent and remote memory are intact.  Attention and concentration are normal.   Cranial nerves: Pupils equal, round. Extraocular movements intact with no nystagmus. Visual fields full.  No facial asymmetry.  Motor: Bulk and tone normal, muscle strength 5/5 throughout with no pronator drift.   Finger to nose testing intact.  Gait narrow-based  and steady, able to tandem walk adequately.  Romberg negative.   IMPRESSION: This is a pleasant 62 yo RH woman with a history of hyperlipidemia, who presented initially with a 3-year history of intermittent dizziness suggestive of vertigo that had worsened with nausea, headaches, and a transient episode of left hand numbness and tingling. MRI brain in 2018 had shown a faint punctate focus of reduced diffusion in the right frontal lobe deep white matter, difficult to see on the ADC map due to small size, this can be seen with acute ischemia or hyperacute demyelination. Repeat MRI did not show any progression or new changes. She is on Plavix for secondary stroke prevention. She presents today reporting a 60-month history of recurrent nasal congestion/runny nose with associated pressure over the frontal regions, sometimes she would veer to the right. Her neurological exam is normal. Check CBC with differential, ESR. We discussed doing a brain MRI without contrast to assess for underlying structural abnormality, however the nasal congestion/cough would not be neurological in nature. We can do symptomatic treatment of headaches with gabapentin, side effects discussed, start 100mg  qhs, can uptitrate as tolerated. Follow-up in 4 months, call for any changes.     Thank you for allowing me to participate in *** care.  Please do not hesitate to call for any  questions or concerns.  The duration of this appointment visit was *** minutes of face-to-face time with the patient.  Greater than 50% of this time was spent in counseling, explanation of diagnosis, planning of further management, and coordination of care.   Patrcia Dolly, M.D.   CC: ***

## 2023-01-11 ENCOUNTER — Encounter: Payer: Self-pay | Admitting: Cardiovascular Disease

## 2023-01-11 ENCOUNTER — Encounter: Payer: Self-pay | Admitting: *Deleted

## 2023-01-11 ENCOUNTER — Ambulatory Visit: Payer: Managed Care, Other (non HMO) | Attending: Cardiovascular Disease | Admitting: Cardiovascular Disease

## 2023-01-11 VITALS — BP 132/84 | HR 68 | Ht 69.0 in | Wt 230.0 lb

## 2023-01-11 DIAGNOSIS — R0609 Other forms of dyspnea: Secondary | ICD-10-CM

## 2023-01-11 DIAGNOSIS — Z79899 Other long term (current) drug therapy: Secondary | ICD-10-CM | POA: Diagnosis not present

## 2023-01-11 DIAGNOSIS — Z01812 Encounter for preprocedural laboratory examination: Secondary | ICD-10-CM

## 2023-01-11 DIAGNOSIS — E785 Hyperlipidemia, unspecified: Secondary | ICD-10-CM | POA: Diagnosis not present

## 2023-01-11 DIAGNOSIS — R079 Chest pain, unspecified: Secondary | ICD-10-CM | POA: Diagnosis not present

## 2023-01-11 DIAGNOSIS — I351 Nonrheumatic aortic (valve) insufficiency: Secondary | ICD-10-CM

## 2023-01-11 MED ORDER — FUROSEMIDE 20 MG PO TABS
20.0000 mg | ORAL_TABLET | Freq: Every day | ORAL | 2 refills | Status: DC
Start: 2023-01-11 — End: 2023-08-08

## 2023-01-11 MED ORDER — NITROGLYCERIN 0.4 MG SL SUBL
0.4000 mg | SUBLINGUAL_TABLET | SUBLINGUAL | 3 refills | Status: AC | PRN
Start: 2023-01-11 — End: ?

## 2023-01-11 MED ORDER — POTASSIUM CHLORIDE CRYS ER 10 MEQ PO TBCR
10.0000 meq | EXTENDED_RELEASE_TABLET | Freq: Every day | ORAL | 2 refills | Status: DC
Start: 2023-01-11 — End: 2023-02-23

## 2023-01-11 NOTE — Patient Instructions (Addendum)
Medication Instructions:   START TAKING NITROGLYCERIN 0.4 MG SUBLINGUAL (UNDER THE TONGUE)-PLACE ONE TABLET UNDER THE TONGUE EVERY 5 MINS AS NEEDED FOR CHEST PAIN--IF YOU ARE GETTING TO THE 3RD TABLET WITH NO RELIEF, PLEASE CALL 911   START TAKING LASIX 20 MG BY MOUTH DAILY  START TAKING POTASSIUM CHLORIDE 10 mEq BY MOUTH DAILY  *If you need a refill on your cardiac medications before your next appointment, please call your pharmacy*   Lab Work:  ON NEXT TUESDAY HERE IN OUR OFFICE 01/16/23--PRE-PROCEDURE LABS--CBC W DIFF AND BMET  If you have labs (blood work) drawn today and your tests are completely normal, you will receive your results only by: MyChart Message (if you have MyChart) OR A paper copy in the mail If you have any lab test that is abnormal or we need to change your treatment, we will call you to review the results.   Testing/Procedures:     Dear Deanna [name]  You are scheduled for a TEE (Transesophageal Echocardiogram) on Friday, September 6 with Dr. Eden Emms.  Please arrive at the [facility] (Main Entrance A) at [facility]: [address] [place], [place] [address] at 8:00 AM (This time is 1 HR AND 15 MINS before your procedure to ensure your preparation). Free valet parking service is available. You will check in at ADMITTING. The support person will be asked to wait in the waiting room.  It is OK to have someone drop you off and come back when you are ready to be discharged.      DIET:  Nothing to eat or drink after midnight except a sip of water with medications (see medication instructions below)  MEDICATION INSTRUCTIONS:  PLEASE FOLLOW CATH INSTRUCTIONS BELOW IN PREPARATION OF BOTH PROCEDURES ON 9/6  HOLD LASIX AND POTASSIUM THE DAY OF THIS PROCEDURE (HOLD ON 9/6)  LABS:  Come to the lab at [facility] at [address] between the hours of 8:00 am and 4:30 pm. You do NOT have to be fasting.  LAB IS ON NEXT TUESDAY 01/16/23 HERE  IN THE OFFICE--BMET AND CBC W DIFF  FYI:  For your safety, and to allow [place] to monitor your vital signs accurately during the surgery/procedure we request: If you have artificial nails, gel coating, SNS etc, please have those removed prior to your surgery/procedure. Not having the nail coverings /polish removed may result in cancellation or delay of your surgery/procedure.  You must have a responsible person to drive you home and stay in the waiting area during your procedure. Failure to do so could result in cancellation.  Bring your insurance cards.  *Special Note: Every effort is made to have your procedure done on time. Occasionally there are emergencies that occur at the hospital that may cause delays. Please be patient if a delay does occur.          Cardiac/Peripheral Catheterization   You are scheduled for a Cardiac Catheterization on Friday, September 6 with Dr. Verne Carrow.  1. Please arrive at the [facility] (Main Entrance A) at [facility]: [address] [place], [place] [address] at 8:00 AM FOR YOUR TEE THEN YOU WILL GO FOR CATH THEREAFTER. Free valet parking service is available. You will check in at ADMITTING. The support person will be asked to wait in the waiting room.  It is OK to have someone drop you off and come back when you are ready to be discharged.  Special note: Every effort is made to have your procedure done on time. Please understand that emergencies sometimes delay scheduled procedures.  2. Diet: Do not eat solid foods after midnight.  You may have clear liquids until 5 AM the day of the procedure.  3. Labs: You will need to have blood drawn on Tuesday, September 3 at [facility] at [facility] [address], [place]  Open: 7:30am - 5pm    Phone: [phone]. You do not need to be fasting.  4. Medication instructions in preparation for your procedure:   Contrast Allergy: No    HOLD YOUR FLUID PILL  (LASIX) AND POTASSIUM PILL THE MORNING OF YOUR TEE/CATH (HOLD ON 9/6)   On the morning of your procedure, take Plavix/Clopidogrel and any morning medicines NOT listed above.  You may use sips of water.  5. Plan to go home the same day, you will only stay overnight if medically necessary. 6. You MUST have a responsible adult to drive you home. 7. An adult MUST be with you the first 24 hours after you arrive home. 8. Bring a current list of your medications, and the last time and date medication taken. 9. Bring ID and current insurance cards. 10.Please wear clothes that are easy to get on and off and wear slip-on shoes.  Thank you for allowing [place] to care for you!   -- [facility]     Follow-Up:  IN 6-8 WEEKS WITH DR. Elease Hashimoto

## 2023-01-16 ENCOUNTER — Other Ambulatory Visit: Payer: Self-pay | Admitting: Internal Medicine

## 2023-01-16 ENCOUNTER — Ambulatory Visit: Payer: Managed Care, Other (non HMO) | Attending: Cardiovascular Disease

## 2023-01-16 DIAGNOSIS — R079 Chest pain, unspecified: Secondary | ICD-10-CM

## 2023-01-16 DIAGNOSIS — R0609 Other forms of dyspnea: Secondary | ICD-10-CM

## 2023-01-16 DIAGNOSIS — I351 Nonrheumatic aortic (valve) insufficiency: Secondary | ICD-10-CM

## 2023-01-16 DIAGNOSIS — Z79899 Other long term (current) drug therapy: Secondary | ICD-10-CM

## 2023-01-16 DIAGNOSIS — I7300 Raynaud's syndrome without gangrene: Secondary | ICD-10-CM

## 2023-01-16 DIAGNOSIS — E785 Hyperlipidemia, unspecified: Secondary | ICD-10-CM

## 2023-01-16 DIAGNOSIS — Z01812 Encounter for preprocedural laboratory examination: Secondary | ICD-10-CM

## 2023-01-16 NOTE — Telephone Encounter (Signed)
Last Fill: 04/26/2022  Eye exam: 05/20/2021    Labs: 12/06/2022  Platelets [phone]  Next Visit: 01/31/2023  Last Visit: 10/25/2022  [id number]   Current Dose per office note 10/25/2022: hydroxychloroquine 400 mg daily  Patient states she had an eye exam on 01/10/2023 at Dr. [facility]. Patient calling to have them fax over results.  Okay to refill Plaquenil?

## 2023-01-17 LAB — CBC WITH DIFFERENTIAL/PLATELET
Basophils Absolute: 0.1 10*3/uL (ref 0.0–0.2)
Basos: 1 %
EOS (ABSOLUTE): 0.2 10*3/uL (ref 0.0–0.4)
Eos: 2 %
Hematocrit: 35.2 % (ref 34.0–46.6)
Hemoglobin: 12 g/dL (ref 11.1–15.9)
Immature Grans (Abs): 0 10*3/uL (ref 0.0–0.1)
Immature Granulocytes: 0 %
Lymphocytes Absolute: 1.9 10*3/uL (ref 0.7–3.1)
Lymphs: 28 %
MCH: 29.4 pg (ref 26.6–33.0)
MCHC: 34.1 g/dL (ref 31.5–35.7)
MCV: 86 fL (ref 79–97)
Monocytes Absolute: 0.9 10*3/uL (ref 0.1–0.9)
Monocytes: 13 %
Neutrophils Absolute: 3.7 10*3/uL (ref 1.4–7.0)
Neutrophils: 56 %
Platelets: 421 10*3/uL (ref 150–450)
RBC: 4.08 x10E6/uL (ref 3.77–5.28)
RDW: 13.2 % (ref 11.7–15.4)
WBC: 6.6 10*3/uL (ref 3.4–10.8)

## 2023-01-17 LAB — BASIC METABOLIC PANEL
BUN/Creatinine Ratio: 18 (ref 9–23)
BUN: 16 mg/dL (ref 6–24)
CO2: 24 mmol/L (ref 20–29)
Calcium: 9.8 mg/dL (ref 8.7–10.2)
Chloride: 105 mmol/L (ref 96–106)
Creatinine, Ser: 0.89 mg/dL (ref 0.57–1.00)
Glucose: 72 mg/dL (ref 70–99)
Potassium: 4.4 mmol/L (ref 3.5–5.2)
Sodium: 140 mmol/L (ref 134–144)
eGFR: 75 mL/min/{1.73_m2} (ref >59)

## 2023-01-18 ENCOUNTER — Telehealth: Payer: Self-pay | Admitting: *Deleted

## 2023-01-18 NOTE — Progress Notes (Deleted)
Office Visit Note  Patient: Deanna [name]             Date of Birth: 1961-01-01           MRN: [id number]             PCP: [name], MD Referring: [name], MD Visit Date: 01/31/2023   Subjective:  No chief complaint on file.   History of Present Illness: Deanna [name] is a 62 y.o. female here for follow up ***   Previous HPI 10/25/2022 Deanna [name] is a 62 y.o. female here for follow up for lupus on hydroxychloroquine 400 mg daily.  Since last visit she has had a lot of medical events.  She was sick with upper respiratory infection symptoms for about 2 months and developed pleuritic chest pain imaging consistent with pleurisy as well.  Subsequently developed arm weakness and was found to have small stroke.  Did not have specific precipitating cause identified on workup and is doing well without significant residual afterwards.  She just had evaluation for some increase in right knee swelling a bit worse than usual.  Also having some tenderness in her hands around PIP joints without any visible changes.   Previous HPI 04/26/22 Deanna [name] is a 62 y.o. 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  Since her last visit her lateral hip pain is doing better.  She took a Flexeril for short time which was beneficial but noticed a lot of drowsiness or loopy sensation side effects so stopped taking this by now.  Biggest issue currently is her sleep.  Has a lot of fatigue and increase in stiffness associated with fragmented sleep.  She wakes up by 3 AM every morning and cannot fall back asleep or remaining asleep.  She is not taking naps during the day and is trying to follow a consistent sleep schedule without much improvement.  She has known OSA and reports consistent use of her CPAP.   Previous HPI 01/25/22 Deanna [name] is a 62 y.o. female here for follow up For lupus/RA on HCQ 400 mg daily.   Systemic symptoms are doing pretty well but is having increased trouble with pain in her low back and right leg.  Pain going down the lateral side of the leg from the hip down to the knee.  She is not seeing any visible swelling or abnormal appearance in the affected area.  She is not having any falls or severe mobility limitation.     Previous HPI 10/27/2021 Deanna [name] is a 62 y.o. 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 She was doing fairly well off prednisone after last visit. She had a good vacation trip to [place] in March. However afterwards was sick with COVID without serious complications. 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 She has had 3 sinus infections so far this year. She saw ENT late last year due to recurrent sinusitis problems. During these illnesses she has an increase in hand and wrist pains. Most recently developed burning type of pain affecting along her hairline and low back area. This comes and goes no visible changes associated. She noticed some hair loss and thinning at multiple areas during this time. She has tried avoiding any chemical exposures to her hair and scalp.  Previous HPI 04/18/21 Deanna [name] is a 62 y.o. female here for follow up for lupus on HCQ 400 mg PO daily and tapering off prednisone. She has had ongoing upper respiratory problems since our last visit with recurrent ear infection and upper respiratory infections most of the past 3 months. She has taken several rounds of antibiotics with clearance but quick return of symptoms. She was treated with high dose prednisone once after visiting to the emergency department. She has experienced bleeding and ulceration in the nose. She has also suffered spontaneous bruising in one digit for about 2 days at a time with spontaneous resolution. She had past ENT surgery for septum defect but  has not had chronic problems until all this started again after our last visit. She has noticed a small increased in lower extremity swelling with deeper sock band indentations, no discoloration or pain or significant weight change. She had her HCQ eye exam with [facility] okay to continue HCQ but they are planning to follow up in 2-4 weeks.   Previous HPI 01/11/21 Deanna [name] is a 62 y.o. female here for follow up for lupus after starting HCQ 400 mg PO daily and prednisone 5 mg PO daily.  Overall doing well on this but has continued the low-dose steroids.  She has not noticed problems with taking the medication.   09/07/20 Deanna [name] is a 62 y.o. female here for evaluation of positive ANA with fatigue and myalgias. Symptoms are persistent since last year with fatigue, pains in her hands and ankles and stiffness but she has had some joint pains going back for years. She typically notices swelling at the ankles by the end of the day. This is moderately painful but not debilitating. Her hand pain is worst with use during the day and she notices swelling when eating a lot of salt. 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 She does experience raynaud's symptoms with pallor in fingertips without any lesions ulcers or injuries. She denies problems with mouth and eye dryness, lymphadenopathy, chest pain, skin rashes, or photosensitive   [facility] Labs reviewed ANA 1:640 speckled CB-CAP EC4d 16 CB-CAP BC4d 90   Lupus manifestations Fatigue Arthralgia Raynaud's ?Spontaneous bruising Hx of TTP s/p splenectomy (primary vs secondary?)   Labs reviewed ANA pos Scl-70 1.3   No Rheumatology ROS completed.   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   Aortic root dilatation (HCC) 08/07/2016   Ear pain 07/22/2016   Hyperlipemia 12/02/2015   H/O splenectomy 12/02/2015   Hyperglycemia 01/27/2014   Obesity 01/27/2014   Vitamin D deficiency 09/11/2012    Past Medical History:  Diagnosis Date   Allergy    GERD (gastroesophageal reflux disease)    "does not need meds anymore"   Heart murmur    Hyperlipidemia  Lactose intolerance    Lupus (HCC)    Mini stroke    OSA (obstructive sleep apnea)    Positive ANA (antinuclear antibody)    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HCC)     Family History  Problem Relation Age of Onset   Arthritis Mother    Hyperlipidemia Mother    Stroke Mother    Hypertension Mother    Hypertension Father    Lupus Maternal Aunt    Breast cancer Daughter    Lupus Son    Colon cancer Neg Hx    Past Surgical History:  Procedure Laterality Date   ABDOMINAL HYSTERECTOMY  2006   benign tumor, complete   KNEE ARTHROSCOPY Right    NASAL SEPTUM SURGERY     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   Social History   Social History Narrative   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 Situation: lives 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, husband and mother      Spiritual Beliefs: Christian      Lifestyle: walks a little a few days per week, diet is ok      Right Handed      Drinks Caffeine         Immunization History  Administered Date(s) Administered   Meningococcal B, OMV 12/02/2015   PFIZER Comirnaty(Gray Top)Covid-19 Tri-Sucrose Vaccine 07/19/2019,  08/09/2019, 06/16/2020   PPD Test 01/21/2018   Pneumococcal Polysaccharide-23 10/14/2010, 10/21/2014     Objective: Vital Signs: There were no vitals taken for this visit.   Physical Exam   Musculoskeletal Exam: ***  CDAI Exam: CDAI Score: -- Patient Global: --; Provider Global: -- Swollen: --; Tender: -- Joint Exam 01/31/2023   No joint exam has been documented for this visit   There is currently no information documented on the homunculus. Go to the Rheumatology activity and complete the homunculus joint exam.  Investigation: No additional findings.  Imaging: No results found.  Recent Labs: Lab Results  Component Value Date   WBC 6.6 01/16/2023   HGB 12.0 01/16/2023   PLT 421 01/16/2023   NA 140 01/16/2023   K 4.4 01/16/2023   CL 105 01/16/2023   CO2 24 01/16/2023   GLUCOSE 72 01/16/2023   BUN 16 01/16/2023   CREATININE 0.89 01/16/2023   BILITOT 0.9 12/06/2022   ALKPHOS 70 12/06/2022   AST 16 12/06/2022   ALT 13 12/06/2022   PROT 8.3 12/06/2022   ALBUMIN 4.4 12/06/2022   CALCIUM 9.8 01/16/2023   GFRAA >60 11/27/2019   QFTBGOLDPLUS NEGATIVE 01/11/2021    Speciality Comments: PLQ eye exam 01/10/2023 Normal  @ [facility] f/u 6 months  Procedures:  No procedures performed Allergies: Egg-derived products and Lactose intolerance (gi)   Assessment / Plan:     Visit Diagnoses: No diagnosis found.  ***  Orders: No orders of the defined types were placed in this encounter.  No orders of the defined types were placed in this encounter.    Follow-Up Instructions: No follow-ups on file.   Metta Clines, RT  Note - This record has been created using [employer].  Chart creation errors have been sought, but may not always  have been located. Such creation errors do not reflect on  the standard of medical care.

## 2023-01-18 NOTE — Progress Notes (Signed)
Spoke to pt and instructed them to come at [facility] and to be NPO after 0000.   Confirmed that pt will have a ride home and someone to stay with them for 24 hours after the procedure.

## 2023-01-18 NOTE — Telephone Encounter (Signed)
Cardiac Catheterization scheduled at [facility] for: Friday January 19, 2023 10:30 AM/TEE 9 AM Arrival time [facility] Main Entrance A at: 8 AM  Nothing to eat or drink after midnight prior to procedures, may have water to take medications  Medication instructions:. -Hold:  Lasix/KCl-AM of procedure -Other usual morning medications can be taken with sips of water including aspirin 81 mg and Plavix 75 mg  Plan to go home the same day, you will only stay overnight if medically necessary.  You must have responsible adult to drive you home.  Someone must be with you the first 24 hours after you arrive home.  Reviewed procedure instructions with patient.

## 2023-01-19 ENCOUNTER — Ambulatory Visit (HOSPITAL_BASED_OUTPATIENT_CLINIC_OR_DEPARTMENT_OTHER)
Admission: RE | Admit: 2023-01-19 | Discharge: 2023-01-19 | Disposition: A | Discharge status: Home / Self Care | Payer: Managed Care, Other (non HMO) | Source: Ambulatory Visit | Attending: Cardiovascular Disease | Admitting: Cardiovascular Disease

## 2023-01-19 ENCOUNTER — Other Ambulatory Visit: Payer: Self-pay

## 2023-01-19 ENCOUNTER — Ambulatory Visit (HOSPITAL_COMMUNITY): Payer: Managed Care, Other (non HMO) | Admitting: Anesthesiology

## 2023-01-19 ENCOUNTER — Encounter (HOSPITAL_COMMUNITY): Payer: Self-pay | Admitting: Cardiovascular Disease

## 2023-01-19 ENCOUNTER — Ambulatory Visit (HOSPITAL_COMMUNITY)
Admission: RE | Admit: 2023-01-19 | Discharge: 2023-01-19 | Disposition: A | Discharge status: Home / Self Care | Payer: Managed Care, Other (non HMO) | Attending: Cardiovascular Disease | Admitting: Cardiovascular Disease

## 2023-01-19 ENCOUNTER — Encounter (HOSPITAL_COMMUNITY)
Admission: RE | Disposition: A | Discharge status: Home / Self Care | Payer: Self-pay | Source: Home / Self Care | Attending: Cardiovascular Disease

## 2023-01-19 ENCOUNTER — Encounter (HOSPITAL_COMMUNITY)
Admission: RE | Disposition: A | Discharge status: Home / Self Care | Payer: Managed Care, Other (non HMO) | Source: Home / Self Care | Attending: Cardiovascular Disease

## 2023-01-19 DIAGNOSIS — Z7902 Long term (current) use of antithrombotics/antiplatelets: Secondary | ICD-10-CM | POA: Insufficient documentation

## 2023-01-19 DIAGNOSIS — Z8673 Personal history of transient ischemic attack (TIA), and cerebral infarction without residual deficits: Secondary | ICD-10-CM | POA: Insufficient documentation

## 2023-01-19 DIAGNOSIS — M329 Systemic lupus erythematosus, unspecified: Secondary | ICD-10-CM | POA: Diagnosis not present

## 2023-01-19 DIAGNOSIS — E785 Hyperlipidemia, unspecified: Secondary | ICD-10-CM | POA: Diagnosis not present

## 2023-01-19 DIAGNOSIS — I10 Essential (primary) hypertension: Secondary | ICD-10-CM | POA: Diagnosis not present

## 2023-01-19 DIAGNOSIS — I351 Nonrheumatic aortic (valve) insufficiency: Secondary | ICD-10-CM | POA: Insufficient documentation

## 2023-01-19 DIAGNOSIS — R0609 Other forms of dyspnea: Secondary | ICD-10-CM | POA: Diagnosis not present

## 2023-01-19 DIAGNOSIS — Z01812 Encounter for preprocedural laboratory examination: Secondary | ICD-10-CM

## 2023-01-19 DIAGNOSIS — I700 Atherosclerosis of aorta: Secondary | ICD-10-CM | POA: Insufficient documentation

## 2023-01-19 HISTORY — PX: TEE WITHOUT CARDIOVERSION: SHX5443

## 2023-01-19 HISTORY — PX: RIGHT HEART CATH AND CORONARY ANGIOGRAPHY: CATH118264

## 2023-01-19 HISTORY — PX: AORTIC ARCH ANGIOGRAPHY: CATH118224

## 2023-01-19 LAB — ECHO TEE
AR max vel: 2.46 cm2
AV Area VTI: 2.42 cm2
AV Area mean vel: 2.19 cm2
AV Mean grad: 4 mmHg
AV Peak grad: 8.5 mmHg
Ao pk vel: 1.46 m/s
P 1/2 time: 758 ms

## 2023-01-19 LAB — POCT I-STAT 7, (LYTES, BLD GAS, ICA,H+H)
Acid-base deficit: 7 mmol/L — ABNORMAL HIGH (ref 0.0–2.0)
Bicarbonate: 20.1 mmol/L (ref 20.0–28.0)
Calcium, Ion: 1.04 mmol/L — ABNORMAL LOW (ref 1.15–1.40)
HCT: 35 % — ABNORMAL LOW (ref 36.0–46.0)
Hemoglobin: 11.9 g/dL — ABNORMAL LOW (ref 12.0–15.0)
O2 Saturation: 62 %
Potassium: 3.4 mmol/L — ABNORMAL LOW (ref 3.5–5.1)
Sodium: 129 mmol/L — ABNORMAL LOW (ref 135–145)
TCO2: 21 mmol/L — ABNORMAL LOW (ref 22–32)
pCO2 arterial: 44.3 mmHg (ref 32–48)
pH, Arterial: 7.265 — ABNORMAL LOW (ref 7.35–7.45)
pO2, Arterial: 37 mmHg — CL (ref 83–108)

## 2023-01-19 SURGERY — RIGHT HEART CATH AND CORONARY ANGIOGRAPHY
Anesthesia: LOCAL

## 2023-01-19 SURGERY — ECHOCARDIOGRAM, TRANSESOPHAGEAL
Anesthesia: Monitor Anesthesia Care

## 2023-01-19 MED ORDER — LIDOCAINE HCL (PF) 1 % IJ SOLN
INTRAMUSCULAR | Status: DC | PRN
  Administered 2023-01-19: 2 mL
  Administered 2023-01-19: 1 mL

## 2023-01-19 MED ORDER — ONDANSETRON HCL 4 MG/2ML IJ SOLN: 4.0000 mg | Freq: Four times a day (QID) | INTRAMUSCULAR | Status: DC | PRN

## 2023-01-19 MED ORDER — MIDAZOLAM HCL 2 MG/2ML IJ SOLN
INTRAMUSCULAR | Status: DC | PRN
  Administered 2023-01-19: 1 mg via INTRAVENOUS

## 2023-01-19 MED ORDER — LIDOCAINE HCL (PF) 1 % IJ SOLN
INTRAMUSCULAR | Status: AC
  Filled 2023-01-19: qty 30

## 2023-01-19 MED ORDER — VERAPAMIL HCL 2.5 MG/ML IV SOLN
INTRAVENOUS | Status: DC | PRN
  Administered 2023-01-19: 10 mL via INTRA_ARTERIAL

## 2023-01-19 MED ORDER — ACETAMINOPHEN 325 MG PO TABS: 650.0000 mg | ORAL_TABLET | ORAL | Status: DC | PRN

## 2023-01-19 MED ORDER — LABETALOL HCL 5 MG/ML IV SOLN
10.0000 mg | INTRAVENOUS | Status: DC | PRN
  Administered 2023-01-19 (×2): 10 mg via INTRAVENOUS

## 2023-01-19 MED ORDER — SODIUM CHLORIDE 0.9 % IV SOLN: 250.0000 mL | INTRAVENOUS | Status: DC | PRN

## 2023-01-19 MED ORDER — IOHEXOL 350 MG/ML SOLN
INTRAVENOUS | Status: DC | PRN
  Administered 2023-01-19: 60 mL

## 2023-01-19 MED ORDER — VERAPAMIL HCL 2.5 MG/ML IV SOLN
INTRAVENOUS | Status: AC
  Filled 2023-01-19: qty 2

## 2023-01-19 MED ORDER — SODIUM CHLORIDE 0.9 % IV SOLN: INTRAVENOUS | Status: DC

## 2023-01-19 MED ORDER — SODIUM CHLORIDE 0.9 % WEIGHT BASED INFUSION: 1.0000 mL/kg/h | INTRAVENOUS | Status: DC

## 2023-01-19 MED ORDER — HYDRALAZINE HCL 20 MG/ML IJ SOLN: 10.0000 mg | INTRAMUSCULAR | Status: DC | PRN

## 2023-01-19 MED ORDER — SODIUM CHLORIDE 0.9% FLUSH: 3.0000 mL | INTRAVENOUS | Status: DC | PRN

## 2023-01-19 MED ORDER — HEPARIN SODIUM (PORCINE) 1000 UNIT/ML IJ SOLN
INTRAMUSCULAR | Status: AC
  Filled 2023-01-19: qty 10

## 2023-01-19 MED ORDER — PROPOFOL 500 MG/50ML IV EMUL
INTRAVENOUS | Status: DC | PRN
  Administered 2023-01-19: 50 ug/kg/min via INTRAVENOUS

## 2023-01-19 MED ORDER — ONDANSETRON HCL 4 MG/2ML IJ SOLN
INTRAMUSCULAR | Status: DC | PRN
  Administered 2023-01-19: 4 mg via INTRAVENOUS

## 2023-01-19 MED ORDER — SODIUM CHLORIDE 0.9 % WEIGHT BASED INFUSION: 3.0000 mL/kg/h | INTRAVENOUS | Status: AC

## 2023-01-19 MED ORDER — PHENYLEPHRINE HCL-NACL 20-0.9 MG/250ML-% IV SOLN
INTRAVENOUS | Status: DC | PRN
  Administered 2023-01-19: 30 ug/min via INTRAVENOUS

## 2023-01-19 MED ORDER — HEPARIN (PORCINE) IN NACL 1000-0.9 UT/500ML-% IV SOLN
INTRAVENOUS | Status: DC | PRN
  Administered 2023-01-19 (×2): 500 mL

## 2023-01-19 MED ORDER — LABETALOL HCL 5 MG/ML IV SOLN
INTRAVENOUS | Status: AC
  Filled 2023-01-19: qty 4

## 2023-01-19 MED ORDER — SODIUM CHLORIDE 0.9% FLUSH: 3.0000 mL | Freq: Two times a day (BID) | INTRAVENOUS | Status: DC

## 2023-01-19 MED ORDER — LIDOCAINE 2% (20 MG/ML) 5 ML SYRINGE
INTRAMUSCULAR | Status: DC | PRN
  Administered 2023-01-19: 100 mg via INTRAVENOUS

## 2023-01-19 MED ORDER — ASPIRIN 81 MG PO CHEW: 81.0000 mg | CHEWABLE_TABLET | ORAL | Status: DC

## 2023-01-19 MED ORDER — MIDAZOLAM HCL 2 MG/2ML IJ SOLN
INTRAMUSCULAR | Status: AC
  Filled 2023-01-19: qty 2

## 2023-01-19 MED ORDER — FENTANYL CITRATE (PF) 100 MCG/2ML IJ SOLN
INTRAMUSCULAR | Status: DC | PRN
  Administered 2023-01-19: 25 ug via INTRAVENOUS

## 2023-01-19 MED ORDER — PROPOFOL 10 MG/ML IV BOLUS
INTRAVENOUS | Status: DC | PRN
  Administered 2023-01-19 (×2): 20 mg via INTRAVENOUS
  Administered 2023-01-19: 40 mg via INTRAVENOUS
  Administered 2023-01-19: 20 mg via INTRAVENOUS

## 2023-01-19 MED ORDER — HEPARIN SODIUM (PORCINE) 1000 UNIT/ML IJ SOLN
INTRAMUSCULAR | Status: DC | PRN
  Administered 2023-01-19: 5000 [IU] via INTRAVENOUS

## 2023-01-19 MED ORDER — FENTANYL CITRATE (PF) 100 MCG/2ML IJ SOLN
INTRAMUSCULAR | Status: AC
  Filled 2023-01-19: qty 2

## 2023-01-19 SURGICAL SUPPLY — 12 items
CATH 5FR JL3.5 JR4 ANG PIG MP (CATHETERS) IMPLANT
CATH BALLN WEDGE 5F 110CM (CATHETERS) IMPLANT
CATH INFINITI 5FR AL1 (CATHETERS) IMPLANT
DEVICE RAD COMP TR BAND LRG (VASCULAR PRODUCTS) IMPLANT
GLIDESHEATH SLEND SS 6F .021 (SHEATH) IMPLANT
GUIDEWIRE INQWIRE 1.5J.035X260 (WIRE) IMPLANT
INQWIRE 1.5J .035X260CM (WIRE) ×1
PACK CARDIAC CATHETERIZATION (CUSTOM PROCEDURE TRAY) ×2 IMPLANT
SET ATX-X65L (MISCELLANEOUS) IMPLANT
SHEATH GLIDE SLENDER 4/5FR (SHEATH) IMPLANT
SHEATH PROBE COVER 6X72 (BAG) IMPLANT
WIRE HI TORQ VERSACORE-J 145CM (WIRE) IMPLANT

## 2023-01-19 NOTE — Anesthesia Preprocedure Evaluation (Addendum)
Anesthesia Evaluation  Patient identified by MRN, date of birth, ID band Patient awake    Reviewed: Allergy & Precautions, H&P , NPO status , Patient's Chart, lab work & pertinent test results  Airway Mallampati: II  TM Distance: >3 FB Neck ROM: Full    Dental no notable dental hx. (+) Teeth Intact, Dental Advisory Given, Missing   Pulmonary neg pulmonary ROS, sleep apnea    Pulmonary exam normal breath sounds clear to auscultation       Cardiovascular Exercise Tolerance: Good negative cardio ROS Normal cardiovascular exam+ Valvular Problems/Murmurs AI  Rhythm:Regular Rate:Normal  ECHO 6/24 . False tendon in LV apex of no clinical significance. Left ventricular  ejection fraction, by estimation, is 55 to 60%. The left ventricle has  normal function. The left ventricle has no regional wall motion  abnormalities. There is mild concentric left  ventricular hypertrophy. Left ventricular diastolic parameters are  consistent with Grade I diastolic dysfunction (impaired relaxation). The  average left ventricular global longitudinal strain is -21.4 %. The global  longitudinal strain is normal.   2. Right ventricular systolic function is normal. The right ventricular  size is normal. Tricuspid regurgitation signal is inadequate for assessing  PA pressure.   3. The mitral valve is normal in structure. Trivial mitral valve  regurgitation. No evidence of mitral stenosis.   4. The aortic valve is tricuspid. Aortic valve regurgitation is moderate  visually and mild by PHT. The AI is eccentric. Aortic valve  sclerosis/calcification is present, without any evidence of aortic  stenosis.   5. Pulmonic valve regurgitation is moderate.   6. Aortic dilatation noted. There is moderate dilatation of the ascending  aorta, measuring 44 mm.   7. The inferior vena cava is normal in size with greater than 50%  respiratory variability, suggesting right  atrial pressure of 3 mmHg.   8. 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 eccentric  AI jet seen in images #34 and 67. Recommend TEE with agitated saline  contrast injection.     Neuro/Psych  Neuromuscular disease negative neurological ROS  negative psych ROS   GI/Hepatic negative GI ROS, Neg liver ROS,GERD  Controlled,,  Endo/Other  negative endocrine ROS    Renal/GU negative Renal ROS  negative genitourinary   Musculoskeletal negative musculoskeletal ROS (+)    Abdominal   Peds negative pediatric ROS (+)  Hematology negative hematology ROS (+) Blood dyscrasia, anemia   Anesthesia Other Findings   Reproductive/Obstetrics negative OB ROS                             Anesthesia Physical Anesthesia Plan  ASA: 3  Anesthesia Plan: MAC   Post-op Pain Management: Minimal or no pain anticipated   Induction: Intravenous  PONV Risk Score and Plan: 3 and TIVA  Airway Management Planned: Mask and Natural Airway  Additional Equipment: None  Intra-op Plan:   Post-operative Plan:   Informed Consent: I have reviewed the patients History and Physical, chart, labs and discussed the procedure including the risks, benefits and alternatives for the proposed anesthesia with the patient or authorized representative who has indicated his/her understanding and acceptance.       Plan Discussed with: Anesthesiologist and CRNA  Anesthesia Plan Comments:        Anesthesia Quick Evaluation

## 2023-01-19 NOTE — Progress Notes (Signed)
Dressing removed from left brachial vascular site and bandaid applied to site. Level O, faint bruise at site.

## 2023-01-19 NOTE — Interval H&P Note (Signed)
History and Physical Interval Note:  01/19/2023 9:48 AM  Deanna [name]  has presented today for surgery, with the diagnosis of aortic insufficency - chest pain.  The various methods of treatment have been discussed with the patient and family. After consideration of risks, benefits and other options for treatment, the patient has consented to  Procedure(s): RIGHT/LEFT HEART CATH AND CORONARY ANGIOGRAPHY (N/A) as a surgical intervention.  The patient's history has been reviewed, patient examined, no change in status, stable for surgery.  I have reviewed the patient's chart and labs.  Questions were answered to the patient's satisfaction.    Cath Lab Visit (complete for each Cath Lab visit)  Clinical Evaluation Leading to the Procedure:   ACS: No.  Non-ACS:    Anginal Classification: CCS III  Anti-ischemic medical therapy: No Therapy  Non-Invasive Test Results: No stress test  Prior CABG: No previous CABG        Verne Carrow

## 2023-01-19 NOTE — Transfer of Care (Signed)
Immediate Anesthesia Transfer of Care Note  Patient: Deanna [name]  Procedure(s) Performed: TRANSESOPHAGEAL ECHOCARDIOGRAM  Patient Location: PACU  Anesthesia Type:MAC  Level of Consciousness: sedated  Airway & Oxygen Therapy: Patient connected to nasal cannula oxygen  Post-op Assessment: Report given to RN and Post -op Vital signs reviewed and stable  Post vital signs: Reviewed and stable  Last Vitals:  Vitals Value Taken Time  BP    Temp    Pulse    Resp    SpO2      Last Pain:  Vitals:   01/19/23 0700  TempSrc: Temporal         Complications: No notable events documented.

## 2023-01-19 NOTE — Interval H&P Note (Signed)
History and Physical Interval Note:  01/19/2023 8:15 AM  Deanna [name]  has presented today for surgery, with the diagnosis of AORTIC INSUFICIANCY.  The various methods of treatment have been discussed with the patient and family. After consideration of risks, benefits and other options for treatment, the patient has consented to  Procedure(s): TRANSESOPHAGEAL ECHOCARDIOGRAM (N/A) as a surgical intervention.  The patient's history has been reviewed, patient examined, no change in status, stable for surgery.  I have reviewed the patient's chart and labs.  Questions were answered to the patient's satisfaction.     Charlton Haws

## 2023-01-19 NOTE — Progress Notes (Signed)
TR BAND REMOVAL rt wrist  LOCATION:    radial right radial  DEFLATED PER PROTOCOL:   yes   TIME BAND OFF / DRESSING APPLIED:    1450/gauze and tegaderm  SITE UPON ARRIVAL:    Level  0  SITE AFTER BAND REMOVAL:    Level 0  CIRCULATION SENSATION AND MOVEMENT:    Within Normal Limits : yes; palpable rt radial pulse, good pleth waveform; rt hand and fingers warm and pink, sensation present  COMMENTS:

## 2023-01-19 NOTE — Anesthesia Postprocedure Evaluation (Signed)
Anesthesia Post Note  Patient: Deanna [name]  Procedure(s) Performed: TRANSESOPHAGEAL ECHOCARDIOGRAM     Patient location during evaluation: PACU Anesthesia Type: MAC Level of consciousness: awake and alert Pain management: pain level controlled Vital Signs Assessment: post-procedure vital signs reviewed and stable Respiratory status: spontaneous breathing, nonlabored ventilation, respiratory function stable and patient connected to nasal cannula oxygen Cardiovascular status: stable and blood pressure returned to baseline Postop Assessment: no apparent nausea or vomiting Anesthetic complications: no  No notable events documented.  Last Vitals:  Vitals:   01/19/23 0850 01/19/23 0851  BP: (!) 88/75 (!) 88/75  Pulse: 79 79  Resp: 18 14  Temp:    SpO2: 100% 100%    Last Pain:  Vitals:   01/19/23 0849  TempSrc: Temporal  PainSc: 0-No pain                 Sebert Stollings

## 2023-01-19 NOTE — CV Procedure (Signed)
TEE: Anesthesia: Propofol  Dilated aortic sinus and root 4.2 cm  Moderate appearing AR Quantitative measures not severe See full report Normal MV No effusion No LAA thrombus EF 60%  Normal RV No PFO/ASD  Charlton Haws MD [facility]

## 2023-01-21 LAB — POCT I-STAT 7, (LYTES, BLD GAS, ICA,H+H)
Acid-base deficit: 2 mmol/L (ref 0.0–2.0)
Bicarbonate: 22.7 mmol/L (ref 20.0–28.0)
Calcium, Ion: 1.13 mmol/L — ABNORMAL LOW (ref 1.15–1.40)
HCT: 33 % — ABNORMAL LOW (ref 36.0–46.0)
Hemoglobin: 11.2 g/dL — ABNORMAL LOW (ref 12.0–15.0)
O2 Saturation: 95 %
Potassium: 3.6 mmol/L (ref 3.5–5.1)
Sodium: 144 mmol/L (ref 135–145)
TCO2: 24 mmol/L (ref 22–32)
pCO2 arterial: 37.7 mmHg (ref 32–48)
pH, Arterial: 7.387 (ref 7.35–7.45)
pO2, Arterial: 75 mmHg — ABNORMAL LOW (ref 83–108)

## 2023-01-22 ENCOUNTER — Encounter (HOSPITAL_COMMUNITY): Payer: Self-pay | Admitting: Cardiovascular Disease

## 2023-01-23 NOTE — Progress Notes (Deleted)
Office Visit Note  Patient: Deanna [name]             Date of Birth: 01-Apr-1961           MRN: [id number]             PCP: [name], MD Referring: [name], MD Visit Date: 01/31/2023   Subjective:  No chief complaint on file.   History of Present Illness: Deanna [name] is a 62 y.o. female here for follow up ***   Previous HPI 10/25/2022 Deanna [name] is a 62 y.o. female here for follow up for lupus on hydroxychloroquine 400 mg daily.  Since last visit she has had a lot of medical events.  She was sick with upper respiratory infection symptoms for about 2 months and developed pleuritic chest pain imaging consistent with pleurisy as well.  Subsequently developed arm weakness and was found to have small stroke.  Did not have specific precipitating cause identified on workup and is doing well without significant residual afterwards.  She just had evaluation for some increase in right knee swelling a bit worse than usual.  Also having some tenderness in her hands around PIP joints without any visible changes.   Previous HPI 04/26/22 Deanna [name] is a 62 y.o. 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  Since her last visit her lateral hip pain is doing better.  She took a Flexeril for short time which was beneficial but noticed a lot of drowsiness or loopy sensation side effects so stopped taking this by now.  Biggest issue currently is her sleep.  Has a lot of fatigue and increase in stiffness associated with fragmented sleep.  She wakes up by 3 AM every morning and cannot fall back asleep or remaining asleep.  She is not taking naps during the day and is trying to follow a consistent sleep schedule without much improvement.  She has known OSA and reports consistent use of her CPAP.   Previous HPI 01/25/22 Deanna [name] is a 62 y.o. female here for follow up For lupus/RA on HCQ 400 mg daily.   Systemic symptoms are doing pretty well but is having increased trouble with pain in her low back and right leg.  Pain going down the lateral side of the leg from the hip down to the knee.  She is not seeing any visible swelling or abnormal appearance in the affected area.  She is not having any falls or severe mobility limitation.     Previous HPI 10/27/2021 Deanna [name] is a 62 y.o. 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 She was doing fairly well off prednisone after last visit. She had a good vacation trip to [place] in March. However afterwards was sick with COVID without serious complications. 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 She has had 3 sinus infections so far this year. She saw ENT late last year due to recurrent sinusitis problems. During these illnesses she has an increase in hand and wrist pains. Most recently developed burning type of pain affecting along her hairline and low back area. This comes and goes no visible changes associated. She noticed some hair loss and thinning at multiple areas during this time. She has tried avoiding any chemical exposures to her hair and scalp.  Previous HPI 04/18/21 Deanna [name] is a 62 y.o. female here for follow up for lupus on HCQ 400 mg PO daily and tapering off prednisone. She has had ongoing upper respiratory problems since our last visit with recurrent ear infection and upper respiratory infections most of the past 3 months. She has taken several rounds of antibiotics with clearance but quick return of symptoms. She was treated with high dose prednisone once after visiting to the emergency department. She has experienced bleeding and ulceration in the nose. She has also suffered spontaneous bruising in one digit for about 2 days at a time with spontaneous resolution. She had past ENT surgery for septum defect but  has not had chronic problems until all this started again after our last visit. She has noticed a small increased in lower extremity swelling with deeper sock band indentations, no discoloration or pain or significant weight change. She had her HCQ eye exam with [facility] okay to continue HCQ but they are planning to follow up in 2-4 weeks.   Previous HPI 01/11/21 Deanna [name] is a 62 y.o. female here for follow up for lupus after starting HCQ 400 mg PO daily and prednisone 5 mg PO daily.  Overall doing well on this but has continued the low-dose steroids.  She has not noticed problems with taking the medication.   09/07/20 Deanna [name] is a 62 y.o. female here for evaluation of positive ANA with fatigue and myalgias. Symptoms are persistent since last year with fatigue, pains in her hands and ankles and stiffness but she has had some joint pains going back for years. She typically notices swelling at the ankles by the end of the day. This is moderately painful but not debilitating. Her hand pain is worst with use during the day and she notices swelling when eating a lot of salt. 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 She does experience raynaud's symptoms with pallor in fingertips without any lesions ulcers or injuries. She denies problems with mouth and eye dryness, lymphadenopathy, chest pain, skin rashes, or photosensitive   [facility] Labs reviewed ANA 1:640 speckled CB-CAP EC4d 16 CB-CAP BC4d 90   Lupus manifestations Fatigue Arthralgia Raynaud's ?Spontaneous bruising Hx of TTP s/p splenectomy (primary vs secondary?)   Labs reviewed ANA pos Scl-70 1.3   No Rheumatology ROS completed.   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   Aortic root dilatation (HCC) 08/07/2016   Ear pain 07/22/2016   Hyperlipemia 12/02/2015   H/O splenectomy 12/02/2015   Hyperglycemia 01/27/2014   Obesity 01/27/2014   Vitamin D deficiency 09/11/2012    Past Medical History:  Diagnosis Date   Allergy    GERD (gastroesophageal reflux disease)    "does not need meds anymore"   Heart murmur    Hyperlipidemia  Lactose intolerance    Lupus (HCC)    Mini stroke    OSA (obstructive sleep apnea)    Positive ANA (antinuclear antibody)    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HCC)     Family History  Problem Relation Age of Onset   Arthritis Mother    Hyperlipidemia Mother    Stroke Mother    Hypertension Mother    Hypertension Father    Lupus Maternal Aunt    Breast cancer Daughter    Lupus Son    Colon cancer Neg Hx    Past Surgical History:  Procedure Laterality Date   ABDOMINAL HYSTERECTOMY  2006   benign tumor, complete   AORTIC ARCH ANGIOGRAPHY N/A 01/19/2023   Procedure: AORTIC ARCH ANGIOGRAPHY;  Surgeon: Kathleene Hazel, MD;  Location: [facility] INVASIVE [facility];  Service: Cardiovascular;  Laterality: N/A;   KNEE ARTHROSCOPY Right    NASAL SEPTUM SURGERY     RIGHT HEART CATH AND CORONARY ANGIOGRAPHY N/A 01/19/2023   Procedure: RIGHT HEART CATH AND CORONARY ANGIOGRAPHY;  Surgeon: Kathleene Hazel, MD;  Location: [facility]AS[facility];  Service: Cardiovascular;  Laterality: N/A;   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   TEE WITHOUT CARDIOVERSION N/A 01/19/2023   Procedure:  TRANSESOPHAGEAL ECHOCARDIOGRAM;  Surgeon: Wendall Stade, MD;  Location: [facility] INVASIVE CV LAB;  Service: Cardiovascular;  Laterality: N/A;   Social History   Social History Narrative   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   Physical Exam   Musculoskeletal Exam: ***  CDAI Exam: CDAI Score: -- Patient Global: --; Provider Global: -- Swollen: --; Tender: -- Joint Exam 01/31/2023   No joint exam has been documented for this visit   There is currently no information documented on the homunculus. Go to the Rheumatology activity and complete the homunculus joint exam.  Investigation: No additional findings.  Imaging: CARDIAC CATHETERIZATION  Result Date: 01/19/2023 No angiographic evidence of CAD Normal right heart pressures Aortic root angiography with moderate to severe aortic insufficiency Unable to obtain LV pressure due to radial artery spasm near the end of the case Recommendations: Consideration of referral to CT surgery to discuss AVR.   PERIPHERAL VASCULAR CATHETERIZATION  Result Date: 01/19/2023 No angiographic evidence of CAD Normal right heart pressures Aortic root angiography with moderate to severe aortic insufficiency Unable to obtain LV pressure due to radial artery spasm near the end of the case Recommendations: Consideration of referral to CT surgery to discuss AVR.   ECHO  TEE  Result Date: 01/19/2023    TRANSESOPHOGEAL ECHO REPORT   Patient Name:   STEVE SCHEIBLE Date of Exam: 01/19/2023 Medical Rec #:  [id number]             Height:       69.0 in Accession #:  [id number]            Weight:       229.9 lb Date of Birth:  [birth year]             BSA:          2.192 m Patient Age:    61 years              BP:           143/82 mmHg Patient Gender: F                     HR:           97 bpm. Exam Location:  Inpatient Procedure: Transesophageal Echo, Color Doppler, Cardiac Doppler and 3D Echo Indications:     Aortic Valve Disease  History:         Patient has prior history of Echocardiogram examinations, most                  recent 10/30/2022. Aortic Valve Disease; Risk                  Factors:Dyslipidemia.  Sonographer:     Milbert Coulter Referring Phys:  [id number] PHILIP J NAHSER Diagnosing Phys: Charlton Haws MD PROCEDURE: After discussion of the risks and benefits of a TEE, an informed consent was obtained from the patient. The transesophogeal probe was passed without difficulty through the esophogus of the patient. Imaged were obtained with the patient in a left lateral decubitus position. Sedation performed by different physician. The patient was monitored while under deep sedation. Anesthestetic sedation was provided intravenously by Anesthesiology: 210.7mg  of Propofol, 100mg  of Lidocaine. Image quality was good. The patient's vital signs; including heart rate, blood pressure, and oxygen saturation; remained stable throughout the procedure. The patient developed no complications during the procedure.  IMPRESSIONS  1. Left ventricular ejection fraction, by estimation, is 60 to 65%. The left ventricle has normal function. The left ventricle has no regional wall motion abnormalities.  2. Right ventricular systolic function is normal. The right ventricular size is normal.  3. Left atrial size was mildly dilated. No left atrial/left atrial appendage thrombus was detected.  4. The mitral  valve is normal in structure. No evidence of mitral valve regurgitation. No evidence of mitral stenosis.  5. AR appears moderate and related to dilated sinus/root. AV tri leaflet AR PISA 0.5 with RF 20% and ERO only .07 cm2. The aortic valve is tricuspid. Aortic valve regurgitation is moderate. No aortic stenosis is present.  6. Aortic dilatation noted. There is moderate dilatation of the aortic root, measuring 39 mm. There is moderate dilatation of the ascending aorta, measuring 42 mm.  7. The inferior vena cava is normal in size with greater than 50% respiratory variability, suggesting right atrial pressure of 3 mmHg. Conclusion(s)/Recommendation(s): Normal biventricular function without evidence of hemodynamically significant valvular heart disease. FINDINGS  Left Ventricle: Left ventricular ejection fraction, by estimation, is 60 to 65%. The left ventricle has normal function. The left ventricle has no regional wall motion abnormalities. The left ventricular internal cavity size was normal in size. There is  no left ventricular hypertrophy. Right Ventricle: The right ventricular size is normal. No increase in right ventricular wall thickness. Right ventricular systolic function is normal. Left Atrium: Left atrial size was mildly dilated. No left atrial/left atrial appendage thrombus was detected. Right Atrium: Right atrial size was normal in size. Pericardium: There is no evidence of pericardial effusion.  Mitral Valve: The mitral valve is normal in structure. No evidence of mitral valve regurgitation. No evidence of mitral valve stenosis. Tricuspid Valve: The tricuspid valve is normal in structure. Tricuspid valve regurgitation is not demonstrated. No evidence of tricuspid stenosis. Aortic Valve: AR appears moderate and related to dilated sinus/root. AV tri leaflet AR PISA 0.5 with RF 20% and ERO only .07 cm2. The aortic valve is tricuspid. Aortic valve regurgitation is moderate. Aortic regurgitation PHT  measures 758 msec. No aortic  stenosis is present. Aortic valve mean gradient measures 4.0 mmHg. Aortic valve peak gradient measures 8.5 mmHg. Aortic valve area, by VTI measures 2.42 cm. Pulmonic Valve: The pulmonic valve was normal in structure. Pulmonic valve regurgitation is not visualized. No evidence of pulmonic stenosis. Aorta: Aortic dilatation noted. There is moderate dilatation of the aortic root, measuring 39 mm. There is moderate dilatation of the ascending aorta, measuring 42 mm. Venous: The inferior vena cava is normal in size with greater than 50% respiratory variability, suggesting right atrial pressure of 3 mmHg. IAS/Shunts: No atrial level shunt detected by color flow Doppler. Additional Comments: Spectral Doppler performed. LEFT VENTRICLE PLAX 2D LVOT diam:     2.30 cm LV SV:         61 LV SV Index:   28 LVOT Area:     4.15 cm  AORTIC VALVE AV Area (Vmax):    2.46 cm AV Area (Vmean):   2.19 cm AV Area (VTI):     2.42 cm AV Vmax:           146.00 cm/s AV Vmean:          94.700 cm/s AV VTI:            0.252 m AV Peak Grad:      8.5 mmHg AV Mean Grad:      4.0 mmHg LVOT Vmax:         86.60 cm/s LVOT Vmean:        50.000 cm/s LVOT VTI:          0.147 m LVOT/AV VTI ratio: 0.58 AI PHT:            758 msec  AORTA Ao Asc diam: 4.20 cm  SHUNTS Systemic VTI:  0.15 m Systemic Diam: 2.30 cm Charlton Haws MD Electronically signed by Charlton Haws MD Signature Date/Time: 01/19/2023/8:58:39 AM    Final    EP STUDY  Result Date: 01/19/2023 See surgical note for result.   Recent Labs: Lab Results  Component Value Date   WBC 6.6 01/16/2023   HGB 11.2 (L) 01/19/2023   PLT 421 01/16/2023   NA 144 01/19/2023   K 3.6 01/19/2023   CL 105 01/16/2023   CO2 24 01/16/2023   GLUCOSE 72 01/16/2023   BUN 16 01/16/2023   CREATININE 0.89 01/16/2023   BILITOT 0.9 12/06/2022   ALKPHOS 70 12/06/2022   AST 16 12/06/2022   ALT 13 12/06/2022   PROT 8.3 12/06/2022   ALBUMIN 4.4 12/06/2022   CALCIUM 9.8  01/16/2023   GFRAA >60 11/27/2019   QFTBGOLDPLUS NEGATIVE 01/11/2021    Speciality Comments: PLQ eye exam 01/10/2023 Normal  @ [facility] f/u 6 months  Procedures:  No procedures performed Allergies: Egg-derived products and Lactose intolerance (gi)   Assessment / Plan:     Visit Diagnoses: No diagnosis found.  ***  Orders: No orders of the defined types were placed in this encounter.  No orders of the defined types were placed in  this encounter.    Follow-Up Instructions: No follow-ups on file.   Metta Clines, RT  Note - This record has been created using [employer].  Chart creation errors have been sought, but may not always  have been located. Such creation errors do not reflect on  the standard of medical care.

## 2023-01-24 ENCOUNTER — Telehealth: Payer: Self-pay | Admitting: Cardiovascular Disease

## 2023-01-24 NOTE — Telephone Encounter (Signed)
Patient is requesting a call back to discuss TEE results.

## 2023-01-25 NOTE — Telephone Encounter (Signed)
Pt aware we are waiting for MD to review result/advise.  He concern is that Dr. Elease Hashimoto put her out of work, but she is to return Monday per the previous note.  However she says that she is not sure what the plan is and that they would determine her plan of care after this testing and she needs to know what to do/plan/out of work longer. She states she is still SOB.

## 2023-01-25 NOTE — Telephone Encounter (Signed)
Patient is calling back for update. Please advise  

## 2023-01-26 ENCOUNTER — Ambulatory Visit: Payer: Managed Care, Other (non HMO) | Attending: Cardiovascular Disease

## 2023-01-26 ENCOUNTER — Telehealth: Payer: Self-pay

## 2023-01-26 DIAGNOSIS — I351 Nonrheumatic aortic (valve) insufficiency: Secondary | ICD-10-CM

## 2023-01-26 DIAGNOSIS — R0609 Other forms of dyspnea: Secondary | ICD-10-CM

## 2023-01-26 DIAGNOSIS — R0602 Shortness of breath: Secondary | ICD-10-CM

## 2023-01-26 MED ORDER — POTASSIUM CHLORIDE ER 10 MEQ PO TBCR
10.0000 meq | EXTENDED_RELEASE_TABLET | Freq: Two times a day (BID) | ORAL | 3 refills | Status: AC
Start: 2023-01-26 — End: ?

## 2023-01-26 NOTE — Telephone Encounter (Signed)
-----   Message from Nurse Corky Crafts sent at 01/26/2023 10:57 AM EDT -----  ----- Message ----- From: Vesta Mixer, MD Sent: 01/25/2023   1:04 PM EDT To: [facility] Triage  Please order a cardiopulmonary stress test ( CPX)  Dr. Gala Romney will read this   Also lets get a BNP  Increase her Kdur to 10 meq po BID  Continue lasix 20 mg a day   Please write a new work note excusing her from work until the Monday following her CPX  . We will need to see what the CPX shows and then come up with another plan   I have called her and discussed that we would be doing some additional testing   Thanks  PN

## 2023-01-26 NOTE — Telephone Encounter (Signed)
Pt called back, she needs to know what her restrictions are and if she can work full time or part time. Please advise, pt would like to go back to work Monday.

## 2023-01-26 NOTE — Telephone Encounter (Signed)
Already spoken with patient today. 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 She states she does a lot of running around and does make her SOB. Work excuse sent to her via MyChart to cover now until 02/05/23.

## 2023-01-26 NOTE — Telephone Encounter (Signed)
BNP entered and scheduled for today 01/26/23. CPX ordered and pt would like it to be done as soon as possible. She is eager to return to work, even if duties need to be limited. Asked for a note excusing her for one week only (agreed on 02/05/23) in hopes that CPX will be completed by that time. Routing to MD to sign CPX attestation form.

## 2023-01-27 LAB — PRO B NATRIURETIC PEPTIDE: NT-Pro BNP: 149 pg/mL (ref 0–287)

## 2023-01-30 ENCOUNTER — Ambulatory Visit (INDEPENDENT_AMBULATORY_CARE_PROVIDER_SITE_OTHER): Payer: Managed Care, Other (non HMO) | Admitting: Internal Medicine

## 2023-01-30 ENCOUNTER — Encounter: Payer: Self-pay | Admitting: Internal Medicine

## 2023-01-30 VITALS — BP 122/82 | HR 70 | Temp 98.4°F | Wt 233.3 lb

## 2023-01-30 DIAGNOSIS — N6322 Unspecified lump in the left breast, upper inner quadrant: Secondary | ICD-10-CM

## 2023-01-30 DIAGNOSIS — Z1231 Encounter for screening mammogram for malignant neoplasm of breast: Secondary | ICD-10-CM

## 2023-01-30 NOTE — Progress Notes (Signed)
Established Patient Office Visit     CC/Reason for Visit: Left breast lump  HPI: Deanna [name] is a 62 y.o. female who is coming in today for the above mentioned reasons.  She has noted for about a month and a small breast lump and is now having some left axillary pain.  Past Medical/Surgical History: Past Medical History:  Diagnosis Date   Allergy    GERD (gastroesophageal reflux disease)    "does not need meds anymore"   Heart murmur    Hyperlipidemia    Lactose intolerance    Lupus (HCC)    Mini stroke    OSA (obstructive sleep apnea)    Positive ANA (antinuclear antibody)    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HCC)     Past Surgical History:  Procedure Laterality Date   ABDOMINAL HYSTERECTOMY  2006   benign tumor, complete   AORTIC ARCH ANGIOGRAPHY N/A 01/19/2023   Procedure: AORTIC ARCH ANGIOGRAPHY;  Surgeon: Kathleene Hazel, MD;  Location: [facility] IN[facility]ASIVE [facility];  Service: Cardiovascular;  Laterality: N/A;   KNEE ARTHROSCOPY Right    NASAL SEPTUM SURGERY     RIGHT HEART CATH AND CORONARY ANGIOGRAPHY N/A 01/19/2023   Procedure: RIGHT HEART CATH AND CORONARY ANGIOGRAPHY;  Surgeon: Kathleene Hazel, MD;  Location: [facility]VAS[facility] CV [facility];  Service: Cardiovascular;  Laterality: N/A;   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   TEE WITHOUT CARDIOVERSION N/A 01/19/2023   Procedure: TRANSESOPHAGEAL ECHOCARDIOGRAM;  Surgeon: Wendall Stade, MD;  Location: [facility] INVASIVE CV LAB;  Service: Cardiovascular;  Laterality: N/A;    Social History:  reports that she has never smoked. She has never used smokeless tobacco. She reports that she does not drink alcohol and does not use drugs.  Allergies: Allergies  Allergen Reactions   Egg-Derived Products Nausea And Vomiting   Lactose Intolerance (Gi) Other (See Comments)    vomiting    Family History:  Family History  Problem Relation Age of Onset   Arthritis Mother    Hyperlipidemia Mother    Stroke  Mother    Hypertension Mother    Hypertension Father    Lupus Maternal Aunt    Breast cancer Daughter    Lupus Son    Colon cancer Neg Hx      Current Outpatient Medications:    Cholecalciferol 50 MCG (2000 UT) TBDP, Take 2,000 Units by mouth daily., Disp: , Rfl:    clopidogrel (PLAVIX) 75 MG tablet, TAKE 1 TABLET BY MOUTH EVERY DAY, Disp: 30 tablet, Rfl: 0   Ferrous Sulfate (IRON PO), Take 65 mg by mouth daily., Disp: , Rfl:    fexofenadine (ALLEGRA) 180 MG tablet, TAKE 1 TABLET BY MOUTH EVERY DAY (Patient taking differently: Take 180 mg by mouth as needed for allergies.), Disp: 30 tablet, Rfl: 2   furosemide (LASIX) 20 MG tablet, Take 1 tablet (20 mg total) by mouth daily., Disp: 90 tablet, Rfl: 2   hydroxychloroquine (PLAQUENIL) 200 MG tablet, TAKE 2 TABLETS BY MOUTH EVERY DAY, Disp: 180 tablet, Rfl: 0   naproxen sodium (ALEVE) 220 MG tablet, Take 440 mg by mouth daily as needed (Headache)., Disp: , Rfl:    nitroGLYCERIN (NITROSTAT) 0.4 MG SL tablet, Place 1 tablet (0.4 mg total) under the tongue every 5 (five) minutes as needed for chest pain., Disp: 25 tablet, Rfl: 3   potassium chloride (KLOR-CON M) 10 MEQ tablet, Take 1 tablet (10 mEq total) by mouth daily. (Patient taking differently: Take  10 mEq by mouth 2 (two) times daily.), Disp: 90 tablet, Rfl: 2   potassium chloride (KLOR-CON) 10 MEQ tablet, Take 1 tablet (10 mEq total) by mouth 2 (two) times daily., Disp: 180 tablet, Rfl: 3  Review of Systems:  Negative unless indicated in HPI.   Physical Exam: Vitals:   01/30/23 0715  BP: 122/82  Pulse: 70  Temp: 98.4 F (36.9 C)  TempSrc: Oral  SpO2: 97%  Weight: 233 lb 4.8 oz (105.8 kg)    Body mass index is 34.45 kg/m.   Physical Exam Chest:  Breasts:    Right: Normal.     Comments: Small oval mass of the left breast at about the 10 o'clock position above the nipple.     Impression and Plan:  Breast lump on left side at 10 o'clock position  Encounter for  screening mammogram for malignant neoplasm of breast -     Digital Screening Mammogram, Left and Right; Future -     MM Digital Diagnostic Unilat L; Future -     US BREAST COMPLETE UNI LEFT INC AXILLA; Future   -Sent for screening mammogram as she is due, will include diagnostic of the left breast and left axilla given small lump identified today.  Time spent:30 minutes reviewing chart, interviewing and examining patient and formulating plan of care.     Chaya Jan, MD [facility]

## 2023-01-31 ENCOUNTER — Ambulatory Visit: Payer: Managed Care, Other (non HMO) | Attending: Internal Medicine | Admitting: Internal Medicine

## 2023-01-31 ENCOUNTER — Encounter: Payer: Self-pay | Admitting: Internal Medicine

## 2023-01-31 VITALS — BP 108/70 | HR 77 | Resp 12 | Ht 69.0 in | Wt 232.4 lb

## 2023-01-31 DIAGNOSIS — M67442 Ganglion, left hand: Secondary | ICD-10-CM

## 2023-01-31 DIAGNOSIS — I358 Other nonrheumatic aortic valve disorders: Secondary | ICD-10-CM

## 2023-01-31 DIAGNOSIS — I7300 Raynaud's syndrome without gangrene: Secondary | ICD-10-CM

## 2023-01-31 DIAGNOSIS — Z79899 Other long term (current) drug therapy: Secondary | ICD-10-CM | POA: Diagnosis not present

## 2023-01-31 DIAGNOSIS — M359 Systemic involvement of connective tissue, unspecified: Secondary | ICD-10-CM

## 2023-02-01 LAB — C3 AND C4
C3 Complement: 150 mg/dL (ref 83–193)
C4 Complement: 27 mg/dL (ref 15–57)

## 2023-02-01 LAB — C-REACTIVE PROTEIN: CRP: 4.7 mg/L (ref <8.0)

## 2023-02-01 LAB — SEDIMENTATION RATE: Sed Rate: 29 mm/h (ref 0–30)

## 2023-02-02 ENCOUNTER — Telehealth: Payer: Self-pay | Admitting: Cardiovascular Disease

## 2023-02-02 NOTE — Telephone Encounter (Signed)
Pt is calling back to speak with the nurse to tell her how she has been doing on the medication she was given. Please advise

## 2023-02-05 ENCOUNTER — Other Ambulatory Visit: Payer: Self-pay | Admitting: Internal Medicine

## 2023-02-05 ENCOUNTER — Ambulatory Visit
Admission: RE | Admit: 2023-02-05 | Discharge: 2023-02-05 | Disposition: A | Discharge status: Home / Self Care | Payer: Managed Care, Other (non HMO) | Source: Ambulatory Visit | Attending: Internal Medicine | Admitting: Internal Medicine

## 2023-02-05 DIAGNOSIS — Z1231 Encounter for screening mammogram for malignant neoplasm of breast: Secondary | ICD-10-CM

## 2023-02-05 DIAGNOSIS — N6322 Unspecified lump in the left breast, upper inner quadrant: Secondary | ICD-10-CM

## 2023-02-05 NOTE — Telephone Encounter (Signed)
Patient is calling back for update. Requesting call back.

## 2023-02-05 NOTE — Telephone Encounter (Signed)
Letter has been sent through Mychart. Will have Dr. Elease Hashimoto respond about continuing medication lasix and potassium. Informed patient that most likely she will continue since a 90 day supply was sent in.

## 2023-02-05 NOTE — Telephone Encounter (Signed)
Patient stated since Friday she has had no problem with SOB and/or chest pain. Patient stated Lasix and Potassium are working great. Patient wants to know if she needs to continue taking these two medications. Also patient needs a note to return to work tomorrow with no restrictions. Will send message to Dr. Elease Hashimoto for advisement and okay to write note for work.

## 2023-02-07 NOTE — Telephone Encounter (Signed)
Left message for patient to call back  

## 2023-02-09 DIAGNOSIS — Z1231 Encounter for screening mammogram for malignant neoplasm of breast: Secondary | ICD-10-CM

## 2023-02-12 NOTE — Telephone Encounter (Signed)
Left detailed message per DPR informing her to stay on Furosemide and Potassium and to call back if any questions.  Nahser, Deanna Ping, MD  Ethelda Chick, RN6 days ago    Pt may return to work tomorrow.  I'm glad she is better on Lasix and potassium. Continue lasix and potassium  PN

## 2023-02-16 ENCOUNTER — Telehealth: Payer: Self-pay | Admitting: Cardiovascular Disease

## 2023-02-16 NOTE — Telephone Encounter (Signed)
Patient states she was returning call. Please advise  

## 2023-02-19 ENCOUNTER — Telehealth: Payer: Self-pay | Admitting: Nurse Practitioner

## 2023-02-19 NOTE — Telephone Encounter (Signed)
Pt stated orders had been placed for testing (looks like on 01/26/23 for CPX) and she had not heard from anyone to schedule that. Let her know I would reach out to see when we can get her scheduled.  Her second question, she has a appt on 03/27/23 and has been called into jury duty. Eligha Bridegroom, NP next appt is not until after the first of the year. Told her I would route that to [facility] to see if we are able to get her in before the first of the year.

## 2023-02-19 NOTE — Telephone Encounter (Signed)
patient is requesting to speak with Eligha Bridegroom or nurse

## 2023-02-19 NOTE — Telephone Encounter (Signed)
Completed in separate encounter

## 2023-02-20 NOTE — Telephone Encounter (Signed)
Please notify her that Dr. Elease Hashimoto has recommended that she have the CPX prior to seeing any of [place] in the office. She currently has an appointment on 10/29 with Tereso Newcomer. Would recommend she reschedule this if her CPX is not completed prior.

## 2023-02-22 ENCOUNTER — Ambulatory Visit: Payer: Managed Care, Other (non HMO) | Admitting: Nurse Practitioner

## 2023-02-22 NOTE — Progress Notes (Signed)
Cardiology Office Note:  .   Date:  02/23/2023  ID:  [name], DOB [birth year], MRN [id number] PCP: [name], MD  [facility] Providers Cardiologist:  None {  History of Present Illness: .   Lacheryl Niesen is a 62 y.o. female with a past medical history of HTN, aortic sclerosis, history of stroke, and SLE here for follow-up appointment.  Patient has a history of DOE with walking and some chest pressure.  Chest pressure is associated with DOE.  This is new and has been going on a month or so (as of 01/11/2023) he.  Has been walking for years.  The DOE and chest pressure last for several minutes.  Usually does 1 mile at the gym.  She is on Plavix for previous stroke.  Echocardiogram October 30, 2022 shows normal LVEF 55 to 60%.  Grade 1 DD.  Trivial mitral regurgitation and moderate aortic insufficiency with calcification of the aortic valve.  No evidence of aortic stenosis.  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  Possible perimembranous VSD (clinically not likely).  She has a strong family history of strokes.  Today, she tells me she is short of breath and not feeling good.  She has weakness in her left arm and numbness in her left fingertips.  She does have a diagnosis of lupus.  Sometimes she is not sure if her symptoms are from her heart or from her lupus.  She also follows with a pulmonologist Dr. Craige Cotta.  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  After discussion with Dr. Elease Hashimoto who also reviewed the TEE with Dr. Gala Romney it is unlikely that her symptoms are related to her aortic valve.  We did discuss potential surgery down the road and I recommended Dr. Laneta Simmers if she does need a repair.  She had a lung infection back in January which required antibiotics.  She had a CPX ordered about a month ago but it was never scheduled.  Luckily, we were able to get this scheduled for her  today.  Reports no shortness of breath nor dyspnea on exertion. Reports no chest pain, pressure, or tightness. No edema, orthopnea, PND. Reports no palpitations.    ROS: Pertinent ROS in HPI  Studies Reviewed: .       Echocardiogram (TEE)  01/19/2023 IMPRESSIONS     1. Left ventricular ejection fraction, by estimation, is 60 to 65%. The  left ventricle has normal function. The left ventricle has no regional  wall motion abnormalities.   2. Right ventricular systolic function is normal. The right ventricular  size is normal.   3. Left atrial size was mildly dilated. No left atrial/left atrial  appendage thrombus was detected.   4. The mitral valve is normal in structure. No evidence of mitral valve  regurgitation. No evidence of mitral stenosis.   5. AR appears moderate and related to dilated sinus/root. AV tri leaflet  AR PISA 0.5 with RF 20% and ERO only .07 cm2. The aortic valve is  tricuspid. Aortic valve regurgitation is moderate. No aortic stenosis is  present.   6. Aortic dilatation noted. There is moderate dilatation of the aortic  root, measuring 39 mm. There is moderate dilatation of the ascending  aorta, measuring 42 mm.   7. The inferior vena cava is normal in size with greater than 50%  respiratory variability, suggesting right atrial pressure of 3 mmHg.  Conclusion(s)/Recommendation(s): Normal biventricular function without  evidence of hemodynamically significant valvular heart disease.   FINDINGS   Left Ventricle: Left ventricular ejection fraction, by estimation, is 60  to 65%. The left ventricle has normal function. The left ventricle has no  regional wall motion abnormalities. The left ventricular internal cavity  size was normal in size. There is   no left ventricular hypertrophy.   Right Ventricle: The right ventricular size is normal. No increase in  right ventricular wall thickness. Right ventricular systolic function is  normal.   Left Atrium: Left  atrial size was mildly dilated. No left atrial/left  atrial appendage thrombus was detected.   Right Atrium: Right atrial size was normal in size.   Pericardium: There is no evidence of pericardial effusion.   Mitral Valve: The mitral valve is normal in structure. No evidence of  mitral valve regurgitation. No evidence of mitral valve stenosis.   Tricuspid Valve: The tricuspid valve is normal in structure. Tricuspid  valve regurgitation is not demonstrated. No evidence of tricuspid  stenosis.   Aortic Valve: AR appears moderate and related to dilated sinus/root. AV  tri leaflet AR PISA 0.5 with RF 20% and ERO only .07 cm2. The aortic valve  is tricuspid. Aortic valve regurgitation is moderate. Aortic regurgitation  PHT measures 758 msec. No aortic   stenosis is present. Aortic valve mean gradient measures 4.0 mmHg. Aortic  valve peak gradient measures 8.5 mmHg. Aortic valve area, by VTI measures  2.42 cm.   Pulmonic Valve: The pulmonic valve was normal in structure. Pulmonic valve  regurgitation is not visualized. No evidence of pulmonic stenosis.   Aorta: Aortic dilatation noted. There is moderate dilatation of the aortic  root, measuring 39 mm. There is moderate dilatation of the ascending  aorta, measuring 42 mm.   Venous: The inferior vena cava is normal in size with greater than 50%  respiratory variability, suggesting right atrial pressure of 3 mmHg.   IAS/Shunts: No atrial level shunt detected by color flow Doppler.   Additional Comments: Spectral Doppler performed.       Physical Exam:   VS:  BP 134/74   Pulse 64   Ht 5\' 9"  (1.753 m)   Wt 232 lb 3.2 oz (105.3 kg)   SpO2 97%   BMI 34.29 kg/m    Wt Readings from Last 3 Encounters:  02/23/23 232 lb 3.2 oz (105.3 kg)  01/31/23 232 lb 6.4 oz (105.4 kg)  01/30/23 233 lb 4.8 oz (105.8 kg)    GEN: Well nourished, well developed in no acute distress NECK: No JVD; No carotid bruits CARDIAC: RRR, no murmurs, rubs,  gallops RESPIRATORY:  Clear to auscultation without rales, wheezing or rhonchi  ABDOMEN: Soft, non-tender, non-distended EXTREMITIES:  No edema; No deformity   ASSESSMENT AND PLAN: .   1.  Aortic insufficiency/shortness of breath with exertion -TEE reviewed with Dr. Elease Hashimoto -The thought is her shortness of breath is likely due to a noncardiac issue. -We recommended follow-up with pulmonary for PFTs -Continue monitoring with serial echocardiograms  2.  Hypertension -Blood pressure well-controlled today 134/74 -Discussed close surveillance due to her ascending aortic enlargement -Discussed low-sodium, heart healthy diet -Please keep track of blood pressure at home an hour to 2 hours after medications  3.  History of stroke -No residual side effects -Continue Plavix 75 mg daily  4. Lupus -Symptoms are currently well-controlled on current medication regimen -Continue with rheumatology -She gets labs every few months for inflammatory markers  Dispo: She can follow-up in 4 to 5 months with Dr. Elease Hashimoto  Signed, Sharlene Dory, PA-C

## 2023-02-23 ENCOUNTER — Other Ambulatory Visit: Payer: Self-pay | Admitting: Neurology

## 2023-02-23 ENCOUNTER — Ambulatory Visit: Payer: Managed Care, Other (non HMO) | Attending: Nurse Practitioner | Admitting: Physician Assistant

## 2023-02-23 ENCOUNTER — Encounter: Payer: Self-pay | Admitting: Physician Assistant

## 2023-02-23 VITALS — BP 134/74 | HR 64 | Ht 69.0 in | Wt 232.2 lb

## 2023-02-23 DIAGNOSIS — R202 Paresthesia of skin: Secondary | ICD-10-CM

## 2023-02-23 DIAGNOSIS — R0609 Other forms of dyspnea: Secondary | ICD-10-CM

## 2023-02-23 DIAGNOSIS — I6339 Cerebral infarction due to thrombosis of other cerebral artery: Secondary | ICD-10-CM

## 2023-02-23 DIAGNOSIS — R079 Chest pain, unspecified: Secondary | ICD-10-CM

## 2023-02-23 DIAGNOSIS — Z8673 Personal history of transient ischemic attack (TIA), and cerebral infarction without residual deficits: Secondary | ICD-10-CM

## 2023-02-23 DIAGNOSIS — I351 Nonrheumatic aortic (valve) insufficiency: Secondary | ICD-10-CM

## 2023-02-23 DIAGNOSIS — R0602 Shortness of breath: Secondary | ICD-10-CM | POA: Diagnosis not present

## 2023-02-23 NOTE — Patient Instructions (Signed)
Medication Instructions:  Your physician recommends that you continue on your current medications as directed. Please refer to the Current Medication list given to you today.  *If you need a refill on your cardiac medications before your next appointment, please call your pharmacy*  Lab Work: None ordered If you have labs (blood work) drawn today and your tests are completely normal, you will receive your results only by: MyChart Message (if you have MyChart) OR A paper copy in the mail If you have any lab test that is abnormal or we need to change your treatment, we will call you to review the results.  Testing/Procedures: Schedule CPX as previously ordered   Follow-Up: At [facility], you and your health needs are our priority.  As part of our continuing mission to provide you with exceptional heart care, we have created designated Provider Care Teams.  These Care Teams include your primary Cardiologist (physician) and Advanced Practice Providers (APPs -  Physician Assistants and Nurse Practitioners) who all work together to provide you with the care you need, when you need it.  Your next appointment:   4-5 month(s)  Provider:   Dr Elease Hashimoto  Other Instructions Check your blood pressure daily, 1 hr after morning medications for 2 weeks, keep a log and send [place] the readings through mychart at the end of the 2 weeks.   Low-Sodium Eating Plan Salt (sodium) helps you keep a healthy balance of fluids in your body. Too much sodium can raise your blood pressure. It can also cause fluid and waste to be held in your body. Your health care provider or dietitian may recommend a low-sodium eating plan if you have high blood pressure (hypertension), kidney disease, liver disease, or heart failure. Eating less sodium can help lower your blood pressure and reduce swelling. It can also protect your heart, liver, and kidneys. What are tips for following this plan? Reading food labels  Check  food labels for the amount of sodium per serving. If you eat more than one serving, you must multiply the listed amount by the number of servings. Choose foods with less than 140 milligrams (mg) of sodium per serving. Avoid foods with 300 mg of sodium or more per serving. Always check how much sodium is in a product, even if the label says "unsalted" or "no salt added." Shopping  Buy products labeled as "low-sodium" or "no salt added." Buy fresh foods. Avoid canned foods and pre-made or frozen meals. Avoid canned, cured, or processed meats. Buy breads that have less than 80 mg of sodium per slice. Cooking  Eat more home-cooked food. Try to eat less restaurant, buffet, and fast food. Try not to add salt when you cook. Use salt-free seasonings or herbs instead of table salt or sea salt. Check with your provider or pharmacist before using salt substitutes. Cook with plant-based oils, such as canola, sunflower, or olive oil. Meal planning When eating at a restaurant, ask if your food can be made with less salt or no salt. Avoid dishes labeled as brined, pickled, cured, or smoked. Avoid dishes made with soy sauce, miso, or teriyaki sauce. Avoid foods that have monosodium glutamate (MSG) in them. MSG may be added to some restaurant food, sauces, soups, bouillon, and canned foods. Make meals that can be grilled, baked, poached, roasted, or steamed. These are often made with less sodium. General information Try to limit your sodium intake to 1,500-2,300 mg each day, or the amount told by your provider. What foods  should I eat? Fruits Fresh, frozen, or canned fruit. Fruit juice. Vegetables Fresh or frozen vegetables. "No salt added" canned vegetables. "No salt added" tomato sauce and paste. Low-sodium or reduced-sodium tomato and vegetable juice. Grains Low-sodium cereals, such as oats, puffed wheat and rice, and shredded wheat. Low-sodium crackers. Unsalted rice. Unsalted pasta. Low-sodium bread.  Whole grain breads and whole grain pasta. Meats and other proteins Fresh or frozen meat, poultry, seafood, and fish. These should have no added salt. Low-sodium canned tuna and salmon. Unsalted nuts. Dried peas, beans, and lentils without added salt. Unsalted canned beans. Eggs. Unsalted nut butters. Dairy Milk. Soy milk. Cheese that is naturally low in sodium, such as ricotta cheese, fresh mozzarella, or Swiss cheese. Low-sodium or reduced-sodium cheese. Cream cheese. Yogurt. Seasonings and condiments Fresh and dried herbs and spices. Salt-free seasonings. Low-sodium mustard and ketchup. Sodium-free salad dressing. Sodium-free light mayonnaise. Fresh or refrigerated horseradish. Lemon juice. Vinegar. Other foods Homemade, reduced-sodium, or low-sodium soups. Unsalted popcorn and pretzels. Low-salt or salt-free chips. The items listed above may not be all the foods and drinks you can have. Talk to a dietitian to learn more. What foods should I avoid? Vegetables Sauerkraut, pickled vegetables, and relishes. Olives. [place] fries. Onion rings. Regular canned vegetables, except low-sodium or reduced-sodium items. Regular canned tomato sauce and paste. Regular tomato and vegetable juice. Frozen vegetables in sauces. Grains Instant hot cereals. Bread stuffing, pancake, and biscuit mixes. Croutons. Seasoned rice or pasta mixes. Noodle soup cups. Boxed or frozen macaroni and cheese. Regular salted crackers. Self-rising flour. Meats and other proteins Meat or fish that is salted, canned, smoked, spiced, or pickled. Precooked or cured meat, such as sausages or meat loaves. Tomasa Blase. Ham. Pepperoni. Hot dogs. Corned beef. Chipped beef. Salt pork. Jerky. Pickled herring, anchovies, and sardines. Regular canned tuna. Salted nuts. Dairy Processed cheese and cheese spreads. Hard cheeses. Cheese curds. Blue cheese. Feta cheese. String cheese. Regular cottage cheese. Buttermilk. Canned milk. Fats and oils Salted  butter. Regular margarine. Ghee. Bacon fat. Seasonings and condiments Onion salt, garlic salt, seasoned salt, table salt, and sea salt. Canned and packaged gravies. Worcestershire sauce. Tartar sauce. Barbecue sauce. Teriyaki sauce. Soy sauce, including reduced-sodium soy sauce. Steak sauce. Fish sauce. Oyster sauce. Cocktail sauce. Horseradish that you find on the shelf. Regular ketchup and mustard. Meat flavorings and tenderizers. Bouillon cubes. Hot sauce. Pre-made or packaged marinades. Pre-made or packaged taco seasonings. Relishes. Regular salad dressings. Salsa. Other foods Salted popcorn and pretzels. Corn chips and puffs. Potato and tortilla chips. Canned or dried soups. Pizza. Frozen entrees and pot pies. The items listed above may not be all the foods and drinks you should avoid. Talk to a dietitian to learn more. This information is not intended to replace advice given to you by your health care provider. Make sure you discuss any questions you have with your health care provider. Document Revised: 05/18/2022 Document Reviewed: 05/18/2022 Elsevier Patient Education  2024 [employer] Heart-Healthy Eating Plan Many factors influence your heart health, including eating and exercise habits. Heart health is also called coronary health. Coronary risk increases with abnormal blood fat (lipid) levels. A heart-healthy eating plan includes limiting unhealthy fats, increasing healthy fats, limiting salt (sodium) intake, and making other diet and lifestyle changes. What is my plan? Your health care provider may recommend that: You limit your fat intake to _________% or less of your total calories each day. You limit your saturated fat intake to _________% or less of your total calories each day.  You limit the amount of cholesterol in your diet to less than _________ mg per day. You limit the amount of sodium in your diet to less than _________ mg per day. What are tips for following this  plan? Cooking Cook foods using methods other than frying. Baking, boiling, grilling, and broiling are all good options. Other ways to reduce fat include: Removing the skin from poultry. Removing all visible fats from meats. Steaming vegetables in water or broth. Meal planning  At meals, imagine dividing your plate into fourths: Fill one-half of your plate with vegetables and green salads. Fill one-fourth of your plate with whole grains. Fill one-fourth of your plate with lean protein foods. Eat 2-4 cups of vegetables per day. One cup of vegetables equals 1 cup (91 g) broccoli or cauliflower florets, 2 medium carrots, 1 large bell pepper, 1 large sweet potato, 1 large tomato, 1 medium white potato, 2 cups (150 g) raw leafy greens. Eat 1-2 cups of fruit per day. One cup of fruit equals 1 small apple, 1 large banana, 1 cup (237 g) mixed fruit, 1 large orange,  cup (82 g) dried fruit, 1 cup (240 mL) 100% fruit juice. Eat more foods that contain soluble fiber. Examples include apples, broccoli, carrots, beans, peas, and barley. Aim to get 25-30 g of fiber per day. Increase your consumption of legumes, nuts, and seeds to 4-5 servings per week. One serving of dried beans or legumes equals  cup (90 g) cooked, 1 serving of nuts is  oz (12 almonds, 24 pistachios, or 7 walnut halves), and 1 serving of seeds equals  oz (8 g). Fats Choose healthy fats more often. Choose monounsaturated and polyunsaturated fats, such as olive and canola oils, avocado oil, flaxseeds, walnuts, almonds, and seeds. Eat more omega-3 fats. Choose salmon, mackerel, sardines, tuna, flaxseed oil, and ground flaxseeds. Aim to eat fish at least 2 times each week. Check food labels carefully to identify foods with trans fats or high amounts of saturated fat. Limit saturated fats. These are found in animal products, such as meats, butter, and cream. Plant sources of saturated fats include palm oil, palm kernel oil, and coconut  oil. Avoid foods with partially hydrogenated oils in them. These contain trans fats. Examples are stick margarine, some tub margarines, cookies, crackers, and other baked goods. Avoid fried foods. General information Eat more home-cooked food and less restaurant, buffet, and fast food. Limit or avoid alcohol. Limit foods that are high in added sugar and simple starches such as foods made using white refined flour (white breads, pastries, sweets). Lose weight if you are overweight. Losing just 5-10% of your body weight can help your overall health and prevent diseases such as diabetes and heart disease. Monitor your sodium intake, especially if you have high blood pressure. Talk with your health care provider about your sodium intake. Try to incorporate more vegetarian meals weekly. What foods should I eat? Fruits All fresh, canned (in natural juice), or frozen fruits. Vegetables Fresh or frozen vegetables (raw, steamed, roasted, or grilled). Green salads. Grains Most grains. Choose whole wheat and whole grains most of the time. Rice and pasta, including brown rice and pastas made with whole wheat. Meats and other proteins Lean, well-trimmed beef, veal, pork, and lamb. Chicken and [place] without skin. All fish and shellfish. Wild duck, rabbit, pheasant, and venison. Egg whites or low-cholesterol egg substitutes. Dried beans, peas, lentils, and tofu. Seeds and most nuts. Dairy Low-fat or nonfat cheeses, including ricotta and mozzarella. Skim  or 1% milk (liquid, powdered, or evaporated). Buttermilk made with low-fat milk. Nonfat or low-fat yogurt. Fats and oils Non-hydrogenated (trans-free) margarines. Vegetable oils, including soybean, sesame, sunflower, olive, avocado, peanut, safflower, corn, canola, and cottonseed. Salad dressings or mayonnaise made with a vegetable oil. Beverages Water (mineral or sparkling). Coffee and tea. Unsweetened ice tea. Diet beverages. Sweets and  desserts Sherbet, gelatin, and fruit ice. Small amounts of dark chocolate. Limit all sweets and desserts. Seasonings and condiments All seasonings and condiments. The items listed above may not be a complete list of foods and beverages you can eat. Contact a dietitian for more options. What foods should I avoid? Fruits Canned fruit in heavy syrup. Fruit in cream or butter sauce. Fried fruit. Limit coconut. Vegetables Vegetables cooked in cheese, cream, or butter sauce. Fried vegetables. Grains Breads made with saturated or trans fats, oils, or whole milk. Croissants. Sweet rolls. Donuts. High-fat crackers, such as cheese crackers and chips. Meats and other proteins Fatty meats, such as hot dogs, ribs, sausage, bacon, rib-eye roast or steak. High-fat deli meats, such as salami and bologna. Caviar. Domestic duck and goose. Organ meats, such as liver. Dairy Cream, sour cream, cream cheese, and creamed cottage cheese. Whole-milk cheeses. Whole or 2% milk (liquid, evaporated, or condensed). Whole buttermilk. Cream sauce or high-fat cheese sauce. Whole-milk yogurt. Fats and oils Meat fat, or shortening. Cocoa butter, hydrogenated oils, palm oil, coconut oil, palm kernel oil. Solid fats and shortenings, including bacon fat, salt pork, lard, and butter. Nondairy cream substitutes. Salad dressings with cheese or sour cream. Beverages Regular sodas and any drinks with added sugar. Sweets and desserts Frosting. Pudding. Cookies. Cakes. Pies. Milk chocolate or white chocolate. Buttered syrups. Full-fat ice cream or ice cream drinks. The items listed above may not be a complete list of foods and beverages to avoid. Contact a dietitian for more information. Summary Heart-healthy meal planning includes limiting unhealthy fats, increasing healthy fats, limiting salt (sodium) intake and making other diet and lifestyle changes. Lose weight if you are overweight. Losing just 5-10% of your body weight can help  your overall health and prevent diseases such as diabetes and heart disease. Focus on eating a balance of foods, including fruits and vegetables, low-fat or nonfat dairy, lean protein, nuts and legumes, whole grains, and heart-healthy oils and fats. This information is not intended to replace advice given to you by your health care provider. Make sure you discuss any questions you have with your health care provider. Document Revised: 06/06/2021 Document Reviewed: 06/06/2021 Elsevier Patient Education  2024 [employer].

## 2023-02-27 ENCOUNTER — Other Ambulatory Visit (HOSPITAL_COMMUNITY): Payer: Self-pay | Admitting: *Deleted

## 2023-02-27 ENCOUNTER — Ambulatory Visit (HOSPITAL_COMMUNITY): Payer: Managed Care, Other (non HMO) | Attending: Physician Assistant

## 2023-02-27 DIAGNOSIS — R0600 Dyspnea, unspecified: Secondary | ICD-10-CM | POA: Diagnosis not present

## 2023-02-27 DIAGNOSIS — R0609 Other forms of dyspnea: Secondary | ICD-10-CM | POA: Diagnosis present

## 2023-02-27 DIAGNOSIS — R0602 Shortness of breath: Secondary | ICD-10-CM

## 2023-02-27 DIAGNOSIS — I351 Nonrheumatic aortic (valve) insufficiency: Secondary | ICD-10-CM

## 2023-03-01 ENCOUNTER — Ambulatory Visit (INDEPENDENT_AMBULATORY_CARE_PROVIDER_SITE_OTHER): Payer: Managed Care, Other (non HMO) | Admitting: Adult Health

## 2023-03-01 ENCOUNTER — Encounter: Payer: Self-pay | Admitting: Adult Health

## 2023-03-01 VITALS — BP 118/70 | HR 74 | Temp 98.2°F | Ht 69.0 in | Wt 230.8 lb

## 2023-03-01 DIAGNOSIS — J45909 Unspecified asthma, uncomplicated: Secondary | ICD-10-CM | POA: Insufficient documentation

## 2023-03-01 DIAGNOSIS — G4733 Obstructive sleep apnea (adult) (pediatric): Secondary | ICD-10-CM | POA: Insufficient documentation

## 2023-03-01 DIAGNOSIS — R059 Cough, unspecified: Secondary | ICD-10-CM | POA: Insufficient documentation

## 2023-03-01 DIAGNOSIS — J4520 Mild intermittent asthma, uncomplicated: Secondary | ICD-10-CM

## 2023-03-01 DIAGNOSIS — J310 Chronic rhinitis: Secondary | ICD-10-CM | POA: Insufficient documentation

## 2023-03-01 MED ORDER — AZELASTINE-FLUTICASONE 137-50 MCG/ACT NA SUSP: 2.0000 | Freq: Every day | NASAL | 5 refills | Status: AC

## 2023-03-01 MED ORDER — AIRSUPRA 90-80 MCG/ACT IN AERO: 2.0000 | INHALATION_SPRAY | RESPIRATORY_TRACT | 5 refills | Status: AC | PRN

## 2023-03-01 NOTE — Progress Notes (Signed)
her on November the 1 and at 930  @Patient  ID: Deanna [name], female    DOB: [birth year], 62 y.o.   MRN: [id number]  Chief Complaint  Patient presents with   Follow-up    Referring provider: [name], MD  HPI: 62 year old female never smoker followed for obstructive sleep apnea and chronic rhinitis Medical history significant for lupus (Followed by Rheumatology -Plaquenil) , History of CVA   TEST/EVENTS :  PSG 11/26/20 >> AHI 13.7, SpO2 low 87% Auto CPAP 02/23/21 to 03/24/21 >> used on 30 of 30 nights with average 4 hrs 14 min.  Average AHI 0.6 with median CPAP 6 and 95 th percentile CPAP 8 cm H2O.   Cardiac Tests:  Echo 07/28/16 >> EF 60 to 65%, grade 1 DD, mild LVH, mild AR, aortic root 38 mm  03/01/2023 Follow up : OSA, CR  Patient presents for a 1 year follow-up.  She has underlying mild obstructive sleep apnea.  She is on nocturnal CPAP.  Patient says she been having ongoing difficulty wearing her CPAP and tolerating it.  Has had some intermittent nasal congestion and URI symptoms.  Says she tries to wear it but then cannot tolerate it.  Download shows very poor compliance.  Daily average usage at 2 hours.  AHI 0.3. Using a nasal mask.   Patient also has been having some difficulty with shortness of breath and chest tightness with activities.  Has been seeing cardiology.  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  TEE January 19, 2023 showed EF 60 to 65%, right ventricle size normal.  Right ventricular systolic function normal.,  No thrombus noted.  Aortic valve regurg moderate, no aortic valve stenosis, moderate dilation of the aortic root and moderate dilation of the ascending aorta.  Heart cath showed no evidence of coronary artery disease, normal right heart pressures, aortic root angiography with moderate to severe aortic insufficiency.  Cardiopulmonary exercise test February 27, 2023  was normal. She is never smoker. 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 No huttub or basement . No history asthma, COPD . CT Chest showed atlectasis in RML and bases. Neg for PE.  Has chronic allergies. Takes Allegra daily . Does saline nasal rinses. Has daily dry cough. Lots of throat clearing .  Had previous sinus surgery.   Allergies  Allergen Reactions   Egg-Derived Products Nausea And Vomiting   Lactose Intolerance (Gi) Other (See Comments)    vomiting    Immunization History  Administered Date(s) Administered   Meningococcal B, OMV 12/02/2015   PFIZER Comirnaty(Gray Top)Covid-19 Tri-Sucrose Vaccine 07/19/2019, 08/09/2019, 06/16/2020   PPD Test 01/21/2018   Pneumococcal Polysaccharide-23 10/14/2010, 10/21/2014    Past Medical History:  Diagnosis Date   Allergy    GERD (gastroesophageal reflux disease)    "does not need meds anymore"   Heart murmur    Hyperlipidemia    Lactose intolerance    Lupus    Mini stroke    OSA (obstructive sleep apnea)    Positive ANA (antinuclear antibody)    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HCC)     Tobacco History: Social History   Tobacco Use  Smoking Status Never  Smokeless Tobacco Never   Counseling given: Not Answered   Outpatient Medications Prior to Visit  Medication Sig Dispense Refill   Cholecalciferol 50 MCG (2000 UT) TBDP Take 2,000 Units by mouth daily.     clopidogrel (PLAVIX) 75 MG tablet  TAKE 1 TABLET BY MOUTH EVERY DAY 90 tablet 1   Ferrous Sulfate (IRON PO) Take 65 mg by mouth daily.     fexofenadine (ALLEGRA) 180 MG tablet TAKE 1 TABLET BY MOUTH EVERY DAY (Patient taking differently: Take 180 mg by mouth as needed for allergies.) 30 tablet 2   furosemide (LASIX) 20 MG tablet Take 1 tablet (20 mg total) by mouth daily. 90 tablet 2   hydroxychloroquine (PLAQUENIL) 200 MG tablet TAKE 2 TABLETS BY MOUTH EVERY DAY 180 tablet 0   naproxen sodium (ALEVE) 220 MG tablet Take 440 mg by mouth daily as needed (Headache).      nitroGLYCERIN (NITROSTAT) 0.4 MG SL tablet Place 1 tablet (0.4 mg total) under the tongue every 5 (five) minutes as needed for chest pain. 25 tablet 3   potassium chloride (KLOR-CON) 10 MEQ tablet Take 1 tablet (10 mEq total) by mouth 2 (two) times daily. 180 tablet 3   No facility-administered medications prior to visit.     Review of Systems:   Constitutional:   No  weight loss, night sweats,  Fevers, chills, +fatigue, or  lassitude.  HEENT:   No headaches,  Difficulty swallowing,  Tooth/dental problems, or  Sore throat,                No sneezing, itching, ear ache, + nasal congestion, post nasal drip,   CV:  No chest pain,  Orthopnea, PND, swelling in lower extremities, anasarca, dizziness, palpitations, syncope.   GI  No heartburn, indigestion, abdominal pain, nausea, vomiting, diarrhea, change in bowel habits, loss of appetite, bloody stools.   Resp:   No chest wall deformity  Skin: no rash or lesions.  GU: no dysuria, change in color of urine, no urgency or frequency.  No flank pain, no hematuria   MS:  No joint pain or swelling.  No decreased range of motion.  No back pain.    Physical Exam  BP 118/70 (BP Location: Left Arm, Patient Position: Sitting, Cuff Size: Large)   Pulse 74   Temp 98.2 F (36.8 C) (Oral)   Ht 5\' 9"  (1.753 m)   Wt 230 lb 12.8 oz (104.7 kg)   SpO2 97%   BMI 34.08 kg/m   GEN: A/Ox3; pleasant , NAD, well nourished    HEENT:  [place]/AT,  NOSE-clear drainage, THROAT-clear, no lesions, no postnasal drip or exudate noted.   NECK:  Supple w/ fair ROM; no JVD; normal carotid impulses w/o bruits; no thyromegaly or nodules palpated; no lymphadenopathy.    RESP  Clear  P & A; w/o, wheezes/ rales/ or rhonchi. no accessory muscle use, no dullness to percussion  CARD:  RRR, no m/r/g, no peripheral edema, pulses intact, no cyanosis or clubbing.  GI:   Soft & nt; nml bowel sounds; no organomegaly or masses detected.   Musco: Warm bil, no deformities or  joint swelling noted.   Neuro: alert, no focal deficits noted.    Skin: Warm, no lesions or rashes    Lab Results:  CBC   BMET   BNP No results found for: "BNP"  ProBNP   Imaging:  Administration History     None           No data to display          No results found for: "NITRICOXIDE"      Assessment & Plan:   RAD (reactive airway disease) Suspect patient may have a component of mild intermittent asthma/reactive airways with daily  intermittent cough, nasal congestion, chronic postnasal drainage and throat clearing.  Cardiopulmonary stress test was normal.  No apparent airflow obstruction or restriction noted.  No desaturations.  Will set patient up for a full PFT.  She also has underlying lupus.  Previous chest x-ray showed c atelectasis in the right middle lobe and bilateral bases..  Could consider high-resolution CT chest to rule out possible interstitial process if PFTs/DLCO is suggestive. Check allergy panel.  Control for triggers with chronic rhinitis and GERD Trial of Airsupra   Plan  Patient Instructions  Labs today.  Continue on Allegra daily  Sips of water to soothe throat , avoid throat clearing  Delsym 2 tsp Twice daily  As needed  cough  Begin Dymista 2 puffs daily  Begin Chlorpheniramine 4mg  At bedtime   Pepcid 20mg  At bedtime   Try to restart CPAP At bedtime , wear all night long .  Saline gel At bedtime   AirSupra 2 puffs every 4-6 hr As needed   Follow up with Dr. Wynona Neat or Tushar Enns NP in 6 weeks with PFT      Chronic rhinitis Chronic rhinitis with daily symptoms.  Add in Dymista nasal spray and chlorpheniramine.   Plan  Patient Instructions  Labs today.  Continue on Allegra daily  Sips of water to soothe throat , avoid throat clearing  Delsym 2 tsp Twice daily  As needed  cough  Begin Dymista 2 puffs daily  Begin Chlorpheniramine 4mg  At bedtime   Pepcid 20mg  At bedtime   Try to restart CPAP At bedtime , wear all night  long .  Saline gel At bedtime   AirSupra 2 puffs every 4-6 hr As needed   Follow up with Dr. Wynona Neat or Tishia Maestre NP in 6 weeks with PFT      OSA (obstructive sleep apnea) Mild OSA -CPAP intolerant -encouraged on daily use.   Plan  Patient Instructions  Labs today.  Continue on Allegra daily  Sips of water to soothe throat , avoid throat clearing  Delsym 2 tsp Twice daily  As needed  cough  Begin Dymista 2 puffs daily  Begin Chlorpheniramine 4mg  At bedtime   Pepcid 20mg  At bedtime   Try to restart CPAP At bedtime , wear all night long .  Saline gel At bedtime   AirSupra 2 puffs every 4-6 hr As needed   Follow up with Dr. Wynona Neat or Latamara Melder NP in 6 weeks with PFT        Rubye Oaks, NP 03/01/2023

## 2023-03-01 NOTE — Assessment & Plan Note (Signed)
Chronic rhinitis with daily symptoms.  Add in Dymista nasal spray and chlorpheniramine.   Plan  Patient Instructions  Labs today.  Continue on Allegra daily  Sips of water to soothe throat , avoid throat clearing  Delsym 2 tsp Twice daily  As needed  cough  Begin Dymista 2 puffs daily  Begin Chlorpheniramine 4mg  At bedtime   Pepcid 20mg  At bedtime   Try to restart CPAP At bedtime , wear all night long .  Saline gel At bedtime   AirSupra 2 puffs every 4-6 hr As needed   Follow up with Dr. Wynona Neat or Yetta Marceaux NP in 6 weeks with PFT

## 2023-03-01 NOTE — Patient Instructions (Addendum)
Labs today.  Continue on Allegra daily  Sips of water to soothe throat , avoid throat clearing  Delsym 2 tsp Twice daily  As needed  cough  Begin Dymista 2 puffs daily  Begin Chlorpheniramine 4mg  At bedtime   Pepcid 20mg  At bedtime   Try to restart CPAP At bedtime , wear all night long .  Saline gel At bedtime   AirSupra 2 puffs every 4-6 hr As needed   Follow up with Dr. Wynona Neat or Missy Baksh NP in 6 weeks with PFT

## 2023-03-01 NOTE — Assessment & Plan Note (Signed)
Mild OSA -CPAP intolerant -encouraged on daily use.   Plan  Patient Instructions  Labs today.  Continue on Allegra daily  Sips of water to soothe throat , avoid throat clearing  Delsym 2 tsp Twice daily  As needed  cough  Begin Dymista 2 puffs daily  Begin Chlorpheniramine 4mg  At bedtime   Pepcid 20mg  At bedtime   Try to restart CPAP At bedtime , wear all night long .  Saline gel At bedtime   AirSupra 2 puffs every 4-6 hr As needed   Follow up with Dr. Wynona Neat or Jalayah Gutridge NP in 6 weeks with PFT

## 2023-03-01 NOTE — Assessment & Plan Note (Addendum)
Suspect patient may have a component of mild intermittent asthma/reactive airways with daily intermittent cough, nasal congestion, chronic postnasal drainage and throat clearing.  Cardiopulmonary stress test was normal.  No apparent airflow obstruction or restriction noted.  No desaturations.  Will set patient up for a full PFT.  She also has underlying lupus.  Previous chest x-ray showed c atelectasis in the right middle lobe and bilateral bases..  Could consider high-resolution CT chest to rule out possible interstitial process if PFTs/DLCO is suggestive. Check allergy panel.  Control for triggers with chronic rhinitis and GERD Trial of Airsupra   Plan  Patient Instructions  Labs today.  Continue on Allegra daily  Sips of water to soothe throat , avoid throat clearing  Delsym 2 tsp Twice daily  As needed  cough  Begin Dymista 2 puffs daily  Begin Chlorpheniramine 4mg  At bedtime   Pepcid 20mg  At bedtime   Try to restart CPAP At bedtime , wear all night long .  Saline gel At bedtime   AirSupra 2 puffs every 4-6 hr As needed   Follow up with Dr. Wynona Neat or Farhiya Rosten NP in 6 weeks with PFT

## 2023-03-02 LAB — RESPIRATORY ALLERGY PROFILE REGION II ~~LOC~~

## 2023-03-02 LAB — INTERPRETATION:

## 2023-03-09 ENCOUNTER — Telehealth: Payer: Self-pay

## 2023-03-09 ENCOUNTER — Other Ambulatory Visit (HOSPITAL_COMMUNITY): Payer: Self-pay

## 2023-03-09 NOTE — Telephone Encounter (Signed)
*  Pulm  Pharmacy Patient Advocate Encounter  Received notification from [facility] that Prior Authorization for Azelastine-Fluticasone 137-50MCG/ACT suspension  has been APPROVED from 03/09/2023 to 03/07/2024. Ran test claim, Copay is $0.00. This test claim was processed through [facility]- copay amounts may vary at other pharmacies due to pharmacy/plan contracts, or as the patient moves through the different stages of their insurance plan.   PA #/Case ID/Reference #: [id number]

## 2023-03-13 ENCOUNTER — Ambulatory Visit: Payer: Managed Care, Other (non HMO) | Admitting: Physician Assistant

## 2023-03-27 ENCOUNTER — Ambulatory Visit: Payer: Managed Care, Other (non HMO) | Admitting: Nurse Practitioner

## 2023-04-15 ENCOUNTER — Other Ambulatory Visit: Payer: Self-pay | Admitting: Internal Medicine

## 2023-04-15 DIAGNOSIS — I7300 Raynaud's syndrome without gangrene: Secondary | ICD-10-CM

## 2023-04-16 NOTE — Telephone Encounter (Signed)
Please call patient to schedule appt. Thank you.  Return in about 6 months (around 07/31/2023) for SLE on HCQ f/u 6mos.

## 2023-04-16 NOTE — Telephone Encounter (Signed)
Attempted to contact patient and left message to advise patient to call the office and schedule patient a follow up appointment.

## 2023-04-16 NOTE — Telephone Encounter (Signed)
Last Fill: 01/16/2023  Eye exam: 01/10/2023   Labs: 01/16/2023 normal  Next Visit: message sent to front desk to schedule appt, Return in about 6 months (around 07/31/2023) for SLE on HCQ f/u 6mos.   Last Visit: 01/31/2023  [id number] connective tissue disease   Current Dose per office note 01/31/2023: hydroxychloroquine 400 mg daily.   Okay to refill Plaquenil?

## 2023-05-01 ENCOUNTER — Other Ambulatory Visit (HOSPITAL_BASED_OUTPATIENT_CLINIC_OR_DEPARTMENT_OTHER): Payer: Self-pay

## 2023-05-02 ENCOUNTER — Ambulatory Visit (HOSPITAL_BASED_OUTPATIENT_CLINIC_OR_DEPARTMENT_OTHER): Payer: Managed Care, Other (non HMO) | Admitting: Pulmonary Disease

## 2023-05-02 DIAGNOSIS — J4520 Mild intermittent asthma, uncomplicated: Secondary | ICD-10-CM

## 2023-05-02 LAB — PULMONARY FUNCTION TEST
DL/VA % pred: 115 %
DL/VA: 4.76 ml/min/mmHg/L
DLCO cor % pred: 81 %
DLCO cor: 18.61 ml/min/mmHg
DLCO unc % pred: 81 %
DLCO unc: 18.61 ml/min/mmHg
FEF 25-75 Post: 2.64 L/s
FEF 25-75 Pre: 2.07 L/s
FEF2575-%Change-Post: 27 %
FEF2575-%Pred-Post: 103 %
FEF2575-%Pred-Pre: 80 %
FEV1-%Change-Post: 9 %
FEV1-%Pred-Post: 80 %
FEV1-%Pred-Pre: 73 %
FEV1-Post: 2.34 L
FEV1-Pre: 2.13 L
FEV1FVC-%Change-Post: 3 %
FEV1FVC-%Pred-Pre: 98 %
FEV6-%Change-Post: 9 %
FEV6-%Pred-Post: 81 %
FEV6-%Pred-Pre: 74 %
FEV6-Post: 2.94 L
FEV6-Pre: 2.68 L
FEV6FVC-%Change-Post: 0 %
FEV6FVC-%Pred-Post: 102 %
FEV6FVC-%Pred-Pre: 103 %
FVC-%Change-Post: 6 %
FVC-%Pred-Post: 79 %
FVC-%Pred-Pre: 74 %
FVC-Post: 2.96 L
FVC-Pre: 2.77 L
Post FEV1/FVC ratio: 79 %
Post FEV6/FVC ratio: 99 %
Pre FEV1/FVC ratio: 77 %
Pre FEV6/FVC Ratio: 100 %
RV % pred: 76 %
RV: 1.65 L
TLC % pred: 85 %
TLC: 4.78 L

## 2023-05-02 NOTE — Patient Instructions (Signed)
Full PFT Performed Today  

## 2023-05-02 NOTE — Progress Notes (Signed)
Full PFT Performed Today  

## 2023-05-03 ENCOUNTER — Telehealth: Payer: Self-pay | Admitting: *Deleted

## 2023-05-03 NOTE — Telephone Encounter (Signed)
Called and spoke with patient, advised that she would need to take her SD card to [facility] so they could get the data off of her card.  They can then fax it to our office.  She verbalize understanding.  I provided her with the address for the location on [address] ([address]) and the phone number to call to see if she needs an appointment.  Nothing further needed.

## 2023-05-04 ENCOUNTER — Telehealth: Payer: Managed Care, Other (non HMO) | Admitting: Adult Health

## 2023-05-04 ENCOUNTER — Encounter: Payer: Self-pay | Admitting: Adult Health

## 2023-05-04 DIAGNOSIS — G4730 Sleep apnea, unspecified: Secondary | ICD-10-CM

## 2023-05-04 DIAGNOSIS — J4520 Mild intermittent asthma, uncomplicated: Secondary | ICD-10-CM | POA: Diagnosis not present

## 2023-05-04 DIAGNOSIS — J310 Chronic rhinitis: Secondary | ICD-10-CM

## 2023-05-04 DIAGNOSIS — G4733 Obstructive sleep apnea (adult) (pediatric): Secondary | ICD-10-CM

## 2023-05-04 NOTE — Patient Instructions (Signed)
Continue on Allegra daily  Sips of water to soothe throat , avoid throat clearing  Delsym 2 tsp Twice daily  As needed  cough  Dymista 2 puffs daily  Chlorpheniramine 4mg  At bedtime   Pepcid 20mg  At bedtime   CPAP At bedtime , wear all night long .  Bring CPAP SD card to next visit. Saline gel At bedtime   AirSupra 2 puffs every 4-6 hr As needed   Follow up with Dr. Wynona Neat in 3-4 months and As needed

## 2023-05-04 NOTE — Progress Notes (Signed)
Virtual Visit via Video Note  I connected with [name] on 05/04/23 at  4:00 PM EST by a video enabled telemedicine application and verified that I am speaking with the correct person using two identifiers.  Location: Patient: Home  Provider: Office    I discussed the limitations of evaluation and management by telemedicine and the availability of in person appointments. The patient expressed understanding and agreed to proceed.  History of Present Illness: 62 year old female never smoker followed for obstructive sleep apnea and chronic rhinitis Medical history significant for lupus-on Plaquenil.  Followed by rheumatology History of stroke  Sh  She is never smoker. 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 No huttub or basement . No history asthma, COPD   Today's video visit is a 50-month follow-up.  Patient is followed for obstructive sleep apnea.  She remains on CPAP therapy.  Tries to wear each night but sometimes forgets to put it on.  CPAP download requested. She will need to bring in SD card.   Last visit patient was having some difficulty with shortness of breath cough  and chest tightness.  Had been seen by cardiology.  Echo June 2024 showed preserved EF, grade 1 diastolic dysfunction, possible semimembranous VSD with left-to-right.  TEE on January 19, 2023 showed EF at 66 5%.  RV size was normal.  Right ventricular systolic function normal.  No thrombus noted.  Moderate aortic valve regurg.  No valve stenosis.  She underwent heart cath that showed no evidence of coronary artery disease.  Normal right heart pressures.  Aortic root angiography with moderate to severe aortic insufficiency.  Cardiopulmonary exercise test February 27, 2023 was normal. CT chest showed atelectasis of the right middle lobe and bases.  Negative for PE.  She was treated for possible mild intermittent asthma with upper airway cough syndrome.  Recommend to begin Dymista, chlor tabs, Pepcid at bedtime and Allegra  daily.  She was also given an Airsupra inhaler.  She says she has been feeling better things had improved.  However 2 days ago she started feel like she got a cold again.  She does teach at preschool.  She was having some sinus drainage nasal stuffiness and intermittent cough.  She has no fever or discolored mucus.  Good appetite. PFTs were done May 02, 2023 that showed mild restriction with an FEV1 at 80%, ratio 79, FVC 79%, no significant bronchodilator response, DLCO 81%.  Past Medical History:  Diagnosis Date   Allergy    GERD (gastroesophageal reflux disease)    "does not need meds anymore"   Heart murmur    Hyperlipidemia    Lactose intolerance    Lupus    Mini stroke    OSA (obstructive sleep apnea)    Positive ANA (antinuclear antibody)    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HCC)       Observations/Objective: PSG 11/26/20 >> AHI 13.7, SpO2 low 87% Auto CPAP 02/23/21 to 03/24/21 >> used on 30 of 30 nights with average 4 hrs 14 min.  Average AHI 0.6 with median CPAP 6 and 95 th percentile CPAP 8 cm H2O.   Cardiac Tests:  Echo 07/28/16 >> EF 60 to 65%, grade 1 DD, mild LVH, mild AR, aortic root 38 mm  05/04/2023  Appears well in no acute distress Assessment and Plan: Mild intermittent asthma/reactive airways-patient had improvement with treatment aimed at postnasal drainage and reflux.  Patient is a Manufacturing systems engineer.  Seems to have an upper respiratory infection over the last 2  days with nasal congestion and drainage.  Patient is encouraged on symptomatic treatment.  Rest and fluids.  She is to call back if she is not improving or symptoms worsen with discolored mucus or fever.  Sleep apnea-encouraged on CPAP usage at nighttime.  Patient education given.  Bring in SD card to next visit.  Plan  Patient Instructions  Continue on Allegra daily  Sips of water to soothe throat , avoid throat clearing  Delsym 2 tsp Twice daily  As needed  cough  Dymista 2 puffs daily   Chlorpheniramine 4mg  At bedtime   Pepcid 20mg  At bedtime   CPAP At bedtime , wear all night long .  Bring CPAP SD card to next visit. Saline gel At bedtime   AirSupra 2 puffs every 4-6 hr As needed   Follow up with Dr. Wynona Neat in 3-4 months and As needed      Follow Up Instructions:    I discussed the assessment and treatment plan with the patient. The patient was provided an opportunity to ask questions and all were answered. The patient agreed with the plan and demonstrated an understanding of the instructions.   The patient was advised to call back or seek an in-person evaluation if the symptoms worsen or if the condition fails to improve as anticipated.  I provided 30  minutes of non-face-to-face time during this encounter.   Rubye Oaks, NP

## 2023-06-14 ENCOUNTER — Encounter: Payer: Self-pay | Admitting: Family Medicine

## 2023-06-15 NOTE — Telephone Encounter (Signed)
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 She is having "Shortness of breath, using my inhaler more this week then I have in the last two months. Runny nose, little coughing". She is asking if she should have a chest xray to rule any/ all of those out.

## 2023-06-18 NOTE — Telephone Encounter (Signed)
Still having symptoms

## 2023-06-19 NOTE — Telephone Encounter (Signed)
 SOB with any activity that takes exertion. Getting dressed, bending over to tie shoes, etc. She is no longer having the runny nose or cough. She is using inhaler but hasn't had to use it last 2 days. She was using it once a day at least and doesn't normally need it everyday. It was worse with the cold.

## 2023-06-20 NOTE — Telephone Encounter (Signed)
 Flu illness 2-3 weeks ago with cough, body aches and drainage. Took about a week to get over. Wheezing has improved .  But still short of breath .  Will need office visit for evalution  OV 06/22/23 at 11 am.

## 2023-06-20 NOTE — Telephone Encounter (Signed)
 Pt. Scheduled for 11am on Friday 06/22/2023 (ok to double book per TP). Pt. Aware.

## 2023-06-22 ENCOUNTER — Ambulatory Visit (INDEPENDENT_AMBULATORY_CARE_PROVIDER_SITE_OTHER): Payer: Managed Care, Other (non HMO)

## 2023-06-22 ENCOUNTER — Ambulatory Visit (INDEPENDENT_AMBULATORY_CARE_PROVIDER_SITE_OTHER): Payer: Self-pay | Admitting: Adult Health

## 2023-06-22 VITALS — BP 112/76 | HR 71 | Temp 96.9°F | Ht 69.0 in | Wt 232.4 lb

## 2023-06-22 DIAGNOSIS — R0981 Nasal congestion: Secondary | ICD-10-CM

## 2023-06-22 DIAGNOSIS — J4520 Mild intermittent asthma, uncomplicated: Secondary | ICD-10-CM | POA: Diagnosis not present

## 2023-06-22 DIAGNOSIS — J4531 Mild persistent asthma with (acute) exacerbation: Secondary | ICD-10-CM

## 2023-06-22 DIAGNOSIS — J0191 Acute recurrent sinusitis, unspecified: Secondary | ICD-10-CM

## 2023-06-22 DIAGNOSIS — M329 Systemic lupus erythematosus, unspecified: Secondary | ICD-10-CM

## 2023-06-22 DIAGNOSIS — J45909 Unspecified asthma, uncomplicated: Secondary | ICD-10-CM | POA: Insufficient documentation

## 2023-06-22 DIAGNOSIS — J019 Acute sinusitis, unspecified: Secondary | ICD-10-CM | POA: Insufficient documentation

## 2023-06-22 LAB — POCT INFLUENZA A/B
Influenza A, POC: NEGATIVE
Influenza B, POC: NEGATIVE

## 2023-06-22 MED ORDER — PREDNISONE 20 MG PO TABS: 20.0000 mg | ORAL_TABLET | Freq: Every day | ORAL | 0 refills | Status: DC

## 2023-06-22 MED ORDER — AZITHROMYCIN 250 MG PO TABS: ORAL_TABLET | ORAL | 0 refills | Status: AC

## 2023-06-22 NOTE — Progress Notes (Signed)
 @Patient  ID: Deanna [name], female    DOB: 1960/05/27, 64 y.o.   MRN: [id number]  Chief Complaint  Patient presents with   Follow-up    Referring provider: Mercer Clotilda SAUNDERS, MD  HPI: This 63 year old female never smoker followed for obstructive sleep apnea chronic rhinitis and mild intermittent asthma-working diagnosis Medical history significant for stroke, lupus (followed by rheumatology on Plaquenil )  TEST/EVENTS :  PSG 11/26/20 >> AHI 13.7, SpO2 low 87% Auto CPAP 02/23/21 to 03/24/21 >> used on 30 of 30 nights with average 4 hrs 14 min.  Average AHI 0.6 with median CPAP 6 and 95 th percentile CPAP 8 cm H2O.   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    TEE January 19, 2023 showed EF 60 to 65%, right ventricle size normal.  Right ventricular systolic function normal.,  No thrombus noted.  Aortic valve regurg moderate, no aortic valve stenosis, moderate dilation of the aortic root and moderate dilation of the ascending aorta.    Left Heart cath showed no evidence of coronary artery disease, normal right heart pressures, aortic root angiography with moderate to severe aortic insufficiency.   Cardiopulmonary exercise test February 27, 2023 was normal.   Pulm workup :  Never smoker. Works in academic librarian . No Pets. No huttub or basement . No history COPD .   09/2022 CT Chest showed atlectasis in RML and bases. Neg for PE.  Chronic allergic rhinitis /sinusitis > Takes Allegra  daily . Does saline nasal rinses. Hx of sinus surgery   PFTs were done May 02, 2023 that showed mild restriction with an FEV1 at 80%, ratio 79, FVC 79%, no significant bronchodilator response, DLCO 81%.    06/22/2023 Acute office visit  Patient presents for an acute office visit.  She complains over the  last 3 weeks that she has had nasal congestion, sinus drainage, sinus pressure, postnasal drainage and discolored green nasal discharge.  She had associated body aches.  Symptoms have waxed and waned but worsened 2 days ago.  She has had no fever.  She is unable to get a flu shot.  She did test for COVID at home and was negative.  Patient is a manufacturing systems engineer and says that she has been exposed to multiple sick children that have had the flu, croup and RSV.  Patient says her cough is intermittent.  No significant wheezing.  She has been using some over-the-counter cold and sinus medications without much relief.  Influenza nasal swab today in the office is negative. As above patient does have lupus and is on Plaquenil . She has chronic allergic rhinitis and is prone to recurrent sinusitis.  She remains on maintenance regimen of Allegra  daily, Chlortab at bedtime, saline nasal rinses and Dymista .   Patient does have sleep apnea.  Remains on CPAP.  Says it has been harder to wear her CPAP due to the nasal congestion.  CPAP download shows 24% usage with daily average usage around 3 hours.  Patient is on auto CPAP 5 to 15 cm H2O.  AHI 1.6/hour.  Allergies  Allergen Reactions   Egg-Derived Products Nausea And Vomiting   Lactose Intolerance (Gi) Other (See Comments)    vomiting    Immunization History  Administered Date(s) Administered   Meningococcal  B, OMV 12/02/2015   PFIZER Comirnaty(Gray Top)Covid-19 Tri-Sucrose Vaccine 07/19/2019, 08/09/2019, 06/16/2020   PPD Test 01/21/2018   Pneumococcal Polysaccharide-23 10/14/2010, 10/21/2014    Past Medical History:  Diagnosis Date   Allergy     GERD (gastroesophageal reflux disease)    does not need meds anymore   Heart murmur    Hyperlipidemia    Lactose intolerance    Lupus    Mini stroke    OSA (obstructive sleep apnea)    Positive ANA (antinuclear antibody)    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HCC)     Tobacco History: Social  History   Tobacco Use  Smoking Status Never  Smokeless Tobacco Never   Counseling given: Not Answered   Outpatient Medications Prior to Visit  Medication Sig Dispense Refill   Albuterol-Budesonide  (AIRSUPRA ) 90-80 MCG/ACT AERO Inhale 2 puffs into the lungs every 4 (four) hours as needed. 1 g 5   Azelastine -Fluticasone  137-50 MCG/ACT SUSP Place 2 puffs into the nose daily. 1 g 5   Cholecalciferol 50 MCG (2000 UT) TBDP Take 2,000 Units by mouth daily.     clopidogrel  (PLAVIX ) 75 MG tablet TAKE 1 TABLET BY MOUTH EVERY DAY 90 tablet 1   Ferrous Sulfate (IRON PO) Take 65 mg by mouth daily.     fexofenadine  (ALLEGRA ) 180 MG tablet TAKE 1 TABLET BY MOUTH EVERY DAY (Patient taking differently: Take 180 mg by mouth as needed for allergies.) 30 tablet 2   furosemide  (LASIX ) 20 MG tablet Take 1 tablet (20 mg total) by mouth daily. 90 tablet 2   hydroxychloroquine  (PLAQUENIL ) 200 MG tablet TAKE 2 TABLETS BY MOUTH EVERY DAY 180 tablet 1   naproxen sodium (ALEVE) 220 MG tablet Take 440 mg by mouth daily as needed (Headache).     nitroGLYCERIN  (NITROSTAT ) 0.4 MG SL tablet Place 1 tablet (0.4 mg total) under the tongue every 5 (five) minutes as needed for chest pain. 25 tablet 3   potassium chloride  (KLOR-CON ) 10 MEQ tablet Take 1 tablet (10 mEq total) by mouth 2 (two) times daily. 180 tablet 3   No facility-administered medications prior to visit.     Review of Systems:   Constitutional:   No  weight loss, night sweats,  Fevers, chills, +fatigue, or  lassitude.  HEENT:   No headaches,  Difficulty swallowing,  Tooth/dental problems, or  Sore throat,                No sneezing, itching, ear ache,+ nasal congestion, post nasal drip,   CV:  No chest pain,  Orthopnea, PND, swelling in lower extremities, anasarca, dizziness, palpitations, syncope.   GI  No heartburn, indigestion, abdominal pain, nausea, vomiting, diarrhea, change in bowel habits, loss of appetite, bloody stools.   Resp: No shortness  of breath with exertion or at rest.  No excess mucus, no productive cough,  No non-productive cough,  No coughing up of blood.  No change in color of mucus.  No wheezing.  No chest wall deformity  Skin: no rash or lesions.  GU: no dysuria, change in color of urine, no urgency or frequency.  No flank pain, no hematuria   MS:  No joint pain or swelling.  No decreased range of motion.  No back pain.    Physical Exam  BP 112/76 (BP Location: Left Arm, Patient Position: Sitting, Cuff Size: Large)   Pulse 71   Temp (!) 96.9 F (36.1 C) (Temporal)   Ht 5' 9 (1.753 m)   Wt 232 lb  6.4 oz (105.4 kg)   SpO2 99%   BMI 34.32 kg/m   GEN: A/Ox3; pleasant , NAD, well nourished    HEENT:  [place]/AT,  EACs-clear, TMs-wnl, NOSE-clear, THROAT-clear, no lesions, no postnasal drip or exudate noted.   NECK:  Supple w/ fair ROM; no JVD; normal carotid impulses w/o bruits; no thyromegaly or nodules palpated; no lymphadenopathy.    RESP  Clear  P & A; w/o, wheezes/ rales/ or rhonchi. no accessory muscle use, no dullness to percussion  CARD:  RRR, no m/r/g, no peripheral edema, pulses intact, no cyanosis or clubbing.  GI:   Soft & nt; nml bowel sounds; no organomegaly or masses detected.   Musco: Warm bil, no deformities or joint swelling noted.   Neuro: alert, no focal deficits noted.    Skin: Warm, no lesions or rashes    Lab Results:  CBC  BMET  Imaging: No results found.  Administration History     None          Latest Ref Rng & Units 05/02/2023    3:42 PM  PFT Results  FVC-Pre L 2.77   FVC-Predicted Pre % 74   FVC-Post L 2.96   FVC-Predicted Post % 79   Pre FEV1/FVC % % 77   Post FEV1/FCV % % 79   FEV1-Pre L 2.13   FEV1-Predicted Pre % 73   FEV1-Post L 2.34   DLCO uncorrected ml/min/mmHg 18.61   DLCO UNC% % 81   DLCO corrected ml/min/mmHg 18.61   DLCO COR %Predicted % 81   DLVA Predicted % 115   TLC L 4.78   TLC % Predicted % 85   RV % Predicted % 76     No  results found for: NITRICOXIDE      Assessment & Plan:   Acute sinusitis Acute sinusitis.  Influenza negative for today in office is negative.  Patient's symptoms have been persisting and worsening over the last 3 weeks.  Will treat for acute sinusitis.  With a 10-day course of Augmentin .  Continue nasal rinses. Short course of prednisone  20 mg daily for 5 days. Advised on good handwashing.   Plan  Patient Instructions  Chest xray today  Augmentin  875mg  Twice daily  for 10 days , take with food.  Prednisone  20mg  daily for 5 days, take  with food.  Continue on Allegra  daily  Sips of water to soothe throat , avoid throat clearing  Delsym 2 tsp Twice daily  As needed  cough  Dymista  2 puffs daily  Chlorpheniramine 4mg  At bedtime   Pepcid 20mg  At bedtime   CPAP At bedtime , wear all night long .  Saline gel At bedtime   AirSupra  2 puffs every 4-6 hr As needed   Follow up with Dr. Neda in 3-4 months and As needed   Please contact office for sooner follow up if symptoms do not improve or worsen or seek emergency care      Asthma Mild intermittent asthma suspect patient has a component of reactive airways versus cough variant asthma with an allergic component.  Continue on current maintenance regimen.  Continue Airsupra  as needed.  If symptoms persist with ongoing cough or wheezing could consider a maintenance inhaler.  Previous PFTs were normal. Check chest x-ray today..   Plan  Patient Instructions  Chest xray today  Augmentin  875mg  Twice daily  for 10 days , take with food.  Prednisone  20mg  daily for 5 days, take  with food.  Continue on Allegra   daily  Sips of water to soothe throat , avoid throat clearing  Delsym 2 tsp Twice daily  As needed  cough  Dymista  2 puffs daily  Chlorpheniramine 4mg  At bedtime   Pepcid 20mg  At bedtime   CPAP At bedtime , wear all night long .  Saline gel At bedtime   AirSupra  2 puffs every 4-6 hr As needed   Follow up with Dr. Neda in  3-4 months and As needed   Please contact office for sooner follow up if symptoms do not improve or worsen or seek emergency care      Lupus (systemic lupus erythematosus) (HCC) Lupus-continue follow-up with rheumatology.  Patient is immunocompromised on Plaquenil .  Continue to monitor closely with history of recurrent infections.  Patient is advised to follow-up with rheumatology as planned.    Madelin Stank, NP 06/22/2023

## 2023-06-22 NOTE — Patient Instructions (Addendum)
 Chest xray today  Augmentin  875mg  Twice daily  for 10 days , take with food.  Prednisone  20mg  daily for 5 days, take  with food.  Continue on Allegra  daily  Sips of water to soothe throat , avoid throat clearing  Delsym 2 tsp Twice daily  As needed  cough  Dymista  2 puffs daily  Chlorpheniramine 4mg  At bedtime   Pepcid 20mg  At bedtime   CPAP At bedtime , wear all night long .  Saline gel At bedtime   AirSupra  2 puffs every 4-6 hr As needed   Follow up with Dr. Neda in 3-4 months and As needed   Please contact office for sooner follow up if symptoms do not improve or worsen or seek emergency care

## 2023-06-22 NOTE — Assessment & Plan Note (Signed)
 Acute sinusitis.  Influenza negative for today in office is negative.  Patient's symptoms have been persisting and worsening over the last 3 weeks.  Will treat for acute sinusitis.  With a 10-day course of Augmentin .  Continue nasal rinses. Short course of prednisone  20 mg daily for 5 days. Advised on good handwashing.   Plan  Patient Instructions  Chest xray today  Augmentin  875mg  Twice daily  for 10 days , take with food.  Prednisone  20mg  daily for 5 days, take  with food.  Continue on Allegra  daily  Sips of water to soothe throat , avoid throat clearing  Delsym 2 tsp Twice daily  As needed  cough  Dymista  2 puffs daily  Chlorpheniramine 4mg  At bedtime   Pepcid 20mg  At bedtime   CPAP At bedtime , wear all night long .  Saline gel At bedtime   AirSupra  2 puffs every 4-6 hr As needed   Follow up with Dr. Neda in 3-4 months and As needed   Please contact office for sooner follow up if symptoms do not improve or worsen or seek emergency care

## 2023-06-22 NOTE — Assessment & Plan Note (Signed)
 Mild intermittent asthma suspect patient has a component of reactive airways versus cough variant asthma with an allergic component.  Continue on current maintenance regimen.  Continue Airsupra  as needed.  If symptoms persist with ongoing cough or wheezing could consider a maintenance inhaler.  Previous PFTs were normal. Check chest x-ray today..   Plan  Patient Instructions  Chest xray today  Augmentin  875mg  Twice daily  for 10 days , take with food.  Prednisone  20mg  daily for 5 days, take  with food.  Continue on Allegra  daily  Sips of water to soothe throat , avoid throat clearing  Delsym 2 tsp Twice daily  As needed  cough  Dymista  2 puffs daily  Chlorpheniramine 4mg  At bedtime   Pepcid 20mg  At bedtime   CPAP At bedtime , wear all night long .  Saline gel At bedtime   AirSupra  2 puffs every 4-6 hr As needed   Follow up with Dr. Neda in 3-4 months and As needed   Please contact office for sooner follow up if symptoms do not improve or worsen or seek emergency care

## 2023-06-22 NOTE — Assessment & Plan Note (Signed)
 Lupus-continue follow-up with rheumatology.  Patient is immunocompromised on Plaquenil .  Continue to monitor closely with history of recurrent infections.  Patient is advised to follow-up with rheumatology as planned.

## 2023-06-26 ENCOUNTER — Encounter: Payer: Self-pay | Admitting: Cardiovascular Disease

## 2023-06-26 NOTE — Progress Notes (Unsigned)
  Cardiology Office Note:  .   Date:  06/27/2023  ID:  Deanna [name], DOB February 23, 1961, MRN [id number] PCP: [name], MD  [facility] Providers Cardiologist:  [name]    History of Present Illness: .   Deanna [name] is a 63 y.o. female with hx of HTN, aortic sclerosis, aortic sclerosis , hx of stroke , SLE   Seen with husband, Deanna [name]   Has DOE with walking , has some chest pressure with walking  The chest pressure is associated with the DOE This chest pressure / DOE is new - has had DOE for a month or so  Has been walking for years  The DOE and chest pressure lasts for several minutes   Usually does 1 mile at the gym   Is on plavix for a previous stroke   Echocardiogram from October 30, 2022 shows normal left ventricular systolic function with a EF of 55 to 60%.  She has grade 1 diastolic dysfunction. Trivial mitral regurgitation Moderate aortic insufficiency with calcification of the aortic valve.  There is no evidence of aortic stenosis. 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 There is a possible perimembranous VSD ( clinically not likely)     Fam hx :  Strong hx of strokes    Feb. 12, 2025 See is seen for follow up of her AI and progressive dyspnea TEE on Sept. 6, 2024 revealed  Normal LV EF of 60-65% Moderate AI, 3 leaflet AV  Mild aortic dilatation .  Asc aorta measured 42 mm  R and L heart cath from 01/19/23 Normal coronaries Mod- severe AI Normal right heart pressures  Cardiopulmnary stress test revealed normal peak VO2 of 15.6 ml/kg/min Dr. Gala Romney suggested that her obesity was likely contributing to her exercise intolerance  She was exercising until recently when she developed several URIs   ROS:    Studies Reviewed: .            Risk Assessment/Calculations:        Physical Exam: Blood pressure 132/76, pulse 74, height 5\' 9"  (1.753 m), weight 235 lb 9.6 oz (106.9 kg), SpO2 98%.     GEN:  Moderately obese, middle-aged female, no acute distress in no acute distress HEENT: Normal NECK: No JVD; No carotid bruits LYMPHATICS: No lymphadenopathy CARDIAC: RRR 2/6 diastolic murmur  with soft systolic murmur  RESPIRATORY:  Clear to auscultation without rales, wheezing or rhonchi  ABDOMEN: Soft, non-tender, non-distended MUSCULOSKELETAL:  No edema; No deformity  SKIN: Warm and dry NEUROLOGIC:  Alert and oriented x 3   ASSESSMENT AND PLAN: .       1.  Aortic insufficiency/shortness of breath with exertion:  She has moderate aortic insufficiency.  She is having some shortness of breath but at this point the valve does not appear to be responsible for worsening shortness of breath with exertion.  I told her that I suspect that at 1 point she may need to get this valve replaced but there is no indication for valve replacement at this time.  3.  Mild to moderate obesity: Encouraged her to work on weight loss I think this will help with her shortness of breath with exertion.       Dispo:   Signed, Kristeen Miss, MD

## 2023-06-27 ENCOUNTER — Encounter: Payer: Self-pay | Admitting: Cardiovascular Disease

## 2023-06-27 ENCOUNTER — Ambulatory Visit: Payer: Managed Care, Other (non HMO) | Attending: Cardiovascular Disease | Admitting: Cardiovascular Disease

## 2023-06-27 VITALS — BP 132/76 | HR 74 | Ht 69.0 in | Wt 235.6 lb

## 2023-06-27 DIAGNOSIS — I351 Nonrheumatic aortic (valve) insufficiency: Secondary | ICD-10-CM

## 2023-06-27 DIAGNOSIS — R0609 Other forms of dyspnea: Secondary | ICD-10-CM

## 2023-06-27 NOTE — Patient Instructions (Signed)
Follow-Up: At [facility], you and your health needs are our priority.  As part of our continuing mission to provide you with exceptional heart care, we have created designated Provider Care Teams.  These Care Teams include your primary Cardiologist (physician) and Advanced Practice Providers (APPs -  Physician Assistants and Nurse Practitioners) who all work together to provide you with the care you need, when you need it.  Your next appointment:   6 month(s)  Provider:   APP     1st Floor: - Lobby - Registration  - Pharmacy  - Lab - Cafe  2nd Floor: - PV Lab - Diagnostic Testing (echo, CT, nuclear med)  3rd Floor: - Vacant  4th Floor: - TCTS (cardiothoracic surgery) - AFib Clinic - Structural Heart Clinic - Vascular Surgery  - Vascular Ultrasound  5th Floor: - HeartCare Cardiology (general and EP) - Clinical Pharmacy for coumadin, hypertension, lipid, weight-loss medications, and med management appointments    Valet parking services will be available as well.

## 2023-07-03 ENCOUNTER — Ambulatory Visit (INDEPENDENT_AMBULATORY_CARE_PROVIDER_SITE_OTHER): Payer: Managed Care, Other (non HMO) | Admitting: Neurology

## 2023-07-03 ENCOUNTER — Encounter: Payer: Self-pay | Admitting: Neurology

## 2023-07-03 ENCOUNTER — Encounter: Payer: Self-pay | Admitting: Family Medicine

## 2023-07-03 VITALS — BP 98/69 | HR 92 | Ht 69.0 in | Wt 230.6 lb

## 2023-07-03 DIAGNOSIS — Z8673 Personal history of transient ischemic attack (TIA), and cerebral infarction without residual deficits: Secondary | ICD-10-CM | POA: Diagnosis not present

## 2023-07-03 DIAGNOSIS — R200 Anesthesia of skin: Secondary | ICD-10-CM | POA: Diagnosis not present

## 2023-07-03 DIAGNOSIS — R202 Paresthesia of skin: Secondary | ICD-10-CM

## 2023-07-03 MED ORDER — CLOPIDOGREL BISULFATE 75 MG PO TABS: 75.0000 mg | ORAL_TABLET | Freq: Every day | ORAL | 3 refills | Status: AC

## 2023-07-03 NOTE — Patient Instructions (Signed)
Good to see you. Continue all your medications. Follow-up with PCP for nose bleeds. Follow-up as needed, call for any changes.

## 2023-07-03 NOTE — Progress Notes (Signed)
NEUROLOGY FOLLOW UP OFFICE NOTE  Deanna [name] [id number] 1961-04-03  HISTORY OF PRESENT ILLNESS: I had the pleasure of seeing Deanna [name] in follow-up in the neurology clinic on 07/03/2023.  The patient was last seen 6 months ago. She is alone in the office today. She was initially seen in 2018 for episodes of vertigo. MRI brain in 2018 showed a faint punctate focus of reduced diffusion in the right frontal lobe deep white matter, difficult to see on the ADC map due to small size, this can be seen with acute ischemia or hyperacute demyelination. She was started on Plavix for secondary stroke prevention. Symptoms resolved then she called in June 2022 to report an increase in headaches, vertigo, left-sided partial numbness. Brain MRI no acute changes. On her last visit, she reported having Covid in January 2024 followed by continued dizziness, headaches, and nasal congestion. MRI brain with and without contrast done 10/2022 showed a punctate focus of probable restricted diffusion in the high right frontal cortex, suspicious for recent infarct, but artifact is also a differential consideration. 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 Stroke workup done included a normal CTA head and neck with no significant stenosis seen. HbA1c 5.7, LDL 98. 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 She follows with Cardiology.  Since her last visit, she reports neurological symptoms are controlled. She denies any headaches, dizziness, left-sided paresthesias, neck pain. Her main  concern are frequent nose bleeds. She is sleeping well with 7 hours of sleep. No falls. She had the flu last week and is trying to get over it.   History on Initial Assessment 07/20/2016: This is a pleasant 63 yo RH woman with a history of hyperlipidemia who reported a 3-year history of intermittent episodes of dizziness described as spinning, as well as feeling lightheaded with blurred vision lasting 20-30 seconds. Recently she started having different symptoms, around 2 weeks ago she started getting carsick, which is new for her. A week ago, her left hand was numb, "like it was not there," then 20-30 seconds later it started tingling. Face and leg were unaffected. She did not have any neck pain at that time, but 3 days later started having a headache with pressure over the frontal regions that she ascribed to her sinuses. She started having pretty significant intermittent nausea. She continued to have dizziness, last episode was a week ago. The headaches have resolved. Blurred vision still comes and goes. She denies any diplopia, dysarthria/dysphagia, back pain, bowel/bladder dysfunction, negative Lhermitte sign. She denies any prior history of focal symptoms, no prior history of vision loss. She denies any head injuries. 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 There is a family history of stroke.   She had an MRI brain with and without contrast last 07/18/2016 which I personally reviewed, there was a faint punctate focus of reduced diffusion in the right frontal lobe deep white matter, difficult to see on the ADC map due to small size, this can be seen with acute ischemia or hyperacute demyelination. There were at least 5 additional subcentimeter white matter, right parietal,  medial left thalamus, and left occipital horn FLAIR hyperintensities. There was mild prominence of the ventricles and sulci for age. No abnormal enhancement seen. Findings concerning for chronic  demyelination, less likely small old infarcts.    PAST MEDICAL HISTORY: Past Medical History:  Diagnosis Date   Allergy    GERD (gastroesophageal reflux disease)    "does not need meds anymore"   Heart murmur    Hyperlipidemia    Lactose intolerance    Lupus    Mini stroke    OSA (obstructive sleep apnea)    Positive ANA (antinuclear antibody)    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HCC)     MEDICATIONS: Current Outpatient Medications on File Prior to Visit  Medication Sig Dispense Refill   Albuterol-Budesonide (AIRSUPRA) 90-80 MCG/ACT AERO Inhale 2 puffs into the lungs every 4 (four) hours as needed. 1 g 5   Azelastine-Fluticasone 137-50 MCG/ACT SUSP Place 2 puffs into the nose daily. 1 g 5   Cholecalciferol 50 MCG (2000 UT) TBDP Take 2,000 Units by mouth daily.     clopidogrel (PLAVIX) 75 MG tablet TAKE 1 TABLET BY MOUTH EVERY DAY 90 tablet 1   Ferrous Sulfate (IRON PO) Take 65 mg by mouth daily.     fexofenadine (ALLEGRA) 180 MG tablet TAKE 1 TABLET BY MOUTH EVERY DAY (Patient taking differently: Take 180 mg by mouth as needed for allergies.) 30 tablet 2   furosemide (LASIX) 20 MG tablet Take 1 tablet (20 mg total) by mouth daily. 90 tablet 2   hydroxychloroquine (PLAQUENIL) 200 MG tablet TAKE 2 TABLETS BY MOUTH EVERY DAY 180 tablet 1   naproxen sodium (ALEVE) 220 MG tablet Take 440 mg by mouth daily as needed (Headache).     nitroGLYCERIN (NITROSTAT) 0.4 MG SL tablet Place 1 tablet (0.4 mg total) under the tongue every 5 (five) minutes as needed for chest pain. 25 tablet 3   potassium chloride (KLOR-CON) 10 MEQ tablet Take 1 tablet (10 mEq total) by mouth 2 (two) times daily. 180 tablet 3   No current facility-administered medications on file prior to visit.    ALLERGIES: Allergies  Allergen Reactions   Egg-Derived Products Nausea And Vomiting   Lactose Intolerance (Gi) Other (See Comments)    vomiting    FAMILY HISTORY: Family History  Problem Relation Age of  Onset   Arthritis Mother    Hyperlipidemia Mother    Stroke Mother    Hypertension Mother    Hypertension Father    Lupus Maternal Aunt    Breast cancer Daughter    Lupus Son    Colon cancer Neg Hx     SOCIAL HISTORY: Social History   Socioeconomic History   Marital status: Married    Spouse name: Not on file   Number of children: 2   Years of education: Not on file   Highest education level: Not on file  Occupational History   Not on file  Tobacco Use   Smoking status: Never   Smokeless tobacco: Never  Vaping Use   Vaping status: Never Used  Substance and Sexual Activity   Alcohol use: No   Drug use: No   Sexual activity: Not on file  Other Topics Concern   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 Situation: lives 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, husband and mother      Spiritual Beliefs: Christian      Lifestyle: walks a  little a few days per week, diet is ok      Right Handed      Drinks Caffeine      Lupus and RA 01-19-2023         Social Drivers of Health   Financial Resource Strain: Not on file  Food Insecurity: Not on file  Transportation Needs: Not on file  Physical Activity: Not on file  Stress: Not on file  Social Connections: Not on file  Intimate Partner Violence: Not on file     PHYSICAL EXAM: Vitals:   07/03/23 0809  BP: 98/69  Pulse: 92  SpO2: 97%   General: No acute distress Head:  Normocephalic/atraumatic Skin/Extremities: No rash, no edema Neurological Exam: alert and awake. No aphasia or dysarthria. Fund of knowledge is appropriate.Attention and concentration are normal.   Cranial nerves: Pupils equal, round. Extraocular movements intact with no nystagmus. Visual fields full.  No facial asymmetry.  Motor: Bulk and tone normal, muscle strength 5/5 throughout with no pronator drift.   Finger to nose testing intact.  Gait narrow-based and steady, no ataxia.   IMPRESSION: This is a  pleasant 63 yo RH woman with a history of hyperlipidemia, who presented initially with a 3-year history of intermittent dizziness suggestive of vertigo that had worsened with nausea, headaches, and a transient episode of left hand numbness and tingling. MRI brain in 2018 had shown a faint punctate focus of reduced diffusion in the right frontal lobe deep white matter, difficult to see on the ADC map due to small size, this can be seen with acute ischemia or hyperacute demyelination. Repeat MRI did not show any progression or new changes. She has been on Plavix for secondary stroke prevention and had another brain MRI last 10/2022 ordered for headaches and dizziness. It showed a punctate focus of probable restricted diffusion in the high right frontal cortex, suspicious for recent infarct, but artifact is also a differential consideration. Stroke workup included TTE and TEE, she follows closely with Cardiology. Continue Plavix 75mg  daily. She denies any headaches, dizziness, left-sided symptoms. Discuss epistaxis with PCP. She is doing well from a neurological standpoint and agrees to follow-up as needed, call for any changes.    Thank you for allowing me to participate in her care.  Please do not hesitate to call for any questions or concerns.    Patrcia Dolly, M.D.   CC: Dr. Salomon Fick

## 2023-07-05 ENCOUNTER — Other Ambulatory Visit: Payer: Self-pay

## 2023-07-05 ENCOUNTER — Encounter (HOSPITAL_BASED_OUTPATIENT_CLINIC_OR_DEPARTMENT_OTHER): Payer: Self-pay

## 2023-07-05 ENCOUNTER — Emergency Department (HOSPITAL_BASED_OUTPATIENT_CLINIC_OR_DEPARTMENT_OTHER)
Admission: EM | Admit: 2023-07-05 | Discharge: 2023-07-05 | Disposition: A | Discharge status: Home / Self Care | Payer: Managed Care, Other (non HMO) | Attending: Emergency Medicine | Admitting: Emergency Medicine

## 2023-07-05 DIAGNOSIS — R051 Acute cough: Secondary | ICD-10-CM | POA: Diagnosis not present

## 2023-07-05 DIAGNOSIS — Z7902 Long term (current) use of antithrombotics/antiplatelets: Secondary | ICD-10-CM | POA: Insufficient documentation

## 2023-07-05 DIAGNOSIS — R059 Cough, unspecified: Secondary | ICD-10-CM | POA: Diagnosis present

## 2023-07-05 MED ORDER — BENZONATATE 100 MG PO CAPS
100.0000 mg | ORAL_CAPSULE | Freq: Once | ORAL | Status: AC
  Administered 2023-07-05: 100 mg via ORAL
  Filled 2023-07-05: qty 1

## 2023-07-05 MED ORDER — LIDOCAINE VISCOUS HCL 2 % MT SOLN
15.0000 mL | OROMUCOSAL | 0 refills | Status: AC | PRN
Start: 2023-07-05 — End: ?

## 2023-07-05 MED ORDER — LIDOCAINE VISCOUS HCL 2 % MT SOLN
15.0000 mL | Freq: Once | OROMUCOSAL | Status: AC
  Administered 2023-07-05: 15 mL via OROMUCOSAL
  Filled 2023-07-05: qty 15

## 2023-07-05 MED ORDER — BENZONATATE 100 MG PO CAPS
100.0000 mg | ORAL_CAPSULE | Freq: Three times a day (TID) | ORAL | 0 refills | Status: DC
Start: 2023-07-05 — End: 2023-10-20

## 2023-07-05 NOTE — ED Provider Notes (Signed)
 [place] [facility] AT [facility] Provider Note   CSN: [id number] Arrival date & time: 07/05/23  1001     History Chief Complaint  Patient presents with   Cough    Deanna [name] is a 63 y.o. female patient with history of hyperlipidemia, GERD who presents to the emergency department today for further evaluation of a cough this been bothering her since Saturday.  Patient was recently diagnosed with flu on Saturday.  She has been having intermittent fevers since Saturday but has been fever free for 24 hours.  She states the cough is keeping her up at night and is productive with green sputum.  She is also stating that her cough is causing her left ear to hurt in addition to her throat.  She denies any nausea, vomiting, diarrhea, urinary symptoms.  She denies chest pain or shortness of breath.   Cough      Home Medications Prior to Admission medications   Medication Sig Start Date End Date Taking? Authorizing Provider  benzonatate (TESSALON) 100 MG capsule Take 1 capsule (100 mg total) by mouth every 8 (eight) hours. 07/05/23  Yes Meredeth Ide, Shulamis Wenberg M, PA-C  lidocaine (XYLOCAINE) 2 % solution Use as directed 15 mLs in the mouth or throat as needed for mouth pain. 07/05/23  Yes Meredeth Ide, Orton Capell M, PA-C  Albuterol-Budesonide (AIRSUPRA) 90-80 MCG/ACT AERO Inhale 2 puffs into the lungs every 4 (four) hours as needed. 03/01/23   Parrett, Virgel Bouquet, NP  Azelastine-Fluticasone 137-50 MCG/ACT SUSP Place 2 puffs into the nose daily. 03/01/23   Parrett, Virgel Bouquet, NP  Cholecalciferol 50 MCG (2000 UT) TBDP Take 2,000 Units by mouth daily.    [provider]  clopidogrel (PLAVIX) 75 MG tablet Take 1 tablet (75 mg total) by mouth daily. 07/03/23   Van Clines, MD  Ferrous Sulfate (IRON PO) Take 65 mg by mouth daily.    [provider]  fexofenadine (ALLEGRA) 180 MG tablet TAKE 1 TABLET BY MOUTH EVERY DAY Patient taking differently: Take 180 mg by mouth as  needed for allergies. 03/21/21   [name], MD  furosemide (LASIX) 20 MG tablet Take 1 tablet (20 mg total) by mouth daily. 01/11/23   Nahser, Deloris Ping, MD  hydroxychloroquine (PLAQUENIL) 200 MG tablet TAKE 2 TABLETS BY MOUTH EVERY DAY 04/17/23   Rice, Jamesetta Orleans, MD  naproxen sodium (ALEVE) 220 MG tablet Take 440 mg by mouth daily as needed (Headache).    [provider]  nitroGLYCERIN (NITROSTAT) 0.4 MG SL tablet Place 1 tablet (0.4 mg total) under the tongue every 5 (five) minutes as needed for chest pain. 01/11/23   Nahser, Deloris Ping, MD  potassium chloride (KLOR-CON) 10 MEQ tablet Take 1 tablet (10 mEq total) by mouth 2 (two) times daily. 01/26/23   Nahser, Deloris Ping, MD      Allergies    Egg-derived products and Lactose intolerance (gi)    Review of Systems   Review of Systems  Respiratory:  Positive for cough.   All other systems reviewed and are negative.   Physical Exam Updated Vital Signs BP 107/71 (BP Location: Left Arm)   Pulse 86   Temp 100 F (37.8 C) (Oral)   Resp 17   Ht 5\' 9"  (1.753 m)   Wt 104.3 kg   SpO2 96%   BMI 33.97 kg/m  Physical Exam Vitals and nursing note reviewed.  Constitutional:      General: She is not in acute distress.  Appearance: Normal appearance.  HENT:     Head: Normocephalic and atraumatic.     Ears:     Comments: External auditory canals bilaterally are normal.  TMs are normal bilaterally. Eyes:     General:        Right eye: No discharge.        Left eye: No discharge.  Cardiovascular:     Comments: Regular rate and rhythm.  S1/S2 are distinct without any evidence of murmur, rubs, or gallops.  Radial pulses are 2+ bilaterally.  Dorsalis pedis pulses are 2+ bilaterally.  No evidence of pedal edema. Pulmonary:     Comments: Clear to auscultation bilaterally.  Normal effort.  No respiratory distress.  No evidence of wheezes, rales, or rhonchi heard throughout. Abdominal:     General: Abdomen is flat. Bowel sounds are  normal. There is no distension.     Tenderness: There is no abdominal tenderness. There is no guarding or rebound.  Musculoskeletal:        General: Normal range of motion.     Cervical back: Neck supple.  Skin:    General: Skin is warm and dry.     Findings: No rash.  Neurological:     General: No focal deficit present.     Mental Status: She is alert.  Psychiatric:        Mood and Affect: Mood normal.        Behavior: Behavior normal.     ED Results / Procedures / Treatments   Labs (all labs ordered are listed, but only abnormal results are displayed) Labs Reviewed - No data to display  EKG None  Radiology No results found.  Procedures Procedures    Medications Ordered in ED Medications  lidocaine (XYLOCAINE) 2 % viscous mouth solution 15 mL (has no administration in time range)  benzonatate (TESSALON) capsule 100 mg (has no administration in time range)    ED Course/ Medical Decision Making/ A&P   {   Click here for ABCD2, HEART and other calculators  Medical Decision Making Deanna [name] is a 63 y.o. female patient who presents to the emergency department today for further evaluation of cough.  I have a low suspicion of pneumonia at this time.  Patient does have a low-grade fever here.  Lungs were clear and all fields.  She is coughing on exam.  No evidence of tachycardia.  Will hold off on any antibiotic treatment for pneumonia secondary to influenza at this time.  Will treat conservatively with Tessalon Perles and viscous lidocaine.  Will have her follow-up with her primary care doctor.  Strict return precautions were discussed.  She is safer discharge.   Risk Prescription drug management.    Final Clinical Impression(s) / ED Diagnoses Final diagnoses:  Acute cough    Rx / DC Orders ED Discharge Orders          Ordered    benzonatate (TESSALON) 100 MG capsule  Every 8 hours        07/05/23 1205    lidocaine (XYLOCAINE) 2 % solution  As  needed        07/05/23 [address] [place], [place] 07/05/23 1206    Vanetta Mulders, MD 07/07/23 [phone]

## 2023-07-05 NOTE — ED Triage Notes (Addendum)
In for eval of cough. Positive for flu on Saturday. OTC cough without relief. Left ear pain and sore throat.

## 2023-07-05 NOTE — Discharge Instructions (Signed)
I would like for you to follow-up with your primary care doctor for further evaluation.  You may return to the emergency department sooner for any worsening symptoms or if you start experiencing shortness of breath, recurrence of your fever, worsening cough.  I given you 2 medications.  Please take as prescribed.

## 2023-07-07 ENCOUNTER — Emergency Department (HOSPITAL_BASED_OUTPATIENT_CLINIC_OR_DEPARTMENT_OTHER)
Admission: EM | Admit: 2023-07-07 | Discharge: 2023-07-07 | Disposition: A | Discharge status: Home / Self Care | Payer: Managed Care, Other (non HMO) | Attending: Emergency Medicine | Admitting: Emergency Medicine

## 2023-07-07 ENCOUNTER — Other Ambulatory Visit: Payer: Self-pay

## 2023-07-07 ENCOUNTER — Emergency Department (HOSPITAL_BASED_OUTPATIENT_CLINIC_OR_DEPARTMENT_OTHER): Payer: Managed Care, Other (non HMO)

## 2023-07-07 ENCOUNTER — Encounter (HOSPITAL_BASED_OUTPATIENT_CLINIC_OR_DEPARTMENT_OTHER): Payer: Self-pay | Admitting: Emergency Medicine

## 2023-07-07 DIAGNOSIS — R052 Subacute cough: Secondary | ICD-10-CM | POA: Insufficient documentation

## 2023-07-07 DIAGNOSIS — Z79899 Other long term (current) drug therapy: Secondary | ICD-10-CM | POA: Diagnosis not present

## 2023-07-07 DIAGNOSIS — Z7902 Long term (current) use of antithrombotics/antiplatelets: Secondary | ICD-10-CM | POA: Insufficient documentation

## 2023-07-07 DIAGNOSIS — R0981 Nasal congestion: Secondary | ICD-10-CM | POA: Insufficient documentation

## 2023-07-07 LAB — GROUP A STREP BY PCR: Group A Strep by PCR: NOT DETECTED

## 2023-07-07 MED ORDER — AMOXICILLIN-POT CLAVULANATE 875-125 MG PO TABS: 1.0000 | ORAL_TABLET | Freq: Two times a day (BID) | ORAL | 0 refills | Status: AC

## 2023-07-07 MED ORDER — PREDNISONE 20 MG PO TABS: 40.0000 mg | ORAL_TABLET | Freq: Every day | ORAL | 0 refills | Status: AC

## 2023-07-07 MED ORDER — AZITHROMYCIN 250 MG PO TABS: 250.0000 mg | ORAL_TABLET | Freq: Every day | ORAL | 0 refills | Status: AC

## 2023-07-07 NOTE — ED Triage Notes (Signed)
 Pt diagnosed with flu last Saturday.  Pt continues to have a productive cough (green sputum) with difficulty sleeping.  Pt admits her throat is very painful and irritated.  No known fevers.   Continues with fatigue.

## 2023-07-07 NOTE — ED Provider Notes (Signed)
 [place] [facility] Provider Note   CSN: [id number] Arrival date & time: 07/07/23  1155     History  Chief Complaint  Patient presents with   Cough    Deanna [name] is a 63 y.o. female.   Cough   63 year old female presents emergency department with complaints of cough, nasal congestion.  Patient reports symptoms onset about a week ago.  Was in the emergency department and given medications with improvement of symptoms.  Has been trying over-the-counter medications with some improvement.  Presents due to continued cough/nasal congestion.  Denies any fever, chills, chest pain, shortness of breath, abdominal pain, nausea, vomiting.  Past medical history significant for GERD, hyperlipidemia, heart murmur, OSA, lupus, aortic root dilation  Home Medications Prior to Admission medications   Medication Sig Start Date End Date Taking? Authorizing Provider  amoxicillin-clavulanate (AUGMENTIN) 875-125 MG tablet Take 1 tablet by mouth every 12 (twelve) hours. 07/07/23  Yes Sherian Maroon A, PA  azithromycin (ZITHROMAX) 250 MG tablet Take 1 tablet (250 mg total) by mouth daily. Take first 2 tablets together, then 1 every day until finished. 07/07/23  Yes Sherian Maroon A, PA  predniSONE (DELTASONE) 20 MG tablet Take 2 tablets (40 mg total) by mouth daily with breakfast for 5 days. 07/07/23 07/12/23 Yes Sherian Maroon A, PA  Albuterol-Budesonide (AIRSUPRA) 90-80 MCG/ACT AERO Inhale 2 puffs into the lungs every 4 (four) hours as needed. 03/01/23   Parrett, Virgel Bouquet, NP  Azelastine-Fluticasone 137-50 MCG/ACT SUSP Place 2 puffs into the nose daily. 03/01/23   Parrett, Virgel Bouquet, NP  benzonatate (TESSALON) 100 MG capsule Take 1 capsule (100 mg total) by mouth every 8 (eight) hours. 07/05/23   Teressa Lower, PA-C  Cholecalciferol 50 MCG (2000 UT) TBDP Take 2,000 Units by mouth daily.    [provider]  clopidogrel (PLAVIX) 75 MG tablet Take 1  tablet (75 mg total) by mouth daily. 07/03/23   Van Clines, MD  Ferrous Sulfate (IRON PO) Take 65 mg by mouth daily.    [provider]  fexofenadine (ALLEGRA) 180 MG tablet TAKE 1 TABLET BY MOUTH EVERY DAY Patient taking differently: Take 180 mg by mouth as needed for allergies. 03/21/21   [name], MD  furosemide (LASIX) 20 MG tablet Take 1 tablet (20 mg total) by mouth daily. 01/11/23   Nahser, Deloris Ping, MD  hydroxychloroquine (PLAQUENIL) 200 MG tablet TAKE 2 TABLETS BY MOUTH EVERY DAY 04/17/23   Rice, Jamesetta Orleans, MD  lidocaine (XYLOCAINE) 2 % solution Use as directed 15 mLs in the mouth or throat as needed for mouth pain. 07/05/23   Honor Loh M, PA-C  naproxen sodium (ALEVE) 220 MG tablet Take 440 mg by mouth daily as needed (Headache).    [provider]  nitroGLYCERIN (NITROSTAT) 0.4 MG SL tablet Place 1 tablet (0.4 mg total) under the tongue every 5 (five) minutes as needed for chest pain. 01/11/23   Nahser, Deloris Ping, MD  potassium chloride (KLOR-CON) 10 MEQ tablet Take 1 tablet (10 mEq total) by mouth 2 (two) times daily. 01/26/23   Nahser, Deloris Ping, MD      Allergies    Egg-derived products and Lactose intolerance (gi)    Review of Systems   Review of Systems  Respiratory:  Positive for cough.   All other systems reviewed and are negative.   Physical Exam Updated Vital Signs BP 117/82 (BP Location: Right Arm)   Pulse 75  Temp 98.1 F (36.7 C)   Resp 16   SpO2 97%  Physical Exam Vitals and nursing note reviewed.  Constitutional:      General: She is not in acute distress.    Appearance: She is well-developed.  HENT:     Head: Normocephalic and atraumatic.  Eyes:     Conjunctiva/sclera: Conjunctivae normal.  Cardiovascular:     Rate and Rhythm: Normal rate and regular rhythm.  Pulmonary:     Effort: Pulmonary effort is normal. No respiratory distress.     Comments: Faint expiratory wheeze appreciated. Abdominal:     Palpations:  Abdomen is soft.     Tenderness: There is no abdominal tenderness.  Musculoskeletal:        General: No swelling.     Cervical back: Neck supple.     Right lower leg: No edema.     Left lower leg: No edema.  Skin:    General: Skin is warm and dry.     Capillary Refill: Capillary refill takes less than 2 seconds.  Neurological:     Mental Status: She is alert.  Psychiatric:        Mood and Affect: Mood normal.     ED Results / Procedures / Treatments   Labs (all labs ordered are listed, but only abnormal results are displayed) Labs Reviewed  GROUP A STREP BY PCR    EKG None  Radiology DG Chest 2 View Result Date: 07/07/2023 CLINICAL DATA:  Cough EXAM: CHEST - 2 VIEW COMPARISON:  06/22/2023 FINDINGS: The heart size and mediastinal contours are within normal limits. Slightly low lung volumes. Mild streaky right basilar opacity. Lungs are otherwise clear. No pleural effusion or pneumothorax. The visualized skeletal structures are unremarkable. IMPRESSION: Mild streaky right basilar opacity, favor atelectasis. Electronically Signed   By: Duanne Guess D.O.   On: 07/07/2023 13:06    Procedures Procedures    Medications Ordered in ED Medications - No data to display  ED Course/ Medical Decision Making/ A&P                                 Medical Decision Making Amount and/or Complexity of Data Reviewed Radiology: ordered.  Risk Prescription drug management.   This patient presents to the ED for concern of cough, congestion, this involves an extensive number of treatment options, and is a complaint that carries with it a high risk of complications and morbidity.  The differential diagnosis includes COVID, flu, RSV, pneumonia, sepsis, other   Co morbidities that complicate the patient evaluation  See HPI   Additional history obtained:  Additional history obtained from EMR External records from outside source obtained and reviewed including hospital  records   Lab Tests:  I Ordered, and personally interpreted labs.  The pertinent results include: Group A strep negative   Imaging Studies ordered:  I ordered imaging studies including chest x-ray I independently visualized and interpreted imaging which showed right lower opacity I agree with the radiologist interpretation   Cardiac Monitoring: / EKG:  The patient was maintained on a cardiac monitor.  I personally viewed and interpreted the cardiac monitored which showed an underlying rhythm of: Sinus rhythm   Consultations Obtained:  N/a   Problem List / ED Course / Critical interventions / Medication management  Cough, nasal congestion Reevaluation of the patient after these medicines showed that the patient stayed the same I have reviewed the patients home  medicines and have made adjustments as needed   Social Determinants of Health:  Denies tobacco, licit drug use.   Test / Admission - Considered:  Cough, nasal congestion Vitals signs within normal range and stable throughout visit. Laboratory/imaging studies significant for: See above 63 year old female presents emergency department complaints of cough, nasal congestion.  Symptom onset about a week ago.  Was in the emergency department and recommended over-the-counter medications with some improvement initially but due to continued cough, prior to visit emergency department.  On exam, faint expiratory wheeze appreciated on lung exam.  Chest x-ray concerning for right lower lobe opacity favored as atelectasis but due to patient's continued cough, suspicion for developing pneumonia.  Will treat with antibiotics in the outpatient setting. Will recommend close follow-up with PCP in the outpatient setting for reassessment.  Treatment plan discussed at length with patient and she knowledge understanding was agreeable to said plan.  Patient will well-appearing, afebrile in no acute distress. Worrisome signs and symptoms were  discussed with the patient, and the patient acknowledged understanding to return to the ED if noticed. Patient was stable upon discharge.          Final Clinical Impression(s) / ED Diagnoses Final diagnoses:  Subacute cough  Nasal congestion    Rx / DC Orders      Peter Garter, [place] 07/07/23 1325    Alvira Monday, MD 07/08/23 [phone]

## 2023-07-07 NOTE — Discharge Instructions (Signed)
 As discussed, your strep test was negative.  Chest x-ray with possible pneumonia.  Will place you on antibiotics as well as short course of prednisone.  Continue to take nasal sprays as prescribed by your pulmonologist.  Recommend close follow-up with your primary care for reassessment.  Please do not hesitate to return if the worrisome signs and symptoms we discussed become apparent.

## 2023-07-12 NOTE — Progress Notes (Signed)
 Office Visit Note  Patient: Deanna [name]             Date of Birth: [birth year]           MRN: [id number]             PCP: [name], MD Referring: [name], MD Visit Date: 07/25/2023   Subjective:  Follow-up (Patient states she just got over the flu and it hit her hard. Patient states she has went to the emergency room twice. )   Discussed the use of AI scribe software for clinical note transcription with the patient, who gave verbal consent to proceed.  History of Present Illness   Deanna [name] is a 63 y.o. female here for follow up for lupus on hydroxychloroquine 400 mg daily.  The patient presents with respiratory symptoms following a recent flu infection.  She tested positive for the flu on the fifteenth of last month, after which her condition deteriorated significantly over the course of a week. She experienced severe coughing, which led to a raw throat, prompting a visit to the emergency room where she received medication to numb her throat and cough medicine. Despite this, her symptoms worsened by the following Saturday, leading to another emergency room visit where x-rays were performed to rule out pneumonia. She was treated with prednisone and antibiotics, which gradually improved her condition over the next week.  Concurrent with her respiratory symptoms, she experienced a sinus infection characterized by facial pain. She has a history of sinus infections following illness, which she discussed with an ENT specialist, who did not attribute it to her previous sinus surgery. During this period, she experienced epistaxis for about three days.  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 She is currently taking Plaquenil without issues. She reports dry skin, which she manages with oils and lotions, though it remains problematic. She also notes dental issues, with  significant decay in her back teeth, and has an upcoming dental appointment. She recently visited an eye doctor due to seeing flashes of light and was advised to continue using eye drops, which have helped alleviate the symptoms.    Previous HPI 01/31/2023 Deanna [name] is a 63 y.o. female here for follow up for lupus on hydroxychloroquine 400 mg daily.  No particular exacerbation of joint pain or swelling since the last visit. Has slight leg swelling. She still having trouble with increased dyspnea on exertion.  She underwent TEE and right heart catheterization earlier this month.  She was started on furosemide and potassium supplementation but just began this 1 week ago.  She had ophthalmology exam in August with no problems found.   Previous HPI 10/25/22 Deanna [name] is a 63 y.o. female here for follow up for lupus on hydroxychloroquine 400 mg daily.  Since last visit she has had a lot of medical events.  She was sick with upper respiratory infection symptoms for about 2 months and developed pleuritic chest pain imaging consistent with pleurisy as well.  Subsequently developed arm weakness and was found to have small stroke.  Did not have specific precipitating cause identified on workup and is doing well without significant residual afterwards.  She just had evaluation for some increase in right knee swelling a bit worse than usual.  Also having some tenderness in her hands around PIP joints without any visible changes.   Previous HPI 04/26/22 [name]  Deanna [name] is a 63 y.o. 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  Since her last visit her lateral hip pain is doing better.  She took a Flexeril for short time which was beneficial but noticed a lot of drowsiness or loopy sensation side effects so stopped taking this by now.  Biggest issue currently is her sleep.  Has a lot of fatigue and increase in stiffness associated with fragmented  sleep.  She wakes up by 3 AM every morning and cannot fall back asleep or remaining asleep.  She is not taking naps during the day and is trying to follow a consistent sleep schedule without much improvement.  She has known OSA and reports consistent use of her CPAP.   Previous HPI 01/25/22 Deanna [name] is a 63 y.o. female here for follow up For lupus/RA on HCQ 400 mg daily.  Systemic symptoms are doing pretty well but is having increased trouble with pain in her low back and right leg.  Pain going down the lateral side of the leg from the hip down to the knee.  She is not seeing any visible swelling or abnormal appearance in the affected area.  She is not having any falls or severe mobility limitation.     Previous HPI 10/27/2021 Deanna [name] is a 63 y.o. 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 She was doing fairly well off prednisone after last visit. She had a good vacation trip to [place] in March. However afterwards was sick with COVID without serious complications. 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 She has had 3 sinus infections so far this year. She saw ENT late last year due to recurrent sinusitis problems. During these illnesses she has an increase in hand and wrist pains. Most recently developed burning type of pain affecting along her hairline and low back area. This comes and goes no visible changes associated. She noticed some hair loss and thinning at multiple areas during this time. She has tried avoiding any chemical exposures to her hair and scalp.    Previous HPI 04/18/21 Deanna [name] is a 63 y.o. female here for follow up for lupus on HCQ 400 mg PO daily and tapering off prednisone. She has had ongoing upper respiratory problems since our last visit with recurrent ear infection and upper respiratory infections most of the past 3 months. She  has taken several rounds of antibiotics with clearance but quick return of symptoms. She was treated with high dose prednisone once after visiting to the emergency department. She has experienced bleeding and ulceration in the nose. She has also suffered spontaneous bruising in one digit for about 2 days at a time with spontaneous resolution. She had past ENT surgery for septum defect but has not had chronic problems until all this started again after our last visit. She has noticed a small increased in lower extremity swelling with deeper sock band indentations, no discoloration or pain or significant weight change. She had her HCQ eye exam with [facility] okay to continue HCQ but they are planning to follow up in 2-4 weeks.   Previous HPI 01/11/21 Yuval Nolet is a 63 y.o. female here for follow up for lupus after starting HCQ 400 mg PO daily and prednisone 5 mg PO daily.  Overall doing well on this but  has continued the low-dose steroids.  She has not noticed problems with taking the medication.   09/07/20 Payden Emmalyne Giacomo is a 63 y.o. female here for evaluation of positive ANA with fatigue and myalgias. Symptoms are persistent since last year with fatigue, pains in her hands and ankles and stiffness but she has had some joint pains going back for years. She typically notices swelling at the ankles by the end of the day. This is moderately painful but not debilitating. Her hand pain is worst with use during the day and she notices swelling when eating a lot of salt. 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 She does experience raynaud's symptoms with pallor in fingertips without any lesions ulcers or injuries. She denies problems with mouth and eye dryness, lymphadenopathy, chest pain, skin rashes, or photosensitive   [facility] Labs reviewed ANA 1:640 speckled CB-CAP EC4d 16 CB-CAP BC4d 90   Lupus  manifestations Fatigue Arthralgia Raynaud's ?Spontaneous bruising Hx of TTP s/p splenectomy (primary vs secondary?)   Labs reviewed ANA pos Scl-70 1.3   Review of Systems  Constitutional:  Positive for fatigue.  HENT:  Negative for mouth sores and mouth dryness.   Eyes:  Positive for dryness.  Respiratory:  Positive for shortness of breath.   Cardiovascular:  Negative for chest pain and palpitations.  Gastrointestinal:  Negative for blood in stool, constipation and diarrhea.  Endocrine: Negative for increased urination.  Genitourinary:  Negative for involuntary urination.  Musculoskeletal:  Positive for myalgias and myalgias. Negative for joint pain, gait problem, joint pain, joint swelling, muscle weakness, morning stiffness and muscle tenderness.  Skin:  Positive for sensitivity to sunlight. Negative for color change, rash and hair loss.  Allergic/Immunologic: Positive for susceptible to infections.  Neurological:  Negative for dizziness and headaches.  Hematological:  Negative for swollen glands.  Psychiatric/Behavioral:  Positive for sleep disturbance. Negative for depressed mood. The patient is nervous/anxious.     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 pylori infection 12/21/2016   Anemia 12/12/2016    Abnormal brain MRI 09/25/2016   Numbness and tingling in left hand 09/25/2016   Cervical radiculopathy at C5 09/25/2016   Neck pain 09/25/2016   Aortic valve sclerosis 08/07/2016   Aortic root dilatation (HCC) 08/07/2016   Ear pain 07/22/2016   Hyperlipemia 12/02/2015   H/O splenectomy 12/02/2015   Hyperglycemia 01/27/2014   Obesity 01/27/2014   Vitamin D deficiency 09/11/2012    Past Medical History:  Diagnosis Date   Allergy    GERD (gastroesophageal reflux disease)    "does not need meds anymore"   Heart murmur    Hyperlipidemia    Lactose intolerance    Lupus    Mini stroke    OSA (obstructive sleep apnea)  Positive ANA (antinuclear antibody)    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HCC)     Family History  Problem Relation Age of Onset   Arthritis Mother    Hyperlipidemia Mother    Stroke Mother    Hypertension Mother    Hypertension Father    Lupus Maternal Aunt    Breast cancer Daughter    Lupus Son    Colon cancer Neg Hx    Past Surgical History:  Procedure Laterality Date   ABDOMINAL HYSTERECTOMY  2006   benign tumor, complete   AORTIC ARCH ANGIOGRAPHY N/A 01/19/2023   Procedure: AORTIC ARCH ANGIOGRAPHY;  Surgeon: Kathleene Hazel, MD;  Location: [facility]IVE [facility];  Service: Cardiovascular;  Laterality: N/A;   KNEE ARTHROSCOPY Right    NASAL SEPTUM SURGERY     RIGHT HEART CATH AND CORONARY ANGIOGRAPHY N/A 01/19/2023   Procedure: RIGHT HEART CATH AND CORONARY ANGIOGRAPHY;  Surgeon: Kathleene Hazel, MD;  Location: [facility];  Service: Cardiovascular;  Laterality: N/A;   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   TEE WITHOUT CARDIOVERSION N/A 01/19/2023   Procedure: TRANSESOPHAGEAL ECHOCARDIOGRAM;  Surgeon: Wendall Stade, MD;  Location: [facility] INVASIVE CV LAB;  Service: Cardiovascular;  Laterality: N/A;   Social History   Social History Narrative   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 Situation:  lives 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, husband and mother      Spiritual Beliefs: Christian      Lifestyle: walks a little a few days per week, diet is ok      Right Handed      Drinks Caffeine      Lupus and RA 01-19-2023         Immunization History  Administered Date(s) Administered   Meningococcal B, OMV 12/02/2015   PFIZER Comirnaty(Gray Top)Covid-19 Tri-Sucrose Vaccine 07/19/2019, 08/09/2019, 06/16/2020   PPD Test 01/21/2018   Pneumococcal Polysaccharide-23 10/14/2010, 10/21/2014     Objective: Vital Signs: BP 115/75 (BP Location: Left Arm, Patient Position: Sitting, Cuff Size: Large)   Pulse 66   Resp 14   Ht 5\' 9"  (1.753 m)   Wt 230 lb (104.3 kg)   BMI 33.97 kg/m    Physical Exam HENT:     Mouth/Throat:     Mouth: Mucous membranes are moist.     Pharynx: Oropharynx is clear.  Eyes:     Conjunctiva/sclera: Conjunctivae normal.  Cardiovascular:     Rate and Rhythm: Normal rate and regular rhythm.     Heart sounds: Murmur heard.  Pulmonary:     Effort: Pulmonary effort is normal.     Breath sounds: Normal breath sounds.  Musculoskeletal:     Right lower leg: No edema.     Left lower leg: No edema.  Lymphadenopathy:     Cervical: No cervical adenopathy.  Skin:    General: Skin is warm and dry.     Findings: No rash.  Neurological:     Mental Status: She is alert.  Psychiatric:        Mood and Affect: Mood normal.      Musculoskeletal Exam:  Neck full ROM no tenderness Shoulders full ROM no tenderness or swelling Elbows full ROM no tenderness or swelling Wrists full ROM no tenderness or swelling Fingers full ROM no tenderness or swelling No hip pain with internal and external rotation, no lateral tenderness to pressure Knees full ROM with patellofemoral crepitus b/l, no palpable effusions  Investigation: No additional findings.  Imaging: DG Chest 2 View Result Date: 07/07/2023 CLINICAL DATA:  Cough EXAM: CHEST - 2 VIEW COMPARISON:  06/22/2023 FINDINGS: The  heart size and mediastinal contours are within normal limits. Slightly low lung volumes. Mild streaky right basilar opacity. Lungs are otherwise clear. No pleural effusion or pneumothorax. The visualized skeletal structures are unremarkable. IMPRESSION: Mild streaky right basilar opacity, favor atelectasis. Electronically Signed   By: Duanne Guess D.O.   On: 07/07/2023 13:06    Recent Labs: Lab Results  Component Value Date   WBC 6.4 07/25/2023   HGB 10.1 (L) 07/25/2023   PLT 537 (H) 07/25/2023   NA 144 01/19/2023   K 3.6 01/19/2023   CL 105 01/16/2023   CO2 24 01/16/2023   GLUCOSE 72 01/16/2023   BUN 16 01/16/2023   CREATININE 0.89 01/16/2023   BILITOT 0.9 12/06/2022   ALKPHOS 70 12/06/2022   AST 16 12/06/2022   ALT 13 12/06/2022   PROT 8.3 12/06/2022   ALBUMIN 4.4 12/06/2022   CALCIUM 9.8 01/16/2023   GFRAA >60 11/27/2019   QFTBGOLDPLUS NEGATIVE 01/11/2021    Speciality Comments: PLQ eye exam 01/10/2023 Normal  @ [facility] f/u 6 months  Procedures:  No procedures performed Allergies: Egg-derived products and Lactose intolerance (gi)   Assessment / Plan:     Visit Diagnoses: Undifferentiated connective tissue disease (HCC) - Plan: Sedimentation rate Symptoms appear well-controlled no peripheral joint synovitis appreciable on exam today.  No complaints of increased Raynaud's symptoms.  Not requiring regular use of oral anti-inflammatories.  Did have 2 rounds of steroids recently associated with upper respiratory infection. -Checking sedimentation rate for disease activity monitoring -Continue hydroxychloroquine 400 mg daily  High risk medication use - hydroxychloroquine 400 mg daily, PLQ eye exam 01/10/2023 Normal - Plan: CBC with Differential/Platelet, COMPLETE METABOLIC PANEL WITH GFR Tolerating medication well only significant illness with recent influenza with close of recovery.  Not requiring any other use of antibiotics.  She had recent  ophthalmology follow-up with no reported retinal toxicity. -Checking CBC and CMP for medication monitoring on continued long-term use of hydroxychloroquine  Sinusitis Experienced sinus infection symptoms concurrent with influenza. Recurrent sinusitis following illness, with history of sinus surgery. Epistaxis noted, possibly related to sinusitis or dry nasal passages. - Advise on humidification and nasal saline sprays to prevent dryness.  Xerosis Severe dry skin, particularly on legs, worsened in winter. Current use of oils and lotions ineffective. Dry skin may contribute to dental issues due to dry mouth. - Continue using emollients and moisturizers.  Dental decay Significant dental decay possibly related to dry mouth from autoimmune condition. Upcoming dental appointment. Saliva production appears normal, but dryness could contribute to dental issues. - Discuss with dentist the potential link between dry mouth and dental decay. - Consider medications to increase saliva production if recommended by dentist.  Ocular symptoms Reports seeing flashes of light. Advised by ophthalmologist to continue using eye drops. Symptoms have calmed down. No abnormal findings on exam today. - Continue using prescribed eye drops. - Follow up with ophthalmologist if symptoms worsen.   Orders: Orders Placed This Encounter  Procedures   Sedimentation rate   CBC with Differential/Platelet   COMPLETE METABOLIC PANEL WITH GFR   No orders of the defined types were placed in this encounter.    Follow-Up Instructions: Return in about 6 months (around 01/25/2024) for UCTD on HCQ f/u 6mos.   Fuller Plan, MD  Note - This record has been created  using Animal nutritionist.  Chart creation errors have been sought, but may not always  have been located. Such creation errors do not reflect on  the standard of medical care.

## 2023-07-25 ENCOUNTER — Ambulatory Visit: Payer: Self-pay | Attending: Internal Medicine | Admitting: Internal Medicine

## 2023-07-25 ENCOUNTER — Encounter: Payer: Self-pay | Admitting: Internal Medicine

## 2023-07-25 VITALS — BP 115/75 | HR 66 | Resp 14 | Ht 69.0 in | Wt 230.0 lb

## 2023-07-25 DIAGNOSIS — Z79899 Other long term (current) drug therapy: Secondary | ICD-10-CM | POA: Diagnosis not present

## 2023-07-25 DIAGNOSIS — M359 Systemic involvement of connective tissue, unspecified: Secondary | ICD-10-CM | POA: Diagnosis not present

## 2023-07-25 DIAGNOSIS — I358 Other nonrheumatic aortic valve disorders: Secondary | ICD-10-CM | POA: Diagnosis not present

## 2023-07-25 DIAGNOSIS — I7300 Raynaud's syndrome without gangrene: Secondary | ICD-10-CM | POA: Diagnosis not present

## 2023-07-25 DIAGNOSIS — M67442 Ganglion, left hand: Secondary | ICD-10-CM

## 2023-07-26 LAB — COMPLETE METABOLIC PANEL WITH GFR
AG Ratio: 1.4 (calc) (ref 1.0–2.5)
ALT: 15 U/L (ref 6–29)
AST: 14 U/L (ref 10–35)
Albumin: 4.1 g/dL (ref 3.6–5.1)
Alkaline phosphatase (APISO): 61 U/L (ref 37–153)
BUN: 13 mg/dL (ref 7–25)
CO2: 28 mmol/L (ref 20–32)
Calcium: 9.6 mg/dL (ref 8.6–10.4)
Chloride: 106 mmol/L (ref 98–110)
Creat: 0.84 mg/dL (ref 0.50–1.05)
Globulin: 2.9 g/dL (ref 1.9–3.7)
Glucose, Bld: 82 mg/dL (ref 65–99)
Potassium: 4.3 mmol/L (ref 3.5–5.3)
Sodium: 140 mmol/L (ref 135–146)
Total Bilirubin: 0.6 mg/dL (ref 0.2–1.2)
Total Protein: 7 g/dL (ref 6.1–8.1)
eGFR: 80 mL/min/{1.73_m2} (ref >=60)

## 2023-07-26 LAB — CBC WITH DIFFERENTIAL/PLATELET
Absolute Lymphocytes: 2266 {cells}/uL (ref 850–3900)
Absolute Monocytes: 851 {cells}/uL (ref 200–950)
Basophils Absolute: 38 {cells}/uL (ref 0–200)
Basophils Relative: 0.6 %
Eosinophils Absolute: 109 {cells}/uL (ref 15–500)
Eosinophils Relative: 1.7 %
HCT: 31 % — ABNORMAL LOW (ref 35.0–45.0)
Hemoglobin: 10.1 g/dL — ABNORMAL LOW (ref 11.7–15.5)
MCH: 28.5 pg (ref 27.0–33.0)
MCHC: 32.6 g/dL (ref 32.0–36.0)
MCV: 87.3 fL (ref 80.0–100.0)
MPV: 10.5 fL (ref 7.5–12.5)
Monocytes Relative: 13.3 %
Neutro Abs: 3136 {cells}/uL (ref 1500–7800)
Neutrophils Relative %: 49 %
Platelets: 537 10*3/uL — ABNORMAL HIGH (ref 140–400)
RBC: 3.55 10*6/uL — ABNORMAL LOW (ref 3.80–5.10)
RDW: 13.2 % (ref 11.0–15.0)
Total Lymphocyte: 35.4 %
WBC: 6.4 10*3/uL (ref 3.8–10.8)

## 2023-07-26 LAB — SEDIMENTATION RATE: Sed Rate: 39 mm/h — ABNORMAL HIGH (ref 0–30)

## 2023-07-27 ENCOUNTER — Other Ambulatory Visit: Payer: Self-pay | Admitting: Internal Medicine

## 2023-07-27 DIAGNOSIS — I7300 Raynaud's syndrome without gangrene: Secondary | ICD-10-CM

## 2023-07-27 NOTE — Progress Notes (Signed)
 Sed rate is mildly elevated at 39 and platelet count is high at 537 these usually show an ongoing inflammatory response.  This might still be related to the upper respiratory symptoms.  If symptoms still do not resolve more within the next few weeks we can consider retreating with a lower dose of steroids.

## 2023-07-27 NOTE — Telephone Encounter (Signed)
 Last Fill: 04/17/2023  Eye exam: 01/10/2023   Labs: 07/25/2023 RBC 3.55, Hemoglobin 10.1, HCT 31.0, Platelets 537  Next Visit: 01/28/2024  Last Visit: 07/25/2023  [id number] connective tissue disease   Current Dose per office note 07/25/2023: hydroxychloroquine 400 mg daily   Okay to refill Plaquenil?

## 2023-07-31 ENCOUNTER — Ambulatory Visit: Payer: Managed Care, Other (non HMO) | Admitting: Neurology

## 2023-08-06 ENCOUNTER — Other Ambulatory Visit: Payer: Self-pay | Admitting: Cardiovascular Disease

## 2023-08-06 DIAGNOSIS — I351 Nonrheumatic aortic (valve) insufficiency: Secondary | ICD-10-CM

## 2023-08-06 DIAGNOSIS — Z79899 Other long term (current) drug therapy: Secondary | ICD-10-CM

## 2023-08-06 DIAGNOSIS — R079 Chest pain, unspecified: Secondary | ICD-10-CM

## 2023-08-06 DIAGNOSIS — Z01812 Encounter for preprocedural laboratory examination: Secondary | ICD-10-CM

## 2023-08-06 DIAGNOSIS — E785 Hyperlipidemia, unspecified: Secondary | ICD-10-CM

## 2023-08-06 DIAGNOSIS — R0609 Other forms of dyspnea: Secondary | ICD-10-CM

## 2023-08-07 ENCOUNTER — Telehealth: Payer: Self-pay | Admitting: *Deleted

## 2023-08-07 DIAGNOSIS — M359 Systemic involvement of connective tissue, unspecified: Secondary | ICD-10-CM

## 2023-08-07 MED ORDER — PREDNISONE 5 MG PO TABS: 5.0000 mg | ORAL_TABLET | Freq: Every day | ORAL | 0 refills | Status: DC | PRN

## 2023-08-07 NOTE — Addendum Note (Signed)
 Addended by: Fuller Plan on: 08/07/2023 04:45 PM   Modules accepted: Orders

## 2023-08-07 NOTE — Telephone Encounter (Signed)
 Left message to advise patient prescription for the prednisone 5 mg tablets can start if having increased symptoms like she had with previous infection.

## 2023-08-07 NOTE — Telephone Encounter (Signed)
 Patient contacted the office stating she has been exposed to Strep and to Covid. Patient states she would like to get ahead of the game and is requesting to be placed on the low dose Prednisone. Patient would like that sent to [facility].

## 2023-08-07 NOTE — Telephone Encounter (Signed)
 I am sending a prescription for the prednisone 5 mg tablets can start if having increased symptoms like she had with previous infection.

## 2023-09-05 ENCOUNTER — Ambulatory Visit (INDEPENDENT_AMBULATORY_CARE_PROVIDER_SITE_OTHER): Payer: Self-pay | Admitting: Dermatology

## 2023-09-05 ENCOUNTER — Encounter: Payer: Self-pay | Admitting: Dermatology

## 2023-09-05 VITALS — BP 128/81 | HR 72

## 2023-09-05 DIAGNOSIS — L821 Other seborrheic keratosis: Secondary | ICD-10-CM | POA: Diagnosis not present

## 2023-09-05 NOTE — Progress Notes (Signed)
   New Patient Visit   Subjective  Deanna [name] is a 63 y.o. female who presents for the following: growths  Patient states she has SKs located at the face and abdomen that she would like to have examined. Patient reports the areas have been there for 1 year. She reports the areas are bothersome. Some of the areas located on her face will burn. Patient rates irritation 3 out of 10. She states that the areas have spread. Patient reports she has not previously been treated for these areas. Patient reports Hx of bx (benign spot removed RLE). Patient denies family history of skin cancer(s).  The patient has spots, moles and lesions to be evaluated, some may be new or changing and the patient may have concern these could be cancer.  The following portions of the chart were reviewed this encounter and updated as appropriate: medications, allergies, medical history  Review of Systems:  No other skin or systemic complaints except as noted in HPI or Assessment and Plan.  Objective  Well appearing patient in no apparent distress; mood and affect are within normal limits.  A focused examination was performed of the following areas: Face and Right Abdomen  Relevant exam findings are noted in the Assessment and Plan.                         Assessment & Plan   SEBORRHEIC KERATOSIS - Assessment: Multiple brown, superficial growths on the face, neck, and abdomen, consistent with seborrheic keratoses. Gradually appearing and growing over the past year. Largest lesions on the face, with recent development of new lesions. One lesion on the abdomen has partially fallen off, leaving a white border. No personal or family history of skin cancer. Benign growths with no malignant potential. Patient reports no symptoms of itching or irritation from these lesions.  - Plan:    No immediate intervention required as lesions are asymptomatic and benign    Provided patient education on the benign  nature of seborrheic keratoses    Informed patient about the option of liquid nitrogen cryotherapy for removal of 1-2 symptomatic lesions if they become irritated or problematic in the future    Recommended exfoliating wash to potentially slow the formation of new lesions:     - Provided samples of La Roche-Posay Awash with salicylic acid     - Instructed to use every other night    Addressed dry skin concerns:     - Provided samples of La Roche-Posay Double Repair moisturizer for daily use     - Provided samples of Cicoplast Balm for aggressive hydration as needed    Patient instructed to return if any lesions become symptomatic or concerning     Return if symptoms worsen or fail to improve, for SK F/U.  I, Jetta Ager, am acting as Neurosurgeon for [employer], DO.  Documentation: I have reviewed the above documentation for accuracy and completeness, and I agree with the above.  Louana Roup, DO

## 2023-09-05 NOTE — Patient Instructions (Addendum)
 Hello Deanna [name],  Thank you for visiting today. Here is a summary of the key instructions:  - Skin Care Routine:   - Use the Effaclur by Anise Kerns every other night to exfoliate   - Rinse off Awash thoroughly to avoid sun sensitivity   - Apply Double Repair Moisturizer in the morning   - For extra hydration, apply Cicoplast Balm on top of moisturizer at night  - Follow-up Instructions:   - Return if any seborrheic keratoses become irritated or symptomatic   - Freezing treatment available for 1-2 irritated growths if needed  We look forward to seeing you at your next visit. If you have any questions or concerns before then, please do not hesitate to contact our office.  Warm regards,  Dr. Louana Roup [facility]      Important Information   Due to recent changes in healthcare laws, you may see results of your pathology and/or laboratory studies on MyChart before the doctors have had a chance to review them. We understand that in some cases there may be results that are confusing or concerning to you. Please understand that not all results are received at the same time and often the doctors may need to interpret multiple results in order to provide you with the best plan of care or course of treatment. Therefore, we ask that you please give us  2 business days to thoroughly review all your results before contacting the office for clarification. Should we see a critical lab result, you will be contacted sooner.     If You Need Anything After Your Visit   If you have any questions or concerns for your doctor, please call our main line at [phone]. If no one answers, please leave a voicemail as directed and we will return your call as soon as possible. Messages left after 4 pm will be answered the following business day.    You may also send us  a message via MyChart. We typically respond to MyChart messages within 1-2 business days.  For prescription refills, please ask your  pharmacy to contact our office. Our fax number is [phone].  If you have an urgent issue when the clinic is closed that cannot wait until the next business day, you can page your doctor at the number below.     Please note that while we do our best to be available for urgent issues outside of office hours, we are not available 24/7.    If you have an urgent issue and are unable to reach us , you may choose to seek medical care at your doctor's office, retail clinic, urgent care center, or emergency room.   If you have a medical emergency, please immediately call 911 or go to the emergency department. In the event of inclement weather, please call our main line at [phone] for an update on the status of any delays or closures.  Dermatology Medication Tips: Please keep the boxes that topical medications come in in order to help keep track of the instructions about where and how to use these. Pharmacies typically print the medication instructions only on the boxes and not directly on the medication tubes.   If your medication is too expensive, please contact our office at [phone] or send us  a message through MyChart.    We are unable to tell what your co-pay for medications will be in advance as this is different depending on your insurance coverage. However, we may be able to find a substitute medication at  lower cost or fill out paperwork to get insurance to cover a needed medication.    If a prior authorization is required to get your medication covered by your insurance company, please allow us  1-2 business days to complete this process.   Drug prices often vary depending on where the prescription is filled and some pharmacies may offer cheaper prices.   The website www.goodrx.com contains coupons for medications through different pharmacies. The prices here do not account for what the cost may be with help from insurance (it may be cheaper with your insurance), but the website can  give you the price if you did not use any insurance.  - You can print the associated coupon and take it with your prescription to the pharmacy.  - You may also stop by our office during regular business hours and pick up a GoodRx coupon card.  - If you need your prescription sent electronically to a different pharmacy, notify our office through [facility] or by phone at [phone]

## 2023-09-08 ENCOUNTER — Emergency Department (HOSPITAL_BASED_OUTPATIENT_CLINIC_OR_DEPARTMENT_OTHER)
Admission: EM | Admit: 2023-09-08 | Discharge: 2023-09-08 | Disposition: A | Discharge status: Home / Self Care | Attending: Emergency Medicine | Admitting: Emergency Medicine

## 2023-09-08 ENCOUNTER — Encounter (HOSPITAL_BASED_OUTPATIENT_CLINIC_OR_DEPARTMENT_OTHER): Payer: Self-pay

## 2023-09-08 ENCOUNTER — Other Ambulatory Visit: Payer: Self-pay

## 2023-09-08 DIAGNOSIS — Z7902 Long term (current) use of antithrombotics/antiplatelets: Secondary | ICD-10-CM | POA: Insufficient documentation

## 2023-09-08 DIAGNOSIS — R42 Dizziness and giddiness: Secondary | ICD-10-CM | POA: Insufficient documentation

## 2023-09-08 DIAGNOSIS — M2550 Pain in unspecified joint: Secondary | ICD-10-CM | POA: Insufficient documentation

## 2023-09-08 DIAGNOSIS — G4733 Obstructive sleep apnea (adult) (pediatric): Secondary | ICD-10-CM | POA: Insufficient documentation

## 2023-09-08 DIAGNOSIS — D649 Anemia, unspecified: Secondary | ICD-10-CM | POA: Insufficient documentation

## 2023-09-08 DIAGNOSIS — R55 Syncope and collapse: Secondary | ICD-10-CM | POA: Diagnosis not present

## 2023-09-08 DIAGNOSIS — R600 Localized edema: Secondary | ICD-10-CM | POA: Insufficient documentation

## 2023-09-08 DIAGNOSIS — R5383 Other fatigue: Secondary | ICD-10-CM | POA: Diagnosis present

## 2023-09-08 LAB — TROPONIN T, HIGH SENSITIVITY
Troponin T High Sensitivity: <15 ng/L (ref <19)
Troponin T High Sensitivity: <15 ng/L (ref <19)

## 2023-09-08 LAB — CBC WITH DIFFERENTIAL/PLATELET
Abs Immature Granulocytes: 0.01 10*3/uL (ref 0.00–0.07)
Basophils Absolute: 0.1 10*3/uL (ref 0.0–0.1)
Basophils Relative: 1 %
Eosinophils Absolute: 0.2 10*3/uL (ref 0.0–0.5)
Eosinophils Relative: 4 %
HCT: 34.2 % — ABNORMAL LOW (ref 36.0–46.0)
Hemoglobin: 11.2 g/dL — ABNORMAL LOW (ref 12.0–15.0)
Immature Granulocytes: 0 %
Lymphocytes Relative: 37 %
Lymphs Abs: 2 10*3/uL (ref 0.7–4.0)
MCH: 29.2 pg (ref 26.0–34.0)
MCHC: 32.7 g/dL (ref 30.0–36.0)
MCV: 89.3 fL (ref 80.0–100.0)
Monocytes Absolute: 0.8 10*3/uL (ref 0.1–1.0)
Monocytes Relative: 14 %
Neutro Abs: 2.4 10*3/uL (ref 1.7–7.7)
Neutrophils Relative %: 44 %
Platelets: 374 10*3/uL (ref 150–400)
RBC: 3.83 MIL/uL — ABNORMAL LOW (ref 3.87–5.11)
RDW: 14 % (ref 11.5–15.5)
Smear Review: NORMAL
WBC: 5.6 10*3/uL (ref 4.0–10.5)
nRBC: 0 % (ref 0.0–0.2)

## 2023-09-08 LAB — COMPREHENSIVE METABOLIC PANEL WITH GFR
ALT: 16 U/L (ref 0–44)
AST: 21 U/L (ref 15–41)
Albumin: 4.1 g/dL (ref 3.5–5.0)
Alkaline Phosphatase: 74 U/L (ref 38–126)
Anion gap: 14 (ref 5–15)
BUN: 13 mg/dL (ref 6–20)
CO2: 22 mmol/L (ref 22–32)
Calcium: 9.3 mg/dL (ref 8.9–10.3)
Chloride: 105 mmol/L (ref 98–111)
Creatinine, Ser: 0.93 mg/dL (ref 0.44–1.00)
GFR, Estimated: >60 mL/min (ref >60)
Glucose, Bld: 80 mg/dL (ref 70–99)
Potassium: 4.1 mmol/L (ref 3.5–5.1)
Sodium: 141 mmol/L (ref 135–145)
Total Bilirubin: 0.7 mg/dL (ref 0.0–1.2)
Total Protein: 7.4 g/dL (ref 6.5–8.1)

## 2023-09-08 LAB — CBG MONITORING, ED: Glucose-Capillary: 84 mg/dL (ref 70–99)

## 2023-09-08 NOTE — Discharge Instructions (Signed)
Contact a health care provider if: You continue to have episodes of near fainting. Get help right away if: You faint. You have any of these symptoms that may indicate trouble with your heart: Fast or irregular heartbeats (palpitations). Unusual pain in your chest, abdomen, or back. Shortness of breath. You have a seizure. You have a severe headache. You are confused. You have vision problems. You have severe weakness or trouble walking. You are bleeding from your mouth or rectum, or have black or tarry stool. These symptoms may represent a serious problem that is an emergency. Do not wait to see if your symptoms will go away. Get medical help right away. Call your local emergency services (911 in the [place] Do not drive yourself to the hospital.

## 2023-09-08 NOTE — ED Provider Notes (Addendum)
 [facility]GENC[facility]PARTMENT AT [facility] Provider Note   CSN: [id number] Arrival date & time: 09/08/23  1010     History  Chief Complaint  Patient presents with   Dizziness   Joint Swelling    Deanna Deanna [name] is a 63 y.o. female with a past medical history of lupus, aortic root dilatation, OSA, Raynaud's, previous splenectomy who presents emergency department with chief complaint of bodyaches and near syncope.  Patient reports that she normally has some joint pain and stiffness however Thursday she began feeling really tired and exhausted with increased body aches and pain.  She denies urinary symptoms fevers chills cough or other signs of infection.  She states that she spent most of the day Saturday and Sunday just laying in on her couch watching TV.  She took Aleve which was helpful for her symptoms.  She states that every time she stood up she started feeling lightheaded she also noticed some increased swelling in her lower extremities she denies chest pain or shortness of breath but did have some chest discomfort about a week and a half ago that went away after about 2 days.  She is also noticed increased bruising with multiple coin sized lesion on her legs.  She is not sure how she got these.  She denies melena or hematochezia she is on clopidogrel .   Dizziness      Home Medications Prior to Admission medications   Medication Sig Start Date End Date Taking? Authorizing Provider  Albuterol-Budesonide (AIRSUPRA ) 90-80 MCG/ACT AERO Inhale 2 puffs into the lungs every 4 (four) hours as needed. 03/01/23   Parrett, Macdonald Savoy, NP  amoxicillin -clavulanate (AUGMENTIN ) 875-125 MG tablet Take 1 tablet by mouth every 12 (twelve) hours. Patient not taking: Reported on 09/05/2023 07/07/23   Neil Balls A, PA  Azelastine -Fluticasone  137-50 MCG/ACT SUSP Deanna [name] 2 puffs into the nose daily. 03/01/23   Parrett, Macdonald Savoy, NP  azithromycin  (ZITHROMAX ) 250 MG tablet Take 1 tablet  (250 mg total) by mouth daily. Take first 2 tablets together, then 1 every day until finished. Patient not taking: Reported on 09/05/2023 07/07/23   Neil Balls A, PA  benzonatate  (TESSALON ) 100 MG capsule Take 1 capsule (100 mg total) by mouth every 8 (eight) hours. Patient not taking: Reported on 09/05/2023 07/05/23   Darletta Ehrich, PA-C  Cholecalciferol 50 MCG (2000 UT) TBDP Take 2,000 Units by mouth daily.    [provider]  clopidogrel  (PLAVIX ) 75 MG tablet Take 1 tablet (75 mg total) by mouth daily. 07/03/23   Jhonny Moss, MD  ELDERBERRY PO Take by mouth.    [provider]  Ferrous Sulfate (IRON PO) Take 65 mg by mouth daily.    [provider]  fexofenadine  (ALLEGRA ) 180 MG tablet TAKE 1 TABLET BY MOUTH EVERY DAY Patient taking differently: Take 180 mg by mouth as needed for allergies. 03/21/21   Viola Greulich, MD  furosemide  (LASIX ) 20 MG tablet TAKE 1 TABLET BY MOUTH EVERY DAY 08/08/23   Nahser, Lela Purple, MD  hydroxychloroquine  (PLAQUENIL ) 200 MG tablet TAKE 2 TABLETS BY MOUTH EVERY DAY 07/27/23   Rice, Haig Levan, MD  lidocaine  (XYLOCAINE ) 2 % solution Use as directed 15 mLs in the mouth or throat as needed for mouth pain. Patient not taking: Reported on 09/05/2023 07/05/23   Darletta Ehrich, PA-C  naproxen sodium (ALEVE) 220 MG tablet Take 440 mg by mouth daily as needed (Headache).    [provider]  nitroGLYCERIN  (NITROSTAT ) 0.4 MG SL tablet Deanna [name] 1 tablet (0.4 mg total) under the tongue every 5 (five) minutes as needed for chest pain. 01/11/23   Nahser, Lela Purple, MD  potassium chloride  (KLOR-CON ) 10 MEQ tablet Take 1 tablet (10 mEq total) by mouth 2 (two) times daily. 01/26/23   Nahser, Lela Purple, MD  predniSONE  (DELTASONE ) 5 MG tablet Take 1 tablet (5 mg total) by mouth daily as needed. Patient not taking: Reported on 09/05/2023 08/07/23   Matt Song, MD      Allergies    Egg-derived products and Lactose intolerance (gi)     Review of Systems   Review of Systems  Neurological:  Positive for dizziness.    Physical Exam Updated Vital Signs BP (!) 140/86 (BP Location: Right Arm)   Pulse 66   Temp 97.8 F (36.6 C) (Oral)   Resp 18   Ht 5\' 9"  (1.753 m)   Wt 106.8 kg   SpO2 100%   BMI 34.78 kg/m  Physical Exam Vitals and nursing note reviewed.  Constitutional:      General: She is not in acute distress.    Appearance: She is well-developed. She is not diaphoretic.  HENT:     Head: Normocephalic and atraumatic.     Right Ear: External ear normal.     Left Ear: External ear normal.     Nose: Nose normal.     Mouth/Throat:     Mouth: Mucous membranes are moist.  Eyes:     General: No scleral icterus.    Extraocular Movements: Extraocular movements intact.     Conjunctiva/sclera: Conjunctivae normal.     Pupils: Pupils are equal, round, and reactive to light.  Cardiovascular:     Rate and Rhythm: Normal rate and regular rhythm.     Heart sounds: Normal heart sounds. No murmur heard.    No friction rub. No gallop.  Pulmonary:     Effort: Pulmonary effort is normal. No respiratory distress.     Breath sounds: Normal breath sounds.  Abdominal:     General: Bowel sounds are normal. There is no distension.     Palpations: Abdomen is soft. There is no mass.     Tenderness: There is no abdominal tenderness. There is no guarding.  Musculoskeletal:     Cervical back: Normal range of motion.     Right lower leg: 1+ Edema present.     Left lower leg: 1+ Edema present.  Skin:    General: Skin is warm and dry.     Findings: Bruising present.     Comments: Small coin sized bruises on the left thigh in various states of healing no purpura or petechiae  Neurological:     Mental Status: She is alert and oriented to person, Deanna [name], and time.  Psychiatric:        Behavior: Behavior normal.     ED Results / Procedures / Treatments   Labs (all labs ordered are listed, but only abnormal results are  displayed) Labs Reviewed  CBC WITH DIFFERENTIAL/PLATELET  COMPREHENSIVE METABOLIC PANEL WITH GFR  CBG MONITORING, ED  TROPONIN T, HIGH SENSITIVITY    EKG EKG Interpretation Date/Time:  Saturday September 08 2023 10:27:37 EDT Ventricular Rate:  64 PR Interval:  186 QRS Duration:  102 QT Interval:  406 QTC Calculation: 419 R Axis:   10  Text Interpretation: Sinus rhythm Probable left atrial enlargement Left ventricular hypertrophy Borderline T abnormalities, diffuse leads Confirmed by Mozell Arias [phone]) on 09/10/2023  5:06:33 PM  Radiology No results found.  Procedures Procedures    Medications Ordered in ED Medications - No data to display  ED Course/ Medical Decision Making/ A&P Clinical Course as of 09/11/23 1112  Sat Sep 08, 2023  1105 Hemoglobin(!): 11.2 Hemoglobin 11.2 peers to be patient's baseline [AH]    Clinical Course User Index [AH] Tama Fails, PA-C                                 Medical Decision Making Patient here with near syncope fatigue and achiness After review of all data points I suspect that her sxs are related to her lupus. Offerd pain meds- patient will take otc meds as per her usual. Patient also laying down all day for 2 days which may have contributed to some orthostatic symptoms. Pt appears appropriate for discharge with op f/u.   Amount and/or Complexity of Data Reviewed Labs: ordered. Decision-making details documented in ED Course. ECG/medicine tests:     Details: EKG reassuring NSr rate of 64   This patient presents to the ED with chief complaint(s) of fatigue, bruising, near syncope with pertinent past medical history of lupus, aortic root dilation which further complicates the presenting complaint. The complaint involves an extensive differential diagnosis and treatment options and also carries with it a high risk of complications and morbidity.    The differential diagnosis of weakness includes but is not limited to  neurologic causes (GBS, myasthenia gravis, CVA, MS, ALS, transverse myelitis, spinal cord injury, CVA, botulism, ) and other causes: ACS, Arrhythmia, syncope, orthostatic hypotension, sepsis, hypoglycemia, electrolyte disturbance, hypothyroidism, respiratory failure, symptomatic anemia, dehydration, heat injury, polypharmacy, malignancy.   The initial plan is to order labs, cardiac monitoring.offered pain meds- patient declined   Additional history obtained: Additional history obtained from  emr Records reviewed Care Everywhere/External Records  Reassessment and review (also see workup area): Lab Tests: I Ordered, and personally interpreted labs.  The pertinent results include:   Troponin negative x2 Hgb 11- mild anemia Cmp wnl        Reevaluation of the patient after these medicines showed that the patient    stayed the same  Social Determinants of Health: SDOH Screenings   Depression (PHQ2-9): Low Risk  (01/30/2023)  Tobacco Use: Low Risk  (09/08/2023)    Cardiac Monitoring: The patient was maintained on a cardiac monitor.  I personally viewed and interpreted the cardiac monitor which showed an underlying rhythm of:  sinus rhythm  Complexity of problems addressed: Patient's presentation is most consistent with  acute, uncomplicated illness During patient's assessment  Disposition: After consideration of the diagnostic results and the patient's response to treatment,  I feel that the patent would benefit from discharge   .          Final Clinical Impression(s) / ED Diagnoses Final diagnoses:  None    Rx / DC Orders ED Discharge Orders     None         Tama Fails, PA-C 09/11/23 1118    Tama Fails, PA-C 09/11/23 1119    Teddi Favors, DO 09/20/23 [phone]

## 2023-09-08 NOTE — ED Notes (Signed)
 ED Provider at bedside.

## 2023-09-08 NOTE — ED Triage Notes (Signed)
 States hasn't been feeling well, bodyaches, finding bruising all over, on thinners. Also states swelling to ankles started yesterday, takes lasix . Also having dizziness intermittently.

## 2023-09-11 ENCOUNTER — Telehealth: Payer: Self-pay

## 2023-09-11 NOTE — Telephone Encounter (Signed)
 Patient contacted the office and states she was in the ER Saturday and had a low blood count and low iron. Patient inquired if she should increase her iron or if there is another solution. Advised the patient to contact her primary care to inquire about her iron supplementation. Patient verbalized understanding.

## 2023-09-12 ENCOUNTER — Ambulatory Visit: Payer: Self-pay

## 2023-09-12 NOTE — Telephone Encounter (Signed)
 Chief Complaint: Bruising Symptoms: Fatigue, intermittent dizziness Frequency: x 1 week Pertinent Negatives: Patient denies fever, SOB, CP, weakness, numbness Disposition: [] ED /[] Urgent Care (no appt availability in office) / [x] Appointment(In office/virtual)/ []  [facility]/ [] Home Care/ [] Refused Recommended Disposition /[] [facility]/ []  Follow-up with PCP Additional Notes: Pt reports history of anemia, experiencing fatigue, increased bruising, intermittent dizziness x 1 week. Denies SOB, blood in urine/stool. OV scheduled. This RN educated pt on home care, new-worsening symptoms, when to call back/seek emergent care. Pt verbalized understanding and agrees to plan.    Copied from CRM [id number]. Topic: Clinical - Pink Word Triage >> Sep 12, 2023  4:30 PM Alyse July wrote: Reason for Triage: Unusual Bruising, low iron level, red blood and hemoglobin concern Reason for Disposition  Taking Coumadin (warfarin) or other strong blood thinner, or known bleeding disorder (e.g., thrombocytopenia)  (Exception: Very small, painless bruise at heparin  injection site.)  Answer Assessment - Initial Assessment Questions 1. APPEARANCE of BRUISE: "Describe the bruise."      Different stages of healing 4. LOCATION: "Where is the bruise located?"      Legs and Arms 5. ONSET: "How long ago did the bruise occur?"      X 1 week 6. CAUSE: "Tell me how it happened."     Unknown 8. MEDICINES: "Do you take any medications which thin the blood such as: aspirin , heparin , ibuprofen (NSAIDS), Plavix , or Coumadin?"     Plavix  9. OTHER SYMPTOMS: "Do you have any other symptoms?"  (e.g., weakness, dizziness, pain, fever, nosebleed, blood in urine/stool)     Dizziness, fatigue  Protocols used: Bruises-A-AH

## 2023-09-13 ENCOUNTER — Encounter: Payer: Self-pay | Admitting: Family Medicine

## 2023-09-13 ENCOUNTER — Ambulatory Visit (INDEPENDENT_AMBULATORY_CARE_PROVIDER_SITE_OTHER): Admitting: Family Medicine

## 2023-09-13 VITALS — BP 136/80 | HR 75 | Temp 98.5°F | Ht 69.0 in | Wt 232.6 lb

## 2023-09-13 DIAGNOSIS — D509 Iron deficiency anemia, unspecified: Secondary | ICD-10-CM

## 2023-09-13 DIAGNOSIS — R5383 Other fatigue: Secondary | ICD-10-CM

## 2023-09-13 DIAGNOSIS — L959 Vasculitis limited to the skin, unspecified: Secondary | ICD-10-CM

## 2023-09-13 DIAGNOSIS — M25473 Effusion, unspecified ankle: Secondary | ICD-10-CM

## 2023-09-13 DIAGNOSIS — M329 Systemic lupus erythematosus, unspecified: Secondary | ICD-10-CM | POA: Diagnosis not present

## 2023-09-13 DIAGNOSIS — J45909 Unspecified asthma, uncomplicated: Secondary | ICD-10-CM

## 2023-09-13 HISTORY — DX: Unspecified asthma, uncomplicated: J45.909

## 2023-09-13 NOTE — Telephone Encounter (Signed)
Patient has appt 5/1 ?

## 2023-09-13 NOTE — Progress Notes (Addendum)
 Established Patient Office Visit   Subjective  Patient ID: Deanna [name], female    DOB: 01-12-1961  Age: 63 y.o. MRN: [id number]  Chief Complaint  Patient presents with   Fatigue    HPI Patient is a 63 year old female with pmh sig for SLE, OSA, Raynaud's, varicose veins with edema, vitamin D  deficiency, HLD, undifferentiated connective tissue disease, AVS, aortic root dilation who seen for follow-up on ongoing concern.  Patient seen in ED on 09/08/2022 for fatigue, bruising, and dizziness.  Mild anemia is hemoglobin 11.2.  Taking iron supplement.  Pt with continued symptoms of bruising and fatigue that started 1 wk prior to ED visit.  Taking lasix  but ankles have not improved.  Denies injury.  Also with ankle edema.  Pt states she has been limiting her sun exposure.  Required to be outside for work now that it is warmer the last 3 wks.  Patient Active Problem List   Diagnosis Date Noted   Acute sinusitis 06/22/2023   Asthma 06/22/2023   Lupus (systemic lupus erythematosus) (HCC) 06/22/2023   Cough 03/01/2023   RAD (reactive airway disease) 03/01/2023   Chronic rhinitis 03/01/2023   OSA (obstructive sleep apnea) 03/01/2023   Ganglion cyst of flexor tendon sheath of finger, left 01/31/2023   Pain in right leg 01/25/2022   Recurrent sinusitis 06/09/2021   Pedal edema 04/18/2021   High risk medication use 01/11/2021   Spontaneous bruising 10/19/2020   Undifferentiated connective tissue disease (HCC) 09/07/2020   Raynaud's phenomenon 09/07/2020   Onychomycosis 07/25/2019   Varicose veins of both legs with edema 07/25/2019   H. 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   Aortic root dilatation (HCC) 08/07/2016   Ear pain 07/22/2016   Hyperlipemia 12/02/2015   H/O splenectomy 12/02/2015    Hyperglycemia 01/27/2014   Obesity 01/27/2014   Vitamin D  deficiency 09/11/2012   Past Medical History:  Diagnosis Date   Allergy     GERD (gastroesophageal reflux disease)    "does not need meds anymore"   Heart murmur    Hyperlipidemia    Lactose intolerance    Lupus    Mini stroke    OSA (obstructive sleep apnea)    Positive ANA (antinuclear antibody)    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HCC)    Past Surgical History:  Procedure Laterality Date   ABDOMINAL HYSTERECTOMY  2006   benign tumor, complete   AORTIC ARCH ANGIOGRAPHY N/A 01/19/2023   Procedure: AORTIC ARCH ANGIOGRAPHY;  Surgeon: Odie Benne, MD;  Location: [facility] IN[facility]AS[facility];  Service: Cardiovascular;  Laterality: N/A;   KNEE ARTHROSCOPY Right    NASAL SEPTUM SURGERY     RIGHT HEART CATH AND CORONARY ANGIOGRAPHY N/A 01/19/2023   Procedure: RIGHT HEART CATH AND CORONARY ANGIOGRAPHY;  Surgeon: Odie Benne, MD;  Location: [facility];  Service: Cardiovascular;  Laterality: N/A;   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   TEE WITHOUT CARDIOVERSION N/A 01/19/2023   Procedure: TRANSESOPHAGEAL ECHOCARDIOGRAM;  Surgeon: Loyde Rule, MD;  Location: [facility] INVASIVE CV LAB;  Service: Cardiovascular;  Laterality: N/A;   Social History   Tobacco Use   Smoking status: Never    Passive exposure: Past   Smokeless tobacco: Never  Vaping Use   Vaping status: Never Used  Substance  Use Topics   Alcohol use: No   Drug use: No   Family History  Problem Relation Age of Onset   Arthritis Mother    Hyperlipidemia Mother    Stroke Mother    Hypertension Mother    Hypertension Father    Lupus Maternal Aunt    Breast cancer Daughter    Lupus Son    Colon cancer Neg Hx    Allergies  Allergen Reactions   Egg-Derived Products Nausea And Vomiting   Lactose Intolerance (Gi) Other (See Comments)    vomiting      ROS Negative unless stated above    Objective:     BP 136/80 (BP  Location: Left Arm, Patient Position: Sitting, Cuff Size: Normal)   Pulse 75   Temp 98.5 F (36.9 C) (Oral)   Ht 5\' 9"  (1.753 m)   Wt 232 lb 9.6 oz (105.5 kg)   SpO2 97%   BMI 34.35 kg/m  BP Readings from Last 3 Encounters:  09/13/23 136/80  09/08/23 (!) 175/97  09/05/23 128/81   Wt Readings from Last 3 Encounters:  09/13/23 232 lb 9.6 oz (105.5 kg)  09/08/23 235 lb 8 oz (106.8 kg)  07/25/23 230 lb (104.3 kg)      Physical Exam Constitutional:      General: She is not in acute distress.    Appearance: Normal appearance.  HENT:     Head: Normocephalic and atraumatic.     Nose: Nose normal.     Mouth/Throat:     Mouth: Mucous membranes are moist.  Cardiovascular:     Rate and Rhythm: Normal rate and regular rhythm.     Heart sounds: Normal heart sounds. No murmur heard.    No gallop.     Comments: B/l ankle edema. Pulmonary:     Effort: Pulmonary effort is normal. No respiratory distress.     Breath sounds: Normal breath sounds. No wheezing, rhonchi or rales.  Skin:    General: Skin is warm and dry.     Findings: Ecchymosis present.     Comments: Ecchymosis on b/l UE in various stages.  Neurological:     Mental Status: She is alert and oriented to person, place, and time.    No results found for any visits on 09/13/23.    Assessment & Plan:  Systemic lupus erythematosus, unspecified SLE type, unspecified organ involvement status (HCC)  Other fatigue  Ankle edema  Vasculitis of skin  Iron deficiency anemia, unspecified iron deficiency anemia type  Pt with increased fatigue, ankle edema, ecchymosis, dizziness in the last few wks likely 2/2 lupus flare.  Flare likely caused by increased sun exposure at work, though plaquenil  can also cause anemia, dizziness, thrombocytopenia, fatigue ,etc.  Reviewed various possible symptoms associated with lupus/lupus flare.  Labs from ED visit reviewed with patient.  Anemia improving as hemoglobin 11.2.  Prior thrombocytosis  (platelets 537 on 07/25/2023) now resolved.  Continue iron tablet daily.  Advised to take with vitamin C or orange juice to help with the resorption.  Can also increase intake of iron rich foods.  Patient advised exercise can help with fatigue though sounds counterproductive.  Continue Plaquenil .  Schedule sooner follow-up with rheumatology for worsening symptoms/more frequent symptoms.  Given handouts.  Return if symptoms worsen or fail to improve.   Viola Greulich, MD

## 2023-09-17 ENCOUNTER — Telehealth: Payer: Self-pay | Admitting: Family Medicine

## 2023-09-17 NOTE — Telephone Encounter (Unsigned)
 Copied from CRM [id number]. Topic: General - Other >> Sep 17, 2023 10:16 AM Rosamond Comes wrote: Reason for CRM: patient calling in asking for a note from Dr Arliss Lam. Medical condition limited time in the sun.  Patient phone [phone] ok to leave detailed message.

## 2023-09-19 ENCOUNTER — Telehealth: Payer: Self-pay

## 2023-09-19 NOTE — Telephone Encounter (Signed)
 Copied from CRM [id number]. Topic: General - Other >> Sep 17, 2023 10:16 AM Rosamond Comes wrote: Reason for CRM: patient calling in asking for a note from Dr Arliss Lam. Medical condition limited time in the sun.  Patient phone [phone] ok to leave detailed message. >> Sep 19, 2023  9:47 AM Howard Macho wrote: Patient called stating she is calling about the previous message sent and she states she has more information to give her

## 2023-09-20 NOTE — Telephone Encounter (Signed)
 Would advise patient to reach out to rheumatology as they are managing pt's SLE.

## 2023-09-20 NOTE — Telephone Encounter (Signed)
 Spoke with patient per Dr. Arliss Lam, patient is aware

## 2023-09-21 NOTE — Telephone Encounter (Signed)
 Matter addressed in a different telephone note.

## 2023-09-24 ENCOUNTER — Telehealth: Payer: Self-pay | Admitting: *Deleted

## 2023-09-24 NOTE — Telephone Encounter (Signed)
 Patient contacted the office stating she was seen in the ER and they advised her to follow up with her PCP. Patient did as instructed and now the PCP advised her to follow up with Dr. Rodell Citrin about her Lupus flares. Patient is requesting a written note about having limited sun exposure.

## 2023-10-17 NOTE — Telephone Encounter (Signed)
 Patient contacted the office to follow up on the letter she requested for limited sun exposure. Please advise.

## 2023-10-20 ENCOUNTER — Emergency Department (HOSPITAL_BASED_OUTPATIENT_CLINIC_OR_DEPARTMENT_OTHER)

## 2023-10-20 ENCOUNTER — Emergency Department (HOSPITAL_BASED_OUTPATIENT_CLINIC_OR_DEPARTMENT_OTHER): Admission: EM | Admit: 2023-10-20 | Discharge: 2023-10-20 | Disposition: A | Discharge status: Home / Self Care

## 2023-10-20 ENCOUNTER — Other Ambulatory Visit: Payer: Self-pay

## 2023-10-20 ENCOUNTER — Encounter (HOSPITAL_BASED_OUTPATIENT_CLINIC_OR_DEPARTMENT_OTHER): Payer: Self-pay

## 2023-10-20 DIAGNOSIS — R059 Cough, unspecified: Secondary | ICD-10-CM | POA: Diagnosis present

## 2023-10-20 DIAGNOSIS — J069 Acute upper respiratory infection, unspecified: Secondary | ICD-10-CM | POA: Insufficient documentation

## 2023-10-20 LAB — GROUP A STREP BY PCR: Group A Strep by PCR: NOT DETECTED

## 2023-10-20 LAB — RESP PANEL BY RT-PCR (RSV, FLU A&B, COVID)  RVPGX2
Influenza A by PCR: NEGATIVE
Influenza B by PCR: NEGATIVE
Resp Syncytial Virus by PCR: NEGATIVE
SARS Coronavirus 2 by RT PCR: NEGATIVE

## 2023-10-20 MED ORDER — BENZONATATE 100 MG PO CAPS: 100.0000 mg | ORAL_CAPSULE | Freq: Three times a day (TID) | ORAL | 0 refills | Status: DC | PRN

## 2023-10-20 NOTE — ED Notes (Signed)
 Pt alert and oriented X 4 at the time of discharge. RR even and unlabored. No acute distress noted. Pt verbalized understanding of discharge instructions as discussed. Pt ambulatory to lobby at time of discharge.

## 2023-10-20 NOTE — ED Triage Notes (Signed)
 Pt reports cough x1 week and sore throat x2 days. 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 Pt denies any inability to swallow. Pt denies any fever or chills.

## 2023-10-20 NOTE — Discharge Instructions (Signed)
 Your chest x-ray did not show pneumonia.  Your strep test was negative.  You do not have flu/COVID/RSV.  As discussed, care is supportive you may take Tylenol  alternating with ibuprofen for pain.  We are prescribing you some medications to help with your cough.  Return immediately if develop fevers, chills, chest pain, worsening shortness of breath, inability eat or drink due to nausea vomiting, you pass out or you develop any new or worsening symptoms that are concerning to you.

## 2023-10-20 NOTE — ED Provider Notes (Signed)
 [facility] Provider Note   CSN: [id number] Arrival date & time: 10/20/23  4098     History  Chief Complaint  Patient presents with   Cough   Sore Throat    Deanna [name] is a 63 y.o. female.  Is a 63 year old female presents emergency department with URI symptoms.  Has had productive cough for the past week, and some sore throat for the past 4 days.  No fevers no chills, no shortness of breath.  No difficulty swallowing.  No angioedema or tongue swelling.  No abdominal pain no nausea or vomiting.  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   Cough Sore Throat       Home Medications Prior to Admission medications   Medication Sig Start Date End Date Taking? Authorizing Provider  benzonatate  (TESSALON ) 100 MG capsule Take 1 capsule (100 mg total) by mouth every 8 (eight) hours as needed for cough. 10/20/23  Yes Rolinda Climes, DO  Albuterol-Budesonide (AIRSUPRA ) 90-80 MCG/ACT AERO Inhale 2 puffs into the lungs every 4 (four) hours as needed. 03/01/23   Parrett, Macdonald Savoy, NP  amoxicillin -clavulanate (AUGMENTIN ) 875-125 MG tablet Take 1 tablet by mouth every 12 (twelve) hours. 07/07/23   Neil Balls A, PA  Azelastine -Fluticasone  137-50 MCG/ACT SUSP Place 2 puffs into the nose daily. 03/01/23   Parrett, Macdonald Savoy, NP  azithromycin  (ZITHROMAX ) 250 MG tablet Take 1 tablet (250 mg total) by mouth daily. Take first 2 tablets together, then 1 every day until finished. 07/07/23   [place] Butter, PA  Cholecalciferol 50 MCG (2000 UT) TBDP Take 2,000 Units by mouth daily.    [provider]  clopidogrel  (PLAVIX ) 75 MG tablet Take 1 tablet (75 mg total) by mouth daily. 07/03/23   Jhonny Moss, MD  ELDERBERRY PO Take by mouth.    [provider]  Ferrous Sulfate (IRON PO) Take 65 mg by mouth daily.    [provider]  fexofenadine  (ALLEGRA ) 180 MG tablet TAKE 1 TABLET BY MOUTH EVERY  DAY Patient taking differently: Take 180 mg by mouth as needed for allergies. 03/21/21   Viola Greulich, MD  furosemide  (LASIX ) 20 MG tablet TAKE 1 TABLET BY MOUTH EVERY DAY 08/08/23   Nahser, Lela Purple, MD  hydroxychloroquine  (PLAQUENIL ) 200 MG tablet TAKE 2 TABLETS BY MOUTH EVERY DAY 07/27/23   Rice, Haig Levan, MD  lidocaine  (XYLOCAINE ) 2 % solution Use as directed 15 mLs in the mouth or throat as needed for mouth pain. 07/05/23   Angelyn Kennel M, PA-C  naproxen sodium (ALEVE) 220 MG tablet Take 440 mg by mouth daily as needed (Headache).    [provider]  nitroGLYCERIN  (NITROSTAT ) 0.4 MG SL tablet Place 1 tablet (0.4 mg total) under the tongue every 5 (five) minutes as needed for chest pain. 01/11/23   Nahser, Lela Purple, MD  potassium chloride  (KLOR-CON ) 10 MEQ tablet Take 1 tablet (10 mEq total) by mouth 2 (two) times daily. 01/26/23   Nahser, Lela Purple, MD  predniSONE  (DELTASONE ) 5 MG tablet Take 1 tablet (5 mg total) by mouth daily as needed. 08/07/23   Matt Song, MD      Allergies    Egg-derived products and Lactose intolerance (gi)    Review of Systems   Review of Systems  Respiratory:  Positive for cough.     Physical Exam Updated Vital Signs BP (!) 153/79 (BP Location: Left Arm)  Pulse 64   Temp 97.9 F (36.6 C) (Oral)   Resp 18   Ht 5\' 9"  (1.753 m)   Wt 104.3 kg   SpO2 100%   BMI 33.97 kg/m  Physical Exam Vitals and nursing note reviewed.  Constitutional:      General: She is not in acute distress.    Appearance: She is not toxic-appearing.  HENT:     Mouth/Throat:     Mouth: Mucous membranes are moist.     Pharynx: No posterior oropharyngeal erythema.     Tonsils: No tonsillar exudate.  Cardiovascular:     Rate and Rhythm: Normal rate and regular rhythm.  Pulmonary:     Effort: Pulmonary effort is normal.     Breath sounds: Normal breath sounds.  Abdominal:     Palpations: Abdomen is soft.  Skin:    General: Skin is warm and dry.      Capillary Refill: Capillary refill takes less than 2 seconds.  Neurological:     General: No focal deficit present.     Mental Status: She is alert and oriented to person, place, and time.  Psychiatric:        Mood and Affect: Mood normal.        Behavior: Behavior normal.     ED Results / Procedures / Treatments   Labs (all labs ordered are listed, but only abnormal results are displayed) Labs Reviewed  GROUP A STREP BY PCR  RESP PANEL BY RT-PCR (RSV, FLU A&B, COVID)  [id number]    EKG None  Radiology DG Chest 2 View Result Date: 10/20/2023 CLINICAL DATA:  cough; PNA? EXAM: CHEST - 2 VIEW COMPARISON:  Chest x-ray 07/07/2023 FINDINGS: The heart and mediastinal contours are unchanged. Surgical clip overlies the left upper abdomen. No focal consolidation. No pulmonary edema. No pleural effusion. No pneumothorax. No acute osseous abnormality. IMPRESSION: No active cardiopulmonary disease. Electronically Signed   By: Morgane  Naveau M.D.   On: 10/20/2023 10:26    Procedures Procedures    Medications Ordered in ED Medications - No data to display  ED Course/ Medical Decision Making/ A&P Clinical Course as of 10/20/23 1529  Sat Oct 20, 2023  1025 DG Chest 2 View On my independent interpretation; I do not appreciate obvious pneumonia.  No pneumothorax.  No widened mediastinum. [TY]  1039 Group A Strep by PCR: NOT DETECTED [TY]  1040 DG Chest 2 View IMPRESSION: No active cardiopulmonary disease.   Electronically Signed   [TY]  1051 Resp panel by RT-PCR (RSV, Flu A&B, Covid) Throat Flu/RSV/COVID-negative. [TY]  1529 Workup negative.  Will discharge with Tessalon  Perles and discussed return precautions.  Stable for discharge at this time. [TY]    Clinical Course User Index [TY] Rolinda Climes, DO                                 Medical Decision Making This is a well-appearing 63 year old female presenting emergency department for URI symptoms, cough.  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  She is well-appearing today, afebrile nontachycardic maintaining oxygen  saturation on room air.  Slightly hypertensive.  Constellation of symptoms consistent with a viral upper respiratory infection.  Triage ordered flu/COVID/RSV which is reasonable given likely viral nature.  Strep test to evaluate for strep throat.  Will also get chest x-ray given her productive cough.  See ED course for further MDM  and final disposition.  Amount and/or Complexity of Data Reviewed Labs:  Decision-making details documented in ED Course. Radiology: ordered. Decision-making details documented in ED Course.  Risk Prescription drug management.         Final Clinical Impression(s) / ED Diagnoses Final diagnoses:  Upper respiratory tract infection, unspecified type    Rx / DC Orders ED Discharge Orders          Ordered    benzonatate  (TESSALON ) 100 MG capsule  Every 8 hours PRN        10/20/23 1053              Rolinda Climes, DO 10/20/23 1530

## 2023-10-22 NOTE — Telephone Encounter (Signed)
 Patient contacted the office again for the letter she requested for limited sun exposure. Please advise

## 2023-11-06 ENCOUNTER — Telehealth: Payer: Self-pay | Admitting: *Deleted

## 2023-11-06 NOTE — Telephone Encounter (Signed)
 Called and spoke with patient and advised her that Dr Alveta sent her a letter via MyChart and that while it may not be entirely what her job is needing, it's the best we can do. She verbalized appreciation.  Spoke with Dr Alveta who provided verbage for letter. Letter completed and sent to patient.

## 2023-11-06 NOTE — Telephone Encounter (Signed)
 Received an email that patient has been trying to contact our office regarding a note for work.  In looking at chart I can see where she has been waiting for Rheumatology to call her back.  The first call to that office was in May and a follow up call was made on 10/22/23 but she has not received a call back.  She is requesting a work note from us  to have limited time outside due to breathing issues with the heat.  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  I let her know I would forward to Dr. Alveta but as she has not been seen since February, she may need to come in for an office visit.  She is aware that either myself or Dr. Nicki nurse will call her back.

## 2023-11-07 ENCOUNTER — Telehealth: Payer: Self-pay | Admitting: *Deleted

## 2023-11-07 NOTE — Telephone Encounter (Signed)
 Copied from CRM [id number]. Topic: General - Other >> Oct 22, 2023 10:20 AM Rilla B wrote: Reason for CRM: Patient would like to speak to T Parrett nurse and see if she can receive a note that would limit her time outside in the heat.  Please call.  Okay to leave a message. She's in the classroom and will call right back.  ATC x1.  LVM to return call.

## 2023-11-08 NOTE — Telephone Encounter (Signed)
ATC x2.  LVM to return call.  Mychart message sent as well.

## 2023-11-13 DIAGNOSIS — I38 Endocarditis, valve unspecified: Secondary | ICD-10-CM

## 2023-11-13 HISTORY — DX: Endocarditis, valve unspecified: I38

## 2023-11-15 ENCOUNTER — Encounter: Payer: Self-pay | Admitting: *Deleted

## 2023-11-15 ENCOUNTER — Telehealth: Payer: Self-pay | Admitting: *Deleted

## 2023-11-15 NOTE — Telephone Encounter (Signed)
 Copied from CRM [id number]. Topic: General - Other >> Nov 08, 2023  1:40 PM Celestine F wrote: Reason for CRM: Pt missed a call from [place]. 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 Pt is responding to the questions asked by Powell regarding a letter the pt requested. Pt's phone number is [phone].  ATC patient x1.  LVM that I would send her a mychart message as I think it would be easier to communicate what is needed that way as I have been unable to speak with her directly.  Mychart message sent.

## 2023-12-12 NOTE — Telephone Encounter (Signed)
 See mychart message from 12/12/23.

## 2023-12-13 NOTE — Telephone Encounter (Signed)
 Yes that fine to put in letter. Thank you

## 2023-12-19 NOTE — Telephone Encounter (Signed)
 Letter provided to patient via mychart.  Nothing further needed.

## 2023-12-23 ENCOUNTER — Other Ambulatory Visit: Payer: Self-pay | Admitting: Internal Medicine

## 2023-12-23 DIAGNOSIS — I7300 Raynaud's syndrome without gangrene: Secondary | ICD-10-CM

## 2023-12-24 NOTE — Telephone Encounter (Signed)
 Last Fill: 07/27/2023  Eye exam: 01/10/2023 Normal    Labs: 09/08/2023 RBC 3.83 Hemoglobin 11.2 HCT 34.2  Rest of CBC and CMP WNL   Next Visit: 01/28/2024  Last Visit: 07/25/2023  DX: Undifferentiated connective tissue disease   Current Dose per office note 07/25/2023: hydroxychloroquine  400 mg daily   Okay to refill Plaquenil ?

## 2024-01-15 NOTE — Progress Notes (Signed)
 Office Visit Note  Patient: Deanna [name]             Date of Birth: [birth year]           MRN: [id number]             PCP: Mercer Clotilda SAUNDERS, MD Referring: Mercer Clotilda SAUNDERS, MD Visit Date: 01/28/2024   Subjective:  Pain (Joint and body ) and Medical Management of Chronic Issues (Numbness in the left arm )   Discussed the use of AI scribe software for clinical note transcription with the patient, who gave verbal consent to proceed.  History of Present Illness   Deanna [name] is a 63 y.o. female here for follow up for lupus on hydroxychloroquine  400 mg daily.    She has been experiencing significant joint pain and swelling, particularly in the fingers of her right hand, which started with two fingers and then spread to the rest. The joints feel stiff, and she frequently moves them to alleviate the stiffness. She has been taking Aleve more frequently to manage the pain. Swelling is also noted in her ankles, and she believes this may be related to her recent travel by car to [place] and [place].  About two weeks ago, she experienced severe pain in her left arm, which was so intense that she could not lift it and had to hold it by her side. The pain has since improved, but she still feels some stiffness in the arm.  She mentions a history of asthma, which has been exacerbated by heat, leading to breathing difficulties. She has not taken any steroid medication since her last visit but has been using elderberry for immune support. She noticed white lines on her fingernails while taking elderberry, which resolved after she stopped taking it.  She recalls a past episode of TTP, during which she experienced joint pain. She is concerned about bruises that have persisted for about two months, particularly a large dark spot on her arm.  Previous HPI 07/25/2023 Deanna [name] is a 63 y.o. female here for follow up for lupus on hydroxychloroquine  400 mg daily.  The patient  presents with respiratory symptoms following a recent flu infection.   She tested positive for the flu on the fifteenth of last month, after which her condition deteriorated significantly over the course of a week. She experienced severe coughing, which led to a raw throat, prompting a visit to the emergency room where she received medication to numb her throat and cough medicine. Despite this, her symptoms worsened by the following Saturday, leading to another emergency room visit where x-rays were performed to rule out pneumonia. She was treated with prednisone  and antibiotics, which gradually improved her condition over the next week.   Concurrent with her respiratory symptoms, she experienced a sinus infection characterized by facial pain. She has a history of sinus infections following illness, which she discussed with an ENT specialist, who did not attribute it to her previous sinus surgery. During this period, she experienced epistaxis for about three days.   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 She is currently taking Plaquenil  without issues. She reports dry skin, which she manages with oils and lotions, though it remains problematic. She also notes dental issues, with significant decay in her back teeth, and has an upcoming dental appointment. She recently visited an eye doctor due to seeing flashes of light and was  advised to continue using eye drops, which have helped alleviate the symptoms.      Previous HPI 01/31/2023 Deanna [name] is a 63 y.o. female here for follow up for lupus on hydroxychloroquine  400 mg daily.  No particular exacerbation of joint pain or swelling since the last visit. Has slight leg swelling. She still having trouble with increased dyspnea on exertion.  She underwent TEE and right heart catheterization earlier this month.  She was started on furosemide  and potassium  supplementation but just began this 1 week ago.  She had ophthalmology exam in August with no problems found.   Previous HPI 10/25/22 Deanna [name] is a 63 y.o. female here for follow up for lupus on hydroxychloroquine  400 mg daily.  Since last visit she has had a lot of medical events.  She was sick with upper respiratory infection symptoms for about 2 months and developed pleuritic chest pain imaging consistent with pleurisy as well.  Subsequently developed arm weakness and was found to have small stroke.  Did not have specific precipitating cause identified on workup and is doing well without significant residual afterwards.  She just had evaluation for some increase in right knee swelling a bit worse than usual.  Also having some tenderness in her hands around PIP joints without any visible changes.   Previous HPI 04/26/22 Deanna [name] is a 63 y.o. 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  Since her last visit her lateral hip pain is doing better.  She took a Flexeril  for short time which was beneficial but noticed a lot of drowsiness or loopy sensation side effects so stopped taking this by now.  Biggest issue currently is her sleep.  Has a lot of fatigue and increase in stiffness associated with fragmented sleep.  She wakes up by 3 AM every morning and cannot fall back asleep or remaining asleep.  She is not taking naps during the day and is trying to follow a consistent sleep schedule without much improvement.  She has known OSA and reports consistent use of her CPAP.   Previous HPI 01/25/22 Deanna [name] is a 63 y.o. female here for follow up For lupus/RA on HCQ 400 mg daily.  Systemic symptoms are doing pretty well but is having increased trouble with pain in her low back and right leg.  Pain going down the lateral side of the leg from the hip down to the knee.  She is not seeing any visible swelling or abnormal appearance in the  affected area.  She is not having any falls or severe mobility limitation.     Previous HPI 10/27/2021 Deanna [name] is a 63 y.o. 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 She was doing fairly well off prednisone  after last visit. She had a good vacation trip to [place] in March. However afterwards was sick with COVID without serious complications. 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 She has had 3 sinus infections so far this year. She saw ENT late last year due to recurrent sinusitis problems. During these illnesses she has an increase in hand and wrist pains. Most recently developed burning type of pain affecting along her hairline and low back area. This comes and goes no visible changes associated. She noticed some hair loss and thinning at multiple areas during this time. She  has tried avoiding any chemical exposures to her hair and scalp.    Previous HPI 04/18/21 Deanna [name] is a 63 y.o. female here for follow up for lupus on HCQ 400 mg PO daily and tapering off prednisone . She has had ongoing upper respiratory problems since our last visit with recurrent ear infection and upper respiratory infections most of the past 3 months. She has taken several rounds of antibiotics with clearance but quick return of symptoms. She was treated with high dose prednisone  once after visiting to the emergency department. She has experienced bleeding and ulceration in the nose. She has also suffered spontaneous bruising in one digit for about 2 days at a time with spontaneous resolution. She had past ENT surgery for septum defect but has not had chronic problems until all this started again after our last visit. She has noticed a small increased in lower extremity swelling with deeper sock band indentations, no discoloration or pain or significant weight change. She had her HCQ eye exam  with [facility] okay to continue HCQ but they are planning to follow up in 2-4 weeks.   Previous HPI 01/11/21 Deanna [name] is a 63 y.o. female here for follow up for lupus after starting HCQ 400 mg PO daily and prednisone  5 mg PO daily.  Overall doing well on this but has continued the low-dose steroids.  She has not noticed problems with taking the medication.   09/07/20 Deanna [name] is a 63 y.o. female here for evaluation of positive ANA with fatigue and myalgias. Symptoms are persistent since last year with fatigue, pains in her hands and ankles and stiffness but she has had some joint pains going back for years. She typically notices swelling at the ankles by the end of the day. This is moderately painful but not debilitating. Her hand pain is worst with use during the day and she notices swelling when eating a lot of salt. 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 She does experience raynaud's symptoms with pallor in fingertips without any lesions ulcers or injuries. She denies problems with mouth and eye dryness, lymphadenopathy, chest pain, skin rashes, or photosensitive   [facility] Labs reviewed ANA 1:640 speckled CB-CAP EC4d 16 CB-CAP BC4d 90   Lupus manifestations Fatigue Arthralgia Raynaud's ?Spontaneous bruising Hx of TTP s/p splenectomy (primary vs secondary?)   Labs reviewed ANA pos Scl-70 1.3   Review of Systems  Constitutional:  Positive for fatigue.  HENT:  Positive for mouth sores. Negative for mouth dryness.   Eyes:  Negative for dryness.  Respiratory:  Positive for shortness of breath.   Cardiovascular:  Positive for chest pain and palpitations.  Gastrointestinal:  Negative for blood in stool, constipation and diarrhea.  Endocrine: Negative for increased urination.  Genitourinary:  Negative for involuntary urination.  Musculoskeletal:  Positive for joint pain, gait problem, joint  pain, joint swelling, myalgias, muscle weakness, morning stiffness, muscle tenderness and myalgias.  Skin:  Positive for sensitivity to sunlight. Negative for color change, rash and hair loss.  Allergic/Immunologic: Positive for susceptible to infections.  Neurological:  Positive for dizziness. Negative for headaches.  Hematological:  Negative for swollen glands.  Psychiatric/Behavioral:  Positive for sleep disturbance. Negative for depressed mood. The patient is not nervous/anxious.     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   Aortic root dilatation 08/07/2016   Ear pain 07/22/2016   Hyperlipemia 12/02/2015   H/O splenectomy 12/02/2015   Hyperglycemia 01/27/2014   Obesity 01/27/2014   Vitamin D  deficiency 09/11/2012    Past Medical History:  Diagnosis Date   Allergy     Asthma 09/2023   GERD (gastroesophageal reflux disease)    does not need meds anymore   Heart murmur    Hyperlipidemia    Lactose intolerance    Leaky heart valve 11/2023   Lupus    Mini stroke    OSA (obstructive  sleep apnea)    Positive ANA (antinuclear antibody)    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HCC)     Family History  Problem Relation Age of Onset   Arthritis Mother    Hyperlipidemia Mother    Stroke Mother    Hypertension Mother    Hypertension Father    Lupus Maternal Aunt    Breast cancer Daughter    Lupus Son    Colon cancer Neg Hx    Past Surgical History:  Procedure Laterality Date   ABDOMINAL HYSTERECTOMY  2006   benign tumor, complete   AORTIC ARCH ANGIOGRAPHY N/A 01/19/2023   Procedure: AORTIC ARCH ANGIOGRAPHY;  Surgeon: Verlin Lonni BIRCH, MD;  Location: [facility] INVASIVE CV [facility];  Service: Cardiovascular;  Laterality: N/A;   KNEE ARTHROSCOPY Right    NASAL SEPTUM SURGERY     RIGHT HEART CATH AND CORONARY ANGIOGRAPHY N/A 01/19/2023   Procedure: RIGHT HEART CATH AND CORONARY ANGIOGRAPHY;  Surgeon: Verlin Lonni BIRCH, MD;  Location: [facility] INVAS[facility] CV [facility];  Service: Cardiovascular;  Laterality: N/A;   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   TEE WITHOUT CARDIOVERSION N/A 01/19/2023   Procedure: TRANSESOPHAGEAL ECHOCARDIOGRAM;  Surgeon: Delford Maude BROCKS, MD;  Location: [facility] INVASIVE CV LAB;  Service: Cardiovascular;  Laterality: N/A;   Social History   Social History Narrative   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 Situation: lives 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, husband and mother      Spiritual Beliefs: Christian      Lifestyle: walks a little a few days per week, diet is ok      Right Handed      Drinks Caffeine      Lupus and RA 01-19-2023         Immunization History  Administered Date(s) Administered   Meningococcal B, OMV 12/02/2015   PFIZER Comirnaty(Gray Top)Covid-19 Tri-Sucrose Vaccine 07/19/2019, 08/09/2019, 06/16/2020   PPD Test 01/21/2018   Pneumococcal Polysaccharide-23 10/14/2010, 10/21/2014     Objective: Vital Signs: BP (!) 111/58   Pulse (!) 42   Temp 97.9 F (36.6 C)  Resp 16   Ht 5' 9 (1.753 m)   Wt 232 lb 12.8 oz  (105.6 kg)   BMI 34.38 kg/m    Physical Exam Eyes:     Conjunctiva/sclera: Conjunctivae normal.  Cardiovascular:     Rate and Rhythm: Regular rhythm. Bradycardia present.     Heart sounds: Murmur heard.  Pulmonary:     Effort: Pulmonary effort is normal.     Breath sounds: Normal breath sounds.  Lymphadenopathy:     Cervical: No cervical adenopathy.  Skin:    General: Skin is warm and dry.  Neurological:     Mental Status: She is alert.  Psychiatric:        Mood and Affect: Mood normal.      Musculoskeletal Exam:  Neck full ROM no tenderness Shoulders full ROM no tenderness or swelling Elbows full ROM no tenderness or swelling Wrists full ROM no tenderness or swelling Fingers full ROM no swelling, left hand PIPs tenderness to pressure No hip pain with internal and external rotation, no lateral tenderness to pressure Knees full ROM with patellofemoral crepitus b/l, no palpable effusions   Investigation: No additional findings.  Imaging: No results found.  Recent Labs: Lab Results  Component Value Date   WBC 6.8 01/28/2024   HGB 11.9 01/28/2024   PLT 450 (H) 01/28/2024   NA 141 01/28/2024   K 3.6 01/28/2024   CL 104 01/28/2024   CO2 30 01/28/2024   GLUCOSE 91 01/28/2024   BUN 10 01/28/2024   CREATININE 0.87 01/28/2024   BILITOT 0.6 01/28/2024   ALKPHOS 74 09/08/2023   AST 14 01/28/2024   ALT 11 01/28/2024   PROT 7.3 01/28/2024   ALBUMIN 4.1 09/08/2023   CALCIUM 9.6 01/28/2024   GFRAA >60 11/27/2019   QFTBGOLDPLUS NEGATIVE 01/11/2021    Speciality Comments: PLQ eye exam 01/10/2023 Normal  @ [facility] f/u 6 months  Procedures:  No procedures performed Allergies: Egg-derived products and Lactose intolerance (gi)   Assessment / Plan:     Visit Diagnoses: Undifferentiated connective tissue disease - Plan: Sedimentation rate, C-reactive protein Worsening joint pain and stiffness, particularly in the hands. Differential includes  autoimmune-mediated inflammation versus osteoarthritis.  No appreciable synovitis on exam.  Pain and numbness issues do not follow a specific dermatomal pattern. -Continue hydroxychloroquine  400 mg daily - Review current lab results to assess inflammation markers. - Consider a course of steroids if inflammation markers are elevated. - Consider starting gabapentin  if lab results are normal but pain persists.  High risk medication use - hydroxychloroquine  400 mg daily Needs PLQ Eye exam - Plan: CBC with Differential/Platelet, Comprehensive metabolic panel with GFR - Checking CBC and CMP for medication monitoring continue long-term use of hydroxychloroquine   Edema of hands and ankles Swelling in right hand and ankles.        Orders: Orders Placed This Encounter  Procedures   Sedimentation rate   C-reactive protein   CBC with Differential/Platelet   Comprehensive metabolic panel with GFR   No orders of the defined types were placed in this encounter.    Follow-Up Instructions: Return in about 6 months (around 07/27/2024) for UCTD on HCQ f/u 6mos.   Lonni LELON Ester, MD  Note - This record has been created using [employer].  Chart creation errors have been sought, but may not always  have been located. Such creation errors do not reflect on  the standard of medical care.

## 2024-01-28 ENCOUNTER — Ambulatory Visit: Attending: Internal Medicine | Admitting: Internal Medicine

## 2024-01-28 ENCOUNTER — Encounter: Payer: Self-pay | Admitting: Internal Medicine

## 2024-01-28 VITALS — BP 111/58 | HR 42 | Temp 97.9°F | Resp 16 | Ht 69.0 in | Wt 232.8 lb

## 2024-01-28 DIAGNOSIS — Z79899 Other long term (current) drug therapy: Secondary | ICD-10-CM

## 2024-01-28 DIAGNOSIS — M359 Systemic involvement of connective tissue, unspecified: Secondary | ICD-10-CM | POA: Diagnosis not present

## 2024-01-29 LAB — COMPREHENSIVE METABOLIC PANEL WITH GFR
AG Ratio: 1.4 (calc) (ref 1.0–2.5)
ALT: 11 U/L (ref 6–29)
AST: 14 U/L (ref 10–35)
Albumin: 4.2 g/dL (ref 3.6–5.1)
Alkaline phosphatase (APISO): 62 U/L (ref 37–153)
BUN: 10 mg/dL (ref 7–25)
CO2: 30 mmol/L (ref 20–32)
Calcium: 9.6 mg/dL (ref 8.6–10.4)
Chloride: 104 mmol/L (ref 98–110)
Creat: 0.87 mg/dL (ref 0.50–1.05)
Globulin: 3.1 g/dL (ref 1.9–3.7)
Glucose, Bld: 91 mg/dL (ref 65–99)
Potassium: 3.6 mmol/L (ref 3.5–5.3)
Sodium: 141 mmol/L (ref 135–146)
Total Bilirubin: 0.6 mg/dL (ref 0.2–1.2)
Total Protein: 7.3 g/dL (ref 6.1–8.1)
eGFR: 76 mL/min/1.73m2 (ref >=60)

## 2024-01-29 LAB — CBC WITH DIFFERENTIAL/PLATELET
Absolute Lymphocytes: 2033 {cells}/uL (ref 850–3900)
Absolute Monocytes: 741 {cells}/uL (ref 200–950)
Basophils Absolute: 68 {cells}/uL (ref 0–200)
Basophils Relative: 1 %
Eosinophils Absolute: 129 {cells}/uL (ref 15–500)
Eosinophils Relative: 1.9 %
HCT: 35.9 % (ref 35.0–45.0)
Hemoglobin: 11.9 g/dL (ref 11.7–15.5)
MCH: 28.9 pg (ref 27.0–33.0)
MCHC: 33.1 g/dL (ref 32.0–36.0)
MCV: 87.1 fL (ref 80.0–100.0)
MPV: 9.8 fL (ref 7.5–12.5)
Monocytes Relative: 10.9 %
Neutro Abs: 3828 {cells}/uL (ref 1500–7800)
Neutrophils Relative %: 56.3 %
Platelets: 450 Thousand/uL — ABNORMAL HIGH (ref 140–400)
RBC: 4.12 Million/uL (ref 3.80–5.10)
RDW: 13.5 % (ref 11.0–15.0)
Total Lymphocyte: 29.9 %
WBC: 6.8 Thousand/uL (ref 3.8–10.8)

## 2024-01-29 LAB — C-REACTIVE PROTEIN: CRP: <3 mg/L (ref <8.0)

## 2024-01-29 LAB — SEDIMENTATION RATE: Sed Rate: 11 mm/h (ref 0–30)

## 2024-02-19 ENCOUNTER — Other Ambulatory Visit: Payer: Self-pay | Admitting: Family Medicine

## 2024-02-19 DIAGNOSIS — Z1231 Encounter for screening mammogram for malignant neoplasm of breast: Secondary | ICD-10-CM

## 2024-02-26 ENCOUNTER — Ambulatory Visit
Admission: RE | Admit: 2024-02-26 | Discharge: 2024-02-26 | Disposition: A | Discharge status: Home / Self Care | Source: Ambulatory Visit

## 2024-02-26 DIAGNOSIS — Z1231 Encounter for screening mammogram for malignant neoplasm of breast: Secondary | ICD-10-CM

## 2024-03-11 ENCOUNTER — Ambulatory Visit: Payer: Self-pay | Admitting: Pulmonary Disease

## 2024-03-11 ENCOUNTER — Encounter: Payer: Self-pay | Admitting: Pulmonary Disease

## 2024-03-11 VITALS — BP 126/72 | HR 67 | Ht 69.0 in | Wt 234.0 lb

## 2024-03-11 DIAGNOSIS — G4733 Obstructive sleep apnea (adult) (pediatric): Secondary | ICD-10-CM

## 2024-03-11 DIAGNOSIS — J4520 Mild intermittent asthma, uncomplicated: Secondary | ICD-10-CM

## 2024-03-11 MED ORDER — FLUTICASONE-SALMETEROL 115-21 MCG/ACT IN AERO: 2.0000 | INHALATION_SPRAY | Freq: Two times a day (BID) | RESPIRATORY_TRACT | 12 refills | Status: DC

## 2024-03-11 MED ORDER — BUDESONIDE-FORMOTEROL FUMARATE 160-4.5 MCG/ACT IN AERO: 2.0000 | INHALATION_SPRAY | Freq: Two times a day (BID) | RESPIRATORY_TRACT | 5 refills | Status: AC

## 2024-03-11 NOTE — Progress Notes (Signed)
 Deanna [name]    [id number]    [birth year]  Primary Care Physician:Banks, Clotilda SAUNDERS, MD  Referring Physician: Mercer Clotilda SAUNDERS, MD [address] [place],  [place] [address]  Chief complaint:   Follow-up for obstructive sleep apnea and asthma symptoms  HPI:  Still with shortness of breath with activity Some chest discomfort Feels limited with activities  Tries to use her CPAP on a nightly basis Feels better when she uses it Spouse definitely tells that she snores less with CPAP on  She does have a prescription for Airsupra  which she uses as needed  Feels short of breath with moderate activity  Was not aware of any significant lung disease previously but during the last visit was told she may have asthma  She does have a history of lupus, chronic fatigue, anemia  Outpatient Encounter Medications as of 03/11/2024  Medication Sig   Albuterol-Budesonide (AIRSUPRA ) 90-80 MCG/ACT AERO Inhale 2 puffs into the lungs every 4 (four) hours as needed.   Azelastine -Fluticasone  137-50 MCG/ACT SUSP Place 2 puffs into the nose daily.   azithromycin  (ZITHROMAX ) 250 MG tablet Take 1 tablet (250 mg total) by mouth daily. Take first 2 tablets together, then 1 every day until finished.   benzonatate  (TESSALON ) 100 MG capsule Take 1 capsule (100 mg total) by mouth every 8 (eight) hours as needed for cough.   budesonide-formoterol (BREYNA) 160-4.5 MCG/ACT inhaler Inhale 2 puffs into the lungs 2 (two) times daily.   Cholecalciferol 50 MCG (2000 UT) TBDP Take 2,000 Units by mouth daily.   clopidogrel  (PLAVIX ) 75 MG tablet Take 1 tablet (75 mg total) by mouth daily.   Ferrous Sulfate (IRON PO) Take 65 mg by mouth daily.   fexofenadine  (ALLEGRA ) 180 MG tablet TAKE 1 TABLET BY MOUTH EVERY DAY (Patient taking differently: Take 180 mg by mouth as needed for allergies.)   furosemide  (LASIX ) 20 MG tablet TAKE 1 TABLET BY MOUTH EVERY DAY   hydroxychloroquine  (PLAQUENIL ) 200 MG tablet  TAKE 2 TABLETS BY MOUTH EVERY DAY   lidocaine  (XYLOCAINE ) 2 % solution Use as directed 15 mLs in the mouth or throat as needed for mouth pain.   naproxen sodium (ALEVE) 220 MG tablet Take 440 mg by mouth daily as needed (Headache).   nitroGLYCERIN  (NITROSTAT ) 0.4 MG SL tablet Place 1 tablet (0.4 mg total) under the tongue every 5 (five) minutes as needed for chest pain.   potassium chloride  (KLOR-CON ) 10 MEQ tablet Take 1 tablet (10 mEq total) by mouth 2 (two) times daily.   [DISCONTINUED] fluticasone -salmeterol (ADVAIR HFA) 115-21 MCG/ACT inhaler Inhale 2 puffs into the lungs 2 (two) times daily.   amoxicillin -clavulanate (AUGMENTIN ) 875-125 MG tablet Take 1 tablet by mouth every 12 (twelve) hours.   predniSONE  (DELTASONE ) 5 MG tablet Take 1 tablet (5 mg total) by mouth daily as needed.   No facility-administered encounter medications on file as of 03/11/2024.    Allergies as of 03/11/2024 - Review Complete 03/11/2024  Allergen Reaction Noted   Egg protein-containing drug products Nausea And Vomiting 08/23/2012   Lactose intolerance (gi) Other (See Comments) 01/27/2014    Past Medical History:  Diagnosis Date   Allergy     Asthma 09/2023   GERD (gastroesophageal reflux disease)    does not need meds anymore   Heart murmur    Hyperlipidemia    Lactose intolerance    Leaky heart valve 11/2023   Lupus    Mini stroke  OSA (obstructive sleep apnea)    Positive ANA (antinuclear antibody)    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HCC)     Past Surgical History:  Procedure Laterality Date   ABDOMINAL HYSTERECTOMY  2006   benign tumor, complete   AORTIC ARCH ANGIOGRAPHY N/A 01/19/2023   Procedure: AORTIC ARCH ANGIOGRAPHY;  Surgeon: Verlin Lonni BIRCH, MD;  Location: [facility];  Service: Cardiovascular;  Laterality: N/A;   KNEE ARTHROSCOPY Right    NASAL SEPTUM SURGERY     RIGHT HEART CATH AND CORONARY ANGIOGRAPHY N/A 01/19/2023   Procedure: RIGHT HEART CATH AND  CORONARY ANGIOGRAPHY;  Surgeon: Verlin Lonni BIRCH, MD;  Location: [facility];  Service: Cardiovascular;  Laterality: N/A;   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   TEE WITHOUT CARDIOVERSION N/A 01/19/2023   Procedure: TRANSESOPHAGEAL ECHOCARDIOGRAM;  Surgeon: Delford Maude BROCKS, MD;  Location: [facility];  Service: Cardiovascular;  Laterality: N/A;    Family History  Problem Relation Age of Onset   Arthritis Mother    Hyperlipidemia Mother    Stroke Mother    Hypertension Mother    Hypertension Father    Lupus Maternal Aunt    Breast cancer Daughter    Lupus Son    Colon cancer Neg Hx     Social History   Socioeconomic History   Marital status: Married    Spouse name: Not on file   Number of children: 2   Years of education: Not on file   Highest education level: Not on file  Occupational History   Not on file  Tobacco Use   Smoking status: Never    Passive exposure: Past   Smokeless tobacco: Never  Vaping Use   Vaping status: Never Used  Substance and Sexual Activity   Alcohol use: No   Drug use: No   Sexual activity: Not on file  Other Topics Concern   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  Respiratory:  Positive for apnea.   Psychiatric/Behavioral:  Positive for sleep disturbance.     Vitals:   03/11/24 1532  BP: 126/72   Pulse: 67  SpO2: 98%     Physical Exam Constitutional:      Appearance: She is obese.  HENT:     Head: Normocephalic.     Mouth/Throat:     Mouth: Mucous membranes are moist.  Eyes:     General: No scleral icterus. Cardiovascular:     Rate and Rhythm: Normal rate and regular rhythm.     Heart sounds: No murmur heard.    No friction rub.  Pulmonary:     Effort: No respiratory distress.  Breath sounds: No stridor. No rhonchi.  Musculoskeletal:     Cervical back: No rigidity or tenderness.  Neurological:     Mental Status: She is alert.    Data Reviewed: Download from her CPAP shows poor compliance Average use of 2 hours 59 minutes on days used AutoSet 5-15 95 percentile pressure of 8.5 Residual AHI of 1.8  Diagnostic study did reveal an AHI of 13.7  Pulmonary function test reviewed showing no significant obstruction in the major airways, no significant bronchodilator response, no restriction, normal diffusing capacity  Assessment/Plan: Obstructive sleep apnea - Encouraged to use CPAP on a nightly basis - She does benefit from CPAP use - The need to use it on a regular basis to reduce risks was discussed  Shortness of breath on exertion - Likely multifactorial - I think she will benefit from being on an inhaler around-the-clock - Prescription for Bernida was placed  Deconditioning - Graded exercise as tolerated  Does have a history of lupus  Follow-up in about 3 to 4 months  Encouraged to call with significant concerns  I spent 30 minutes dedicated to the care of this patient on the date of this encounter to include previsit review of records, face-to-face time with the patient discussing conditions above, post visit ordering of testing,ordering medications,independentlyinterpreting results, clinical documentation with electronic health record    Jennet Epley MD [facility] 03/11/2024, 3:55 PM  CC: Mercer Clotilda SAUNDERS, MD

## 2024-03-11 NOTE — Patient Instructions (Signed)
 Follow-up in 3 to 4 months  Continue using your CPAP on a nightly basis - Download from your machine shows it is effective when it is used  Prescription placed for Breyna to be used 2 puffs twice a day - Make sure you are using this on a regular basis - Make sure you rinse your mouth after use  Graded exercises as tolerated  Call us  with significant concerns

## 2024-03-21 ENCOUNTER — Other Ambulatory Visit: Payer: Self-pay | Admitting: Internal Medicine

## 2024-03-21 DIAGNOSIS — I7300 Raynaud's syndrome without gangrene: Secondary | ICD-10-CM

## 2024-03-21 NOTE — Telephone Encounter (Addendum)
 Last Fill: 12/24/2023  Eye exam: 01/10/2023 Normal    Labs: 01/28/2024 Platelets 450 Rest of CBC and CMP WNL  Next Visit: 07/29/2024  Last Visit: 01/28/2024  IK:Lwipqqzmzwupjuzi connective tissue disease   Current Dose per office note 01/28/2024: hydroxychloroquine  400 mg   Okay to refill Plaquenil ?   Contacted [facility], patient had an exam done in August but only the OCT not the visual field and they are going to fax it over. Also contacted patient to advise that we needed the visual field.

## 2024-03-24 ENCOUNTER — Encounter (HOSPITAL_BASED_OUTPATIENT_CLINIC_OR_DEPARTMENT_OTHER): Payer: Self-pay

## 2024-03-24 ENCOUNTER — Ambulatory Visit: Payer: Self-pay | Admitting: Internal Medicine

## 2024-03-24 ENCOUNTER — Other Ambulatory Visit: Payer: Self-pay

## 2024-03-24 ENCOUNTER — Emergency Department (HOSPITAL_BASED_OUTPATIENT_CLINIC_OR_DEPARTMENT_OTHER)
Admission: EM | Admit: 2024-03-24 | Discharge: 2024-03-24 | Disposition: A | Discharge status: Home / Self Care | Attending: Emergency Medicine | Admitting: Emergency Medicine

## 2024-03-24 DIAGNOSIS — J069 Acute upper respiratory infection, unspecified: Secondary | ICD-10-CM | POA: Insufficient documentation

## 2024-03-24 DIAGNOSIS — J45909 Unspecified asthma, uncomplicated: Secondary | ICD-10-CM | POA: Diagnosis not present

## 2024-03-24 DIAGNOSIS — R059 Cough, unspecified: Secondary | ICD-10-CM | POA: Diagnosis present

## 2024-03-24 LAB — RESP PANEL BY RT-PCR (RSV, FLU A&B, COVID)  RVPGX2
Influenza A by PCR: NEGATIVE
Influenza B by PCR: NEGATIVE
Resp Syncytial Virus by PCR: NEGATIVE
SARS Coronavirus 2 by RT PCR: NEGATIVE

## 2024-03-24 MED ORDER — BENZONATATE 100 MG PO CAPS: 100.0000 mg | ORAL_CAPSULE | Freq: Three times a day (TID) | ORAL | 0 refills | Status: AC

## 2024-03-24 NOTE — Discharge Instructions (Addendum)
 Continue Flonase  and nasal rinses for congestion.  You may use over-the-counter cough medications, like Delsym or Robitussin, as needed.  I have prescribed you Tessalon , use 1 capsule by mouth every 8 hours as needed for cough.  Continue Tylenol  as needed for headache or bodyaches.  Continue use of your inhaler as directed for shortness of breath or wheezing.  Follow-up with your PCP as needed.  Return to the emergency department if your symptoms worsen.

## 2024-03-24 NOTE — ED Provider Notes (Signed)
 Deanna [name] [facility] Provider Note   CSN: [id number] Arrival date & time: 03/24/24  9157     Patient presents with: No chief complaint on file.   Deanna [name] is a 63 y.o. female.   63 year old female presenting with cough and congestion.  Patient notes that on Saturday she developed a sore throat, this has since resolved however she now notes nasal congestion and a mild productive cough with green mucus at times.  She denies fever, chest pain, shortness of breath, worsening lower extremity edema, abdominal pain, nausea/vomiting/diarrhea.  Describes her cough as mild but reports that her nasal congestion is a bit more bothersome.  Reports occasional headache but none currently.  History of asthma for which she uses a budesonide/formoterol inhaler daily.  No COPD history.        Prior to Admission medications   Medication Sig Start Date End Date Taking? Authorizing Provider  benzonatate  (TESSALON ) 100 MG capsule Take 1 capsule (100 mg total) by mouth every 8 (eight) hours. 03/24/24  Yes Rip Hawes N, PA-C  Albuterol-Budesonide (AIRSUPRA ) 90-80 MCG/ACT AERO Inhale 2 puffs into the lungs every 4 (four) hours as needed. 03/01/23   Parrett, Madelin RAMAN, NP  amoxicillin -clavulanate (AUGMENTIN ) 875-125 MG tablet Take 1 tablet by mouth every 12 (twelve) hours. 07/07/23   Silver Wonda LABOR, PA  Azelastine -Fluticasone  137-50 MCG/ACT SUSP Place 2 puffs into the nose daily. 03/01/23   Parrett, Madelin RAMAN, NP  azithromycin  (ZITHROMAX ) 250 MG tablet Take 1 tablet (250 mg total) by mouth daily. Take first 2 tablets together, then 1 every day until finished. 07/07/23   Silver Wonda LABOR, PA  budesonide-formoterol (BREYNA) 160-4.5 MCG/ACT inhaler Inhale 2 puffs into the lungs 2 (two) times daily. 03/11/24   Neda Jennet LABOR, MD  Cholecalciferol 50 MCG (2000 UT) TBDP Take 2,000 Units by mouth daily.    [provider]  clopidogrel  (PLAVIX ) 75 MG tablet  Take 1 tablet (75 mg total) by mouth daily. 07/03/23   Georjean Darice HERO, MD  Ferrous Sulfate (IRON PO) Take 65 mg by mouth daily.    [provider]  fexofenadine  (ALLEGRA ) 180 MG tablet TAKE 1 TABLET BY MOUTH EVERY DAY Patient taking differently: Take 180 mg by mouth as needed for allergies. 03/21/21   Mercer Clotilda SAUNDERS, MD  furosemide  (LASIX ) 20 MG tablet TAKE 1 TABLET BY MOUTH EVERY DAY 08/08/23   Nahser, Aleene PARAS, MD  hydroxychloroquine  (PLAQUENIL ) 200 MG tablet TAKE 2 TABLETS BY MOUTH EVERY DAY 03/24/24   Rice, Lonni ORN, MD  lidocaine  (XYLOCAINE ) 2 % solution Use as directed 15 mLs in the mouth or throat as needed for mouth pain. 07/05/23   Theotis Peers M, PA-C  naproxen sodium (ALEVE) 220 MG tablet Take 440 mg by mouth daily as needed (Headache).    [provider]  nitroGLYCERIN  (NITROSTAT ) 0.4 MG SL tablet Place 1 tablet (0.4 mg total) under the tongue every 5 (five) minutes as needed for chest pain. 01/11/23   Nahser, Aleene PARAS, MD  potassium chloride  (KLOR-CON ) 10 MEQ tablet Take 1 tablet (10 mEq total) by mouth 2 (two) times daily. 01/26/23   Nahser, Aleene PARAS, MD    Allergies: Egg protein-containing drug products and Lactose intolerance (gi)    Review of Systems  HENT:  Positive for postnasal drip.     Updated Vital Signs  Vitals:   03/24/24 0848 03/24/24 0850  BP:  (!) 144/81  Pulse:  68  Resp:  16  Temp:  97.9 F (36.6 C)  TempSrc:  Oral  SpO2:  100%  Weight: 106.1 kg   Height: 5' 9 (1.753 m)      Physical Exam Vitals and nursing note reviewed.  HENT:     Head: Normocephalic.     Comments: No tenderness to palpation of the frontal or maxillary sinuses    Nose: Congestion present.     Mouth/Throat:     Mouth: Mucous membranes are moist.     Pharynx: Oropharynx is clear. Uvula midline. Postnasal drip present. No pharyngeal swelling, oropharyngeal exudate or posterior oropharyngeal erythema.     Tonsils: No tonsillar exudate or tonsillar  abscesses.  Eyes:     Extraocular Movements: Extraocular movements intact.  Cardiovascular:     Rate and Rhythm: Normal rate and regular rhythm.  Pulmonary:     Effort: Pulmonary effort is normal. No respiratory distress.     Breath sounds: Normal breath sounds. No wheezing, rhonchi or rales.     Comments: Speaking in full/clear sentences without difficulty Musculoskeletal:     Cervical back: Normal range of motion.     Right lower leg: No edema.     Left lower leg: No edema.     Comments: Moves all extremities spontaneously without difficulty  Skin:    General: Skin is warm and dry.  Neurological:     Mental Status: She is alert and oriented to person, place, and time.     (all labs ordered are listed, but only abnormal results are displayed) Labs Reviewed  RESP PANEL BY RT-PCR (RSV, FLU A&B, COVID)  RVPGX2    EKG: None  Radiology: No results found.   Procedures   Medications Ordered in the ED - No data to display                                  Medical Decision Making This patient presents to the ED for concern of cough/congestion, this involves an extensive number of treatment options, and is a complaint that carries with it a high risk of complications and morbidity.  The differential diagnosis includes COVID/flu/RSV, other viral URI, pneumonia, asthma exacerbation   Co morbidities that complicate the patient evaluation  History of asthma on Symbicort   Additional history obtained:  Additional history obtained from record review External records from outside source obtained and reviewed including recent pulmonology note    Lab Tests:  I Ordered, and personally interpreted labs.  The pertinent results include:  COVID/flu/RSV negative    Cardiac Monitoring: / EKG:  The patient was maintained on a cardiac monitor.  I personally viewed and interpreted the cardiac monitored which showed an underlying rhythm of: NSR    Problem List / ED Course /  Critical interventions / Medication management I have reviewed the patients home medicines and have made adjustments as needed   Test / Admission - Considered:  Physical exam is reassuring as above, lungs are clear to auscultation bilaterally without adventitious sounds, no evidence of respiratory distress, no wheezing suggestive of an asthma exacerbation.  Patient has been symptomatic for approximately 2 days, she does endorse a mild productive cough however does not exhibit any coughing during my assessment, I have a low suspicion for pneumonia given her reassuring physical exam findings as well as lack of fever, I did not proceed with a chest x-ray given this.  Patient does have some nasal congestion and postnasal drip, I  suspect her symptoms are most aligned with a viral upper respiratory infection.  I recommend that she continue supportive measures, like Flonase  or nasal saline rinses for congestion, Delsym or Robitussin for cough, and Tylenol  as needed for headache or bodyaches.  Will prescribe Tessalon  to be used as needed for cough, as she mentions that her cough tends to be worse before bed.  Return precautions discussed, I recommend she follow-up with her PCP as needed.  She voiced understanding and is in agreement with this plan.  She is appropriate for discharge at this time.    Risk Prescription drug management.        Final diagnoses:  Viral URI with cough    ED Discharge Orders          Ordered    benzonatate  (TESSALON ) 100 MG capsule  Every 8 hours        03/24/24 0936               Glendia Rocky SAILOR, PA-C 03/24/24 0945    Freddi Glendia, MD 03/24/24 1402

## 2024-03-24 NOTE — ED Triage Notes (Signed)
 C/o sore throat Saturday, resolved. C/o sinus pressure x 2 days with cough. Hx of sinus infections, feels like the start of one.

## 2024-05-30 ENCOUNTER — Emergency Department (HOSPITAL_BASED_OUTPATIENT_CLINIC_OR_DEPARTMENT_OTHER)
Admission: EM | Admit: 2024-05-30 | Discharge: 2024-05-30 | Disposition: A | Discharge status: Home / Self Care | Attending: Emergency Medicine | Admitting: Emergency Medicine

## 2024-05-30 ENCOUNTER — Encounter (HOSPITAL_BASED_OUTPATIENT_CLINIC_OR_DEPARTMENT_OTHER): Payer: Self-pay | Admitting: Emergency Medicine

## 2024-05-30 ENCOUNTER — Other Ambulatory Visit: Payer: Self-pay

## 2024-05-30 DIAGNOSIS — J988 Other specified respiratory disorders: Secondary | ICD-10-CM | POA: Insufficient documentation

## 2024-05-30 DIAGNOSIS — Z7901 Long term (current) use of anticoagulants: Secondary | ICD-10-CM | POA: Diagnosis not present

## 2024-05-30 DIAGNOSIS — B9789 Other viral agents as the cause of diseases classified elsewhere: Secondary | ICD-10-CM | POA: Diagnosis not present

## 2024-05-30 DIAGNOSIS — R059 Cough, unspecified: Secondary | ICD-10-CM | POA: Diagnosis present

## 2024-05-30 LAB — RESP PANEL BY RT-PCR (RSV, FLU A&B, COVID)  RVPGX2
Influenza A by PCR: NEGATIVE
Influenza B by PCR: NEGATIVE
Resp Syncytial Virus by PCR: NEGATIVE
SARS Coronavirus 2 by RT PCR: NEGATIVE

## 2024-05-30 MED ORDER — AMOXICILLIN 500 MG PO CAPS: 500.0000 mg | ORAL_CAPSULE | Freq: Three times a day (TID) | ORAL | 0 refills | Status: AC

## 2024-05-30 NOTE — ED Notes (Signed)

## 2024-05-30 NOTE — Discharge Instructions (Addendum)
 If you are having persistent fever and frontal headaches after another 7 days, you can start the antibiotic for possible sinusitis.  I do not think you need to start the antibiotic now.  Most sinus infections are from viruses and improve after 1 week.  If your influenza or viral testing is POSITIVE, you should NOT fill or take the antibiotic prescribed.  You will treat this illness supportively with over the counter cold-and-flu medications.  Most patients are sick with flu for 5-7 days.

## 2024-05-30 NOTE — ED Provider Notes (Signed)
 " [place] [facility] Provider Note   CSN: [id number] Arrival date & time: 05/30/24  9046     Patient presents with: Nasal Congestion   Deanna [name] is a 64 y.o. female presenting to the ER with 3 days of cough, congestion, headache.  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  She does have a history of asthma but does not feel that she is wheezing.  She does have an inhaler at home.  She said her biggest complaint is last night she had a bad headache while she was trying to sleep.  She does have a history of sinus infections and wonders whether she is developing another sinus infection   HPI     Prior to Admission medications  Medication Sig Start Date End Date Taking? Authorizing Provider  amoxicillin  (AMOXIL ) 500 MG capsule Take 1 capsule (500 mg total) by mouth 3 (three) times daily for 7 days. 05/30/24 06/06/24 Yes Phaedra Colgate, Donnice PARAS, MD  Albuterol-Budesonide  (AIRSUPRA ) 90-80 MCG/ACT AERO Inhale 2 puffs into the lungs every 4 (four) hours as needed. 03/01/23   Parrett, Madelin RAMAN, NP  amoxicillin -clavulanate (AUGMENTIN ) 875-125 MG tablet Take 1 tablet by mouth every 12 (twelve) hours. 07/07/23   Silver Wonda LABOR, PA  Azelastine -Fluticasone  137-50 MCG/ACT SUSP Place 2 puffs into the nose daily. 03/01/23   Parrett, Madelin RAMAN, NP  azithromycin  (ZITHROMAX ) 250 MG tablet Take 1 tablet (250 mg total) by mouth daily. Take first 2 tablets together, then 1 every day until finished. 07/07/23   Silver Wonda LABOR, PA  benzonatate  (TESSALON ) 100 MG capsule Take 1 capsule (100 mg total) by mouth every 8 (eight) hours. 03/24/24   Scott, Rocky SAILOR, PA-C  budesonide -formoterol  (BREYNA ) 160-4.5 MCG/ACT inhaler Inhale 2 puffs into the lungs 2 (two) times daily. 03/11/24   Neda Jennet LABOR, MD  Cholecalciferol 50 MCG (2000 UT) TBDP Take 2,000 Units by mouth daily.    [provider]  clopidogrel  (PLAVIX )  75 MG tablet Take 1 tablet (75 mg total) by mouth daily. 07/03/23   Georjean Darice HERO, MD  Ferrous Sulfate (IRON PO) Take 65 mg by mouth daily.    [provider]  fexofenadine  (ALLEGRA ) 180 MG tablet TAKE 1 TABLET BY MOUTH EVERY DAY Patient taking differently: Take 180 mg by mouth as needed for allergies. 03/21/21   Mercer Clotilda SAUNDERS, MD  furosemide  (LASIX ) 20 MG tablet TAKE 1 TABLET BY MOUTH EVERY DAY 08/08/23   Nahser, Aleene PARAS, MD  hydroxychloroquine  (PLAQUENIL ) 200 MG tablet TAKE 2 TABLETS BY MOUTH EVERY DAY 03/24/24   Rice, Lonni ORN, MD  lidocaine  (XYLOCAINE ) 2 % solution Use as directed 15 mLs in the mouth or throat as needed for mouth pain. 07/05/23   Theotis Peers M, PA-C  naproxen sodium (ALEVE) 220 MG tablet Take 440 mg by mouth daily as needed (Headache).    [provider]  nitroGLYCERIN  (NITROSTAT ) 0.4 MG SL tablet Place 1 tablet (0.4 mg total) under the tongue every 5 (five) minutes as needed for chest pain. 01/11/23   Nahser, Aleene PARAS, MD  potassium chloride  (KLOR-CON ) 10 MEQ tablet Take 1 tablet (10 mEq total) by mouth 2 (two) times daily. 01/26/23   Nahser, Aleene PARAS, MD    Allergies: Egg protein-containing drug products and Lactose intolerance (gi)    Review of Systems  Updated Vital Signs BP 134/74   Pulse 74   Temp 98.1 F (36.7  C)   Resp 16   Wt 104.3 kg   SpO2 99%   BMI 33.97 kg/m   Physical Exam Constitutional:      General: She is not in acute distress. HENT:     Head: Normocephalic and atraumatic.  Eyes:     Conjunctiva/sclera: Conjunctivae normal.     Pupils: Pupils are equal, round, and reactive to light.  Cardiovascular:     Rate and Rhythm: Normal rate and regular rhythm.  Pulmonary:     Effort: Pulmonary effort is normal. No respiratory distress.     Breath sounds: Normal breath sounds. No wheezing or rales.  Skin:    General: Skin is warm and dry.  Neurological:     General: No focal deficit present.     Mental Status: She is  alert. Mental status is at baseline.  Psychiatric:        Mood and Affect: Mood normal.        Behavior: Behavior normal.     (all labs ordered are listed, but only abnormal results are displayed) Labs Reviewed  RESP PANEL BY RT-PCR (RSV, FLU A&B, COVID)  RVPGX2    EKG: None  Radiology: No results found.   Procedures   Medications Ordered in the ED - No data to display                                  Medical Decision Making Risk Prescription drug management.   Patient is here with constellation of symptoms most likely consistent with a viral illness.  Because of her history of asthma, I do think that viral testing for influenza would be reasonable.  Patient will follow-up on those results at home.  We discussed Tamiflu but she does not want this medication, given concern for side effects.  At this point she has no respiratory distress, has no audible wheezing, no indication for asthma exacerbation management or x-ray imaging.  She can continue conservatively at home.  We did have an ongoing discussion about her headache, which I suspect is very likely viral.  She is concerned about her history of sinusitis.  I did give her a watch and wait prescription for amoxicillin , but advised that she does not need to start this medication unless she has persistent headaches or fevers for 7 more days.  If she test positive for any of the viruses, she does not need the antibiotic.  She verbalized understanding     Final diagnoses:  Viral respiratory infection    ED Discharge Orders          Ordered    amoxicillin  (AMOXIL ) 500 MG capsule  3 times daily        05/30/24 1021               Cottie Donnice PARAS, MD 05/30/24 1027  "

## 2024-05-30 NOTE — ED Triage Notes (Signed)
 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 Hx asthma .  Adds coughing .

## 2024-05-31 ENCOUNTER — Encounter: Payer: Self-pay | Admitting: Internal Medicine

## 2024-06-02 ENCOUNTER — Encounter: Payer: Self-pay | Admitting: Internal Medicine

## 2024-06-23 ENCOUNTER — Ambulatory Visit: Admitting: Pulmonary Disease

## 2024-07-29 ENCOUNTER — Ambulatory Visit: Admitting: Internal Medicine

## 2024-08-08 ENCOUNTER — Ambulatory Visit: Admitting: Internal Medicine
